# Patient Record
Sex: Female | Born: 1955
Health system: Southern US, Community
[De-identification: ages and names within clinical notes are randomized; demographics above are authoritative.]

## PROBLEM LIST (undated history)

## (undated) DIAGNOSIS — M109 Gout, unspecified: Secondary | ICD-10-CM

## (undated) DIAGNOSIS — I639 Cerebral infarction, unspecified: Secondary | ICD-10-CM

## (undated) DIAGNOSIS — M25569 Pain in unspecified knee: Secondary | ICD-10-CM

## (undated) DIAGNOSIS — F1910 Other psychoactive substance abuse, uncomplicated: Secondary | ICD-10-CM

## (undated) DIAGNOSIS — D649 Anemia, unspecified: Secondary | ICD-10-CM

## (undated) DIAGNOSIS — I10 Essential (primary) hypertension: Secondary | ICD-10-CM

## (undated) DIAGNOSIS — M1990 Unspecified osteoarthritis, unspecified site: Secondary | ICD-10-CM

## (undated) DIAGNOSIS — N289 Disorder of kidney and ureter, unspecified: Secondary | ICD-10-CM

## (undated) DIAGNOSIS — M25559 Pain in unspecified hip: Secondary | ICD-10-CM

## (undated) DIAGNOSIS — F329 Major depressive disorder, single episode, unspecified: Secondary | ICD-10-CM

## (undated) DIAGNOSIS — F32A Depression, unspecified: Secondary | ICD-10-CM

## (undated) DIAGNOSIS — R768 Other specified abnormal immunological findings in serum: Secondary | ICD-10-CM

## (undated) DIAGNOSIS — R296 Repeated falls: Secondary | ICD-10-CM

## (undated) DIAGNOSIS — F419 Anxiety disorder, unspecified: Secondary | ICD-10-CM

## (undated) DIAGNOSIS — K219 Gastro-esophageal reflux disease without esophagitis: Secondary | ICD-10-CM

## (undated) DIAGNOSIS — G8929 Other chronic pain: Secondary | ICD-10-CM

## (undated) HISTORY — PX: ABDOMINAL HYSTERECTOMY: SHX81

## (undated) HISTORY — PX: X-STOP IMPLANTATION: SHX2677

## (undated) HISTORY — PX: PARTIAL KNEE ARTHROPLASTY: SHX2174

## (undated) HISTORY — PX: TOTAL KNEE ARTHROPLASTY: SHX125

## (undated) HISTORY — PX: KNEE ARTHROSCOPY: SHX127

## (undated) HISTORY — PX: OTHER SURGICAL HISTORY: SHX169

---

## 1999-01-02 ENCOUNTER — Encounter: Payer: Self-pay | Admitting: Internal Medicine

## 1999-01-02 ENCOUNTER — Ambulatory Visit (HOSPITAL_COMMUNITY): Admission: RE | Admit: 1999-01-02 | Discharge: 1999-01-02 | Payer: Self-pay | Admitting: Internal Medicine

## 1999-01-11 ENCOUNTER — Ambulatory Visit (HOSPITAL_COMMUNITY): Admission: RE | Admit: 1999-01-11 | Discharge: 1999-01-11 | Payer: Self-pay | Admitting: *Deleted

## 1999-01-26 ENCOUNTER — Emergency Department (HOSPITAL_COMMUNITY): Admission: EM | Admit: 1999-01-26 | Discharge: 1999-01-26 | Payer: Self-pay | Admitting: Emergency Medicine

## 1999-03-25 ENCOUNTER — Emergency Department (HOSPITAL_COMMUNITY): Admission: EM | Admit: 1999-03-25 | Discharge: 1999-03-25 | Payer: Self-pay | Admitting: Emergency Medicine

## 1999-03-25 ENCOUNTER — Encounter: Payer: Self-pay | Admitting: Emergency Medicine

## 2002-01-05 ENCOUNTER — Emergency Department (HOSPITAL_COMMUNITY): Admission: EM | Admit: 2002-01-05 | Discharge: 2002-01-05 | Payer: Self-pay | Admitting: Internal Medicine

## 2002-04-04 ENCOUNTER — Emergency Department (HOSPITAL_COMMUNITY): Admission: EM | Admit: 2002-04-04 | Discharge: 2002-04-04 | Payer: Self-pay | Admitting: Emergency Medicine

## 2002-11-26 ENCOUNTER — Emergency Department (HOSPITAL_COMMUNITY): Admission: EM | Admit: 2002-11-26 | Discharge: 2002-11-26 | Payer: Self-pay | Admitting: Emergency Medicine

## 2003-02-06 ENCOUNTER — Emergency Department (HOSPITAL_COMMUNITY): Admission: EM | Admit: 2003-02-06 | Discharge: 2003-02-06 | Payer: Self-pay | Admitting: Emergency Medicine

## 2003-05-26 ENCOUNTER — Emergency Department (HOSPITAL_COMMUNITY): Admission: EM | Admit: 2003-05-26 | Discharge: 2003-05-26 | Payer: Self-pay | Admitting: *Deleted

## 2003-05-26 ENCOUNTER — Encounter: Payer: Self-pay | Admitting: *Deleted

## 2003-07-04 ENCOUNTER — Emergency Department (HOSPITAL_COMMUNITY): Admission: EM | Admit: 2003-07-04 | Discharge: 2003-07-04 | Payer: Self-pay

## 2004-08-08 ENCOUNTER — Emergency Department (HOSPITAL_COMMUNITY): Admission: EM | Admit: 2004-08-08 | Discharge: 2004-08-08 | Payer: Self-pay | Admitting: Emergency Medicine

## 2004-11-17 HISTORY — PX: OTHER SURGICAL HISTORY: SHX169

## 2004-12-25 ENCOUNTER — Emergency Department (HOSPITAL_COMMUNITY): Admission: EM | Admit: 2004-12-25 | Discharge: 2004-12-25 | Payer: Self-pay | Admitting: Emergency Medicine

## 2005-01-10 ENCOUNTER — Ambulatory Visit (HOSPITAL_COMMUNITY): Admission: RE | Admit: 2005-01-10 | Discharge: 2005-01-10 | Payer: Self-pay | Admitting: Orthopaedic Surgery

## 2005-05-07 ENCOUNTER — Ambulatory Visit (HOSPITAL_COMMUNITY): Admission: RE | Admit: 2005-05-07 | Discharge: 2005-05-07 | Payer: Self-pay | Admitting: Family Medicine

## 2005-06-04 ENCOUNTER — Ambulatory Visit (HOSPITAL_COMMUNITY): Admission: RE | Admit: 2005-06-04 | Discharge: 2005-06-04 | Payer: Self-pay | Admitting: Obstetrics and Gynecology

## 2005-06-16 ENCOUNTER — Emergency Department (HOSPITAL_COMMUNITY): Admission: EM | Admit: 2005-06-16 | Discharge: 2005-06-16 | Payer: Self-pay | Admitting: Emergency Medicine

## 2005-06-30 ENCOUNTER — Encounter: Payer: Self-pay | Admitting: Obstetrics and Gynecology

## 2005-06-30 ENCOUNTER — Inpatient Hospital Stay (HOSPITAL_COMMUNITY): Admission: RE | Admit: 2005-06-30 | Discharge: 2005-07-02 | Payer: Self-pay | Admitting: Obstetrics and Gynecology

## 2005-07-29 ENCOUNTER — Ambulatory Visit (HOSPITAL_COMMUNITY): Admission: RE | Admit: 2005-07-29 | Discharge: 2005-07-29 | Payer: Self-pay | Admitting: Orthopaedic Surgery

## 2005-07-29 ENCOUNTER — Emergency Department (HOSPITAL_COMMUNITY): Admission: EM | Admit: 2005-07-29 | Discharge: 2005-07-29 | Payer: Self-pay | Admitting: Emergency Medicine

## 2006-01-16 ENCOUNTER — Ambulatory Visit (HOSPITAL_COMMUNITY): Admission: RE | Admit: 2006-01-16 | Discharge: 2006-01-16 | Payer: Self-pay | Admitting: Orthopaedic Surgery

## 2006-04-04 ENCOUNTER — Emergency Department (HOSPITAL_COMMUNITY): Admission: EM | Admit: 2006-04-04 | Discharge: 2006-04-04 | Payer: Self-pay | Admitting: Emergency Medicine

## 2006-07-25 ENCOUNTER — Emergency Department (HOSPITAL_COMMUNITY): Admission: EM | Admit: 2006-07-25 | Discharge: 2006-07-25 | Payer: Self-pay | Admitting: Emergency Medicine

## 2006-08-04 ENCOUNTER — Ambulatory Visit (HOSPITAL_COMMUNITY): Admission: RE | Admit: 2006-08-04 | Discharge: 2006-08-04 | Payer: Self-pay | Admitting: Orthopaedic Surgery

## 2006-09-02 ENCOUNTER — Emergency Department (HOSPITAL_COMMUNITY): Admission: EM | Admit: 2006-09-02 | Discharge: 2006-09-02 | Payer: Self-pay | Admitting: Emergency Medicine

## 2006-10-07 ENCOUNTER — Emergency Department (HOSPITAL_COMMUNITY): Admission: EM | Admit: 2006-10-07 | Discharge: 2006-10-07 | Payer: Self-pay | Admitting: Emergency Medicine

## 2006-12-23 ENCOUNTER — Emergency Department (HOSPITAL_COMMUNITY): Admission: EM | Admit: 2006-12-23 | Discharge: 2006-12-23 | Payer: Self-pay | Admitting: Emergency Medicine

## 2007-02-11 ENCOUNTER — Emergency Department (HOSPITAL_COMMUNITY): Admission: EM | Admit: 2007-02-11 | Discharge: 2007-02-11 | Payer: Self-pay | Admitting: Emergency Medicine

## 2007-02-22 ENCOUNTER — Ambulatory Visit (HOSPITAL_COMMUNITY): Admission: RE | Admit: 2007-02-22 | Discharge: 2007-02-22 | Payer: Self-pay | Admitting: Orthopaedic Surgery

## 2007-03-10 ENCOUNTER — Encounter (INDEPENDENT_AMBULATORY_CARE_PROVIDER_SITE_OTHER): Payer: Self-pay | Admitting: Specialist

## 2007-03-11 ENCOUNTER — Ambulatory Visit (HOSPITAL_COMMUNITY): Admission: RE | Admit: 2007-03-11 | Discharge: 2007-03-11 | Payer: Self-pay | Admitting: Orthopaedic Surgery

## 2007-03-15 ENCOUNTER — Encounter (HOSPITAL_COMMUNITY): Admission: RE | Admit: 2007-03-15 | Discharge: 2007-04-14 | Payer: Self-pay | Admitting: Orthopaedic Surgery

## 2007-04-28 ENCOUNTER — Ambulatory Visit (HOSPITAL_COMMUNITY): Admission: RE | Admit: 2007-04-28 | Discharge: 2007-04-28 | Payer: Self-pay | Admitting: Orthopaedic Surgery

## 2007-05-08 ENCOUNTER — Emergency Department (HOSPITAL_COMMUNITY): Admission: EM | Admit: 2007-05-08 | Discharge: 2007-05-08 | Payer: Self-pay | Admitting: Emergency Medicine

## 2007-06-29 ENCOUNTER — Ambulatory Visit: Payer: Self-pay | Admitting: Family Medicine

## 2007-06-29 DIAGNOSIS — F329 Major depressive disorder, single episode, unspecified: Secondary | ICD-10-CM

## 2007-06-29 DIAGNOSIS — M129 Arthropathy, unspecified: Secondary | ICD-10-CM | POA: Insufficient documentation

## 2007-06-29 DIAGNOSIS — R5383 Other fatigue: Secondary | ICD-10-CM

## 2007-06-29 DIAGNOSIS — F32A Depression, unspecified: Secondary | ICD-10-CM | POA: Insufficient documentation

## 2007-06-29 DIAGNOSIS — F411 Generalized anxiety disorder: Secondary | ICD-10-CM | POA: Insufficient documentation

## 2007-06-29 DIAGNOSIS — I10 Essential (primary) hypertension: Secondary | ICD-10-CM | POA: Insufficient documentation

## 2007-06-29 DIAGNOSIS — K5900 Constipation, unspecified: Secondary | ICD-10-CM | POA: Insufficient documentation

## 2007-06-29 DIAGNOSIS — R5381 Other malaise: Secondary | ICD-10-CM

## 2007-06-30 ENCOUNTER — Telehealth (INDEPENDENT_AMBULATORY_CARE_PROVIDER_SITE_OTHER): Payer: Self-pay | Admitting: *Deleted

## 2007-06-30 LAB — CONVERTED CEMR LAB
ALT: 15 units/L (ref 0–35)
Albumin: 3.9 g/dL (ref 3.5–5.2)
Basophils Absolute: 0 10*3/uL (ref 0.0–0.1)
CO2: 23 meq/L (ref 19–32)
Calcium: 9.1 mg/dL (ref 8.4–10.5)
Chloride: 106 meq/L (ref 96–112)
Cholesterol: 152 mg/dL (ref 0–200)
Glucose, Bld: 98 mg/dL (ref 70–99)
HCT: 34.2 % — ABNORMAL LOW (ref 36.0–46.0)
Hemoglobin: 11 g/dL — ABNORMAL LOW (ref 12.0–15.0)
Lymphocytes Relative: 29 % (ref 12–46)
Lymphs Abs: 3.1 10*3/uL (ref 0.7–3.3)
Neutro Abs: 6.7 10*3/uL (ref 1.7–7.7)
Platelets: 255 10*3/uL (ref 150–400)
RDW: 14.5 % — ABNORMAL HIGH (ref 11.5–14.0)
Sodium: 141 meq/L (ref 135–145)
Total Protein: 7.6 g/dL (ref 6.0–8.3)
Triglycerides: 71 mg/dL (ref <150)
WBC: 10.8 10*3/uL — ABNORMAL HIGH (ref 4.0–10.5)

## 2007-07-02 ENCOUNTER — Encounter (INDEPENDENT_AMBULATORY_CARE_PROVIDER_SITE_OTHER): Payer: Self-pay | Admitting: Family Medicine

## 2007-07-07 ENCOUNTER — Telehealth (INDEPENDENT_AMBULATORY_CARE_PROVIDER_SITE_OTHER): Payer: Self-pay | Admitting: Family Medicine

## 2007-07-13 ENCOUNTER — Ambulatory Visit: Payer: Self-pay | Admitting: Family Medicine

## 2007-07-13 DIAGNOSIS — N182 Chronic kidney disease, stage 2 (mild): Secondary | ICD-10-CM | POA: Insufficient documentation

## 2007-07-13 DIAGNOSIS — F1010 Alcohol abuse, uncomplicated: Secondary | ICD-10-CM | POA: Insufficient documentation

## 2007-07-13 DIAGNOSIS — D649 Anemia, unspecified: Secondary | ICD-10-CM | POA: Insufficient documentation

## 2007-07-13 LAB — CONVERTED CEMR LAB
Cholesterol, target level: 200 mg/dL
HDL goal, serum: 40 mg/dL
LDL Goal: 160 mg/dL

## 2007-07-14 ENCOUNTER — Telehealth (INDEPENDENT_AMBULATORY_CARE_PROVIDER_SITE_OTHER): Payer: Self-pay | Admitting: *Deleted

## 2007-07-14 LAB — CONVERTED CEMR LAB
Basophils Absolute: 0 10*3/uL (ref 0.0–0.1)
Basophils Relative: 0 % (ref 0–1)
CO2: 23 meq/L (ref 19–32)
Calcium: 9.3 mg/dL (ref 8.4–10.5)
Creatinine, Ser: 1.49 mg/dL — ABNORMAL HIGH (ref 0.40–1.20)
Eosinophils Absolute: 0.2 10*3/uL (ref 0.0–0.7)
Ferritin: 180 ng/mL (ref 10–291)
Glucose, Bld: 93 mg/dL (ref 70–99)
Iron: 66 ug/dL (ref 42–145)
MCHC: 33.3 g/dL (ref 30.0–36.0)
MCV: 84.9 fL (ref 78.0–100.0)
Monocytes Relative: 7 % (ref 3–11)
Neutrophils Relative %: 60 % (ref 43–77)
Platelets: 207 10*3/uL (ref 150–400)
RBC: 3.92 M/uL (ref 3.87–5.11)
RDW: 14.5 % — ABNORMAL HIGH (ref 11.5–14.0)
Retic Count, Absolute: 62.7 (ref 19.0–186.0)
UIBC: 185 ug/dL

## 2007-07-15 ENCOUNTER — Encounter (INDEPENDENT_AMBULATORY_CARE_PROVIDER_SITE_OTHER): Payer: Self-pay | Admitting: Family Medicine

## 2007-07-24 ENCOUNTER — Emergency Department (HOSPITAL_COMMUNITY): Admission: EM | Admit: 2007-07-24 | Discharge: 2007-07-24 | Payer: Self-pay | Admitting: Emergency Medicine

## 2007-08-03 ENCOUNTER — Emergency Department (HOSPITAL_COMMUNITY): Admission: EM | Admit: 2007-08-03 | Discharge: 2007-08-03 | Payer: Self-pay | Admitting: Emergency Medicine

## 2007-08-27 ENCOUNTER — Encounter (INDEPENDENT_AMBULATORY_CARE_PROVIDER_SITE_OTHER): Payer: Self-pay | Admitting: Family Medicine

## 2007-11-06 ENCOUNTER — Emergency Department (HOSPITAL_COMMUNITY): Admission: EM | Admit: 2007-11-06 | Discharge: 2007-11-06 | Payer: Self-pay | Admitting: Emergency Medicine

## 2007-11-19 ENCOUNTER — Emergency Department (HOSPITAL_COMMUNITY): Admission: EM | Admit: 2007-11-19 | Discharge: 2007-11-19 | Payer: Self-pay | Admitting: Emergency Medicine

## 2007-12-03 ENCOUNTER — Ambulatory Visit: Payer: Self-pay | Admitting: Family Medicine

## 2007-12-03 ENCOUNTER — Telehealth (INDEPENDENT_AMBULATORY_CARE_PROVIDER_SITE_OTHER): Payer: Self-pay | Admitting: *Deleted

## 2007-12-03 DIAGNOSIS — R1310 Dysphagia, unspecified: Secondary | ICD-10-CM | POA: Insufficient documentation

## 2007-12-06 ENCOUNTER — Encounter (INDEPENDENT_AMBULATORY_CARE_PROVIDER_SITE_OTHER): Payer: Self-pay | Admitting: Family Medicine

## 2007-12-17 ENCOUNTER — Ambulatory Visit: Payer: Self-pay | Admitting: Family Medicine

## 2007-12-17 DIAGNOSIS — E669 Obesity, unspecified: Secondary | ICD-10-CM | POA: Insufficient documentation

## 2007-12-20 ENCOUNTER — Encounter (INDEPENDENT_AMBULATORY_CARE_PROVIDER_SITE_OTHER): Payer: Self-pay | Admitting: Family Medicine

## 2007-12-22 ENCOUNTER — Ambulatory Visit: Payer: Self-pay | Admitting: Gastroenterology

## 2008-02-15 ENCOUNTER — Emergency Department (HOSPITAL_COMMUNITY): Admission: EM | Admit: 2008-02-15 | Discharge: 2008-02-15 | Payer: Self-pay | Admitting: Emergency Medicine

## 2008-05-01 ENCOUNTER — Emergency Department (HOSPITAL_COMMUNITY): Admission: EM | Admit: 2008-05-01 | Discharge: 2008-05-01 | Payer: Self-pay | Admitting: Emergency Medicine

## 2008-05-01 ENCOUNTER — Encounter (INDEPENDENT_AMBULATORY_CARE_PROVIDER_SITE_OTHER): Payer: Self-pay | Admitting: Family Medicine

## 2008-06-06 ENCOUNTER — Emergency Department (HOSPITAL_COMMUNITY): Admission: EM | Admit: 2008-06-06 | Discharge: 2008-06-06 | Payer: Self-pay | Admitting: Emergency Medicine

## 2008-07-31 ENCOUNTER — Telehealth (INDEPENDENT_AMBULATORY_CARE_PROVIDER_SITE_OTHER): Payer: Self-pay | Admitting: Family Medicine

## 2008-10-16 ENCOUNTER — Ambulatory Visit: Payer: Self-pay | Admitting: Family Medicine

## 2008-10-16 LAB — CONVERTED CEMR LAB

## 2008-10-17 LAB — CONVERTED CEMR LAB
ALT: 16 units/L (ref 0–35)
AST: 15 units/L (ref 0–37)
Alkaline Phosphatase: 78 units/L (ref 39–117)
Basophils Absolute: 0.1 10*3/uL (ref 0.0–0.1)
Basophils Relative: 1 % (ref 0–1)
Creatinine, Ser: 1.67 mg/dL — ABNORMAL HIGH (ref 0.40–1.20)
Eosinophils Relative: 2 % (ref 0–5)
HCT: 37.4 % (ref 36.0–46.0)
Hemoglobin: 11.7 g/dL — ABNORMAL LOW (ref 12.0–15.0)
MCHC: 31.3 g/dL (ref 30.0–36.0)
Monocytes Absolute: 0.6 10*3/uL (ref 0.1–1.0)
RDW: 14.3 % (ref 11.5–15.5)
TSH: 2.648 microintl units/mL (ref 0.350–4.50)
Total Bilirubin: 0.6 mg/dL (ref 0.3–1.2)
Total CHOL/HDL Ratio: 2.1
VLDL: 13 mg/dL (ref 0–40)

## 2008-10-27 ENCOUNTER — Encounter (INDEPENDENT_AMBULATORY_CARE_PROVIDER_SITE_OTHER): Payer: Self-pay | Admitting: Family Medicine

## 2008-10-30 ENCOUNTER — Ambulatory Visit: Payer: Self-pay | Admitting: Family Medicine

## 2008-10-31 LAB — CONVERTED CEMR LAB
Iron: 75 ug/dL (ref 42–145)
Retic Ct Pct: 1.2 % (ref 0.4–3.1)
Saturation Ratios: 28 % (ref 20–55)
TIBC: 267 ug/dL (ref 250–470)
UIBC: 192 ug/dL

## 2008-11-02 ENCOUNTER — Encounter (INDEPENDENT_AMBULATORY_CARE_PROVIDER_SITE_OTHER): Payer: Self-pay | Admitting: Family Medicine

## 2008-11-06 ENCOUNTER — Ambulatory Visit (HOSPITAL_COMMUNITY): Admission: RE | Admit: 2008-11-06 | Discharge: 2008-11-06 | Payer: Self-pay | Admitting: Family Medicine

## 2008-11-27 ENCOUNTER — Ambulatory Visit: Payer: Self-pay | Admitting: Family Medicine

## 2008-12-07 ENCOUNTER — Telehealth (INDEPENDENT_AMBULATORY_CARE_PROVIDER_SITE_OTHER): Payer: Self-pay | Admitting: *Deleted

## 2008-12-08 ENCOUNTER — Ambulatory Visit: Payer: Self-pay | Admitting: Family Medicine

## 2008-12-08 ENCOUNTER — Ambulatory Visit (HOSPITAL_COMMUNITY): Admission: RE | Admit: 2008-12-08 | Discharge: 2008-12-08 | Payer: Self-pay | Admitting: Family Medicine

## 2009-01-15 ENCOUNTER — Encounter (INDEPENDENT_AMBULATORY_CARE_PROVIDER_SITE_OTHER): Payer: Self-pay | Admitting: Family Medicine

## 2009-05-28 ENCOUNTER — Ambulatory Visit: Payer: Self-pay | Admitting: Family Medicine

## 2009-05-28 DIAGNOSIS — M79609 Pain in unspecified limb: Secondary | ICD-10-CM

## 2009-05-29 ENCOUNTER — Ambulatory Visit (HOSPITAL_COMMUNITY): Admission: RE | Admit: 2009-05-29 | Discharge: 2009-05-29 | Payer: Self-pay | Admitting: Orthopaedic Surgery

## 2009-05-30 ENCOUNTER — Ambulatory Visit: Payer: Self-pay | Admitting: Family Medicine

## 2009-05-30 LAB — CONVERTED CEMR LAB
ALT: 21 units/L (ref 0–35)
Albumin: 3.8 g/dL (ref 3.5–5.2)
CO2: 19 meq/L (ref 19–32)
Calcium: 9.3 mg/dL (ref 8.4–10.5)
Chloride: 106 meq/L (ref 96–112)
Creatinine, Ser: 1.64 mg/dL — ABNORMAL HIGH (ref 0.40–1.20)
Eosinophils Absolute: 0.1 10*3/uL (ref 0.0–0.7)
HCT: 33 % — ABNORMAL LOW (ref 36.0–46.0)
Lymphs Abs: 2.7 10*3/uL (ref 0.7–4.0)
MCV: 86.4 fL (ref 78.0–100.0)
Monocytes Relative: 8 % (ref 3–12)
Neutrophils Relative %: 61 % (ref 43–77)
Potassium: 4 meq/L (ref 3.5–5.3)
RBC: 3.82 M/uL — ABNORMAL LOW (ref 3.87–5.11)
Total Protein: 7 g/dL (ref 6.0–8.3)
WBC: 8.8 10*3/uL (ref 4.0–10.5)

## 2009-06-12 ENCOUNTER — Ambulatory Visit: Payer: Self-pay | Admitting: Family Medicine

## 2009-06-12 DIAGNOSIS — K219 Gastro-esophageal reflux disease without esophagitis: Secondary | ICD-10-CM | POA: Insufficient documentation

## 2009-06-12 DIAGNOSIS — R079 Chest pain, unspecified: Secondary | ICD-10-CM

## 2009-06-13 ENCOUNTER — Encounter (INDEPENDENT_AMBULATORY_CARE_PROVIDER_SITE_OTHER): Payer: Self-pay | Admitting: *Deleted

## 2009-06-25 ENCOUNTER — Emergency Department (HOSPITAL_COMMUNITY): Admission: EM | Admit: 2009-06-25 | Discharge: 2009-06-25 | Payer: Self-pay | Admitting: Emergency Medicine

## 2009-09-26 ENCOUNTER — Ambulatory Visit (HOSPITAL_COMMUNITY): Admission: RE | Admit: 2009-09-26 | Discharge: 2009-09-26 | Payer: Self-pay | Admitting: Internal Medicine

## 2009-10-15 ENCOUNTER — Emergency Department (HOSPITAL_COMMUNITY): Admission: EM | Admit: 2009-10-15 | Discharge: 2009-10-15 | Payer: Self-pay | Admitting: Emergency Medicine

## 2009-10-29 ENCOUNTER — Encounter (INDEPENDENT_AMBULATORY_CARE_PROVIDER_SITE_OTHER): Payer: Self-pay | Admitting: Internal Medicine

## 2009-10-29 ENCOUNTER — Ambulatory Visit: Payer: Self-pay | Admitting: Cardiology

## 2009-10-29 ENCOUNTER — Ambulatory Visit (HOSPITAL_COMMUNITY): Admission: RE | Admit: 2009-10-29 | Discharge: 2009-10-29 | Payer: Self-pay | Admitting: Internal Medicine

## 2009-12-05 ENCOUNTER — Inpatient Hospital Stay (HOSPITAL_COMMUNITY): Admission: EM | Admit: 2009-12-05 | Discharge: 2009-12-09 | Payer: Self-pay | Admitting: Emergency Medicine

## 2010-04-09 ENCOUNTER — Emergency Department (HOSPITAL_COMMUNITY): Admission: EM | Admit: 2010-04-09 | Discharge: 2010-04-09 | Payer: Self-pay | Admitting: Emergency Medicine

## 2010-05-09 ENCOUNTER — Emergency Department (HOSPITAL_COMMUNITY): Admission: EM | Admit: 2010-05-09 | Discharge: 2010-05-09 | Payer: Self-pay | Admitting: Emergency Medicine

## 2010-05-22 ENCOUNTER — Ambulatory Visit (HOSPITAL_COMMUNITY): Admission: RE | Admit: 2010-05-22 | Discharge: 2010-05-22 | Payer: Self-pay | Admitting: Orthopaedic Surgery

## 2010-06-19 ENCOUNTER — Encounter (HOSPITAL_COMMUNITY): Admission: RE | Admit: 2010-06-19 | Discharge: 2010-07-19 | Payer: Self-pay | Admitting: Orthopaedic Surgery

## 2010-07-23 ENCOUNTER — Encounter (HOSPITAL_COMMUNITY): Admission: RE | Admit: 2010-07-23 | Discharge: 2010-08-22 | Payer: Self-pay | Admitting: Orthopaedic Surgery

## 2010-08-01 ENCOUNTER — Emergency Department (HOSPITAL_COMMUNITY): Admission: EM | Admit: 2010-08-01 | Discharge: 2010-08-01 | Payer: Self-pay | Admitting: Emergency Medicine

## 2010-12-08 ENCOUNTER — Encounter: Payer: Self-pay | Admitting: Obstetrics and Gynecology

## 2010-12-15 LAB — CONVERTED CEMR LAB: OCCULT 1: POSITIVE

## 2011-01-29 ENCOUNTER — Emergency Department (HOSPITAL_COMMUNITY): Payer: Medicaid Other

## 2011-01-29 ENCOUNTER — Emergency Department (HOSPITAL_COMMUNITY)
Admission: EM | Admit: 2011-01-29 | Discharge: 2011-01-29 | Disposition: A | Discharge status: Home / Self Care | Payer: Medicaid Other | Attending: Emergency Medicine | Admitting: Emergency Medicine

## 2011-01-29 DIAGNOSIS — M25579 Pain in unspecified ankle and joints of unspecified foot: Secondary | ICD-10-CM | POA: Insufficient documentation

## 2011-01-29 DIAGNOSIS — M722 Plantar fascial fibromatosis: Secondary | ICD-10-CM | POA: Insufficient documentation

## 2011-02-02 LAB — UIFE/LIGHT CHAINS/TP QN, 24-HR UR
Alpha 1, Urine: DETECTED — AB
Alpha 2, Urine: DETECTED — AB
Free Kappa/Lambda Ratio: 9.16 ratio — ABNORMAL HIGH (ref 0.46–4.00)
Free Lambda Excretion/Day: 60.45 mg/d
Free Lt Chn Excr Rate: 553.8 mg/d
Time: 24 hours
Total Protein, Urine: 16.9 mg/dL
Volume, Urine: 3900 mL

## 2011-02-02 LAB — BASIC METABOLIC PANEL
BUN: 19 mg/dL (ref 6–23)
BUN: 28 mg/dL — ABNORMAL HIGH (ref 6–23)
CO2: 24 mEq/L (ref 19–32)
Calcium: 8.7 mg/dL (ref 8.4–10.5)
Calcium: 8.7 mg/dL (ref 8.4–10.5)
Calcium: 8.8 mg/dL (ref 8.4–10.5)
Calcium: 9 mg/dL (ref 8.4–10.5)
Chloride: 105 mEq/L (ref 96–112)
Creatinine, Ser: 1.97 mg/dL — ABNORMAL HIGH (ref 0.4–1.2)
Creatinine, Ser: 2.03 mg/dL — ABNORMAL HIGH (ref 0.4–1.2)
Creatinine, Ser: 2.27 mg/dL — ABNORMAL HIGH (ref 0.4–1.2)
GFR calc Af Amer: 31 mL/min — ABNORMAL LOW (ref >60)
GFR calc Af Amer: 32 mL/min — ABNORMAL LOW (ref >60)
GFR calc non Af Amer: 22 mL/min — ABNORMAL LOW (ref >60)
GFR calc non Af Amer: 26 mL/min — ABNORMAL LOW (ref >60)
GFR calc non Af Amer: 29 mL/min — ABNORMAL LOW (ref >60)
Glucose, Bld: 106 mg/dL — ABNORMAL HIGH (ref 70–99)
Glucose, Bld: 111 mg/dL — ABNORMAL HIGH (ref 70–99)
Glucose, Bld: 130 mg/dL — ABNORMAL HIGH (ref 70–99)
Glucose, Bld: 96 mg/dL (ref 70–99)
Potassium: 3.4 mEq/L — ABNORMAL LOW (ref 3.5–5.1)
Potassium: 3.7 mEq/L (ref 3.5–5.1)
Sodium: 137 mEq/L (ref 135–145)
Sodium: 137 mEq/L (ref 135–145)

## 2011-02-02 LAB — URINALYSIS, ROUTINE W REFLEX MICROSCOPIC
Bilirubin Urine: NEGATIVE
Nitrite: NEGATIVE
Protein, ur: 30 mg/dL — AB
Specific Gravity, Urine: 1.005 (ref 1.005–1.030)
Urobilinogen, UA: 1 mg/dL (ref 0.0–1.0)

## 2011-02-02 LAB — LIPASE, BLOOD: Lipase: 14 U/L (ref 11–59)

## 2011-02-02 LAB — CBC
HCT: 27.3 % — ABNORMAL LOW (ref 36.0–46.0)
HCT: 27.6 % — ABNORMAL LOW (ref 36.0–46.0)
HCT: 29.6 % — ABNORMAL LOW (ref 36.0–46.0)
Hemoglobin: 9.1 g/dL — ABNORMAL LOW (ref 12.0–15.0)
Hemoglobin: 9.8 g/dL — ABNORMAL LOW (ref 12.0–15.0)
MCHC: 32.3 g/dL (ref 30.0–36.0)
MCHC: 33.2 g/dL (ref 30.0–36.0)
MCV: 86.1 fL (ref 78.0–100.0)
MCV: 86.3 fL (ref 78.0–100.0)
MCV: 86.6 fL (ref 78.0–100.0)
Platelets: 149 10*3/uL — ABNORMAL LOW (ref 150–400)
Platelets: 166 10*3/uL (ref 150–400)
Platelets: 200 10*3/uL (ref 150–400)
Platelets: 227 10*3/uL (ref 150–400)
RBC: 3.44 MIL/uL — ABNORMAL LOW (ref 3.87–5.11)
RDW: 14.3 % (ref 11.5–15.5)
RDW: 15.1 % (ref 11.5–15.5)
RDW: 15.3 % (ref 11.5–15.5)
RDW: 15.4 % (ref 11.5–15.5)
WBC: 6.7 10*3/uL (ref 4.0–10.5)
WBC: 7.5 10*3/uL (ref 4.0–10.5)
WBC: 9.1 10*3/uL (ref 4.0–10.5)

## 2011-02-02 LAB — DIFFERENTIAL
Basophils Absolute: 0 10*3/uL (ref 0.0–0.1)
Basophils Absolute: 0 10*3/uL (ref 0.0–0.1)
Basophils Absolute: 0 10*3/uL (ref 0.0–0.1)
Basophils Absolute: 0 10*3/uL (ref 0.0–0.1)
Basophils Absolute: 0 10*3/uL (ref 0.0–0.1)
Basophils Relative: 0 % (ref 0–1)
Basophils Relative: 0 % (ref 0–1)
Eosinophils Absolute: 0.1 10*3/uL (ref 0.0–0.7)
Eosinophils Absolute: 0.1 10*3/uL (ref 0.0–0.7)
Eosinophils Absolute: 0.1 10*3/uL (ref 0.0–0.7)
Eosinophils Relative: 1 % (ref 0–5)
Eosinophils Relative: 1 % (ref 0–5)
Lymphocytes Relative: 16 % (ref 12–46)
Lymphocytes Relative: 20 % (ref 12–46)
Lymphocytes Relative: 25 % (ref 12–46)
Lymphocytes Relative: 38 % (ref 12–46)
Lymphs Abs: 1.8 10*3/uL (ref 0.7–4.0)
Lymphs Abs: 1.8 10*3/uL (ref 0.7–4.0)
Lymphs Abs: 2.5 10*3/uL (ref 0.7–4.0)
Monocytes Absolute: 0.9 10*3/uL (ref 0.1–1.0)
Monocytes Relative: 8 % (ref 3–12)
Neutro Abs: 3.5 10*3/uL (ref 1.7–7.7)
Neutro Abs: 4.3 10*3/uL (ref 1.7–7.7)
Neutro Abs: 6.2 10*3/uL (ref 1.7–7.7)
Neutrophils Relative %: 59 % (ref 43–77)
Neutrophils Relative %: 69 % (ref 43–77)
Neutrophils Relative %: 75 % (ref 43–77)

## 2011-02-02 LAB — URINE MICROSCOPIC-ADD ON

## 2011-02-02 LAB — COMPREHENSIVE METABOLIC PANEL
CO2: 23 mEq/L (ref 19–32)
Calcium: 8.8 mg/dL (ref 8.4–10.5)
Chloride: 102 mEq/L (ref 96–112)
Creatinine, Ser: 2.65 mg/dL — ABNORMAL HIGH (ref 0.4–1.2)
GFR calc non Af Amer: 19 mL/min — ABNORMAL LOW (ref >60)
Glucose, Bld: 122 mg/dL — ABNORMAL HIGH (ref 70–99)
Total Bilirubin: 1 mg/dL (ref 0.3–1.2)

## 2011-02-02 LAB — COMPLEMENT, TOTAL: Compl, Total (CH50): >60 U/mL — ABNORMAL HIGH (ref 31–60)

## 2011-02-02 LAB — URINE CULTURE: Colony Count: >=100000

## 2011-02-02 LAB — FOLATE: Folate: >20 ng/mL

## 2011-02-02 LAB — VITAMIN B12: Vitamin B-12: 292 pg/mL (ref 211–911)

## 2011-02-02 LAB — FERRITIN
Ferritin: 158 ng/mL (ref 10–291)
Ferritin: 165 ng/mL (ref 10–291)

## 2011-02-02 LAB — AMMONIA: Ammonia: 24 umol/L (ref 11–35)

## 2011-04-01 NOTE — Consult Note (Signed)
[name], Nunez               ACCOUNT NO.:  [id number]   MEDICAL RECORD NO.:  [id number]          PATIENT TYPE:  AMB   LOCATION:  DAY                           FACILITY:  [facility]   PHYSICIAN:  Colleen Nunez, M.D.      DATE OF BIRTH:  22-Nov-1955   DATE OF CONSULTATION:  12/22/2007  DATE OF DISCHARGE:                                 CONSULTATION   REASON FOR CONSULTATION:  Difficulty swallowing.   PHYSICIAN REQUESTING CONSULTATION:  Weston Settle, M.D.   HISTORY OF PRESENT ILLNESS:  Colleen Nunez is a 55 year old lady who presents  for further evaluation of difficulty swallowing and GERD.  She has noted  over the last several weeks difficulty swallowing pills.  She has to  break them in half.  She has also been having quite a bit of pain behind  her breast bone.  She presented to the emergency department on January 2  with these complaints.  Cardiac enzymes x3 were negative.  D-dimer was  negative, creatinine was 1.47.  White count was 11,600, hemoglobin 11.9,  platelets 241,000.  Chest x-ray revealed cardiomegaly and mild  peribronchial thickening without focal air space disease.  She  apparently had a somewhat abnormal EKG with age-undetermined inferior  infarct.  She has an appointment which is scheduled next week at the  cardiologist.  She was placed on Aciphex 20 mg daily.  She states that  she has a lot of soreness and pain in her substernal region.  She notes  she cannot drink cold liquids because it seems to make the pain  intensify.  She denies any nausea or vomiting.  She rarely has abdominal  pain.  Occasionally, she has a short-lived pinprick-type sensation in  the left upper quadrant.  She has taken Amitiza as needed for  constipation with good results.  She has noted some dark-red blood on  the toilet tissue on occasion when she is constipated.  She never had a  colonoscopy.  Denies any melena.  She also complains of early satiety  and bloating postprandially.   CURRENT MEDICATIONS:  1. Hydrocodone/APAP 7.5/650 mg q.4 hours p.r.n.  2,  Benazepril 20 mg daily.  1. Norvasc 10 mg daily.  2. Naproxen 500 mg b.i.d. p.r.n.  3. Penicillin VK 500 mg t.i.d.  4. Hydrocodone q.12 hours p.r.n.  5. Aciphex 20 mg daily.  6. Amitiza 24 mcg daily.   ALLERGIES:  ASPIRIN.  APPARENTLY, SHE TOOK AN OVERDOSE PREVIOUSLY ON  ASPIRIN AND IS AFRAID TO TAKE IT NOW.   PAST MEDICAL HISTORY:  1. Atypical chest pain.  2. Normocytic anemia.  3. Renal insufficiency.  4. History of alcohol abuse.  5. Arthritis.  6. Degenerative joint disease.  7. Hypertension.  8. Depression.  9. Anxiety.  10.GERD.  11.History of hepatitis C based on hepatitis C antibody being positive      as best can be determined from available records.  She has never      had any kind of extensive evaluation.  12.History of previous suicidal attempts x2.  Denies any suicidal      ideations  at this time.  She has been in [place] twice.  Currently      not following up with mental health.  13.She has had a hysterectomy.  14.In 2006, she had a bilateral salpingo-oophorectomy with      appendectomy for a large right ovarian fibroma by Dr. Glo Herring.  15.She had a left knee cartilage repair in 2008.   FAMILY HISTORY:  Mother died at 45, had diabetes and was on dialysis.  Father died at age 39 with alcohol-related illness.  One sister died in  an MVA.  No family history of colorectal cancer to our knowledge.   SOCIAL HISTORY:  She is single.  She is on disability.  She never smoked  cigarettes.  She did drugs for a couple of years but quit four years  ago.  Never did IV or intranasal drug use but smoked crack.  She states  she was an alcoholic about seven years ago but only drinks socially at  this time.  According to Dr. Burnard Hawthorne note, she admits to drinking one  beer daily and two on the weekends.   REVIEW OF SYSTEMS:  See HPI for GI.  CONSTITUTIONAL:  Denies any weight  loss.   CARDIOPULMONARY:  See HPI.  Denies any shortness of breath or  cough.  GENITOURINARY:  Denies any dysuria or hematuria.   PHYSICAL EXAMINATION:  VITAL SIGNS:  Weight 213.5, height 5 foot 2,  temperature 97.8, blood pressure 104/84, pulse 80.  GENERAL:  Pleasant, obese black female in no acute distress.  SKIN:  Warm and dry, no jaundice.  HEENT:  Sclerae nonicteric.  Oropharyngeal mucosa moist and pink.  No  lesions, erythema, or exudate.  No lymphadenopathy or thyromegaly.  CHEST:  Lungs are clear to auscultation.  CARDIAC EXAM:  Reveals a regular rate and rhythm, normal S1, S2, no  murmurs,rubs or gallops.  ABDOMEN:  Positive bowel sounds.  Abdomen is very soft, obese,  nontender, no organomegaly or masses, no rebound or guarding, no  abdominal bruits or hernias.  LOWER EXTREMITIES:  No edema.   LABORATORY DATA:  As above.  In addition, looking back through EMR, she  had a hepatic profile panel done on March 09, 2007.  Her total bilirubin  was 0.5, alkaline phosphatase 74, AST 18, ALT 22, albumin 3.3.   IMPRESSION:  The patient is a 55 year old lady who presents with  dysphagia and GERD.  She is on naproxen and she has a history of alcohol  abuse.  She denies any significant alcohol consumption at this time, so  she may have erosive reflux esophagitis, esophageal stricture, cannot  exclude peptic ulcer disease due to NSAIDs.  I recommend  esophagogastroduodenoscopy to further evaluate her symptoms.  In  addition, she has never had a colonoscopy.  She also has noted on  occasion some blood on the toilet tissue with the constipation.  Bowel  movements are now more regular.  Would suggest she pursue colonoscopy at  some point in the near future after her dysphagia is under better  control.  She also has a history of being a hepatitis C carrier.  Based  on the EMR records, she apparently had a positive hepatitis C antibody  around 2006.  She denies having any type of workup.  Given her  history  of severe depression, suicidal ideations, plus ongoing alcohol use, she  is not felt to be a candidate for antiviral therapy at any rate.  She  was quite adamant today that she  did not want any of her information  provided to any family members, specifically her two sons.  She also  does not really want to pursue any type of further workup for the  hepatitis C but is interested in seeing how her liver function is.   PLAN:  1. EGD with esophageal dilation with Dr. Stann Mainland in the near future.  2. She has signed a HIPAA restriction form today.  3. Would offer a colonoscopy at some point in the near future.  At      that time, I could discuss with her any further hepatitis C workup.      I would offer at least the HCV RNA to determine whether or not she      has active viremia.   I would like to thank Dr. Jonna Munro for allowing [place] to take part in the  care of this patient.   ADDENDUM:  OPV w/ LL in 8 weeks for dysphagia & to consider TCS.      Neil Crouch, P.A.      Colleen Nunez, M.D.  Electronically Signed    LL/MEDQ  D:  12/22/2007  T:  12/23/2007  Job:  [id number]   cc:   Weston Settle, M.D.

## 2011-04-04 NOTE — H&P (Signed)
NAMELILLEY, Colleen Nunez               ACCOUNT NO.:  [id number]   MEDICAL RECORD NO.:  [id number]          PATIENT TYPE:  AMB   LOCATION:  DAY                           FACILITY:  [facility]   PHYSICIAN:  J. Sanjuana Kava, M.D. DATE OF BIRTH:  1956-10-16   DATE OF ADMISSION:  DATE OF DISCHARGE:  LH                              HISTORY & PHYSICAL   CHIEF COMPLAINT:  My knee hurts on the left.   HISTORY OF PRESENT ILLNESS:  The patient is a 55 year old female with  pain and tenderness in her left knee for several years.  It has gotten  progressively worse.  I have been following her for her left knee for  several years on and off.  She has had giving away of the knee.  She has  had injections in the past, prednisone.  The knee has progressively  gotten worse.  She was seen in the emergency room several times because  of knee pain.  She also has chronic history of low back pain.  Her knee  has progressively gotten worse on the left.  She was seen recently in  the emergency room on the 27th with increasing pain and tenderness in  her knee.  I recommended MRI of the knee and this was done on April 7,  which showed a significant joint effusion, Baker's cyst was unruptured,  advanced degenerative disease to the lateral compartment with an  extensive lateral meniscal tear.  There was mild chondromalacia, no  medial meniscal tear.  She has some tendonosis of the distal quadriceps  tendon.  The patient was informed of the findings.  She has had locking  of the knee to continue.  She thought about it for awhile and came back  to [place] on March 08, 2007 and wanted to have the surgery scheduled at this  time.  Risks and imponderables of the procedure were discussed with the  patient, details and appeared to understand the procedures outlined.   PAST HISTORY:  The patient has a history of hypertension, circulatory  problems and,in the past, she had an addiction to crack cocaine.   She is allergic to  CODEINE.   1. She is currently taking Vicodin, which she can tolerate.  2. She also takes ibuprofen 800 mg t.i.d.  3. Alprazolam 1 mg 3 times a day.  4. Metronidazole 500 mg twice a day.  5. Amitiza 1 a day.  6. Amlodipine 10 mg daily.    The patient does not smoke.  The patient uses alcoholic beverages.  She  does not list a family physician.  The patient is single and lives in  [place].   She had a C-section twice, once in 1979 and once in 1981 and  hysterectomy 2 years ago by Dr. Glo Herring.   Her mother and father are deceased.  Father died of cancer, Mother had  significant diabetes and heart disease.   VITAL SIGNS:  Normal.  GENERAL:  She is alert, cooperative.  HEENT:  Noted.  NECK:  Supple.  LUNGS:  Clear to P&A.  HEART:  Regular rate and rhythm  without murmur heard.  ABDOMEN:  Soft, nontender without masses.  EXTREMITIES:  She has pain and cyanosis in her left knee with an  effusion.  Pain and tenderness lateral joint line.  Positive McMurray  laterally.  Other extremities within normal limits.  CNS:  Intact.  SKIN:  Intact.   IMPRESSION:  1. Tear in lateral meniscus left knee.  2. History of hypertension.   Discussed with the patient planned procedure, risks and imponderables.  Appears to understand and agree with the procedure as outlined.  Labs  are pending.                                            ______________________________  J. Sanjuana Kava, M.D.     JWK/MEDQ  D:  03/10/2007  T:  03/10/2007  Job:  [id number]

## 2011-04-04 NOTE — Op Note (Signed)
NAMEJEZELL, SHA NO.:  [id number]   MEDICAL RECORD NO.:  [id number]          PATIENT TYPE:  INP   LOCATION:  [id number]                          FACILITY:  [facility]   PHYSICIAN:  Jonnie Kind, M.D. DATE OF BIRTH:  1955/12/15   DATE OF PROCEDURE:  06/30/2005  DATE OF DISCHARGE:  07/02/2005                                 OPERATIVE REPORT   PREOPERATIVE DIAGNOSIS:  Pelvic mass.   POSTOPERATIVE DIAGNOSIS:  Right ovarian fibroma with internal necrosis.   OPERATION PERFORMED:  Bilateral salpingo-oophorectomy and appendectomy.   SURGEON:  Jonnie Kind, M.D.   ASSISTANTLoreli Slot, RN   ANESTHESIA:  General.   COMPLICATIONS:  None.   ESTIMATED BLOOD LOSS:  100 mL.   FINDINGS:  Large right ovarian fibroma growing from the distal portion of  the otherwise normal ovary.  Large appendix greater than 10 gm in length  with erythema and some small amount of periappendiceal adhesions.   SPECIMENS TO LAB:  Ovary and tubes bilaterally, appendix and cicatrix.   DESCRIPTION OF PROCEDURE:  The patient was taken to the operating room,  prepped and draped for lower abdominal surgery.  The previous midline  vertical incision  was excised with some of the fibrotic retraction removed.  Specimen was approximately 8 cm wide at maximum diameter and reached  from  the panniculus crease to the umbilicus.  The fascia was entered in the  midline and the peritoneal cavity identified and suctioned any of the  peritoneal fluid which was held for possible cytology.  The pelvic was  inspected and the right ovary was found to have a huge fibroma attached to  it.  It had benign smooth surfaces. Omentum was palpated and normal. Upper  abdomen was inspected and was visually normal.  Bowel was packed away and  attention directed to the right tube and ovary.  We were careful not to  disrupt the large tumor and proceeded to identify the remnants of the round  ligament on the right, doubly  clamped, cut and suture ligated it, then  entered the retroperitoneum sufficiently to isolate the infundibulopelvic  ligament and ensure that the ureter was well out of the surgical field.  The  infundibulopelvic ligament was then isolated, clamped, cut and suture  ligated. Remnants of peritoneal tissues were excised and specimen sent for  frozen section. Subsequently, this returned benign showing suspected fibroma  with central necrosis.  The opposite tube and ovary were normal in  appearance, attached to the left side wall and were taken out in similar  fashion and sent to the lab.  This was as a routine specimen.  The pelvis  was inspected and found hemostatic.  Laparotomy equipment was left in place.  Laparotomy tapes removed and upper abdomen and bowel inspected.  No evidence  of trauma or abnormalities identified other than that the appendix was  unusually long with erythematous surfaces.  It was greater than 10 cm in  length, large diameter, so we decided to be prudent and remove the appendix.  The appendiceal mesentery was interrupted in three segments, clamped,  cut  and suture ligated.  The appendiceal stump was cross-clamped with two Kelly  clamps, transected, specimen passed off and stump ligated with 0 chromic.  The small tiny stump was then imbricated with a suture of 2-0 chromic.  The  patient had the pelvis irrigated some more in this area.  No Bovie cautery  was used on the appendiceal stump. We then proceeded to inspect again for  hemostasis, confirmed none, closed the anterior peritoneum with 2-0 chromic,  closed the fascia with 0 Vicryl.  Closed the subcutaneous tissues with 2-0  plain and staple closure used to close the skin.  The patient tolerated the  procedure well, went to recovery room in good condition.      Jonnie Kind, M.D.  Electronically Signed     JVF/MEDQ  D:  07/12/2005  T:  07/14/2005  Job:  [id number]

## 2011-04-04 NOTE — Op Note (Signed)
[name], Colleen Nunez               ACCOUNT NO.:  [id number]   MEDICAL RECORD NO.:  [id number]          PATIENT TYPE:  AMB   LOCATION:  DAY                           FACILITY:  [facility]   PHYSICIAN:  J. Sanjuana Kava, M.D. DATE OF BIRTH:  1956-01-17   DATE OF PROCEDURE:  03/11/2007  DATE OF DISCHARGE:                               OPERATIVE REPORT   PREOPERATIVE DIAGNOSIS:  Tear left knee lateral meniscus.   POSTOPERATIVE DIAGNOSIS:  Tear left knee lateral meniscus plus  degenerative joint disease.   PROCEDURE:  1. Operative arthroscopy of the left knee.  2. Partial lateral meniscectomy.   ANESTHESIA:  General.   TOURNIQUET TIME:  19 minutes.   DRAINS:  None.   SURGEON:  J. Sanjuana Kava, M.D.   INDICATIONS:  The patient is a 55 year old female with pain and  tenderness in the left knee with locking.  She had a constant MRI  showing a care in the lateral meniscus.  She is not improved with  conservative treatment; and surgery has been recommended.  The risk and  imponderables of the procedure were discussed preoperatively.  The  patient appeared to understand and agree to the procedure as outlined.   DESCRIPTION OF PROCEDURE:  The patient was seen in the holding area in  the left knee was identified as the correct surgical site.  She placed a  mark on the left knee, and I placed them are from the left knee.  She  was brought the operating room and given general anesthesia well supine.  Tourniquet and leg holder were placed and deflated left upper thigh.  She was prepped and draped in the usual manner.  We had a time out to  him identifying [name] as the patient, and the left knee is the  correct surgical site.  The leg was elevated in rip circumferentially  with Esmarch bandage and tunic and inflated to 300 mmHg.  The Esmarch  bandage removed.  Inflow cannula inserted medially, lactated Ringers  instilled into the knee by an infusion pump.  Arthroscope inserted  laterally.   The knee was systematically examined.   Findings:  Suprapatellar pouch showed mild synovitis.  Medially the  joint looked good with great to changes.  There was no care of the  medial meniscus.  The anterior cruciate was intact; laterally, however,  she had significant grade 3 to grade 4 degenerative changes of the  articular surfaces with the eburnation a bone.  There was a still a care  of the posterior horn in the lateral meniscus.  There were small little  particles of the lateral meniscus floating around laterally.  No other  loose bodies.   The patient had a meniscal punch, meniscal shaver with good smooth  contours developed.  He was then systematically re-examined and no other  pathology found.  The wounds were reapproximated using 3-0 nylon in an  interrupted vertical mattress manner.  Marcaine 0.25% was instilled in  each portal.  Tourniquet deflated after 19 minutes.  A sterile dressing  applied, bulky dressing applied, and knee immobilizer applied.  The  patient given a prescription for Vicodin ES for pain.  I will see her in  the office in approximately 10 days to two weeks.  If she has any  difficulty she is to contact me through the office or the hospital  beeper system.           ______________________________  Lenna Sciara. Sanjuana Kava, M.D.     JWK/MEDQ  D:  03/11/2007  T:  03/11/2007  Job:  [id number]

## 2011-04-04 NOTE — Discharge Summary (Signed)
NAMEMARLISSA, [name] NO.:  [id number]   MEDICAL RECORD NO.:  [id number]          PATIENT TYPE:  INP   LOCATION:  [id number]                          FACILITY:  [facility]   PHYSICIAN:  Jonnie Kind, M.D. DATE OF BIRTH:  [birth year]   DATE OF ADMISSION:  06/30/2005  DATE OF DISCHARGE:  08/16/2006LH                                 DISCHARGE SUMMARY   ADMITTING DIAGNOSES:  1.  Right ovarian mass.  2.  Hepatitis C carrier.   DISCHARGE DIAGNOSES:  1.  Right ovarian fibroma with central necrosis.  2.  Periappendiceal adhesions.  3.  Hepatitis C carrier.  4.  Mild chronic renal failure.   PROCEDURE:  Bilateral salpingo-oophorectomy with frozen section,  appendectomy and excision of old abdominal wall scar.  Pershing Cox.   DISCHARGE MEDICATIONS:  1.  Tylox one q.4 h. p.r.n. pain dispense 15.  2.  Levaquin 500 mg p.o. daily x5 days.   HOSPITAL SUMMARY:  This 54 year old postmenopausal female status post  hysterectomy was admitted for laparotomy due to an 8 cm right adnexal mass  which is tender and symptomatic.  CT of the abdomen showed no evidence of  ascites, abnormalities of liver function, and CA 125 was within normal  limits.  Medical history positive for hypertension and a history of cocaine  use in the past.  She was also recently discovered to have hepatitis C.   HOSPITAL COURSE:  The patient was admitted with BUN 25 and creatinine 1.7,  hepatitis B surface antigen negative, hepatitis C antibody positive, HIV was  negative.  Patient underwent laparotomy with removal of adnexal masses  described in the admitting and the operative note.  The specimen was  determined to be benign.  Postoperatively the patient had an uneventful recovery with hemoglobin,  blood type A positive, no transfusions required, postop hemoglobin 9.3,  hematocrit 27.6 compared to 10.6 and 32 preop.  She remained stable for  discharge on postop day #2 for routine postop instructions and  follow up 1  week our office.      Jonnie Kind, M.D.  Electronically Signed     JVF/MEDQ  D:  07/12/2005  T:  07/12/2005  Job:  [id number]   cc:   [place]  [address]  [place]   [address]  Fax: [phone]

## 2011-04-04 NOTE — Op Note (Signed)
NAMERASHAWN, Colleen Nunez NO.:  [id number]   MEDICAL RECORD NO.:  [id number]          PATIENT TYPE:  INP   LOCATION:  [id number]                          FACILITY:  [facility]   PHYSICIAN:  Jonnie Kind, M.D. DATE OF BIRTH:  Jun 19, 1956   DATE OF PROCEDURE:  06/30/2005  DATE OF DISCHARGE:  07/02/2005                                 OPERATIVE REPORT   PREOPERATIVE DIAGNOSIS:  1.  Right adnexal mass, ovarian origin, normal CA125.  2.  Hepatitis C carrier.   POSTOPERATIVE DIAGNOSIS:  Right ovarian fibroma, benign.   PROCEDURE:  Bilateral salpingo-oophorectomy.   SURGEON:  Dr. Glo Herring.   ASSISTANT:  None.   ANESTHESIA:  General.   COMPLICATIONS:  None.   FINDINGS:  Large pedunculated fibroma   Dictated ended at this point.      Jonnie Kind, M.D.  Electronically Signed     JVF/MEDQ  D:  07/16/2005  T:  07/16/2005  Job:  [id number]

## 2011-04-04 NOTE — H&P (Signed)
NAME:  Colleen Nunez, Colleen Nunez NO.:  [id number]   MEDICAL RECORD NO.:  [id number]          PATIENT TYPE:  AMB   LOCATION:  DAY                           FACILITY:  [facility]   PHYSICIAN:  Jonnie Kind, M.D. DATE OF BIRTH:  08/03/1956   DATE OF ADMISSION:  DATE OF DISCHARGE:  LH                                HISTORY & PHYSICAL   ADMISSION DIAGNOSES:  1.  Right adnexal mass, ovarian origin, normal CA125.  2.  Hepatitis C carrier.   HISTORY OF PRESENT ILLNESS:  This 55 year old premenopausal female status  post hysterectomy is admitted at this time for laparotomy for removal of an  8 cm right adnexal mass which is tender and uncomfortable to patient.  She  has had a CT scan of the abdomen, which shows no evidence of ascites,  adenopathy or liver abnormalities.  CA125 is within normal limits at 11.4.  She has a history of cocaine use in the past, and this is felt to be the  etiology of her discovered hepatitis C.  She was referred to our office  courtesy of [facility].   PAST MEDICAL HISTORY:  Positive for hypertension.   PHYSICAL EXAMINATION:  GENERAL:  A moderately obese African-American female.  VITAL SIGNS:  Weight 194, blood pressure 128/76.  HEENT:  Pupils equal, round, and reactive to light, extraocular movements  intact.  NECK:  Supple.  CHEST:  Clear to auscultation.  ABDOMEN:  Midline lower abdominal scar from prior hypertension and C-  section.  PELVIC:  Cuff is smooth.  Class I Pap smear at the health department.  Uterus absent.  EXTREMITIES:  Grossly normal.   Recent laboratory work includes a BUN of 25, creatinine 1.7, SGOT 16,  normal, SGPT 13, normal, albumin 4.3.  Hepatitis B surface antigen negative.  Hepatitis C antibody positive.   PLAN:  Exploratory laparotomy and removal of adnexal mass, suspected benign  ovarian lesion, symptomatic.       JVF/MEDQ  D:  06/27/2005  T:  06/27/2005  Job:  [id number]   cc:   [facility] Department

## 2011-08-06 LAB — BASIC METABOLIC PANEL
Chloride: 102
GFR calc non Af Amer: 37 — ABNORMAL LOW
Potassium: 4.1
Sodium: 138

## 2011-08-06 LAB — DIFFERENTIAL
Eosinophils Relative: 1
Lymphocytes Relative: 31
Lymphs Abs: 3.6
Monocytes Absolute: 0.6
Monocytes Relative: 5

## 2011-08-06 LAB — POCT CARDIAC MARKERS
CKMB, poc: 1.6
CKMB, poc: 2.8
Myoglobin, poc: 102
Myoglobin, poc: 97.2
Operator id: 264761
Troponin i, poc: <0.05
Troponin i, poc: <0.05

## 2011-08-06 LAB — D-DIMER, QUANTITATIVE: D-Dimer, Quant: 0.22

## 2011-08-06 LAB — CBC
HCT: 36.7
Hemoglobin: 11.9 — ABNORMAL LOW
MCV: 87.6
RBC: 4.19
WBC: 11.6 — ABNORMAL HIGH

## 2011-08-06 LAB — B-NATRIURETIC PEPTIDE (CONVERTED LAB): Pro B Natriuretic peptide (BNP): <30

## 2012-03-24 ENCOUNTER — Emergency Department (HOSPITAL_COMMUNITY)
Admission: EM | Admit: 2012-03-24 | Discharge: 2012-03-24 | Disposition: A | Discharge status: Home / Self Care | Payer: Medicaid Other | Attending: Emergency Medicine | Admitting: Emergency Medicine

## 2012-03-24 ENCOUNTER — Encounter (HOSPITAL_COMMUNITY): Payer: Self-pay | Admitting: *Deleted

## 2012-03-24 DIAGNOSIS — R112 Nausea with vomiting, unspecified: Secondary | ICD-10-CM | POA: Insufficient documentation

## 2012-03-24 DIAGNOSIS — I10 Essential (primary) hypertension: Secondary | ICD-10-CM | POA: Insufficient documentation

## 2012-03-24 DIAGNOSIS — N390 Urinary tract infection, site not specified: Secondary | ICD-10-CM | POA: Insufficient documentation

## 2012-03-24 HISTORY — DX: Essential (primary) hypertension: I10

## 2012-03-24 HISTORY — DX: Unspecified osteoarthritis, unspecified site: M19.90

## 2012-03-24 LAB — CARBOXYHEMOGLOBIN
O2 Saturation: 42.9 %
Total oxygen content: 6.7 mL/dL — ABNORMAL LOW (ref 15.0–23.0)

## 2012-03-24 LAB — URINALYSIS, MICROSCOPIC ONLY
Ketones, ur: NEGATIVE mg/dL
Nitrite: NEGATIVE
Specific Gravity, Urine: 1.025 (ref 1.005–1.030)
pH: 6 (ref 5.0–8.0)

## 2012-03-24 LAB — POCT I-STAT, CHEM 8
BUN: 25 mg/dL — ABNORMAL HIGH (ref 6–23)
Calcium, Ion: 1.26 mmol/L (ref 1.12–1.32)
Chloride: 107 mEq/L (ref 96–112)
Glucose, Bld: 111 mg/dL — ABNORMAL HIGH (ref 70–99)
HCT: 36 % (ref 36.0–46.0)

## 2012-03-24 MED ORDER — ONDANSETRON 8 MG PO TBDP
8.0000 mg | ORAL_TABLET | Freq: Once | ORAL | Status: AC
  Administered 2012-03-24: 8 mg via ORAL
  Filled 2012-03-24: qty 1

## 2012-03-24 MED ORDER — CIPROFLOXACIN HCL 250 MG PO TABS
500.0000 mg | ORAL_TABLET | Freq: Once | ORAL | Status: AC
  Administered 2012-03-24: 500 mg via ORAL
  Filled 2012-03-24: qty 2

## 2012-03-24 MED ORDER — ONDANSETRON 4 MG PO TBDP: 4.0000 mg | ORAL_TABLET | Freq: Three times a day (TID) | ORAL | Status: AC | PRN

## 2012-03-24 MED ORDER — CIPROFLOXACIN HCL 500 MG PO TABS: 500.0000 mg | ORAL_TABLET | Freq: Two times a day (BID) | ORAL | Status: AC

## 2012-03-24 NOTE — ED Provider Notes (Signed)
History     CSN: [id number]  Arrival date & time 03/24/12  0023   First MD Initiated Contact with Patient 03/24/12 0100      Chief Complaint  Patient presents with  . Nausea    (Consider location/radiation/quality/duration/timing/severity/associated sxs/prior treatment) HPI Comments: 56 year old female with a history of hypertension and arthritis and occasional constipation. She presents with a complaint of nausea which has been ongoing for the last several days. She states that she uses a kerosene heater in her home to keep it in the wintertime and when it is cool outside. She notes that it has had a problem with the sustaining a flame.  When this happens instead of turning off the fuel, she lays in bed and tries to stay warm. She denies any other sources of heat for her house. She states that her nausea has been persistent, waxes and wanes in intensity and is not associated with dysuria, diarrhea, chest pain, shortness of breath, cough. She does have a mild headache that comes and goes. She does not live with anybody else.  The history is provided by the patient.    Past Medical History  Diagnosis Date  . Hypertension   . Arthritis     Past Surgical History  Procedure Date  . Knee arthroscopy     No family history on file.  History  Substance Use Topics  . Smoking status: Never Smoker   . Smokeless tobacco: Not on file  . Alcohol Use: Yes    OB History    Grav Para Term Preterm Abortions TAB SAB Ect Mult Living                  Review of Systems  All other systems reviewed and are negative.    Allergies  Aspirin  Home Medications   Current Outpatient Rx  Name Route Sig Dispense Refill  . HYDROCODONE-ACETAMINOPHEN 10-500 MG PO TABS Oral Take 1 tablet by mouth every 6 (six) hours as needed.    Marland Kitchen TRAMADOL HCL 50 MG PO TABS Oral Take 50 mg by mouth every 6 (six) hours as needed.    Marland Kitchen CIPROFLOXACIN HCL 500 MG PO TABS Oral Take 1 tablet (500 mg total) by mouth 2  (two) times daily. 14 tablet 0  . ONDANSETRON 4 MG PO TBDP Oral Take 1 tablet (4 mg total) by mouth every 8 (eight) hours as needed for nausea. 10 tablet 0    BP 134/76  Pulse 82  Temp(Src) 99 F (37.2 C) (Oral)  Resp 16  Ht 5\' 2"  (1.575 m)  Wt 188 lb (85.276 kg)  BMI 34.39 kg/m2  SpO2 100%  Physical Exam  Nursing note and vitals reviewed. Constitutional: She appears well-developed and well-nourished. No distress.  HENT:  Head: Normocephalic and atraumatic.  Mouth/Throat: Oropharynx is clear and moist. No oropharyngeal exudate.       Mucous membranes are moist  Eyes: Conjunctivae and EOM are normal. Pupils are equal, round, and reactive to light. Right eye exhibits no discharge. Left eye exhibits no discharge. No scleral icterus.  Neck: Normal range of motion. Neck supple. No JVD present. No thyromegaly present.  Cardiovascular: Normal rate, regular rhythm, normal heart sounds and intact distal pulses.  Exam reveals no gallop and no friction rub.   No murmur heard. Pulmonary/Chest: Effort normal and breath sounds normal. No respiratory distress. She has no wheezes. She has no rales.  Abdominal: Soft. Bowel sounds are normal. She exhibits no distension and no mass. There  is no tenderness.  Musculoskeletal: Normal range of motion. She exhibits no edema and no tenderness.  Lymphadenopathy:    She has no cervical adenopathy.  Neurological: She is alert. Coordination normal.  Skin: Skin is warm and dry. No rash noted. No erythema.  Psychiatric: She has a normal mood and affect. Her behavior is normal.    ED Course  Procedures (including critical care time)  Labs Reviewed  CARBOXYHEMOGLOBIN - Abnormal; Notable for the following:    Total hemoglobin 11.4 (*)    Total oxygen content 6.7 (*)    All other components within normal limits  URINALYSIS, WITH MICROSCOPIC - Abnormal; Notable for the following:    Protein, ur TRACE (*)    Leukocytes, UA MODERATE (*)    Bacteria, UA FEW  (*)    Squamous Epithelial / LPF FEW (*)    All other components within normal limits  POCT I-STAT, CHEM 8 - Abnormal; Notable for the following:    BUN 25 (*)    Creatinine, Ser 1.80 (*)    Glucose, Bld 111 (*)    All other components within normal limits  URINE CULTURE   No results found.   1. UTI (lower urinary tract infection)   2. Nausea and vomiting       MDM  The patient has no focal neurologic deficits including cranial nerves III through XII, sensation or motor of the lower extremities. She has no edema, normal heart sounds, normal lung sounds, soft abdomen and moist mucous membranes. Her conjunctiva appear clear, she does not appear to be anemic and has normal vital signs. Will evaluate for carboxyhemoglobin level, urinalysis, i-STAT chemistry and dissolvable Zofran.   UTI present on labs, CO normal, VS normal, Cipro given, cx ordered, pt informed of results.  Discharge Prescriptions include:  Ciprofloxacin zofran     Johnna Acosta, MD 03/24/12 [phone]

## 2012-03-24 NOTE — Discharge Instructions (Signed)
Please take ciprofloxacin twice a day for 7 days - return to the hospital for severe or worsening vomiting, diarrhea, fever or inability to tolerate the medications - see your doctor in 48 hours for a recheck.  zofran for nausea.    Your tests show that you have a urinary infection.

## 2012-03-24 NOTE — ED Notes (Signed)
Pt reports her stove "went out" 3 days ago and ever since she inhaled the "fumes" she has been sick on her stomach

## 2012-03-24 NOTE — ED Notes (Signed)
Pt reports being weak and "not feeling well" for several days.  Reports that 3 days ago, she had an oil stove go out and since that time has been smelling fumes but has been too tired to open the windows for fresh air.  No distress noted. O2 saturation 100% on room air.

## 2012-03-25 LAB — URINE CULTURE: Colony Count: >=100000

## 2012-11-24 ENCOUNTER — Emergency Department (HOSPITAL_COMMUNITY)
Admission: EM | Admit: 2012-11-24 | Discharge: 2012-11-24 | Disposition: A | Discharge status: Home / Self Care | Payer: Medicaid Other | Attending: Emergency Medicine | Admitting: Emergency Medicine

## 2012-11-24 ENCOUNTER — Emergency Department (HOSPITAL_COMMUNITY): Payer: Medicaid Other

## 2012-11-24 ENCOUNTER — Encounter (HOSPITAL_COMMUNITY): Payer: Self-pay | Admitting: *Deleted

## 2012-11-24 DIAGNOSIS — Z8739 Personal history of other diseases of the musculoskeletal system and connective tissue: Secondary | ICD-10-CM | POA: Insufficient documentation

## 2012-11-24 DIAGNOSIS — E060 Acute thyroiditis: Secondary | ICD-10-CM | POA: Insufficient documentation

## 2012-11-24 DIAGNOSIS — Z9889 Other specified postprocedural states: Secondary | ICD-10-CM | POA: Insufficient documentation

## 2012-11-24 DIAGNOSIS — I10 Essential (primary) hypertension: Secondary | ICD-10-CM | POA: Insufficient documentation

## 2012-11-24 MED ORDER — TRAMADOL HCL 50 MG PO TABS
100.0000 mg | ORAL_TABLET | Freq: Once | ORAL | Status: AC
  Administered 2012-11-24: 100 mg via ORAL
  Filled 2012-11-24: qty 2

## 2012-11-24 MED ORDER — ACETAMINOPHEN 500 MG PO TABS
1000.0000 mg | ORAL_TABLET | Freq: Once | ORAL | Status: AC
  Administered 2012-11-24: 1000 mg via ORAL
  Filled 2012-11-24: qty 2

## 2012-11-24 MED ORDER — TRAMADOL HCL 50 MG PO TABS: 100.0000 mg | ORAL_TABLET | Freq: Four times a day (QID) | ORAL | Status: DC | PRN

## 2012-11-24 NOTE — ED Provider Notes (Signed)
History   Scribed for Janice Norrie, MD, the patient was seen in room [facility]12/[facility]12 . This chart was scribed by Denice Bors.   CSN: [id number]  Arrival date & time 11/24/12  1516   First MD Initiated Contact with Patient 11/24/12 1536      Chief Complaint  Patient presents with  . Extremity Weakness    (Consider location/radiation/quality/duration/timing/severity/associated sxs/prior treatment) HPI Colleen Nunez is a 57 y.o. female who presents to the Emergency Department complaining of resolved itching of the palm and dorsum  Last week started itching left hand posterior aspect  Denies rash  Left posterior hand was swollen and this lasted 2 days and benadryl helped it go away Fever but didn't check it  Sore throat makes it difficult to swallow  Didn't take anything today  amlodipine bp meds  Postnasal drip  Minimal coughing  Denies SOB denies  Drinks 1 beer a day  Friends have been sick with ? illnesses  Patient reports a week ago she started having itching and redness of palm of her left hand and swelling of the dorsum of her hand which she used ice packs and Benadryl and it went away in about 2 days. She does report she started having sore throat about the same time. She has undocumented fever. She states she's able to drink fluids but is having difficulty eating food. She also complains of postnasal drip and a mild cough. She denies shortness of breath. She states she feels weak and has loss of appetite. She states she's continuing to take Benadryl but it's making her very sleepy. Friends have been sick with ? illnesses   PCP Dr Legrand Rams  Past Medical History  Diagnosis Date  . Hypertension   . Arthritis     Past Surgical History  Procedure Date  . Knee arthroscopy   . Abdominal hysterectomy   . Back surgery     History reviewed. No pertinent family history.  History  Substance Use Topics  . Smoking status: Never Smoker   . Smokeless tobacco: Not on file  .  Alcohol Use: Yes 1 beer a day.  Lives at home Lives alone On disability for mental problems.   OB History    Grav Para Term Preterm Abortions TAB SAB Ect Mult Living                  Review of Systems  Allergies  Aspirin  Home Medications    Patient's Medications  Previous Medications   AMLODIPINE (NORVASC) 5 MG TABLET    Take 5 mg by mouth daily.   HYDROCODONE-ACETAMINOPHEN (NORCO) 7.5-325 MG PER TABLET    Take 1 tablet by mouth every 4 (four) hours as needed. *TO BE CRUSHED* For pain     BP 135/82  Pulse 92  Temp 98.6 F (37 C) (Oral)  Resp 20  Ht 5\' 2"  (1.575 m)  Wt 190 lb (86.183 kg)  BMI 34.75 kg/m2  SpO2 100%  Vital signs normal    Physical Exam  Nursing note and vitals reviewed. Constitutional: She is oriented to person, place, and time. She appears well-developed and well-nourished.  HENT:  Head: Normocephalic and atraumatic.  Right Ear: External ear normal.  Nose: Nose normal.  Mouth/Throat: Oropharynx is clear and moist. No oropharyngeal exudate.       Minimal redness of the posterior pharynx, no swelling   Eyes: Conjunctivae normal are normal. Pupils are equal, round, and reactive to light.  Neck: Normal range of motion.  Neck supple. No thyromegaly present.       Tender over her thyroid which reproduces her c/o sore throat and tender there also when she swallows   Cardiovascular: Normal rate and normal heart sounds.   Pulmonary/Chest: Effort normal and breath sounds normal. No respiratory distress. She has no wheezes. She has no rales.  Abdominal: Soft. She exhibits no distension.  Musculoskeletal: Normal range of motion. She exhibits no edema and no tenderness.       No redness, no rash, no swelling to the left hand Bilaterally enlarged MCP's, and mild ulnar deviation c/w hx of arthritis  Lymphadenopathy:    She has no cervical adenopathy.  Neurological: She is alert and oriented to person, place, and time.  Skin: Skin is warm and dry.    Psychiatric: She has a normal mood and affect.    ED Course  Procedures (including critical care time)   Medications  traMADol (ULTRAM) tablet 100 mg (100 mg Oral Given 11/24/12 1630)  acetaminophen (TYLENOL) tablet 1,000 mg (1000 mg Oral Given 11/24/12 1629)   5:15  PM Pt reports feeling better at this time. States her spitting up plegm is better   Labs TSH and T4 pending  US Soft Tissue Head/neck  11/24/2012  *RADIOLOGY REPORT*  Clinical Data: Thyroid pain.  Thyroid tenderness.  THYROID ULTRASOUND  Technique: Ultrasound examination of the thyroid gland and adjacent soft tissues was performed.  Comparison:  None.  Findings:  Right thyroid lobe:  36 mm x 16 mm x 22 mm. Left thyroid lobe:  34 mm x 16 mm x 14 mm. Isthmus:  5 mm.  Focal nodules:  Single hypoechoic nodule is present in the interpolar upper polar left thyroid lobe measuring 4 mm.  Lymphadenopathy:  Multiple small cervical lymph nodes are present in the neck, with the largest node measuring 16 mm x 4 mm x 9 mm. These nodes have preserved fatty hilum.  Thyroid vascularity appears within normal limits.  IMPRESSION: 1.  Prominent small lymph nodes in the neck are probably reactive. These maintain a fatty hilum. 2.  Thyroid gland is within normal limits aside from a tiny 4 mm hypoechoic nodule in the left lobe.   Original Report Authenticated By: Dereck Ligas, M.D.      1. Acute thyroiditis    New Prescriptions   TRAMADOL (ULTRAM) 50 MG TABLET    Take 2 tablets (100 mg total) by mouth every 6 (six) hours as needed for pain.   Plan discharge  Rolland Porter, MD, FACEP    MDM    I personally performed the services described in this documentation, which was scribed in my presence. The recorded information has been reviewed and considered.  Rolland Porter, MD, Abram Sander    Janice Norrie, MD 11/24/12 [phone]

## 2012-11-24 NOTE — ED Notes (Signed)
MD at bedside. 

## 2012-11-24 NOTE — ED Notes (Signed)
Sore throat, feels weak, Lt arm weak.  Fever.  No nvd.Green sputum.

## 2012-11-24 NOTE — ED Notes (Signed)
Pt's primary RN in room with pt.

## 2012-11-25 LAB — T4, FREE: Free T4: 1.32 ng/dL (ref 0.80–1.80)

## 2012-11-25 LAB — TSH: TSH: 3.167 u[IU]/mL (ref 0.350–4.500)

## 2013-04-28 ENCOUNTER — Other Ambulatory Visit (HOSPITAL_COMMUNITY): Payer: Self-pay | Admitting: Orthopaedic Surgery

## 2013-04-28 DIAGNOSIS — M1990 Unspecified osteoarthritis, unspecified site: Secondary | ICD-10-CM

## 2013-05-04 ENCOUNTER — Ambulatory Visit (HOSPITAL_COMMUNITY): Payer: Medicaid Other

## 2013-05-11 ENCOUNTER — Ambulatory Visit (HOSPITAL_COMMUNITY)
Admission: RE | Admit: 2013-05-11 | Discharge: 2013-05-11 | Disposition: A | Discharge status: Home / Self Care | Payer: Medicaid Other | Source: Ambulatory Visit | Attending: Orthopaedic Surgery | Admitting: Orthopaedic Surgery

## 2013-05-11 DIAGNOSIS — M25569 Pain in unspecified knee: Secondary | ICD-10-CM | POA: Insufficient documentation

## 2013-05-11 DIAGNOSIS — M1990 Unspecified osteoarthritis, unspecified site: Secondary | ICD-10-CM

## 2013-05-11 DIAGNOSIS — M1710 Unilateral primary osteoarthritis, unspecified knee: Secondary | ICD-10-CM | POA: Insufficient documentation

## 2013-05-17 ENCOUNTER — Other Ambulatory Visit: Payer: Self-pay | Admitting: Radiology

## 2013-06-03 ENCOUNTER — Emergency Department (HOSPITAL_COMMUNITY)
Admission: EM | Admit: 2013-06-03 | Discharge: 2013-06-03 | Disposition: A | Discharge status: Home / Self Care | Payer: Medicaid Other | Attending: Emergency Medicine | Admitting: Emergency Medicine

## 2013-06-03 ENCOUNTER — Emergency Department (HOSPITAL_COMMUNITY): Payer: Medicaid Other

## 2013-06-03 ENCOUNTER — Encounter (HOSPITAL_COMMUNITY): Payer: Self-pay | Admitting: *Deleted

## 2013-06-03 DIAGNOSIS — R51 Headache: Secondary | ICD-10-CM | POA: Insufficient documentation

## 2013-06-03 DIAGNOSIS — I10 Essential (primary) hypertension: Secondary | ICD-10-CM | POA: Insufficient documentation

## 2013-06-03 DIAGNOSIS — M549 Dorsalgia, unspecified: Secondary | ICD-10-CM | POA: Insufficient documentation

## 2013-06-03 DIAGNOSIS — M542 Cervicalgia: Secondary | ICD-10-CM | POA: Insufficient documentation

## 2013-06-03 DIAGNOSIS — M1710 Unilateral primary osteoarthritis, unspecified knee: Secondary | ICD-10-CM | POA: Insufficient documentation

## 2013-06-03 DIAGNOSIS — M4802 Spinal stenosis, cervical region: Secondary | ICD-10-CM | POA: Insufficient documentation

## 2013-06-03 DIAGNOSIS — M129 Arthropathy, unspecified: Secondary | ICD-10-CM | POA: Insufficient documentation

## 2013-06-03 DIAGNOSIS — R209 Unspecified disturbances of skin sensation: Secondary | ICD-10-CM | POA: Insufficient documentation

## 2013-06-03 MED ORDER — DIAZEPAM 5 MG PO TABS
5.0000 mg | ORAL_TABLET | Freq: Once | ORAL | Status: AC
  Administered 2013-06-03: 5 mg via ORAL
  Filled 2013-06-03: qty 1

## 2013-06-03 MED ORDER — METHOCARBAMOL 500 MG PO TABS: 500.0000 mg | ORAL_TABLET | Freq: Three times a day (TID) | ORAL | Status: DC

## 2013-06-03 MED ORDER — HYDROCODONE-ACETAMINOPHEN 7.5-325 MG PO TABS: 1.0000 | ORAL_TABLET | ORAL | Status: DC | PRN

## 2013-06-03 MED ORDER — HYDROCODONE-ACETAMINOPHEN 5-325 MG PO TABS
2.0000 | ORAL_TABLET | Freq: Once | ORAL | Status: AC
  Administered 2013-06-03: 2 via ORAL
  Filled 2013-06-03: qty 2

## 2013-06-03 MED ORDER — METHOCARBAMOL 500 MG PO TABS
1000.0000 mg | ORAL_TABLET | Freq: Once | ORAL | Status: AC
  Administered 2013-06-03: 1000 mg via ORAL
  Filled 2013-06-03: qty 2

## 2013-06-03 MED ORDER — DEXAMETHASONE SODIUM PHOSPHATE 4 MG/ML IJ SOLN
8.0000 mg | Freq: Once | INTRAMUSCULAR | Status: AC
  Administered 2013-06-03: 8 mg via INTRAMUSCULAR
  Filled 2013-06-03: qty 2

## 2013-06-03 MED ORDER — ONDANSETRON HCL 4 MG PO TABS
4.0000 mg | ORAL_TABLET | Freq: Once | ORAL | Status: AC
  Administered 2013-06-03: 4 mg via ORAL
  Filled 2013-06-03: qty 1

## 2013-06-03 NOTE — ED Notes (Signed)
Pt spoke to her family on the phone. Eating a meal.

## 2013-06-03 NOTE — ED Notes (Signed)
Pt asking to eat. Waiting for MRI Report.

## 2013-06-03 NOTE — ED Provider Notes (Signed)
History    CSN: [id number] Arrival date & time 06/03/13  1032  First MD Initiated Contact with Patient 06/03/13 1040     Chief Complaint  Patient presents with  . Headache   (Consider location/radiation/quality/duration/timing/severity/associated sxs/prior Treatment) Patient is a 57 y.o. female presenting with headaches. The history is provided by the patient.  Headache Pain location:  Occipital Quality:  Sharp Radiates to:  R neck and R shoulder Severity at highest:  10/10 Onset quality:  Gradual Duration:  4 days Timing:  Intermittent Progression:  Worsening Chronicity:  Recurrent Similar to prior headaches: yes   Context comment:  Neck pain and posterior headache pain. Worsened by:  Neck movement Ineffective treatments:  NSAIDs Associated symptoms: back pain, neck pain and tingling   Associated symptoms: no abdominal pain, no cough, no dizziness, no near-syncope, no photophobia and no seizures    Past Medical History  Diagnosis Date  . Hypertension   . Arthritis    Past Surgical History  Procedure Laterality Date  . Knee arthroscopy    . Abdominal hysterectomy     History reviewed. No pertinent family history. History  Substance Use Topics  . Smoking status: Never Smoker   . Smokeless tobacco: Not on file  . Alcohol Use: Yes   OB History   Grav Para Term Preterm Abortions TAB SAB Ect Mult Living                 Review of Systems  Constitutional: Negative for activity change.       All ROS Neg except as noted in HPI  HENT: Positive for neck pain. Negative for nosebleeds.   Eyes: Negative for photophobia and discharge.  Respiratory: Negative for cough, shortness of breath and wheezing.   Cardiovascular: Negative for chest pain, palpitations and near-syncope.  Gastrointestinal: Negative for abdominal pain and blood in stool.  Genitourinary: Negative for dysuria, frequency and hematuria.  Musculoskeletal: Positive for back pain. Negative for arthralgias.   Skin: Negative.   Neurological: Positive for headaches. Negative for dizziness, seizures and speech difficulty.  Psychiatric/Behavioral: Negative for hallucinations and confusion.    Allergies  Aspirin  Home Medications   Current Outpatient Rx  Name  Route  Sig  Dispense  Refill  . amLODipine (NORVASC) 5 MG tablet   Oral   Take 5 mg by mouth daily.         Marland Kitchen HYDROcodone-acetaminophen (NORCO) 7.5-325 MG per tablet   Oral   Take 1-2 tablets by mouth every 4 (four) hours as needed for pain. *TO BE CRUSHED* For pain         . ibuprofen (ADVIL,MOTRIN) 200 MG tablet   Oral   Take 600 mg by mouth every 6 (six) hours as needed for pain.          BP 124/76  Pulse 78  Temp(Src) 98.1 F (36.7 C)  Resp 14  Ht 5\' 2"  (1.575 m)  Wt 182 lb (82.555 kg)  BMI 33.28 kg/m2  SpO2 100% Physical Exam  Nursing note and vitals reviewed. Constitutional: She is oriented to person, place, and time. She appears well-developed and well-nourished.  Non-toxic appearance.  HENT:  Head: Normocephalic.  Right Ear: Tympanic membrane and external ear normal.  Left Ear: Tympanic membrane and external ear normal.  Eyes: EOM and lids are normal. Pupils are equal, round, and reactive to light.  Neck: Normal range of motion. Neck supple. Carotid bruit is not present.  Cardiovascular: Normal rate, regular rhythm, normal heart sounds,  intact distal pulses and normal pulses.   Pulmonary/Chest: Breath sounds normal. No respiratory distress.  Abdominal: Soft. Bowel sounds are normal. There is no tenderness. There is no guarding.  Musculoskeletal: Normal range of motion.  There is pain to palpation and attempted range of motion of the right paraspinal area of the cervical region. There is no palpable step off. There is pain to the upper trapezius, right greater than left. Mild to mod atrophy of the right and left thenar emence   Lymphadenopathy:       Head (right side): No submandibular adenopathy present.        Head (left side): No submandibular adenopathy present.    She has no cervical adenopathy.  Neurological: She is alert and oriented to person, place, and time. She has normal strength. No cranial nerve deficit or sensory deficit.  Grip is weaker on the right than on the left. No sensory deficits appreciated.  Skin: Skin is warm and dry.  Psychiatric: She has a normal mood and affect. Her speech is normal.    ED Course  Procedures (including critical care time) Labs Reviewed - No data to display Ct Cervical Spine Wo Contrast  06/03/2013   *RADIOLOGY REPORT*  Clinical Data: Headache with right neck and shoulder pain  CT CERVICAL SPINE WITHOUT CONTRAST  Technique:  Multidetector CT imaging of the cervical spine was performed. Multiplanar CT image reconstructions were also generated.  Comparison: None.  Findings: Imaging was obtained from the skull base through the T1 vertebral body.  No evidence for cervical spine fracture.  There is diffuse loss of disc height with prominent anterior and posterior spurring.  Posterior spurring/mineralization of the posterior longitudinal ligament narrows the AP diameter of the canal to 5 mm in some locations.  Facets are well-aligned bilaterally.  There is no prevertebral soft tissue swelling.  Straightening of the normal cervical lordosis is evident.  IMPRESSION: No acute bony findings.  Severe central canal stenosis secondary to posterior osteophytes and ossification of the posterior longitudinal ligament.  MRI of the cervical spine would be helpful to assess anatomy of the spinal cord.   Original Report Authenticated By: Misty Stanley, M.D.   No diagnosis found.  MDM  **I have reviewed nursing notes, vital signs, and all appropriate lab and imaging results for this patient.* Patient presents to the emergency department with occipital area headaches, and getting a" catch in the right side of the neck going into the right shoulder. The patient states that she  has tried Aleve, rest, and heat without any relief. On interview and examination the patient states that she cannot pick up a full glass of water with the right hand and she has increasing headache and neck pain.  A CT scan of the cervical spine reveals straightening of the normal cervical lordosis. There is posterior spurring and diffuse loss of disc height being present. There is a narrow area of the cervical spinal canal that measures 5 mm in locations. Do to severe central canal stenosis it was thought that an MRI would be needed. Case was reviewed by Dr. Lacinda Axon, and MRI has been ordered.  MRI reveals severe spinal stenosis with cord compression at C3-4 through C5-6. Pt seen with me by Dr Eulis Foster. Pt placed in a collar, and call placed to Neurosurgery. Case Discussed with Dr Luiz Ochoa (neurosurgery). He will see pt in the office on Monday 7/21 at 11am. Finding and appointment given to pt. Rx for robaxin and norco also given to the patient.  Lenox Ahr, PA-C 06/03/13 [phone]

## 2013-06-03 NOTE — ED Notes (Signed)
Alert, talking, Pain post neck since Monday, No injury.  Has developed post headache also.  No nausea. Seen by Dr Luna Glasgow this am for injection in lt knee. Plans knee surgery next month.

## 2013-06-03 NOTE — ED Notes (Signed)
C/O headache in occipital area after "getting catch in the right side of my neck".  Takes Alleve which has helped enough to let her sleep, but pain returns upon waking.  Cervical support while lying also helps pain.

## 2013-06-06 ENCOUNTER — Other Ambulatory Visit: Payer: Self-pay | Admitting: Neurosurgery

## 2013-06-08 MED ORDER — CHLORHEXIDINE GLUCONATE 4 % EX LIQD: 60.0000 mL | Freq: Once | CUTANEOUS | Status: DC

## 2013-06-08 MED ORDER — CEFAZOLIN SODIUM-DEXTROSE 2-3 GM-% IV SOLR
2.0000 g | INTRAVENOUS | Status: AC
  Administered 2013-06-09: 1 g via INTRAVENOUS
  Administered 2013-06-09: 2 g via INTRAVENOUS
  Filled 2013-06-08: qty 50

## 2013-06-08 NOTE — Progress Notes (Addendum)
I have made several unsuccessful attempts to reach pt. A message was left on voicemail during an earlier attempt which included a direct call back number to contact me for pre-op instructions. Unable to leave pre-op instructions now because phone is now disconnected # [phone]. In addition, a call was placed to [phone] (a [facility] where I spoke to Mr. Allie Dimmer and he stated that pt is not a tenant at shelter. Dr. Saintclair Halsted was made aware of the failed attempts to contact pt.

## 2013-06-09 ENCOUNTER — Inpatient Hospital Stay (HOSPITAL_COMMUNITY)
Admission: RE | Admit: 2013-06-09 | Discharge: 2013-06-14 | DRG: 473 | Disposition: A | Discharge status: Home / Self Care | Payer: Medicaid Other | Source: Ambulatory Visit | Attending: Neurosurgery | Admitting: Neurosurgery

## 2013-06-09 ENCOUNTER — Ambulatory Visit (HOSPITAL_COMMUNITY): Payer: Medicaid Other | Admitting: Anesthesiology

## 2013-06-09 ENCOUNTER — Encounter (HOSPITAL_COMMUNITY): Payer: Self-pay | Admitting: Anesthesiology

## 2013-06-09 ENCOUNTER — Encounter (HOSPITAL_COMMUNITY)
Admission: RE | Disposition: A | Discharge status: Home / Self Care | Payer: Self-pay | Source: Ambulatory Visit | Attending: Neurosurgery

## 2013-06-09 ENCOUNTER — Ambulatory Visit (HOSPITAL_COMMUNITY): Payer: Medicaid Other

## 2013-06-09 DIAGNOSIS — I129 Hypertensive chronic kidney disease with stage 1 through stage 4 chronic kidney disease, or unspecified chronic kidney disease: Secondary | ICD-10-CM | POA: Diagnosis present

## 2013-06-09 DIAGNOSIS — B1920 Unspecified viral hepatitis C without hepatic coma: Secondary | ICD-10-CM | POA: Diagnosis present

## 2013-06-09 DIAGNOSIS — Z886 Allergy status to analgesic agent status: Secondary | ICD-10-CM

## 2013-06-09 DIAGNOSIS — A5900 Urogenital trichomoniasis, unspecified: Secondary | ICD-10-CM | POA: Diagnosis present

## 2013-06-09 DIAGNOSIS — M4712 Other spondylosis with myelopathy, cervical region: Principal | ICD-10-CM | POA: Diagnosis present

## 2013-06-09 DIAGNOSIS — N189 Chronic kidney disease, unspecified: Secondary | ICD-10-CM | POA: Diagnosis present

## 2013-06-09 DIAGNOSIS — R339 Retention of urine, unspecified: Secondary | ICD-10-CM | POA: Diagnosis not present

## 2013-06-09 DIAGNOSIS — Z79899 Other long term (current) drug therapy: Secondary | ICD-10-CM

## 2013-06-09 HISTORY — PX: ANTERIOR CERVICAL CORPECTOMY: SHX1159

## 2013-06-09 LAB — BASIC METABOLIC PANEL
GFR calc Af Amer: 40 mL/min — ABNORMAL LOW (ref >90)
GFR calc non Af Amer: 34 mL/min — ABNORMAL LOW (ref >90)
Potassium: 3.8 mEq/L (ref 3.5–5.1)
Sodium: 141 mEq/L (ref 135–145)

## 2013-06-09 LAB — CBC WITH DIFFERENTIAL/PLATELET
Basophils Absolute: 0.1 10*3/uL (ref 0.0–0.1)
Basophils Relative: 1 % (ref 0–1)
Eosinophils Absolute: 0.8 10*3/uL — ABNORMAL HIGH (ref 0.0–0.7)
Eosinophils Relative: 8 % — ABNORMAL HIGH (ref 0–5)
MCH: 28.7 pg (ref 26.0–34.0)
MCHC: 33.9 g/dL (ref 30.0–36.0)
MCV: 84.7 fL (ref 78.0–100.0)
Neutrophils Relative %: 48 % (ref 43–77)
Platelets: 208 10*3/uL (ref 150–400)
RDW: 14 % (ref 11.5–15.5)

## 2013-06-09 LAB — PROTIME-INR: Prothrombin Time: 12.9 seconds (ref 11.6–15.2)

## 2013-06-09 LAB — SURGICAL PCR SCREEN
MRSA, PCR: NEGATIVE
Staphylococcus aureus: NEGATIVE

## 2013-06-09 SURGERY — ANTERIOR CERVICAL CORPECTOMY
Anesthesia: General | Site: Neck | Wound class: Clean

## 2013-06-09 MED ORDER — MENTHOL 3 MG MT LOZG
1.0000 | LOZENGE | OROMUCOSAL | Status: DC | PRN
  Filled 2013-06-09: qty 9

## 2013-06-09 MED ORDER — THROMBIN 20000 UNITS EX SOLR
CUTANEOUS | Status: DC | PRN
  Administered 2013-06-09: 16:00:00 via TOPICAL

## 2013-06-09 MED ORDER — ONDANSETRON HCL 4 MG/2ML IJ SOLN
INTRAMUSCULAR | Status: DC | PRN
  Administered 2013-06-09: 4 mg via INTRAVENOUS

## 2013-06-09 MED ORDER — DOCUSATE SODIUM 100 MG PO CAPS
100.0000 mg | ORAL_CAPSULE | Freq: Two times a day (BID) | ORAL | Status: DC
  Administered 2013-06-09 – 2013-06-13 (×8): 100 mg via ORAL
  Filled 2013-06-09 (×10): qty 1

## 2013-06-09 MED ORDER — HYDROCODONE-ACETAMINOPHEN 5-325 MG PO TABS: 1.0000 | ORAL_TABLET | ORAL | Status: DC | PRN

## 2013-06-09 MED ORDER — SODIUM CHLORIDE 0.9 % IJ SOLN
3.0000 mL | Freq: Two times a day (BID) | INTRAMUSCULAR | Status: DC
  Administered 2013-06-10 – 2013-06-13 (×6): 3 mL via INTRAVENOUS

## 2013-06-09 MED ORDER — SODIUM CHLORIDE 0.9 % IR SOLN
Status: DC | PRN
  Administered 2013-06-09: 15:00:00

## 2013-06-09 MED ORDER — HYDROMORPHONE HCL PF 1 MG/ML IJ SOLN: 0.2500 mg | INTRAMUSCULAR | Status: DC | PRN

## 2013-06-09 MED ORDER — PROPOFOL 10 MG/ML IV BOLUS
INTRAVENOUS | Status: DC | PRN
  Administered 2013-06-09: 20 mg via INTRAVENOUS
  Administered 2013-06-09: 150 mg via INTRAVENOUS

## 2013-06-09 MED ORDER — ACETAMINOPHEN 325 MG PO TABS: 650.0000 mg | ORAL_TABLET | ORAL | Status: DC | PRN

## 2013-06-09 MED ORDER — PROMETHAZINE HCL 25 MG/ML IJ SOLN: 6.2500 mg | INTRAMUSCULAR | Status: DC | PRN

## 2013-06-09 MED ORDER — LACTATED RINGERS IV SOLN
INTRAVENOUS | Status: DC
  Administered 2013-06-09: 1000 mL via INTRAVENOUS

## 2013-06-09 MED ORDER — PHENYLEPHRINE HCL 10 MG/ML IJ SOLN
INTRAMUSCULAR | Status: DC | PRN
  Administered 2013-06-09: 40 ug via INTRAVENOUS
  Administered 2013-06-09: 80 ug via INTRAVENOUS

## 2013-06-09 MED ORDER — SODIUM CHLORIDE 0.9 % IV SOLN
INTRAVENOUS | Status: AC
  Filled 2013-06-09: qty 500

## 2013-06-09 MED ORDER — PROMETHAZINE HCL 25 MG/ML IJ SOLN
12.5000 mg | INTRAMUSCULAR | Status: DC | PRN
  Filled 2013-06-09: qty 1

## 2013-06-09 MED ORDER — METHOCARBAMOL 500 MG PO TABS
500.0000 mg | ORAL_TABLET | Freq: Four times a day (QID) | ORAL | Status: DC | PRN
  Filled 2013-06-09: qty 1

## 2013-06-09 MED ORDER — SODIUM CHLORIDE 0.9 % IV SOLN: 250.0000 mL | INTRAVENOUS | Status: DC

## 2013-06-09 MED ORDER — OXYCODONE HCL 5 MG PO TABS: 5.0000 mg | ORAL_TABLET | Freq: Once | ORAL | Status: DC | PRN

## 2013-06-09 MED ORDER — DEXAMETHASONE SODIUM PHOSPHATE 4 MG/ML IJ SOLN
INTRAMUSCULAR | Status: DC | PRN
  Administered 2013-06-09: 10 mg via INTRAVENOUS

## 2013-06-09 MED ORDER — CYCLOBENZAPRINE HCL 10 MG PO TABS
10.0000 mg | ORAL_TABLET | Freq: Three times a day (TID) | ORAL | Status: DC | PRN
  Administered 2013-06-12: 10 mg via ORAL
  Filled 2013-06-09 (×2): qty 1

## 2013-06-09 MED ORDER — MUPIROCIN 2 % EX OINT
TOPICAL_OINTMENT | CUTANEOUS | Status: AC
  Filled 2013-06-09: qty 22

## 2013-06-09 MED ORDER — ARTIFICIAL TEARS OP OINT
TOPICAL_OINTMENT | OPHTHALMIC | Status: DC | PRN
  Administered 2013-06-09: 1 via OPHTHALMIC

## 2013-06-09 MED ORDER — PHENOL 1.4 % MT LIQD: 1.0000 | OROMUCOSAL | Status: DC | PRN

## 2013-06-09 MED ORDER — ROCURONIUM BROMIDE 100 MG/10ML IV SOLN
INTRAVENOUS | Status: DC | PRN
  Administered 2013-06-09 (×2): 10 mg via INTRAVENOUS
  Administered 2013-06-09: 50 mg via INTRAVENOUS

## 2013-06-09 MED ORDER — KETOROLAC TROMETHAMINE 30 MG/ML IJ SOLN
INTRAMUSCULAR | Status: AC
  Administered 2013-06-09: 15 mg via INTRAVENOUS
  Filled 2013-06-09: qty 1

## 2013-06-09 MED ORDER — KETOROLAC TROMETHAMINE 30 MG/ML IJ SOLN
15.0000 mg | Freq: Four times a day (QID) | INTRAMUSCULAR | Status: AC
  Administered 2013-06-10 (×3): 15 mg via INTRAVENOUS
  Filled 2013-06-09 (×5): qty 1

## 2013-06-09 MED ORDER — HYDROMORPHONE HCL PF 1 MG/ML IJ SOLN
INTRAMUSCULAR | Status: AC
  Administered 2013-06-09: 0.5 mg via INTRAVENOUS
  Filled 2013-06-09: qty 1

## 2013-06-09 MED ORDER — METHOCARBAMOL 100 MG/ML IJ SOLN
500.0000 mg | Freq: Four times a day (QID) | INTRAVENOUS | Status: DC | PRN
  Filled 2013-06-09: qty 5

## 2013-06-09 MED ORDER — ACETAMINOPHEN 650 MG RE SUPP: 650.0000 mg | RECTAL | Status: DC | PRN

## 2013-06-09 MED ORDER — LACTATED RINGERS IV SOLN
INTRAVENOUS | Status: DC
  Administered 2013-06-09 (×4): via INTRAVENOUS

## 2013-06-09 MED ORDER — NEOSTIGMINE METHYLSULFATE 1 MG/ML IJ SOLN
INTRAMUSCULAR | Status: DC | PRN
  Administered 2013-06-09: 5 mg via INTRAVENOUS

## 2013-06-09 MED ORDER — LIDOCAINE-EPINEPHRINE 1 %-1:100000 IJ SOLN
INTRAMUSCULAR | Status: DC | PRN
  Administered 2013-06-09: 20 mL

## 2013-06-09 MED ORDER — CEFAZOLIN SODIUM 1-5 GM-% IV SOLN
1.0000 g | Freq: Three times a day (TID) | INTRAVENOUS | Status: AC
  Administered 2013-06-09 – 2013-06-10 (×2): 1 g via INTRAVENOUS
  Filled 2013-06-09 (×2): qty 50

## 2013-06-09 MED ORDER — THROMBIN 20000 UNITS EX SOLR
CUTANEOUS | Status: DC | PRN
  Administered 2013-06-09: 15:00:00 via TOPICAL

## 2013-06-09 MED ORDER — CEFAZOLIN SODIUM 1-5 GM-% IV SOLN
INTRAVENOUS | Status: AC
  Filled 2013-06-09: qty 50

## 2013-06-09 MED ORDER — ONDANSETRON HCL 4 MG/2ML IJ SOLN: 4.0000 mg | INTRAMUSCULAR | Status: DC | PRN

## 2013-06-09 MED ORDER — GLYCOPYRROLATE 0.2 MG/ML IJ SOLN
INTRAMUSCULAR | Status: DC | PRN
  Administered 2013-06-09: .8 mg via INTRAVENOUS

## 2013-06-09 MED ORDER — OXYCODONE HCL 5 MG/5ML PO SOLN: 5.0000 mg | Freq: Once | ORAL | Status: DC | PRN

## 2013-06-09 MED ORDER — LIDOCAINE HCL (CARDIAC) 20 MG/ML IV SOLN
INTRAVENOUS | Status: DC | PRN
  Administered 2013-06-09: 50 mg via INTRAVENOUS

## 2013-06-09 MED ORDER — BISACODYL 10 MG RE SUPP
10.0000 mg | Freq: Every day | RECTAL | Status: DC | PRN
  Administered 2013-06-11: 10 mg via RECTAL
  Filled 2013-06-09: qty 1

## 2013-06-09 MED ORDER — BACITRACIN 50000 UNITS IM SOLR
INTRAMUSCULAR | Status: AC
  Filled 2013-06-09: qty 1

## 2013-06-09 MED ORDER — PHENYLEPHRINE HCL 10 MG/ML IJ SOLN
10.0000 mg | INTRAVENOUS | Status: DC | PRN
  Administered 2013-06-09: 20 ug/min via INTRAVENOUS

## 2013-06-09 MED ORDER — OXYCODONE-ACETAMINOPHEN 5-325 MG PO TABS
1.0000 | ORAL_TABLET | ORAL | Status: DC | PRN
  Administered 2013-06-09 – 2013-06-11 (×4): 2 via ORAL
  Administered 2013-06-12: 1 via ORAL
  Filled 2013-06-09 (×4): qty 2
  Filled 2013-06-09: qty 1
  Filled 2013-06-09: qty 2

## 2013-06-09 MED ORDER — MAGNESIUM HYDROXIDE 400 MG/5ML PO SUSP
30.0000 mL | Freq: Every day | ORAL | Status: DC | PRN
  Administered 2013-06-12: 30 mL via ORAL
  Filled 2013-06-09: qty 30

## 2013-06-09 MED ORDER — PROMETHAZINE HCL 25 MG PO TABS: 12.5000 mg | ORAL_TABLET | ORAL | Status: DC | PRN

## 2013-06-09 MED ORDER — MIDAZOLAM HCL 5 MG/5ML IJ SOLN
INTRAMUSCULAR | Status: DC | PRN
  Administered 2013-06-09: 1 mg via INTRAVENOUS
  Administered 2013-06-09: 2 mg via INTRAVENOUS

## 2013-06-09 MED ORDER — 0.9 % SODIUM CHLORIDE (POUR BTL) OPTIME
TOPICAL | Status: DC | PRN
  Administered 2013-06-09: 1000 mL

## 2013-06-09 MED ORDER — ALBUMIN HUMAN 5 % IV SOLN
INTRAVENOUS | Status: DC | PRN
  Administered 2013-06-09: 18:00:00 via INTRAVENOUS

## 2013-06-09 MED ORDER — MORPHINE SULFATE 2 MG/ML IJ SOLN: 1.0000 mg | INTRAMUSCULAR | Status: DC | PRN

## 2013-06-09 MED ORDER — AMLODIPINE BESYLATE 5 MG PO TABS
5.0000 mg | ORAL_TABLET | Freq: Every day | ORAL | Status: DC
  Administered 2013-06-11 – 2013-06-14 (×4): 5 mg via ORAL
  Filled 2013-06-09 (×6): qty 1

## 2013-06-09 MED ORDER — FENTANYL CITRATE 0.05 MG/ML IJ SOLN
INTRAMUSCULAR | Status: DC | PRN
  Administered 2013-06-09: 150 ug via INTRAVENOUS
  Administered 2013-06-09 (×4): 50 ug via INTRAVENOUS
  Administered 2013-06-09: 100 ug via INTRAVENOUS
  Administered 2013-06-09: 50 ug via INTRAVENOUS

## 2013-06-09 MED ORDER — SODIUM CHLORIDE 0.9 % IJ SOLN
3.0000 mL | INTRAMUSCULAR | Status: DC | PRN
  Administered 2013-06-11: 3 mL via INTRAVENOUS

## 2013-06-09 SURGICAL SUPPLY — 60 items
APL SKNCLS STERI-STRIP NONHPOA (GAUZE/BANDAGES/DRESSINGS) ×1
BAG DECANTER FOR FLEXI CONT (MISCELLANEOUS) ×2 IMPLANT
BANDAGE GAUZE ELAST BULKY 4 IN (GAUZE/BANDAGES/DRESSINGS) ×4 IMPLANT
BENZOIN TINCTURE PRP APPL 2/3 (GAUZE/BANDAGES/DRESSINGS) ×2 IMPLANT
BIT DRILL 14MM (INSTRUMENTS) ×1 IMPLANT
BUR MATCHSTICK NEURO 3.0 LAGG (BURR) ×2 IMPLANT
CAGE CORPECTOMY 34MM (Cage) ×2 IMPLANT
CANISTER SUCTION 2500CC (MISCELLANEOUS) ×2 IMPLANT
CLOTH BEACON ORANGE TIMEOUT ST (SAFETY) ×2 IMPLANT
CONT SPEC 4OZ CLIKSEAL STRL BL (MISCELLANEOUS) ×2 IMPLANT
DRAPE LAPAROTOMY 100X72 PEDS (DRAPES) ×2 IMPLANT
DRAPE MICROSCOPE LEICA (MISCELLANEOUS) ×2 IMPLANT
DRAPE POUCH INSTRU U-SHP 10X18 (DRAPES) ×2 IMPLANT
DRESSING TELFA 8X3 (GAUZE/BANDAGES/DRESSINGS) ×2 IMPLANT
DRILL 14MM (INSTRUMENTS) ×2
DURAPREP 6ML APPLICATOR 50/CS (WOUND CARE) ×2 IMPLANT
DURASEAL APPLICATOR TIP (TIP) ×2 IMPLANT
DURASEAL SPINE SEALANT 3ML (MISCELLANEOUS) ×2 IMPLANT
ELECT REM PT RETURN 9FT ADLT (ELECTROSURGICAL) ×2
ELECTRODE REM PT RTRN 9FT ADLT (ELECTROSURGICAL) ×1 IMPLANT
GAUZE SPONGE 4X4 16PLY XRAY LF (GAUZE/BANDAGES/DRESSINGS) IMPLANT
GLOVE BIO SURGEON STRL SZ8.5 (GLOVE) ×2 IMPLANT
GLOVE BIOGEL PI IND STRL 7.0 (GLOVE) ×2 IMPLANT
GLOVE BIOGEL PI INDICATOR 7.0 (GLOVE) ×2
GLOVE ECLIPSE 7.5 STRL STRAW (GLOVE) ×2 IMPLANT
GLOVE EXAM NITRILE LRG STRL (GLOVE) IMPLANT
GLOVE EXAM NITRILE MD LF STRL (GLOVE) IMPLANT
GLOVE EXAM NITRILE XL STR (GLOVE) IMPLANT
GLOVE EXAM NITRILE XS STR PU (GLOVE) IMPLANT
GLOVE SS BIOGEL STRL SZ 8 (GLOVE) ×1 IMPLANT
GLOVE SUPERSENSE BIOGEL SZ 8 (GLOVE) ×1
GLOVE SURG SS PI 7.0 STRL IVOR (GLOVE) ×4 IMPLANT
GOWN BRE IMP SLV AUR LG STRL (GOWN DISPOSABLE) ×2 IMPLANT
GOWN BRE IMP SLV AUR XL STRL (GOWN DISPOSABLE) ×4 IMPLANT
GOWN STRL REIN 2XL LVL4 (GOWN DISPOSABLE) IMPLANT
HEAD HALTER (SOFTGOODS) ×2 IMPLANT
KIT BASIN OR (CUSTOM PROCEDURE TRAY) ×2 IMPLANT
KIT ROOM TURNOVER OR (KITS) ×2 IMPLANT
NEEDLE HYPO 22GX1.5 SAFETY (NEEDLE) IMPLANT
NEEDLE HYPO 25X1 1.5 SAFETY (NEEDLE) ×2 IMPLANT
NEEDLE SPNL 20GX3.5 QUINCKE YW (NEEDLE) ×4 IMPLANT
NS IRRIG 1000ML POUR BTL (IV SOLUTION) ×2 IMPLANT
PACK LAMINECTOMY NEURO (CUSTOM PROCEDURE TRAY) ×2 IMPLANT
PAD ARMBOARD 7.5X6 YLW CONV (MISCELLANEOUS) ×6 IMPLANT
PATTIES SURGICAL .5 X.5 (GAUZE/BANDAGES/DRESSINGS) ×4 IMPLANT
PATTIES SURGICAL .75X.75 (GAUZE/BANDAGES/DRESSINGS) ×4 IMPLANT
PIN DISTRACTION 14MM (PIN) ×4 IMPLANT
PLATE 60MM (Plate) ×2 IMPLANT
PUTTY BONE 2.5CC ×2 IMPLANT
RUBBERBAND STERILE (MISCELLANEOUS) ×4 IMPLANT
SCREW 14MM (Screw) ×12 IMPLANT
SPONGE GAUZE 4X4 12PLY (GAUZE/BANDAGES/DRESSINGS) ×2 IMPLANT
SPONGE INTESTINAL PEANUT (DISPOSABLE) ×2 IMPLANT
STRIP CLOSURE SKIN 1/2X4 (GAUZE/BANDAGES/DRESSINGS) ×2 IMPLANT
SUT VIC AB 3-0 SH 8-18 (SUTURE) ×4 IMPLANT
SYR 20ML ECCENTRIC (SYRINGE) ×2 IMPLANT
TOWEL OR 17X24 6PK STRL BLUE (TOWEL DISPOSABLE) ×2 IMPLANT
TOWEL OR 17X26 10 PK STRL BLUE (TOWEL DISPOSABLE) ×2 IMPLANT
TRAP SPECIMEN MUCOUS 40CC (MISCELLANEOUS) ×2 IMPLANT
WATER STERILE IRR 1000ML POUR (IV SOLUTION) ×2 IMPLANT

## 2013-06-09 NOTE — Anesthesia Preprocedure Evaluation (Addendum)
Anesthesia Evaluation  Patient identified by MRN, date of birth, ID band Patient awake    Reviewed: Allergy & Precautions, H&P , NPO status , Patient's Chart, lab work & pertinent test results  History of Anesthesia Complications Negative for: history of anesthetic complications  Airway Mallampati: II TM Distance: >3 FB Neck ROM: Full    Dental  (+) Missing, Poor Dentition and Dental Advisory Given   Pulmonary neg pulmonary ROS,  breath sounds clear to auscultation        Cardiovascular hypertension, Pt. on medications Rhythm:Regular Rate:Normal  Echo 10/29/09 Study Conclusions   - Left ventricle: The cavity size was normal. Wall thickness was    increased in a pattern of mild LVH. Systolic function was normal.    The estimated ejection fraction was in the range of 60% to 65%.    Wall motion was normal; there were no regional wall motion    abnormalities.  - Aortic valve: Mildly calcified annulus. Trileaflet; normal    thickness leaflets.  - Pericardium, extracardiac: A trivial pericardial effusion was    identified.  Transthoracic echocardiography   Neuro/Psych PSYCHIATRIC DISORDERS Anxiety Depression  Neuromuscular disease (occipital headache with sev cervical stenosis. LUE>RUE pain and  weakness.)    GI/Hepatic GERD-  ,(+)     substance abuse (h/o of ETOH abuse and smoking crack cocaine)  alcohol use, Hepatitis -, CHistory of hepatitis C based on hepatitis C antibody being positive as best can be determined from available records.  She has never had any kind of extensive evaluation.  H/o dysphagia    Endo/Other  Morbid obesity  Renal/GU Renal InsufficiencyRenal disease (Cr 1.80)  negative genitourinary   Musculoskeletal  (+) Arthritis -, Osteoarthritis,    Abdominal (+) + obese,   Peds  Hematology  (+) Blood dyscrasia, anemia ,   Anesthesia Other Findings   Reproductive/Obstetrics negative OB ROS                       Anesthesia Physical Anesthesia Plan  ASA: III  Anesthesia Plan: General   Post-op Pain Management:    Induction: Intravenous  Airway Management Planned: Oral ETT  Additional Equipment:   Intra-op Plan:   Post-operative Plan: Extubation in OR  Informed Consent: I have reviewed the patients History and Physical, chart, labs and discussed the procedure including the risks, benefits and alternatives for the proposed anesthesia with the patient or authorized representative who has indicated his/her understanding and acceptance.   Dental advisory given  Plan Discussed with: CRNA and Surgeon  Anesthesia Plan Comments:         Anesthesia Quick Evaluation

## 2013-06-09 NOTE — ED Provider Notes (Signed)
Medical screening examination/treatment/procedure(s) were performed by non-physician practitioner and as supervising physician I was immediately available for consultation/collaboration.  Richarda Blade, MD 06/09/13 [phone]

## 2013-06-09 NOTE — Interval H&P Note (Signed)
History and Physical Interval Note:  06/09/2013 1:25 PM  Colleen Nunez  has presented today for surgery, with the diagnosis of Cervical stenosis, Cervical spondylosis  The various methods of treatment have been discussed with the patient and family. After consideration of risks, benefits and other options for treatment, the patient has consented to  Procedure(s) with comments: C4 C5 Corpectomy with C6-7 Anterior cervical fusion/Peek cage/Trestle plate (N/A) - C4 C5 Corpectomy with C6-7 Anterior cervical fusion/Peek cage/Trestle plate as a surgical intervention .  The patient's history has been reviewed, patient examined, no change in status, stable for surgery.  I have reviewed the patient's chart and labs.  Questions were answered to the patient's satisfaction.     [name]

## 2013-06-09 NOTE — Progress Notes (Signed)
Her left kidney is bigger then right....no kidney function problem

## 2013-06-09 NOTE — Anesthesia Procedure Notes (Signed)
Procedure Name: Intubation Date/Time: 06/09/2013 2:26 PM Performed by: Luane School A Pre-anesthesia Checklist: Patient identified, Emergency Drugs available, Suction available and Patient being monitored Patient Re-evaluated:Patient Re-evaluated prior to inductionOxygen Delivery Method: Circle system utilized Preoxygenation: Pre-oxygenation with 100% oxygen Intubation Type: IV induction Ventilation: Mask ventilation without difficulty Grade View: Grade I Tube type: Oral Tube size: 7.0 mm Number of attempts: 1 Airway Equipment and Method: Video-laryngoscopy and Rigid stylet Placement Confirmation: ETT inserted through vocal cords under direct vision,  positive ETCO2 and breath sounds checked- equal and bilateral Secured at: 22 cm Tube secured with: Tape Dental Injury: Teeth and Oropharynx as per pre-operative assessment  Comments: Surgeon request glidescope intubation r/t sev cervical stenosis

## 2013-06-09 NOTE — H&P (Signed)
See H& P.

## 2013-06-09 NOTE — Anesthesia Postprocedure Evaluation (Signed)
  Anesthesia Post-op Note  Patient: Colleen Nunez  Procedure(s) Performed: Procedure(s) with comments: C4 C5 Corpectomy with C6-7 Anterior cervical fusion/Peek cage/Trestle plate (N/A) - Cervical Four, Cervical Five Corpectomy and Cervical six-seven Anterior cervical fusion/Peek cage Three-Five /Trestle Plating Cervical Three to Cervical Seven  Patient Location: PACU  Anesthesia Type:General  Level of Consciousness: awake, alert , oriented and patient cooperative  Airway and Oxygen Therapy: Patient Spontanous Breathing and Patient connected to nasal cannula oxygen  Post-op Pain: mild  Post-op Assessment: Post-op Vital signs reviewed, Patient's Cardiovascular Status Stable, Respiratory Function Stable, Patent Airway, No signs of Nausea or vomiting and Pain level controlled  Post-op Vital Signs: Reviewed and stable  Complications: No apparent anesthesia complications

## 2013-06-09 NOTE — Op Note (Signed)
06/09/2013  7:08 PM  PATIENT:  Colleen Nunez  57 y.o. female  PRE-OPERATIVE DIAGNOSIS:  Cervical stenosis, Cervical spondylosis, OPLL , myelopathy  POST-OPERATIVE DIAGNOSIS:  Cervical stenosis, Cervical spondylosis,  OPLL , myelopathy   PROCEDURE:  Procedure(s): C4 and  C5 Corpectomy and  C6-7 Anterior cervical discectomy and  Fusion C3-4, 4-5, 5-6, 6-7,  /Peek cage C3-4, 4-5, 5-6  (C3-6) , structural allograft C6-7 , ttrestle plate C3-7 ,  microdisection with microscope  SURGEON:  Surgeon(s): Otilio Connors, MD Ophelia Charter, MD-assist    ANESTHESIA:   general  EBL:     BLOOD ADMINISTERED:none  DRAINS: none   SPECIMEN:  No Specimen  DICTATION: Patient to-year-old woman with 2 weeks of neck pain worse over this. Time to return term CT and MRI was done showing severe spinal changes of cervical spinal stenosis cord compression cord change sent off his shoes no radicular symptoms but to the examiner she's very myelopathic and this again cord change at multiple cervical spine due to her stenosis and the OPLL and spinal changes after much discussion the patient is decided to proceed with surgical intervention starting cervical corpectomy C4 on in the see 37 and lateral fusion C3-7 with potentially more surgery in the future depending on how she heals and how she does.  Patient brought in from general anesthesia induced patient was placed in 10 pounds halter traction prepped draped sterile fashion segments inject with 20 symptoms of locking with epinephrine incision was then made obliquely with left-sided neck the anterior to the sternocleidomastoid muscle incision taken down to the platysma hemostasis obtained with Bovie cauterization the platysma was incised and blunt dissection taken anterior cervical spine. Needles were placed into interspace x-rays attention needle was at the 4556 spaces these disc spaces were incised tractors must which removed and partial discectomy done as the  needle was removed lungs: Muscles reflected laterally from C3-7 bilaterally cementing retractors were placed we can see the C3-6 levels. We also incised the disc at C3-4 and started discectomy there were placed traction pins in C3 and C6 placed a distractor and distracted the interspace is microscope was brought in for microdissection. Corpectomy was then done in C4 and 5 removing the vertebral bodies with Leksell rongeurs Kerrison punches high-speed drills of bone was saved for use in for later in the case packing of the cage appeared hemostasis obtained with Gelfoam. A dissected down towards the dura and decompressing the central canal and a very calcified ligament the ligament dura was seen to be test together and for most of the dissection the thin layer subarachnoid the left over the spinal cord most of the dura and being removed we did a good decompression from the bottom of C3 the top of C6 decompression of foramen at each of the 3 disc spaces [id number]. It hemostasis with Gelfoam thrombin measured height of the a vertebral defect in the packed in peek cage with the autograft bone we placed tissue glue over the arachnoid and dural surface and placed the peek cage packed with autograft bone into the defect tapped into position. Next the decompression was recovered in for microdissection technique to decompress the spinal cord. Peek spacers in good position the distraction was removed and the pin was removed from C3. We then moved her retractor transferred into the 67 level removing osteophytes incise the disc space placed a distraction pin at C7 and distracted from 6-7. We then continued the discectomy with a curettes and and 1  to her Kerrison punches decompressing the central canal spinal cord and bilateral nerve roots. We measured height a displaced to be 7 mm structural allograft was tapped in place 67 level. The distraction and distraction pins removed hemostasis obtained with Gelfoam thrombin the peek spacer  and patellar bone was in good firm position all these levels and a Tressel anterior cervical plate was placed over the intervertebral spine. 2 screws placed the C3-2 screws in C6 and 2 screws in the C7 these were final tightened lateral x-rays obtained showing good position plate-screw bone plug and the peek spacer. We. About solution we had very good hemostasis and then the platysma was closed through Vicryl interrupted sutures subcutaneous incision closed the same skin closed benzoin Steri-Strips dressing was placed patient placed in a hard cervical collar woken from anesthesia and transferred recovery   PLAN OF CARE: Admit to inpatient   PATIENT DISPOSITION:  PACU - hemodynamically stable.

## 2013-06-09 NOTE — Transfer of Care (Signed)
Immediate Anesthesia Transfer of Care Note  Patient: Colleen Nunez  Procedure(s) Performed: Procedure(s) with comments: C4 C5 Corpectomy with C6-7 Anterior cervical fusion/Peek cage/Trestle plate (N/A) - Cervical Four, Cervical Five Corpectomy and Cervical six-seven Anterior cervical fusion/Peek cage Three-Five /Trestle Plating Cervical Three to Cervical Seven  Patient Location: PACU  Anesthesia Type:General  Level of Consciousness: awake, alert , oriented and patient cooperative  Airway & Oxygen Therapy: Patient Spontanous Breathing and Patient connected to nasal cannula oxygen  Post-op Assessment: Report given to PACU RN, Post -op Vital signs reviewed and stable and Patient moving all extremities X 4  Post vital signs: Reviewed and stable  Complications: No apparent anesthesia complications

## 2013-06-09 NOTE — Preoperative (Signed)
Beta Blockers   Reason not to administer Beta Blockers:Not Applicable 

## 2013-06-10 MED FILL — Mupirocin Oint 2%: CUTANEOUS | Qty: 22 | Status: AC

## 2013-06-10 NOTE — Clinical Social Work Note (Signed)
Clinical Social Worker received referral for possible ST-SNF placement.  Per RN patient is up in the chair and seems to be back to her baseline.  PT/OT consults pending for potential home health needs.  CSW signing off - please re consult if social work needs arise.  Barbette Or, [place]

## 2013-06-10 NOTE — Progress Notes (Signed)
Subjective: Patient reports trouble urinating, not OOB yet  Objective: Vital signs in last 24 hours: Temp:  [97 F (36.1 C)-98.5 F (36.9 C)] 98.5 F (36.9 C) (07/25 0700) Pulse Rate:  [47-81] 50 (07/25 0600) Resp:  [9-19] 19 (07/25 0600) BP: (94-134)/(61-94) 127/81 mmHg (07/25 0600) SpO2:  [96 %-100 %] 100 % (07/25 0600) Weight:  [79.578 kg (175 lb 7 oz)] 79.578 kg (175 lb 7 oz) (07/24 1133)  Intake/Output from previous day: 07/24 0701 - 07/25 0700 In: 3945 [P.O.:320; I.V.:3275; IV Piggyback:350] Out: 1400 [Urine:1050; Blood:350] Intake/Output this shift:   Voice ok, swallowing well ,  Moving all 4 well , myelopathy stable Wound:c/d/i  Lab Results:  Recent Labs  06/09/13 1204  WBC 9.2  HGB 10.9*  HCT 32.2*  PLT 208   BMET  Recent Labs  06/09/13 1204  NA 141  K 3.8  CL 106  CO2 26  GLUCOSE 101*  BUN 26*  CREATININE 1.62*  CALCIUM 9.5    Studies/Results: Dg Chest 2 View  06/09/2013   *RADIOLOGY REPORT*  Clinical Data: Preoperative chest radiograph.  Hypertension.  CHEST - 2 VIEW  Comparison: 09/26/2009  Findings: The cardiac silhouette is normal in size and configuration. The mediastinum is normal in contour caliber.  The lungs are clear.  No pleural effusion or pneumothorax.  The bony thorax is intact.  IMPRESSION: No active disease of the chest.   Original Report Authenticated By: Lajean Manes, M.D.   Dg Cervical Spine 2-3 Views  06/09/2013   *RADIOLOGY REPORT*  Clinical Data: Post C6 - C7 ACDF and C4 - C5 corpectomy  CERVICAL SPINE - 2-3 VIEW  Comparison: Cervical spine MRI - 06/03/2013  Findings:  Three spot lateral intraoperative radiographic images of the cervical spine are provided for review.  Image labeled #1 demonstrates surgical marking instrument tips overlying the soft tissues anterior to the C4 - C5 and C5 - C6 intervertebral disc spaces.  An endotracheal tube overlies the tracheal air column with tip excluded from view.  Enteric tube tip is also  excluded from view.  The two subsequent radiographs demonstrate the sequela of C3 - C7 ACDF with corpal screws at C3, C6 and C7.  Linear surgical support apparatus is seen overlying the prevertebral soft tissues.  No definite radiopaque foreign body.  IMPRESSION: Post C3 - C7 ACDF with corpal screws seen at C3, C6 and C7.   Original Report Authenticated By: Jake Seats, MD    Assessment/Plan: Increase activity  - transfer to floor later today   LOS: 1 day     Jarold Macomber R, MD 06/10/2013, 8:32 AM

## 2013-06-10 NOTE — Progress Notes (Signed)
Physical Therapy Evaluation Patient Details Name: Colleen Nunez MRN: [id number] DOB: 11-01-1956 Today's Date: 06/10/2013 Time: [id number] PT Time Calculation (min): 25 min  PT Assessment / Plan / Recommendation History of Present Illness  Pt admit with C4-5 corpectomy and C6-7 ACF.    Clinical Impression  Pt admitted with cervical surgery. Pt currently with functional limitations due to the deficits listed below (see PT Problem List).   Should progress well and will need only HHPT f/u and 4 wheeled RW with seat.  Son will assist pt at d/c.  Pt will benefit from skilled PT to increase their independence and safety with mobility to allow discharge to the venue listed below.     PT Assessment  Patient needs continued PT services    Follow Up Recommendations  Home health PT;Supervision/Assistance - 24 hour                Equipment Recommendations  Other (comment) (4 wheeled RW with seat)         Frequency Min 5X/week    Precautions / Restrictions Precautions Precautions: Fall;Cervical Required Braces or Orthoses: Cervical Brace Cervical Brace: Hard collar;At all times Restrictions Weight Bearing Restrictions: No   Pertinent Vitals/Pain VSS, No pain      Mobility  Bed Mobility Bed Mobility: Not assessed Transfers Transfers: Sit to Stand;Stand to Sit Sit to Stand: 4: Min assist;With upper extremity assist;From chair/3-in-1;With armrests Stand to Sit: 4: Min assist;With upper extremity assist;To chair/3-in-1;With armrests Details for Transfer Assistance: cues for hand placement. Ambulation/Gait Ambulation/Gait Assistance: 4: Min assist Ambulation Distance (Feet): 150 Feet Assistive device: Rolling walker Ambulation/Gait Assistance Details: Pt ambulated with RW with occasional cues to stay close to RW and for technique with RW.  Also needed cues for upright posture.  Gait Pattern: Step-through pattern;Trunk flexed Gait velocity: decreased Stairs: No Wheelchair  Mobility Wheelchair Mobility: No         PT Diagnosis: Generalized weakness  PT Problem List: Decreased activity tolerance;Decreased balance;Decreased mobility;Decreased knowledge of use of DME;Decreased safety awareness;Decreased knowledge of precautions PT Treatment Interventions: DME instruction;Gait training;Functional mobility training;Stair training;Therapeutic activities;Therapeutic exercise;Balance training;Patient/family education     PT Goals(Current goals can be found in the care plan section) Acute Rehab PT Goals Patient Stated Goal: to go home PT Goal Formulation: With patient Time For Goal Achievement: 06/17/13 Potential to Achieve Goals: Good  Visit Information  Last PT Received On: 06/10/13 Assistance Needed: +1 History of Present Illness: Pt admit with C4-5 corpectomy and C6-7 ACF.         Prior [place] expects to be discharged to:: Private residence Living Arrangements: Children Available Help at Discharge: Family;Available 24 hours/day Type of Home: House Home Access: Stairs to enter [employer] of Steps: 3 Entrance Stairs-Rails: Right Home Layout: One level Home Equipment: Bedside commode;Cane - single point Prior Function Level of Independence: Independent with assistive device(s) Communication Communication: No difficulties    Cognition  Cognition Arousal/Alertness: Awake/alert Behavior During Therapy: WFL for tasks assessed/performed Overall Cognitive Status: Within Functional Limits for tasks assessed    Extremity/Trunk Assessment Upper Extremity Assessment Upper Extremity Assessment: Defer to OT evaluation Lower Extremity Assessment Lower Extremity Assessment: Generalized weakness Cervical / Trunk Assessment Cervical / Trunk Assessment: Kyphotic   Balance Balance Balance Assessed: Yes Static Standing Balance Static Standing - Balance Support: Left upper extremity supported;During functional  activity Static Standing - Level of Assistance: 4: Min assist Static Standing - Comment/# of Minutes: Pt static stance with RW with left  hand onRw and reached back to flush toilet with slight LOB needing steadying assist.   End of Session PT - End of Session Equipment Utilized During Treatment: Gait belt Activity Tolerance: Patient tolerated treatment well Patient left: in chair;with call bell/phone within reach Nurse Communication: Mobility status       INGOLD,Margarett Viti 06/10/2013, 4:10 PM  [facility] Acute Rehabilitation [phone] (pager)

## 2013-06-10 NOTE — Progress Notes (Signed)
UR completed 

## 2013-06-11 NOTE — Evaluation (Signed)
Occupational Therapy Evaluation Patient Details Name: Colleen Nunez MRN: [id number] DOB: 06-10-1956 Today's Date: 06/11/2013 Time: [id number] OT Time Calculation (min): 19 min  OT Assessment / Plan / Recommendation History of present illness Pt admit with C4-5 corpectomy and C6-7 ACDF.     Clinical Impression   Pt admitted with above.  Pt will benefit from continued acute OT services to address below problem list in prep for return home with family.    OT Assessment  Patient needs continued OT Services    Follow Up Recommendations  Home health OT;Supervision/Assistance - 24 hour    Barriers to Discharge      Equipment Recommendations  Tub/shower bench    Recommendations for Other Services    Frequency  Min 2X/week    Precautions / Restrictions Precautions Precautions: Fall;Cervical Required Braces or Orthoses: Cervical Brace Cervical Brace: Hard collar;At all times Restrictions Weight Bearing Restrictions: No   Pertinent Vitals/Pain See vitals    ADL  Eating/Feeding: Performed;Modified independent Where Assessed - Eating/Feeding: Chair Grooming: Performed;Wash/dry hands;Supervision/safety Where Assessed - Grooming: Unsupported standing Upper Body Bathing: Simulated;Set up Where Assessed - Upper Body Bathing: Unsupported sitting Lower Body Bathing: Simulated;Minimal assistance Where Assessed - Lower Body Bathing: Unsupported sit to stand Upper Body Dressing: Performed;Minimal assistance Where Assessed - Upper Body Dressing: Unsupported sitting Lower Body Dressing: Performed;Moderate assistance Where Assessed - Lower Body Dressing: Unsupported sit to stand Toilet Transfer: Performed;Min guard Toilet Transfer Method:  (ambulating) Science writer: Comfort height toilet Toileting - Clothing Manipulation and Hygiene: Independent;Min guard Where Assessed - Toileting Clothing Manipulation and Hygiene: Sit to stand from 3-in-1 or toilet Equipment Used: Gait  belt;Rolling walker Transfers/Ambulation Related to ADLs: min guard with RW ADL Comments: Incr time due to pain    OT Diagnosis: Generalized weakness;Acute pain  OT Problem List: Decreased strength;Decreased activity tolerance;Decreased knowledge of use of DME or AE;Decreased knowledge of precautions;Pain OT Treatment Interventions: Self-care/ADL training;DME and/or AE instruction;Therapeutic activities;Patient/family education   OT Goals(Current goals can be found in the care plan section) Acute Rehab OT Goals Patient Stated Goal: to go home OT Goal Formulation: With patient Time For Goal Achievement: 06/18/13 Potential to Achieve Goals: Good  Visit Information  Last OT Received On: 06/11/13 Assistance Needed: +1 History of Present Illness: Pt admit with C4-5 corpectomy and C6-7 ACF.         Prior [place] expects to be discharged to:: Private residence Living Arrangements: Children Available Help at Discharge: Family;Available 24 hours/day Type of Home: House Home Access: Stairs to enter [employer] of Steps: 3 Entrance Stairs-Rails: Right Home Layout: One level Home Equipment: Bedside commode;Cane - single point Prior Function Level of Independence: Independent with assistive device(s) Communication Communication: No difficulties Dominant Hand: Right         Vision/Perception     Cognition  Cognition Arousal/Alertness: Awake/alert Behavior During Therapy: WFL for tasks assessed/performed Overall Cognitive Status: Within Functional Limits for tasks assessed    Extremity/Trunk Assessment Upper Extremity Assessment Upper Extremity Assessment: Generalized weakness;RUE deficits/detail;LUE deficits/detail RUE Deficits / Details: H/o arthritis LUE Deficits / Details: H/o arthritis     Mobility Bed Mobility Bed Mobility: Sit to Sidelying Left Sit to Sidelying Left: 4: Min assist Details for Bed Mobility Assistance:  Assist to adhere to log roll technique and to avoid twisting or straining neck. Transfers Transfers: Sit to Stand;Stand to Sit Sit to Stand: 4: Min guard;From toilet;From chair/3-in-1;With upper extremity assist;With armrests Stand to Sit: 4: Min  guard;To toilet;To bed;With upper extremity assist Details for Transfer Assistance: VCs for safe hand placement     Exercise     Balance     End of Session OT - End of Session Equipment Utilized During Treatment: Cervical collar;Rolling walker Activity Tolerance: Patient tolerated treatment well Patient left: in bed;with call bell/phone within reach;with bed alarm set Nurse Communication: Mobility status  GO   06/11/2013 Darrol Jump OTR/L Pager [phone] Office [phone]   Darrol Jump 06/11/2013, 4:01 PM

## 2013-06-11 NOTE — Progress Notes (Signed)
Patient ID: Colleen Nunez, female   DOB: 03-15-1956, 57 y.o.   MRN: [id number] BP 132/84  Pulse 68  Temp(Src) 98.3 F (36.8 C) (Oral)  Resp 20  Ht 5\' 2"  (1.575 m)  Wt 86.5 kg (190 lb 11.2 oz)  BMI 34.87 kg/m2  SpO2 99% Alert and oriented x 4 Speech is clear, voice is mildly hoarse Moving upper extremities well, ambulating with the walker Wound is clean, dry, and without signs of infection Working with PT, improved from yesterday

## 2013-06-11 NOTE — Progress Notes (Signed)
Physical Therapy Treatment Patient Details Name: Colleen Nunez MRN: [id number] DOB: [birth year] Today's Date: 06/11/2013 Time: [id number] PT Time Calculation (min): 18 min  PT Assessment / Plan / Recommendation  History of Present Illness Pt admit with C4-5 corpectomy and C6-7 ACF.        PT Comments   Pt pleasant & willing to participate in PT session.  Moves fairly well but cont to recommend HHPT, 4 wheeled RW, & Supervision/assistance at home at d/c.      Follow Up Recommendations  Home health PT;Supervision/Assistance - 24 hour     Does the patient have the potential to tolerate intense rehabilitation     Barriers to Discharge        Equipment Recommendations  Other (comment) (4 wheeled RW)    Recommendations for Other Services    Frequency Min 5X/week   Progress towards PT Goals Progress towards PT goals: Progressing toward goals  Plan Current plan remains appropriate    Precautions / Restrictions Precautions Precautions: Fall;Cervical Required Braces or Orthoses: Cervical Brace Cervical Brace: Hard collar;At all times Restrictions Weight Bearing Restrictions: No   Pertinent Vitals/Pain Denies pain in neck or shoulders but c/o pain in Lt knee.      Mobility  Bed Mobility Bed Mobility: Not assessed Transfers Transfers: Sit to Stand;Stand to Sit Sit to Stand: 4: Min guard;With upper extremity assist;With armrests;From chair/3-in-1 Stand to Sit: 4: Min guard;With upper extremity assist;With armrests;To chair/3-in-1 Details for Transfer Assistance: Cues for safest hand placement Ambulation/Gait Ambulation/Gait Assistance: 4: Min guard Ambulation Distance (Feet): 200 Feet Assistive device: Rolling walker Ambulation/Gait Assistance Details: Cues to relax shoulders & to stay closer to RW for support.  Pt tends to stay close to wall & objects on Lt side, requiring cues to give herself more space between herself/RW & objects on Lt.   Gait Pattern: Step-through  pattern;Decreased stride length Gait velocity: decreased General Gait Details: Pt reports Lt knee is "bad" & has given her problems for years.   Stairs: No Wheelchair Mobility Wheelchair Mobility: No      PT Goals (current goals can now be found in the care plan section) Acute Rehab PT Goals PT Goal Formulation: With patient Time For Goal Achievement: 06/17/13 Potential to Achieve Goals: Good  Visit Information  Last PT Received On: 06/11/13 Assistance Needed: +1 History of Present Illness: Pt admit with C4-5 corpectomy and C6-7 ACF.      Subjective Data      Cognition  Cognition Arousal/Alertness: Awake/alert Behavior During Therapy: WFL for tasks assessed/performed Overall Cognitive Status: Within Functional Limits for tasks assessed    Balance     End of Session PT - End of Session Equipment Utilized During Treatment: Gait belt;Cervical collar Activity Tolerance: Patient tolerated treatment well Patient left: in chair;with call bell/phone within reach Nurse Communication: Mobility status   GP     Sena Hitch 06/11/2013, 1:42 PM   Sarajane Marek, PTA [phone] 06/11/2013

## 2013-06-12 NOTE — Progress Notes (Signed)
Physical Therapy Treatment Patient Details Name: Colleen Nunez MRN: [id number] DOB: 28-Jun-1956 Today's Date: 06/12/2013 Time: [id number] PT Time Calculation (min): 23 min  PT Assessment / Plan / Recommendation  History of Present Illness Pt admit with C4-5 corpectomy and C6-7 ACF.        PT Comments   Pt moves fairly well.  Performed stairs this session but will need to practice again before d/cing home.    Follow Up Recommendations  Home health PT;Supervision/Assistance - 24 hour     Does the patient have the potential to tolerate intense rehabilitation     Barriers to Discharge        Equipment Recommendations  Other (comment) (4 wheeled RW)    Recommendations for Other Services    Frequency Min 5X/week   Progress towards PT Goals Progress towards PT goals: Progressing toward goals  Plan Current plan remains appropriate    Precautions / Restrictions Precautions Precautions: Fall;Cervical Required Braces or Orthoses: Cervical Brace Cervical Brace: Hard collar;At all times Restrictions Weight Bearing Restrictions: No   Pertinent Vitals/Pain Denies pain in neck/shoulders.  Reports pain in Lt knee with ambulation & across lower abdomen.      Mobility  Bed Mobility Bed Mobility: Supine to Sit;Sitting - Scoot to Edge of Bed Supine to Sit: HOB elevated;With rails;5: Supervision Sitting - Scoot to Edge of Bed: 6: Modified independent (Device/Increase time) Details for Bed Mobility Assistance: Pt sitting with LE's out of bed & resting shoulders/trunk on bed with HOB elevated.   Transfers Transfers: Sit to Stand;Stand to Sit Sit to Stand: 5: Supervision;With upper extremity assist;From bed;From chair/3-in-1;With armrests Stand to Sit: 5: Supervision;With upper extremity assist;With armrests;To chair/3-in-1 Details for Transfer Assistance: cues to reinforce safest hand placement Ambulation/Gait Ambulation/Gait Assistance: 4: Min guard Ambulation Distance (Feet): 120  Feet Assistive device: Rolling walker Ambulation/Gait Assistance Details: Slow but steady gait.  Cues to stay closer to RW & to relax UE's.   Gait Pattern: Step-through pattern;Decreased stride length (decreased floor clearance) Gait velocity: decreased General Gait Details: Pt reports Lt knee is "bad" & has given her problems for years.   Stairs: Yes Stairs Assistance: 4: Min assist Stairs Assistance Details (indicate cue type and reason): (A) for balance & to ascend steps.  Cues for technique Stair Management Technique: One rail Right;Step to pattern;Sideways Number of Stairs: 2 Wheelchair Mobility Wheelchair Mobility: No      PT Goals (current goals can now be found in the care plan section) Acute Rehab PT Goals Patient Stated Goal: to go home PT Goal Formulation: With patient Time For Goal Achievement: 06/17/13 Potential to Achieve Goals: Good  Visit Information  Last PT Received On: 06/12/13 Assistance Needed: +1 History of Present Illness: Pt admit with C4-5 corpectomy and C6-7 ACF.      Subjective Data  Patient Stated Goal: to go home   Cognition  Cognition Arousal/Alertness: Awake/alert Behavior During Therapy: WFL for tasks assessed/performed Overall Cognitive Status: Within Functional Limits for tasks assessed    Balance  Balance Balance Assessed: No  End of Session PT - End of Session Equipment Utilized During Treatment: Gait belt;Cervical collar Activity Tolerance: Patient tolerated treatment well Patient left: in chair;with call bell/phone within reach;with chair alarm set Nurse Communication: Mobility status   GP     Sena Hitch 06/12/2013, 8:30 AM  Sarajane Marek, PTA [phone] 06/12/2013

## 2013-06-12 NOTE — Progress Notes (Signed)
Pt complaining of "spasms" in her back, states she takes a muscle relax at home when that happens,  Medicated with Flexeril, and repositioned in bed.  [place]

## 2013-06-12 NOTE — Progress Notes (Signed)
Pt states she has lower abdominal pain, when she has to urinate,  Denies burning, no frequencies just pressure in 'bladder".

## 2013-06-12 NOTE — Progress Notes (Signed)
Subjective: Patient reports Vital signs are stable. Patient complains of lower abdominal pain and cramping. Motor function is intact.  Objective: Vital signs in last 24 hours: Temp:  [98.2 F (36.8 C)-99.9 F (37.7 C)] 98.4 F (36.9 C) (07/27 1000) Pulse Rate:  [62-92] 76 (07/27 1000) Resp:  [18-20] 18 (07/27 1000) BP: (122-146)/(65-79) 132/77 mmHg (07/27 1000) SpO2:  [98 %-100 %] 98 % (07/27 1000)  Intake/Output from previous day: 07/26 0701 - 07/27 0700 In: 360 [P.O.:360] Out: 400 [Urine:400] Intake/Output this shift:    Incision is clean and dry. Motor function is intact in upper and lower extremities. Positive bowel sounds and abdomen.  Lab Results: No results found for this basename: WBC, HGB, HCT, PLT,  in the last 72 hours BMET No results found for this basename: NA, K, CL, CO2, GLUCOSE, BUN, CREATININE, CALCIUM,  in the last 72 hours  Studies/Results: No results found.  Assessment/Plan: Laxatives for lower abdominal discomfort.  LOS: 3 days  Encourage mobilization   Colleen Nunez 06/12/2013, 12:46 PM

## 2013-06-12 NOTE — Progress Notes (Signed)
Occupational Therapy Treatment Patient Details Name: Colleen Nunez MRN: [id number] DOB: Apr 30, 1956 Today's Date: 06/12/2013 Time: [id number] OT Time Calculation (min): 11 min  OT Assessment / Plan / Recommendation  History of present illness Pt admit with C4-5 corpectomy and C6-7 ACF.         OT comments  Pt is progressing with functional mobility.  Pt very fatigued after performing toileting tasks and unable to participate fully in AE education. Will return to progress AE education next session and to practice tub transfer.  Follow Up Recommendations  Home health OT;Supervision/Assistance - 24 hour    Barriers to Discharge       Equipment Recommendations  Tub/shower bench    Recommendations for Other Services    Frequency Min 2X/week   Progress towards OT Goals Progress towards OT goals: Progressing toward goals  Plan Discharge plan remains appropriate    Precautions / Restrictions Precautions Precautions: Fall;Cervical Required Braces or Orthoses: Cervical Brace Cervical Brace: Hard collar;At all times   Pertinent Vitals/Pain See vitals    ADL  Grooming: Performed;Wash/dry hands;Supervision/safety Where Assessed - Grooming: Unsupported standing Toilet Transfer: Chartered loss adjuster Method: Sit to Loss adjuster, chartered: Comfort height toilet Toileting - Clothing Manipulation and Hygiene: Performed;Min guard Where Assessed - Best boy and Hygiene: Standing Equipment Used: Rolling walker;Gait belt Transfers/Ambulation Related to ADLs: close supervision with RW  ADL Comments: Pt tends to lean very far forward during toileting hygiene, requiring min guard for safety.  Attempted AE education but pt very fatigued and tired and falling asleep.     OT Diagnosis:    OT Problem List:   OT Treatment Interventions:     OT Goals(current goals can now be found in the care plan section) Acute Rehab OT Goals Patient  Stated Goal: to go home OT Goal Formulation: With patient Time For Goal Achievement: 06/18/13 Potential to Achieve Goals: Good ADL Goals Pt Will Perform Lower Body Bathing: with supervision;sit to/from stand Pt Will Perform Lower Body Dressing: with supervision;with adaptive equipment;sit to/from stand Pt Will Transfer to Toilet: with supervision;ambulating;regular height toilet;bedside commode Pt Will Perform Toileting - Clothing Manipulation and hygiene: with supervision;sit to/from stand Pt Will Perform Tub/Shower Transfer: Tub transfer;with min guard assist;tub bench;rolling walker;ambulating  Visit Information  Last OT Received On: 06/12/13 Assistance Needed: +1 History of Present Illness: Pt admit with C4-5 corpectomy and C6-7 ACF.      Subjective Data      Prior Functioning       Cognition  Cognition Arousal/Alertness: Awake/alert Behavior During Therapy: WFL for tasks assessed/performed Overall Cognitive Status: Within Functional Limits for tasks assessed    Mobility  Bed Mobility Bed Mobility: Not assessed Transfers Transfers: Sit to Stand;Stand to Sit Sit to Stand: 5: Supervision;From chair/3-in-1;With upper extremity assist;With armrests Stand to Sit: 5: Supervision;To chair/3-in-1;With armrests;To toilet;With upper extremity assist Details for Transfer Assistance: cues to reinforce safest hand placement    Exercises      Balance     End of Session OT - End of Session Equipment Utilized During Treatment: Cervical collar;Rolling walker Activity Tolerance: Patient limited by fatigue;Patient limited by lethargy Patient left: in chair;with call bell/phone within reach Nurse Communication: Mobility status  GO    06/12/2013 Darrol Jump OTR/L Pager [phone] Office [phone]  Darrol Jump 06/12/2013, 1:02 PM

## 2013-06-12 NOTE — Progress Notes (Signed)
Patients reports abdominal cramping, also some back discomfort, no BM since surgery.  Given stool softeners and will give MOM.  [place]

## 2013-06-13 ENCOUNTER — Encounter (HOSPITAL_COMMUNITY): Payer: Self-pay | Admitting: Neurosurgery

## 2013-06-13 LAB — TYPE AND SCREEN
Antibody Screen: NEGATIVE
Unit division: 0

## 2013-06-13 LAB — URINALYSIS, ROUTINE W REFLEX MICROSCOPIC
Nitrite: NEGATIVE
Specific Gravity, Urine: 1.008 (ref 1.005–1.030)
Urobilinogen, UA: 0.2 mg/dL (ref 0.0–1.0)
pH: 6 (ref 5.0–8.0)

## 2013-06-13 LAB — URINE MICROSCOPIC-ADD ON

## 2013-06-13 MED ORDER — METRONIDAZOLE 500 MG PO TABS
2000.0000 mg | ORAL_TABLET | Freq: Once | ORAL | Status: AC
  Administered 2013-06-13: 2000 mg via ORAL
  Filled 2013-06-13: qty 4

## 2013-06-13 MED ORDER — TAMSULOSIN HCL 0.4 MG PO CAPS
0.4000 mg | ORAL_CAPSULE | Freq: Every day | ORAL | Status: DC
  Administered 2013-06-13 – 2013-06-14 (×2): 0.4 mg via ORAL
  Filled 2013-06-13 (×2): qty 1

## 2013-06-13 MED ORDER — BETHANECHOL CHLORIDE 25 MG PO TABS
25.0000 mg | ORAL_TABLET | Freq: Three times a day (TID) | ORAL | Status: DC
  Administered 2013-06-13 – 2013-06-14 (×2): 25 mg via ORAL
  Filled 2013-06-13 (×4): qty 1

## 2013-06-13 MED ORDER — TRIMETHOPRIM 100 MG PO TABS: 100.0000 mg | ORAL_TABLET | Freq: Every day | ORAL | Status: DC

## 2013-06-13 NOTE — Progress Notes (Signed)
Doing well. C/o appropriate incisional soreness. No Nausea /vomiting Amb/ Still trouble voiding  Temp:  [98 F (36.7 C)-98.6 F (37 C)] 98 F (36.7 C) (07/28 0602) Pulse Rate:  [71-93] 93 (07/28 0602) Resp:  [16-18] 16 (07/28 0602) BP: (121-155)/(65-93) 125/81 mmHg (07/28 0602) SpO2:  [98 %-100 %] 99 % (07/28 0602) Good strength and sensation Incision CDI  Plan: Increase activity  - I and O cath prn  - may need to place foley, start flomax Consider urology consult

## 2013-06-13 NOTE — Progress Notes (Signed)
PT Cancellation Note  Patient Details Name: YVES STEGEMOLLER MRN: [id number] DOB: May 22, 1956   Cancelled Treatment:    Reason Eval/Treat Not Completed: Fatigue/lethargy limiting ability to participate; pt sleeping on arrival. Stated she was too drowsy to attempt stair training and that she's not going home today due to bladder problems. She asked to defer repeat stair training until 06/14/13   Grayson White 06/13/2013, 3:14 PM Pager [phone]

## 2013-06-13 NOTE — Progress Notes (Signed)
Occupational Therapy Treatment Patient Details Name: Colleen Nunez MRN: [id number] DOB: Feb 27, 1956 Today's Date: 06/13/2013 Time: [id number] OT Time Calculation (min): 27 min  OT Assessment / Plan / Recommendation  History of present illness Pt admit with C4-5 corpectomy and C6-7 ACF.     Clinical Impression    OT comments  Pt limited at this time due to lower abdomen pain and discomfort. Pt unable to void bladder. Ot to continue to follow and next session focus fine motor exercise/ tub transfer. Pt will have (A) of son at d/c home.  Follow Up Recommendations  Home health OT;Supervision/Assistance - 24 hour    Barriers to Discharge       Equipment Recommendations  Tub/shower bench    Recommendations for Other Services    Frequency Min 2X/week   Progress towards OT Goals Progress towards OT goals: Progressing toward goals  Plan Discharge plan remains appropriate    Precautions / Restrictions Precautions Precautions: Fall;Cervical Required Braces or Orthoses: Cervical Brace Cervical Brace: Hard collar;At all times   Pertinent Vitals/Pain Discomfort lower abdomen    ADL  Eating/Feeding: Minimal assistance Where Assessed - Eating/Feeding: Chair ((A) opening containers) Grooming: Teeth care;Set up Where Assessed - Grooming: Unsupported standing Lower Body Dressing: Supervision/safety Where Assessed - Lower Body Dressing: Unsupported sit to stand (able to cross bil LE) Toilet Transfer: Supervision/safety Toilet Transfer Method: Sit to Loss adjuster, chartered: Raised toilet seat with arms (or 3-in-1 over toilet) Toileting - Clothing Manipulation and Hygiene: Supervision/safety Where Assessed - Toileting Clothing Manipulation and Hygiene: Sit to stand from 3-in-1 or toilet Equipment Used: Rolling walker;Gait belt;Other (comment) (ccollar) Transfers/Ambulation Related to ADLs: pt ambulating with RW with supervision and decr gait velocity ADL Comments: pt supine on  arrival c/o Lower abdomen pain. Pt reports inability to void bladder. Pt attempting toilet trasnfer to void and unsuccessful. MD Consuello Masse arriving to room and plans to start new medication for bladder. Rn Evea and tech Lattie Haw made aware of pts discomfort and need for a bladder scan. Pt completed grooming at sink level. pt noted to have find motor deficits with tearing and opening containers. Pt required assist with biotone and cereal container. Pt able to cross bil LE and touch toes. Pt with no need for AE at this time for LB dressing. pt has (A) of son upon d/c home. Pt eating in chair at end of session . Rn informed    OT Diagnosis:    OT Problem List:   OT Treatment Interventions:     OT Goals(current goals can now be found in the care plan section) Acute Rehab OT Goals Patient Stated Goal: to go home OT Goal Formulation: With patient Time For Goal Achievement: 06/18/13 Potential to Achieve Goals: Good ADL Goals Pt Will Perform Lower Body Bathing: with supervision;sit to/from stand Pt Will Perform Lower Body Dressing: with supervision;with adaptive equipment;sit to/from stand Pt Will Transfer to Toilet: with supervision;ambulating;regular height toilet;bedside commode Pt Will Perform Toileting - Clothing Manipulation and hygiene: with supervision;sit to/from stand Pt Will Perform Tub/Shower Transfer: Tub transfer;with min guard assist;tub bench;rolling walker;ambulating  Visit Information  Last OT Received On: 06/13/13 Assistance Needed: +1 History of Present Illness: Pt admit with C4-5 corpectomy and C6-7 ACF.      Subjective Data      Prior Functioning       Cognition  Cognition Arousal/Alertness: Awake/alert Behavior During Therapy: WFL for tasks assessed/performed Overall Cognitive Status: Within Functional Limits for tasks assessed    Mobility  Bed  Mobility Bed Mobility: Supine to Sit;Sitting - Scoot to Edge of Bed Supine to Sit: HOB flat;5: Supervision (mod v/c for sequence  due to surgery) Sitting - Scoot to Edge of Bed: 7: Independent Details for Bed Mobility Assistance: Pt with hob flat and simulated home environment. pt attempting initially to long sit. pt educated to avoid this sequence due to cervical precautions. pt educated on rolling to right side and pushign up on rt Ue. Pt with good return demo Transfers Sit to Stand: 5: Supervision;With upper extremity assist;From bed Stand to Sit: 5: Supervision;With upper extremity assist;To chair/3-in-1 Details for Transfer Assistance: cues for hand placement and safety    Exercises      Balance     End of Session OT - End of Session Equipment Utilized During Treatment: Cervical collar;Rolling walker Activity Tolerance: Patient limited by fatigue;Patient limited by lethargy Patient left: in chair;with call bell/phone within reach Nurse Communication: Mobility status  GO     Sharol Harness [facility] 06/13/2013, 9:29 AM Pager: [phone]

## 2013-06-13 NOTE — Progress Notes (Signed)
Pt still having lower abdominal pain. RN assisted pt to the bathroom because pt reported feeling like she had to urinate. Pt tried to urinate but was unable. RN bladder scanned pt and scan showed >999 cc of urine retained in bladder. An in-and-out cath was done on pt and 1700 cc of urine was drained out of bladder.  Pt's abdominal pain relieved. RN will continue to monitor and watch for void.

## 2013-06-13 NOTE — Progress Notes (Signed)
Foley cathater inserted without difficulties via sterile technique with immediate return of pale yellow urine.  [place]

## 2013-06-13 NOTE — Consult Note (Signed)
H&P Consult: Urinary retention Requested by: Dr. Luiz Ochoa  History of Present Illness:  This patient underwent C4-5 corpectomy and C6-7 ACF, postop day 4. She was not voiding postop and noted abdominal pressure. She was in and out cath last night for 1700 cc. She was in and out cath this morning for 1100 cc per her nurse. Her nurse reports to me she noted no prolapse or bulging mass per vagina when performing the in and out cath. The patient has not voided today. She was just up to the bathroom and tried to void but could not. Patient has required physical therapy for postop ambulation and is not yet ambulating at her baseline.  Prior to her C-spine surgery she said she struggled with constipation. She said for the most part she voided with a good stream but occasionally had a weak stream and had to drink extra fluids to generate a better flow. She denied urinary frequency or urgency. She denied daytime incontinence but on occasion had incontinence at night without awareness. She denied any dysuria or gross hematuria. She denied any symptoms of pelvic prolapse. Patient has had a hysterectomy in the past and 2 normal spontaneous vaginal deliveries.  Patient has chronic renal insufficiency but her creatinine is at baseline 1.6. Patient underwent renal ultrasound January 2011 for chronic renal insufficiency when her creatinine was around 2.  UA showed few bacteria and trichomonas.  Past Medical History  Diagnosis Date  . Hypertension   . Arthritis   . Hepatitis     hep c   Past Surgical History  Procedure Laterality Date  . Knee arthroscopy    . Abdominal hysterectomy    . Cartilage removal      left knee  . Anterior cervical corpectomy N/A 06/09/2013    Procedure: C4 C5 Corpectomy with C6-7 Anterior cervical fusion/Peek cage/Trestle plate;  Surgeon: Otilio Connors, MD;  Location: [facility];  Service: Neurosurgery;  Laterality: N/A;  Cervical Four, Cervical Five Corpectomy and Cervical  six-seven Anterior cervical fusion/Peek cage Three-Five /Trestle Plating Cervical Three to Cervical Seven    Home Medications:  Prescriptions prior to admission  Medication Sig Dispense Refill  . amLODipine (NORVASC) 5 MG tablet Take 5 mg by mouth daily.      Marland Kitchen HYDROcodone-acetaminophen (NORCO) 7.5-325 MG per tablet Take 1-2 tablets by mouth every 4 (four) hours as needed for pain. *TO BE CRUSHED* For pain      . HYDROcodone-acetaminophen (NORCO) 7.5-325 MG per tablet Take 1 tablet by mouth every 4 (four) hours as needed for pain.  20 tablet  0  . HYDROcodone-acetaminophen (NORCO) 7.5-325 MG per tablet Take 1 tablet by mouth every 4 (four) hours as needed for pain.  20 tablet  0  . ibuprofen (ADVIL,MOTRIN) 200 MG tablet Take 600 mg by mouth every 6 (six) hours as needed for pain.      . methocarbamol (ROBAXIN) 500 MG tablet Take 1 tablet (500 mg total) by mouth 3 (three) times daily.  21 tablet  0   Allergies:  Allergies  Allergen Reactions  . Aspirin     REACTION: Hx of overdose on this years ago - too scared to take. Tried to committ suicide.    History reviewed. No pertinent family history. Social History:  reports that she has never smoked. She does not have any smokeless tobacco history on file. She reports that she drinks about 1.8 ounces of alcohol per week. She reports that she does not use illicit drugs.  ROS: A complete review of systems was performed.  All systems are negative except for pertinent findings as noted. @ROS @   Physical Exam:  Vital signs in last 24 hours: Temp:  [98 F (36.7 C)-98.5 F (36.9 C)] 98.4 F (36.9 C) (07/28 1351) Pulse Rate:  [81-94] 87 (07/28 1351) Resp:  [16-20] 20 (07/28 1351) BP: (108-153)/(70-81) 108/70 mmHg (07/28 1351) SpO2:  [98 %-100 %] 99 % (07/28 1351) General:  Alert and oriented, No acute distress HEENT: Normocephalic, atraumatic Neck: No JVD or lymphadenopathy Cardiovascular: Regular rate and rhythm Lungs: Regular rate and  effort Abdomen: Soft, nontender, nondistended, no abdominal masses Back: No CVA tenderness Extremities: No edema Neurologic: Grossly intact, C-spine collar     Laboratory Data:  Results for orders placed during the hospital encounter of 06/09/13 (from the past 24 hour(s))  URINALYSIS, ROUTINE W REFLEX MICROSCOPIC     Status: Abnormal   Collection Time    06/13/13 10:23 AM      Result Value Range   Color, Urine YELLOW  YELLOW   APPearance CLEAR  CLEAR   Specific Gravity, Urine 1.008  1.005 - 1.030   pH 6.0  5.0 - 8.0   Glucose, UA NEGATIVE  NEGATIVE mg/dL   Hgb urine dipstick NEGATIVE  NEGATIVE   Bilirubin Urine NEGATIVE  NEGATIVE   Ketones, ur NEGATIVE  NEGATIVE mg/dL   Protein, ur NEGATIVE  NEGATIVE mg/dL   Urobilinogen, UA 0.2  0.0 - 1.0 mg/dL   Nitrite NEGATIVE  NEGATIVE   Leukocytes, UA SMALL (*) NEGATIVE  URINE MICROSCOPIC-ADD ON     Status: None   Collection Time    06/13/13 10:23 AM      Result Value Range   Squamous Epithelial / LPF RARE  RARE   WBC, UA 7-10  <3 WBC/hpf   Urine-Other TRICHOMONAS PRESENT     Recent Results (from the past 240 hour(s))  SURGICAL PCR SCREEN     Status: None   Collection Time    06/09/13 12:44 PM      Result Value Range Status   MRSA, PCR NEGATIVE  NEGATIVE Final   Staphylococcus aureus NEGATIVE  NEGATIVE Final   Comment:            The Xpert SA Assay (FDA     approved for NASAL specimens     in patients over 32 years of age),     is one component of     a comprehensive surveillance     program.  Test performance has     been validated by [employer] for patients greater     than or equal to 47 year old.     It is not intended     to diagnose infection nor to     guide or monitor treatment.   Creatinine:  Recent Labs  06/09/13 1204  CREATININE 1.62*    Assessment/plan: 1- urinary retention - discussed with patient nature risk and benefits of CIC or indwelling Foley. She elects to proceed with Foley catheter and I  will see her back in the office for exam, cystoscopy, Foley removal/void trial in the next 2 weeks. A Foley catheter will allow her bladder to rest and she should be able to avoid a baseline once her ambulation improves and she recovers further from her surgery. I will add bethanechol to the tamsulosin. Foley was just placed by nurse had already drained 800 cc of clear urine.  2 - Trichomonas -  Flagyl 2 g x1 3- chronic renal insufficiency - creatinine a baseline, good urine output  I gave the patient my card and contact information for followup and placed this in epic.  Fredricka Bonine 06/13/2013, 6:26 PM

## 2013-06-14 ENCOUNTER — Other Ambulatory Visit (HOSPITAL_COMMUNITY): Payer: Self-pay

## 2013-06-14 MED ORDER — BETHANECHOL CHLORIDE 25 MG PO TABS: 25.0000 mg | ORAL_TABLET | Freq: Three times a day (TID) | ORAL | Status: DC

## 2013-06-14 MED ORDER — OXYCODONE-ACETAMINOPHEN 5-325 MG PO TABS: 1.0000 | ORAL_TABLET | ORAL | Status: DC | PRN

## 2013-06-14 MED ORDER — CYCLOBENZAPRINE HCL 10 MG PO TABS: 10.0000 mg | ORAL_TABLET | Freq: Three times a day (TID) | ORAL | Status: DC | PRN

## 2013-06-14 MED ORDER — TAMSULOSIN HCL 0.4 MG PO CAPS: 0.4000 mg | ORAL_CAPSULE | Freq: Every day | ORAL | Status: DC

## 2013-06-14 NOTE — Care Management Note (Signed)
    Page 1 of 1   06/14/2013     3:52:40 PM   CARE MANAGEMENT NOTE 06/14/2013  Patient:  Colleen, Nunez   Account Number:  [id number]  Date Initiated:  06/13/2013  Documentation initiated by:  Olga Coaster  Subjective/Objective Assessment:   ADMITTED FOR SURGERY - C4 and  C5 Corpectomy and  C6-7 Anterior cervical discectomy and  Fusion C3-4, 4-5, 5-6, 6-7,     Action/Plan:   LIVES WITH FAMILY MEMBERS; CM FOLLOWING FOR DCP;   Anticipated DC Date:  06/17/2013   Anticipated DC Plan:  [place]  CM consult      Choice offered to / List presented to:             Status of service:  Completed, signed off Medicare Important Message given?   (If response is "NO", the following Medicare IM given date fields will be blank) Date Medicare IM given:   Date Additional Medicare IM given:    Discharge Disposition:  HOME/SELF CARE  Per UR Regulation:  Reviewed for med. necessity/level of care/duration of stay  If discussed at [place] of Stay Meetings, dates discussed:    Comments:  06/14/13 [place], MSN, CM-  Met with patient to discuss discharge needs.  Pt is unable to recieve home health services due to her insurance, but is able to participate in outpatient PT/OT.  Spoke with PT, who states that outpatient would be appropriate.  Pt states that she will be able to get transportation to appointments.  CM spoke with Leafy Ro at [facility] and recieved an appointment for 06/21/13 at 1345.  Pt aware and information was entered into AVS.  Wheeled walker was ordered and [facility] DME was notified that patient will be discharging home today.   06/13/2013- B CHANDLER RN,BSN,MHA

## 2013-06-14 NOTE — Progress Notes (Signed)
Physical Therapy Treatment Patient Details Name: Colleen Nunez MRN: [id number] DOB: [birth year] Today's Date: 06/14/2013 Time: [id number] PT Time Calculation (min): 29 min  PT Assessment / Plan / Recommendation  History of Present Illness Pt admit with C4-5 corpectomy and C6-7 ACF.     PT Comments   Plan is for d/c home today. Reviewed safety with RW and need for supervision with ambulation. Patient able to practice stairs again and reports her son will be there to help her. Spoke with case management about d/c plans who reports her insurance will not provide HHPT. Patient is agreeable to trying OPPT which I think would be beneficial to work on stability and strengthening of lower extremities for improved safety with gait. Patient believes she can get a ride on [employer] transportation.   Follow Up Recommendations  Outpatient PT;Supervision/Assistance - 24 hour     Does the patient have the potential to tolerate intense rehabilitation     Barriers to Discharge        Equipment Recommendations  Rolling walker with 5" wheels    Recommendations for Other Services    Frequency Min 5X/week   Progress towards PT Goals Progress towards PT goals: Progressing toward goals  Plan Discharge plan needs to be updated    Precautions / Restrictions Precautions Precautions: Fall;Cervical Precaution Comments: left knee instability Required Braces or Orthoses: Cervical Brace Cervical Brace: Hard collar;At all times   Pertinent Vitals/Pain Denies pain    Mobility  Bed Mobility Bed Mobility: Not assessed Supine to Sit: 6: Modified independent (Device/Increase time);HOB flat Sitting - Scoot to Edge of Bed: 6: Modified independent (Device/Increase time) Transfers Transfers: Sit to Stand;Stand to Sit Sit to Stand: 5: Supervision Stand to Sit: 5: Supervision Details for Transfer Assistance: has to get her weight over her feet with heavy bend at waist before she can stand up, verbal reminders for safe  hand placement Ambulation/Gait Ambulation/Gait Assistance: 5: Supervision Assistive device: Rolling walker Ambulation/Gait Assistance Details: slow steady gait with significant recurvatum on the left in addition to genu valgus, utilizes hyperextension at her knee to stabilize during stance phase, able to stabilize better using RW, needs verbal cues for safety especially when backing up to surfaces  Gait Pattern: Decreased hip/knee flexion - left;Left genu recurvatum;Trunk flexed Gait velocity: decreased Stairs Assistance: 5: Supervision Stairs Assistance Details (indicate cue type and reason): pthas to lock out her left knee and flex significantly at the waist while she pulls up with her arms on the rail to ascend the step, does this without LOB however needs gaurding for safety and stability Stair Management Technique: One rail Right;Step to pattern;Sideways Number of Stairs: 2 Wheelchair Mobility Wheelchair Mobility: No      PT Goals (current goals can now be found in the care plan section) Acute Rehab PT Goals Patient Stated Goal: to go home  Visit Information  Last PT Received On: 06/14/13 Assistance Needed: +1 History of Present Illness: Pt admit with C4-5 corpectomy and C6-7 ACF.      Subjective Data  Patient Stated Goal: to go home   Cognition  Cognition Arousal/Alertness: Awake/alert Behavior During Therapy: WFL for tasks assessed/performed Overall Cognitive Status: Within Functional Limits for tasks assessed    Balance     End of Session PT - End of Session Equipment Utilized During Treatment: Gait belt;Cervical collar Activity Tolerance: Patient tolerated treatment well Patient left: in chair;with call bell/phone within reach Nurse Communication: Mobility status (spoke with case manager about d/c plans and RW)  GP     [place] 06/14/2013, 1:33 PM

## 2013-06-14 NOTE — Discharge Summary (Signed)
Physician Discharge Summary  Patient ID: Colleen Nunez MRN: [id number] DOB/AGE: 1956-07-04 57 y.o.  Admit date: 06/09/2013 Discharge date: 06/14/2013  Admission Diagnoses:Cervical stenosis, Cervical spondylosis, OPLL , myelopathy   Discharge Diagnoses: Cervical stenosis, Cervical spondylosis, OPLL , myelopathy ,  Urinary retention  Active Problems:   * No active hospital problems. *   Discharged Condition: good  Hospital Course: pt admitted on day of surgery  - underwent procedure below  - pt did well  - up ambulating and using all 4 extremities at least as well as pre-op if not sl improved  - pt with urinary retention  - will go home with foley  -  And F/U with urology  Consults: urology -Fredricka Bonine, MD  Treatments: surgery: PROCEDURE: Procedure(s):  C4 and C5 Corpectomy and C6-7 Anterior cervical discectomy and Fusion C3-4, 4-5, 5-6, 6-7, /Peek cage C3-4, 4-5, 5-6 (C3-6) , structural allograft C6-7 , ttrestle plate C3-7 , microdisection with microscope   Discharge Exam: Blood pressure 129/72, pulse 90, temperature 98.8 F (37.1 C), temperature source Oral, resp. rate 16, height 5\' 2"  (1.575 m), weight 86.5 kg (190 lb 11.2 oz), SpO2 100.00%. Wound:c/d/i  Disposition: home     Medication List    STOP taking these medications       ibuprofen 200 MG tablet  Commonly known as:  ADVIL,MOTRIN      TAKE these medications       amLODipine 5 MG tablet  Commonly known as:  NORVASC  Take 5 mg by mouth daily.     bethanechol 25 MG tablet  Commonly known as:  URECHOLINE  Take 1 tablet (25 mg total) by mouth 3 (three) times daily.     cyclobenzaprine 10 MG tablet  Commonly known as:  FLEXERIL  Take 1 tablet (10 mg total) by mouth 3 (three) times daily as needed for muscle spasms.     HYDROcodone-acetaminophen 7.5-325 MG per tablet  Commonly known as:  NORCO  Take 1-2 tablets by mouth every 4 (four) hours as needed for pain. *TO BE CRUSHED* For pain     HYDROcodone-acetaminophen 7.5-325 MG per tablet  Commonly known as:  NORCO  Take 1 tablet by mouth every 4 (four) hours as needed for pain.     HYDROcodone-acetaminophen 7.5-325 MG per tablet  Commonly known as:  NORCO  Take 1 tablet by mouth every 4 (four) hours as needed for pain.     methocarbamol 500 MG tablet  Commonly known as:  ROBAXIN  Take 1 tablet (500 mg total) by mouth 3 (three) times daily.     oxyCODONE-acetaminophen 5-325 MG per tablet  Commonly known as:  PERCOCET/ROXICET  Take 1-2 tablets by mouth every 4 (four) hours as needed.     tamsulosin 0.4 MG Caps  Commonly known as:  FLOMAX  Take 1 capsule (0.4 mg total) by mouth daily.           Follow-up Information   Follow up with [name], MD In 2 weeks.   Contact information:   [address] [place] [address] [phone]       Signed: Otilio Connors, MD 06/14/2013, 9:08 AM

## 2013-06-14 NOTE — Progress Notes (Signed)
Occupational Therapy Treatment Patient Details Name: Colleen Nunez MRN: [id number] DOB: November 20, 1955 Today's Date: 06/14/2013 Time: [id number] OT Time Calculation (min): 27 min  OT Assessment / Plan / Recommendation  History of present illness Pt admit with C4-5 corpectomy and C6-7 ACF.     Clinical Impression Pt is s/p ACDF C4-7 surgery resulting in the deficits listed below (see OT problem list). Patient will benefit from skilled OT to increase their independence and safety with mobility (while adhering to their precautions) to allow discharge [facility]. (pt is appropriate for outpatient however uncertain if a ride is available and if can afford co pay)   OT comments    Follow Up Recommendations  Home health OT;Supervision/Assistance - 24 hour (needs (A) with foley total)    Barriers to Discharge       Equipment Recommendations  None recommended by OT    Recommendations for Other Services    Frequency Min 2X/week   Progress towards OT Goals Progress towards OT goals: Progressing toward goals  Plan Discharge plan remains appropriate    Precautions / Restrictions Precautions Precautions: Fall;Cervical Required Braces or Orthoses: Cervical Brace Cervical Brace: Hard collar;At all times   Pertinent Vitals/Pain No pain reported    ADL  Upper Body Dressing: Minimal assistance;Supervision/safety Where Assessed - Upper Body Dressing: Unsupported sitting Lower Body Dressing: Supervision/safety Where Assessed - Lower Body Dressing: Unsupported sit to stand Toilet Transfer: Supervision/safety Toilet Transfer Method: Sit to stand Toilet Transfer Equipment: Raised toilet seat with arms (or 3-in-1 over toilet) Equipment Used: Rolling walker ADL Comments: Pt supine on arrival and aware of pending d/c today. Pt completed bed mobility to prepare dressing. pt don underwear and pants with foley. Pt educated on empty foley and dress around foley. Pt educated on change of pads in aspen and care  of aspen. Pt 's RN made aware that additional pads need to be ordered from ortho tech prior to d/c. pt required extended time but able to self dress. Pt will need (A) of son  at home to empty foley. Pt does not have fine motor to push foley clamp. Pt provided urinal to empty foley. Pt provided home exercise program x4 exercises. The handout is progressive which will allow patient to continue therapy at home if  exercises become easier. Pt with all items placed in personal gym bag. Pt with no further questions or concerns at this time.    OT Diagnosis:    OT Problem List:   OT Treatment Interventions:     OT Goals(current goals can now be found in the care plan section) Acute Rehab OT Goals Patient Stated Goal: to go home OT Goal Formulation: With patient Time For Goal Achievement: 06/18/13 Potential to Achieve Goals: Good ADL Goals Pt Will Perform Lower Body Bathing: with supervision;sit to/from stand Pt Will Perform Lower Body Dressing: with supervision;with adaptive equipment;sit to/from stand Pt Will Transfer to Toilet: with supervision;ambulating;regular height toilet;bedside commode Pt Will Perform Toileting - Clothing Manipulation and hygiene: with supervision;sit to/from stand Pt Will Perform Tub/Shower Transfer: Tub transfer;with min guard assist;tub bench;rolling walker;ambulating  Visit Information  Last OT Received On: 06/14/13 Assistance Needed: +1 History of Present Illness: Pt admit with C4-5 corpectomy and C6-7 ACF.      Subjective Data      Prior Functioning       Cognition  Cognition Arousal/Alertness: Awake/alert Behavior During Therapy: WFL for tasks assessed/performed Overall Cognitive Status: Within Functional Limits for tasks assessed    Mobility  Bed Mobility  Supine to Sit: 6: Modified independent (Device/Increase time);HOB flat Sitting - Scoot to Edge of Bed: 6: Modified independent (Device/Increase time) Transfers Sit to Stand: 6: Modified independent  (Device/Increase time);With upper extremity assist;From bed Stand to Sit: 6: Modified independent (Device/Increase time);With upper extremity assist;To chair/3-in-1    Exercises      Balance     End of Session OT - End of Session Activity Tolerance: Patient tolerated treatment well Patient left: in chair;with call bell/phone within reach Nurse Communication: Mobility status;Precautions  GO     Veneda Melter 06/14/2013, 10:52 AM Pager: [phone]

## 2013-06-14 NOTE — Progress Notes (Signed)
Reviewed care of Foley cathater with patient, emptying bag good cleansing of peri area,  Keeping bag below bladder level, pt stated she understood, instructions, given discharge instructions and Rx, and reviewed need to call urologist to set up appointment.    [place]

## 2013-06-16 ENCOUNTER — Other Ambulatory Visit (HOSPITAL_COMMUNITY): Payer: Self-pay

## 2013-06-21 ENCOUNTER — Inpatient Hospital Stay (HOSPITAL_COMMUNITY): Admit: 2013-06-21 | Payer: Medicaid Other | Admitting: Physical Therapy

## 2013-06-21 ENCOUNTER — Encounter (HOSPITAL_COMMUNITY): Admission: RE | Payer: Self-pay | Source: Ambulatory Visit

## 2013-06-21 ENCOUNTER — Ambulatory Visit (HOSPITAL_COMMUNITY): Admission: RE | Admit: 2013-06-21 | Payer: Medicaid Other | Source: Ambulatory Visit | Admitting: Orthopaedic Surgery

## 2013-06-21 SURGERY — ARTHROPLASTY, KNEE, TOTAL
Anesthesia: Choice | Laterality: Left

## 2013-08-03 ENCOUNTER — Encounter (INDEPENDENT_AMBULATORY_CARE_PROVIDER_SITE_OTHER): Payer: Medicaid Other | Admitting: *Deleted

## 2013-08-03 DIAGNOSIS — M7989 Other specified soft tissue disorders: Secondary | ICD-10-CM

## 2013-08-03 DIAGNOSIS — M79609 Pain in unspecified limb: Secondary | ICD-10-CM

## 2013-08-07 ENCOUNTER — Encounter (HOSPITAL_COMMUNITY): Payer: Self-pay

## 2013-08-07 ENCOUNTER — Emergency Department (HOSPITAL_COMMUNITY): Payer: Medicaid Other

## 2013-08-07 ENCOUNTER — Inpatient Hospital Stay (HOSPITAL_COMMUNITY)
Admission: EM | Admit: 2013-08-07 | Discharge: 2013-08-22 | DRG: 029 | Disposition: A | Discharge status: Rehabilitation Facility | Payer: Medicaid Other | Attending: Internal Medicine | Admitting: Internal Medicine

## 2013-08-07 DIAGNOSIS — F411 Generalized anxiety disorder: Secondary | ICD-10-CM | POA: Diagnosis present

## 2013-08-07 DIAGNOSIS — F3289 Other specified depressive episodes: Secondary | ICD-10-CM | POA: Diagnosis present

## 2013-08-07 DIAGNOSIS — K5900 Constipation, unspecified: Secondary | ICD-10-CM | POA: Diagnosis present

## 2013-08-07 DIAGNOSIS — R079 Chest pain, unspecified: Secondary | ICD-10-CM

## 2013-08-07 DIAGNOSIS — K625 Hemorrhage of anus and rectum: Secondary | ICD-10-CM | POA: Diagnosis not present

## 2013-08-07 DIAGNOSIS — M79609 Pain in unspecified limb: Secondary | ICD-10-CM

## 2013-08-07 DIAGNOSIS — Z79899 Other long term (current) drug therapy: Secondary | ICD-10-CM

## 2013-08-07 DIAGNOSIS — D649 Anemia, unspecified: Secondary | ICD-10-CM | POA: Diagnosis present

## 2013-08-07 DIAGNOSIS — M109 Gout, unspecified: Secondary | ICD-10-CM | POA: Diagnosis present

## 2013-08-07 DIAGNOSIS — R1310 Dysphagia, unspecified: Secondary | ICD-10-CM

## 2013-08-07 DIAGNOSIS — R29898 Other symptoms and signs involving the musculoskeletal system: Secondary | ICD-10-CM | POA: Diagnosis present

## 2013-08-07 DIAGNOSIS — N269 Renal sclerosis, unspecified: Secondary | ICD-10-CM | POA: Diagnosis present

## 2013-08-07 DIAGNOSIS — N390 Urinary tract infection, site not specified: Secondary | ICD-10-CM

## 2013-08-07 DIAGNOSIS — I129 Hypertensive chronic kidney disease with stage 1 through stage 4 chronic kidney disease, or unspecified chronic kidney disease: Secondary | ICD-10-CM | POA: Diagnosis present

## 2013-08-07 DIAGNOSIS — N182 Chronic kidney disease, stage 2 (mild): Secondary | ICD-10-CM

## 2013-08-07 DIAGNOSIS — R112 Nausea with vomiting, unspecified: Secondary | ICD-10-CM

## 2013-08-07 DIAGNOSIS — G988 Other disorders of nervous system: Principal | ICD-10-CM | POA: Diagnosis present

## 2013-08-07 DIAGNOSIS — N184 Chronic kidney disease, stage 4 (severe): Secondary | ICD-10-CM

## 2013-08-07 DIAGNOSIS — E871 Hypo-osmolality and hyponatremia: Secondary | ICD-10-CM | POA: Diagnosis present

## 2013-08-07 DIAGNOSIS — E876 Hypokalemia: Secondary | ICD-10-CM | POA: Diagnosis present

## 2013-08-07 DIAGNOSIS — G959 Disease of spinal cord, unspecified: Secondary | ICD-10-CM

## 2013-08-07 DIAGNOSIS — B961 Klebsiella pneumoniae [K. pneumoniae] as the cause of diseases classified elsewhere: Secondary | ICD-10-CM | POA: Diagnosis present

## 2013-08-07 DIAGNOSIS — K222 Esophageal obstruction: Secondary | ICD-10-CM | POA: Diagnosis present

## 2013-08-07 DIAGNOSIS — F329 Major depressive disorder, single episode, unspecified: Secondary | ICD-10-CM | POA: Diagnosis present

## 2013-08-07 DIAGNOSIS — K219 Gastro-esophageal reflux disease without esophagitis: Secondary | ICD-10-CM | POA: Diagnosis present

## 2013-08-07 DIAGNOSIS — B1920 Unspecified viral hepatitis C without hepatic coma: Secondary | ICD-10-CM | POA: Diagnosis present

## 2013-08-07 DIAGNOSIS — Y831 Surgical operation with implant of artificial internal device as the cause of abnormal reaction of the patient, or of later complication, without mention of misadventure at the time of the procedure: Secondary | ICD-10-CM | POA: Diagnosis present

## 2013-08-07 DIAGNOSIS — N12 Tubulo-interstitial nephritis, not specified as acute or chronic: Secondary | ICD-10-CM | POA: Diagnosis present

## 2013-08-07 DIAGNOSIS — K047 Periapical abscess without sinus: Secondary | ICD-10-CM | POA: Diagnosis not present

## 2013-08-07 DIAGNOSIS — N179 Acute kidney failure, unspecified: Secondary | ICD-10-CM | POA: Diagnosis not present

## 2013-08-07 HISTORY — DX: Depression, unspecified: F32.A

## 2013-08-07 HISTORY — DX: Anxiety disorder, unspecified: F41.9

## 2013-08-07 HISTORY — DX: Major depressive disorder, single episode, unspecified: F32.9

## 2013-08-07 HISTORY — DX: Disorder of kidney and ureter, unspecified: N28.9

## 2013-08-07 HISTORY — DX: Gastro-esophageal reflux disease without esophagitis: K21.9

## 2013-08-07 HISTORY — DX: Other psychoactive substance abuse, uncomplicated: F19.10

## 2013-08-07 HISTORY — DX: Other specified abnormal immunological findings in serum: R76.8

## 2013-08-07 HISTORY — DX: Gout, unspecified: M10.9

## 2013-08-07 HISTORY — DX: Anemia, unspecified: D64.9

## 2013-08-07 LAB — CBC WITH DIFFERENTIAL/PLATELET
Basophils Absolute: 0 10*3/uL (ref 0.0–0.1)
Basophils Relative: 0 % (ref 0–1)
Hemoglobin: 8.9 g/dL — ABNORMAL LOW (ref 12.0–15.0)
Lymphocytes Relative: 25 % (ref 12–46)
MCHC: 32.2 g/dL (ref 30.0–36.0)
Monocytes Relative: 10 % (ref 3–12)
Neutro Abs: 7.2 10*3/uL (ref 1.7–7.7)
Neutrophils Relative %: 65 % (ref 43–77)
WBC: 11.1 10*3/uL — ABNORMAL HIGH (ref 4.0–10.5)

## 2013-08-07 LAB — COMPREHENSIVE METABOLIC PANEL
AST: 25 U/L (ref 0–37)
Albumin: 2.8 g/dL — ABNORMAL LOW (ref 3.5–5.2)
Alkaline Phosphatase: 51 U/L (ref 39–117)
BUN: 20 mg/dL (ref 6–23)
Chloride: 93 mEq/L — ABNORMAL LOW (ref 96–112)
Potassium: 3.9 mEq/L (ref 3.5–5.1)
Total Bilirubin: 0.6 mg/dL (ref 0.3–1.2)

## 2013-08-07 LAB — URINALYSIS, ROUTINE W REFLEX MICROSCOPIC
Bilirubin Urine: NEGATIVE
Glucose, UA: NEGATIVE mg/dL
Ketones, ur: NEGATIVE mg/dL
Protein, ur: 30 mg/dL — AB

## 2013-08-07 LAB — URINE MICROSCOPIC-ADD ON

## 2013-08-07 MED ORDER — ACETAMINOPHEN 650 MG RE SUPP: 650.0000 mg | Freq: Four times a day (QID) | RECTAL | Status: DC | PRN

## 2013-08-07 MED ORDER — INFLUENZA VAC SPLIT QUAD 0.5 ML IM SUSP
0.5000 mL | INTRAMUSCULAR | Status: AC
  Filled 2013-08-07: qty 0.5

## 2013-08-07 MED ORDER — TAMSULOSIN HCL 0.4 MG PO CAPS
0.4000 mg | ORAL_CAPSULE | Freq: Every day | ORAL | Status: DC
  Administered 2013-08-08 – 2013-08-22 (×14): 0.4 mg via ORAL
  Filled 2013-08-07 (×15): qty 1

## 2013-08-07 MED ORDER — SODIUM CHLORIDE 0.9 % IV BOLUS (SEPSIS)
1000.0000 mL | Freq: Once | INTRAVENOUS | Status: AC
  Administered 2013-08-07: 1000 mL via INTRAVENOUS

## 2013-08-07 MED ORDER — ONDANSETRON HCL 4 MG/2ML IJ SOLN
4.0000 mg | Freq: Once | INTRAMUSCULAR | Status: AC
  Administered 2013-08-07: 4 mg via INTRAVENOUS
  Filled 2013-08-07: qty 2

## 2013-08-07 MED ORDER — BETHANECHOL CHLORIDE 25 MG PO TABS
25.0000 mg | ORAL_TABLET | Freq: Three times a day (TID) | ORAL | Status: DC
  Administered 2013-08-08 – 2013-08-22 (×37): 25 mg via ORAL
  Filled 2013-08-07 (×54): qty 1

## 2013-08-07 MED ORDER — AMLODIPINE BESYLATE 10 MG PO TABS
10.0000 mg | ORAL_TABLET | Freq: Every day | ORAL | Status: DC
  Administered 2013-08-08 – 2013-08-12 (×5): 10 mg via ORAL
  Filled 2013-08-07: qty 2
  Filled 2013-08-07: qty 1
  Filled 2013-08-07 (×4): qty 2

## 2013-08-07 MED ORDER — DEXTROSE 5 % IV SOLN
1.0000 g | Freq: Once | INTRAVENOUS | Status: AC
  Administered 2013-08-07: 1 g via INTRAVENOUS
  Filled 2013-08-07: qty 10

## 2013-08-07 MED ORDER — SODIUM CHLORIDE 0.9 % IV SOLN
INTRAVENOUS | Status: DC
  Administered 2013-08-07 – 2013-08-13 (×8): via INTRAVENOUS
  Administered 2013-08-13: 1000 mL via INTRAVENOUS
  Administered 2013-08-14 – 2013-08-18 (×6): via INTRAVENOUS
  Administered 2013-08-19: 75 mL/h via INTRAVENOUS
  Administered 2013-08-20: 09:00:00 via INTRAVENOUS

## 2013-08-07 MED ORDER — ACETAMINOPHEN 325 MG PO TABS
650.0000 mg | ORAL_TABLET | Freq: Four times a day (QID) | ORAL | Status: DC | PRN
  Administered 2013-08-07 – 2013-08-11 (×3): 650 mg via ORAL
  Filled 2013-08-07 (×3): qty 2

## 2013-08-07 MED ORDER — ONDANSETRON HCL 4 MG PO TABS: 4.0000 mg | ORAL_TABLET | Freq: Four times a day (QID) | ORAL | Status: DC | PRN

## 2013-08-07 MED ORDER — OXYCODONE-ACETAMINOPHEN 5-325 MG PO TABS
1.0000 | ORAL_TABLET | ORAL | Status: DC | PRN
  Administered 2013-08-09 – 2013-08-19 (×4): 1 via ORAL
  Filled 2013-08-07 (×5): qty 1

## 2013-08-07 MED ORDER — ONDANSETRON HCL 4 MG/2ML IJ SOLN
4.0000 mg | Freq: Four times a day (QID) | INTRAMUSCULAR | Status: DC | PRN
  Administered 2013-08-14 – 2013-08-20 (×2): 4 mg via INTRAVENOUS
  Filled 2013-08-07 (×2): qty 2

## 2013-08-07 MED ORDER — DEXTROSE 5 % IV SOLN
1.0000 g | INTRAVENOUS | Status: DC
  Administered 2013-08-08 – 2013-08-16 (×9): 1 g via INTRAVENOUS
  Filled 2013-08-07 (×10): qty 10

## 2013-08-07 NOTE — H&P (Signed)
History and Physical  Colleen Nunez [id number] DOB: 01/09/1956 DOA: 08/07/2013  Referring physician: Dr. Roderic Palau in ED PCP: Rosita Fire, MD   Chief Complaint: Nausea  HPI:  57 year old woman presented emergency department with several complaints: Ongoing emesis for 2 months, generalized lower extremity weakness. Initial evaluation suggested UTI the patient was referred for further evaluation.  History obtained from patient, vague historian making data gathering difficult. Patient status post significant cervical surgery with discectomy and fusion at multiple levels 05/2013. Procedure performed for cervical stenosis, spondylosis, myelopathy and urinary retention. Since that time she has had generalized weakness especially lower extremity weakness. She was just seen by her neurosurgeon within the last week. Fluid collection over her anterior neck noted. Told by her surgeon that this may need to be drained in one month. Plan was to see her in one month.  The patient reports that she has had daily vomiting ever since surgery now for 2 months. She denies solid food intake and cannot tolerate liquids. Again she reports this is daily for 2 months now. She complains of lower extremity weakness. She has been using a walker. She lives alone and has been managing although it has been quite difficult. When asked several times about the chronicity of her weakness and symptoms her history remains very vague but she reports that this has been present for 2 months now without any new focal neurologic/muscular deficit or weakness.  She reports some fever and chills at home perhaps 42 weeks.  In the emergency department temperature 100. Heart rate 110-120s. Vitals otherwise stable. Laboratory studies notable for mild hyponatremia 131 with low chloride suggesting dehydration. CBC mild leukocytosis otherwise stable. Urinalysis consistent with UTI. Chest x-ray negative for acute disease. Soft tissue fullness over  the next seen.  Review of Systems:  Positive for chills and subjective fever, chronic leg weakness, chronic weakness, chest pain, SOB, dysuria, bleeding, abdominal pain  Past Medical History  Diagnosis Date  . Hypertension   . Arthritis   . Hepatitis     hep c    Past Surgical History  Procedure Laterality Date  . Knee arthroscopy    . Abdominal hysterectomy    . Cartilage removal      left knee  . Anterior cervical corpectomy N/A 06/09/2013    Procedure: C4 C5 Corpectomy with C6-7 Anterior cervical fusion/Peek cage/Trestle plate;  Surgeon: Otilio Connors, MD;  Location: [facility];  Service: Neurosurgery;  Laterality: N/A;  Cervical Four, Cervical Five Corpectomy and Cervical six-seven Anterior cervical fusion/Peek cage Three-Five /Trestle Plating Cervical Three to Cervical Seven  . X-stop implantation      Social History:  reports that she has never smoked. She does not have any smokeless tobacco history on file. She reports that she drinks about 1.8 ounces of alcohol per week. She reports that she does not use illicit drugs.  Allergies  Allergen Reactions  . Aspirin     REACTION: Hx of overdose on this years ago - too scared to take. Tried to committ suicide.    Family History  Problem Relation Age of Onset  . Diabetes Sister      Prior to Admission medications   Medication Sig Start Date End Date Taking? Authorizing Provider  amLODipine (NORVASC) 10 MG tablet Take 10 mg by mouth daily.   Yes Historical Provider, MD  bethanechol (URECHOLINE) 25 MG tablet Take 1 tablet (25 mg total) by mouth 3 (three) times daily. 06/14/13  Yes Otilio Connors, MD  oxyCODONE-acetaminophen (  PERCOCET/ROXICET) 5-325 MG per tablet Take 1 tablet by mouth every 4 (four) hours as needed for pain.   Yes Historical Provider, MD  tamsulosin (FLOMAX) 0.4 MG CAPS Take 1 capsule (0.4 mg total) by mouth daily. 06/14/13  Yes Otilio Connors, MD   Physical Exam: Filed Vitals:   08/07/13 1257 08/07/13  1611  BP: 154/92 152/96  Pulse: 122 111  Temp: 99.6 F (37.6 C) 100 F (37.8 C)  TempSrc: Oral Oral  Resp: 18 20  SpO2: 98% 98%   General: Examined in the emergency department. Appears calm and comfortable Eyes: PERRL, normal lids, irises  ENT: grossly normal hearing, lips & tongue Neck: no LAD, there is soft tissue swelling overlying the well-healed cervical incision anteriorly. Very soft, nontender, mobile, no warmth. Cardiovascular: RRR, no m/r/g. No LE edema. Respiratory: CTA bilaterally, no w/r/r. Normal respiratory effort. Abdomen: soft, ntnd Skin: no rash or induration seen on limited exam Musculoskeletal: Strength 4/5 all extremities, symmetric. Some changes bilateral hands seen consistent with history of cervical myelopathy. Psychiatric: grossly normal mood and affect, speech fluent and appropriate Neurologic: As above  Wt Readings from Last 3 Encounters:  06/10/13 86.5 kg (190 lb 11.2 oz)  06/10/13 86.5 kg (190 lb 11.2 oz)  06/03/13 82.555 kg (182 lb)    Labs on Admission:  Basic Metabolic Panel:  Recent Labs Lab 08/07/13 1402  NA 131*  K 3.9  CL 93*  CO2 27  GLUCOSE 127*  BUN 20  CREATININE 1.95*  CALCIUM 9.8    Liver Function Tests:  Recent Labs Lab 08/07/13 1402  AST 25  ALT 8  ALKPHOS 51  BILITOT 0.6  PROT 8.4*  ALBUMIN 2.8*   CBC:  Recent Labs Lab 08/07/13 1402  WBC 11.1*  NEUTROABS 7.2  HGB 8.9*  HCT 27.6*  MCV 81.9  PLT 336    Radiological Exams on Admission: Dg Chest Port 1 View  08/07/2013   CLINICAL DATA:  Nausea  EXAM: PORTABLE CHEST - 1 VIEW  COMPARISON:  June 09, 2013  FINDINGS: There is soft tissue fullness in the paratracheal regions bilaterally which was not present on prior study. Elsewhere, lungs are clear. Heart size and pulmonary vascularity are normal. There is no adenopathy beyond potential adenopathy in the peritracheal regions. No pneumothorax. There is arthropathy in both shoulders. There is postoperative change  in the cervical spine inferiorly.  IMPRESSION: Soft tissue fullness in the paratracheal regions bilaterally. Etiology for this soft tissue fullness is uncertain. Given this finding, upright PA and lateral chest radiographs advised to further evaluate. If soft tissue opacity persists in these regions of the following upright PA and lateral chest imaging, contrast enhanced chest CT would be advised to further assess.  Elsewhere lungs appear clear.   Electronically Signed   By: Lowella Grip   On: 08/07/2013 14:39    Principal Problem:   UTI (lower urinary tract infection) Active Problems:   ANEMIA, NORMOCYTIC   Nausea & vomiting   Chronic kidney disease, stage IV (severe)   Cervical myelopathy   Assessment/Plan 1. UTI: Empiric antibiotics. Followup culture. 2. Soft tissue fullness of the neck: Suspect seroma based on location, nontender, no warmth. No signs or symptoms to suggest infection or complicating feature. Followup with neur, suspect dehydration osurgery as an outpatient. 3. Chronic nausea, possible vomiting: Long-standing per patient. Antiemetics. Doubt acute issue. 4. Chronic upper and lower extremity weakness secondary to cervical myelopathy, chronic/long-standing, present since surgery 05/2013 5. Chronic kidney disease stage IV: Appears  stable. 6. Hyponatremia 7. Chronic normocytic anemia: Appears stable.  Code Status: full code  DVT prophylaxis: SCDs Family Communication: none present Disposition Plan/Anticipated LOS: admit to Dr. Josephine Cables service. 2 days.  Time spent: 55 minutes  Murray Hodgkins, MD  [facility] Pager [phone] 08/07/2013, 5:07 PM

## 2013-08-07 NOTE — ED Provider Notes (Signed)
CSN: [id number]     Arrival date & time 08/07/13  1251 History   This chart was scribed for Maudry Diego, MD, by Neta Ehlers, ED Scribe. This patient was seen in room APA18/APA18 and the patient's care was started at 1:52 PM. First MD Initiated Contact with Patient 08/07/13 1346     Chief Complaint  Patient presents with  . Leg Pain  . Nausea    Patient is a 57 y.o. female presenting with vomiting. The history is provided by the patient. No language interpreter was used.  Emesis Severity:  Moderate Duration:  2 weeks Timing:  Intermittent Quality:  Undigested food Able to tolerate:  Liquids Progression:  Unchanged Chronicity:  New Relieved by:  Nothing Worsened by:  Nothing tried Ineffective treatments:  None tried Associated symptoms: no abdominal pain, no arthralgias, no diarrhea and no headaches   Risk factors: no alcohol use    HPI Comments: Colleen Nunez is a 58 y.o. female, with a h/o Hepatitis C and HTN, who presents to the Emergency Department complaining of two weeks of intermittent episodes of emesis along with associated nausea. She states that she is unable to tolerate any food or medication. The pt had surgery to correct spinal stenosis two weeks ago, and she has experienced pain to her feet and ankles bilaterally since the surgery; she reports the pain to her feet inhibit ambulation. She had a post-op check-up four days ago.   Past Medical History  Diagnosis Date  . Hypertension   . Arthritis   . Hepatitis     hep c   Past Surgical History  Procedure Laterality Date  . Knee arthroscopy    . Abdominal hysterectomy    . Cartilage removal      left knee  . Anterior cervical corpectomy N/A 06/09/2013    Procedure: C4 C5 Corpectomy with C6-7 Anterior cervical fusion/Peek cage/Trestle plate;  Surgeon: Otilio Connors, MD;  Location: [facility];  Service: Neurosurgery;  Laterality: N/A;  Cervical Four, Cervical Five Corpectomy and Cervical six-seven Anterior  cervical fusion/Peek cage Three-Five /Trestle Plating Cervical Three to Cervical Seven  . X-stop implantation     No family history on file. History  Substance Use Topics  . Smoking status: Never Smoker   . Smokeless tobacco: Not on file  . Alcohol Use: 1.8 oz/week    3 Cans of beer per week     Comment: weekends   No OB history provided.  Review of Systems  Constitutional: Negative for appetite change and fatigue.  HENT: Negative for congestion, sinus pressure and ear discharge.   Eyes: Negative for discharge.  Respiratory: Negative for cough.   Cardiovascular: Negative for chest pain.  Gastrointestinal: Positive for vomiting. Negative for abdominal pain and diarrhea.  Genitourinary: Negative for frequency and hematuria.  Musculoskeletal: Negative for back pain and arthralgias.  Skin: Negative for rash.  Neurological: Negative for seizures and headaches.  Psychiatric/Behavioral: Negative for hallucinations.    Allergies  Aspirin  Home Medications   Current Outpatient Rx  Name  Route  Sig  Dispense  Refill  . amLODipine (NORVASC) 10 MG tablet   Oral   Take 10 mg by mouth daily.         . bethanechol (URECHOLINE) 25 MG tablet   Oral   Take 1 tablet (25 mg total) by mouth 3 (three) times daily.   90 tablet   1   . oxyCODONE-acetaminophen (PERCOCET/ROXICET) 5-325 MG per tablet   Oral  Take 1 tablet by mouth every 4 (four) hours as needed for pain.         . tamsulosin (FLOMAX) 0.4 MG CAPS   Oral   Take 1 capsule (0.4 mg total) by mouth daily.   15 capsule   0    Triage Vitals: BP 154/92  Pulse 122  Temp(Src) 99.6 F (37.6 C) (Oral)  Resp 18  SpO2 98%  Physical Exam  Constitutional: She is oriented to person, place, and time. She appears well-developed.  HENT:  Head: Normocephalic.  Dry Mucus Membranes.   Eyes: Conjunctivae and EOM are normal. No scleral icterus.  Neck: Neck supple. No thyromegaly present.  Growth anterior lateral neck which is 3  cm in diameter.   Cardiovascular: Normal rate and regular rhythm.  Exam reveals no gallop and no friction rub.   No murmur heard. Pulmonary/Chest: No stridor. She has no wheezes. She has no rales. She exhibits no tenderness.  Abdominal: She exhibits no distension. There is no tenderness. There is no rebound.  Musculoskeletal: Normal range of motion. She exhibits no edema.  Bottom of feet very dry, scaly, and tender.   Lymphadenopathy:    She has no cervical adenopathy.  Neurological: She is oriented to person, place, and time. Coordination normal.  Skin: No rash noted. No erythema.  Psychiatric: She has a normal mood and affect. Her behavior is normal.    ED Course  Procedures (including critical care time)  DIAGNOSTIC STUDIES: Oxygen Saturation is 98% on room air, normal by my interpretation.    COORDINATION OF CARE:  1:57 PM- Discussed treatment plan with patient, and the patient agreed to the plan.   Labs Review Labs Reviewed  CBC WITH DIFFERENTIAL - Abnormal; Notable for the following:    WBC 11.1 (*)    RBC 3.37 (*)    Hemoglobin 8.9 (*)    HCT 27.6 (*)    Monocytes Absolute 1.1 (*)    All other components within normal limits  COMPREHENSIVE METABOLIC PANEL - Abnormal; Notable for the following:    Sodium 131 (*)    Chloride 93 (*)    Glucose, Bld 127 (*)    Creatinine, Ser 1.95 (*)    Total Protein 8.4 (*)    Albumin 2.8 (*)    GFR calc non Af Amer 27 (*)    GFR calc Af Amer 32 (*)    All other components within normal limits  URINALYSIS, ROUTINE W REFLEX MICROSCOPIC - Abnormal; Notable for the following:    APPearance CLOUDY (*)    Hgb urine dipstick SMALL (*)    Protein, ur 30 (*)    Urobilinogen, UA 4.0 (*)    Leukocytes, UA LARGE (*)    All other components within normal limits  URINE MICROSCOPIC-ADD ON - Abnormal; Notable for the following:    Squamous Epithelial / LPF FEW (*)    Bacteria, UA MANY (*)    All other components within normal limits  URINE  CULTURE   Imaging Review Dg Chest Port 1 View  08/07/2013   CLINICAL DATA:  Nausea  EXAM: PORTABLE CHEST - 1 VIEW  COMPARISON:  June 09, 2013  FINDINGS: There is soft tissue fullness in the paratracheal regions bilaterally which was not present on prior study. Elsewhere, lungs are clear. Heart size and pulmonary vascularity are normal. There is no adenopathy beyond potential adenopathy in the peritracheal regions. No pneumothorax. There is arthropathy in both shoulders. There is postoperative change in the cervical spine  inferiorly.  IMPRESSION: Soft tissue fullness in the paratracheal regions bilaterally. Etiology for this soft tissue fullness is uncertain. Given this finding, upright PA and lateral chest radiographs advised to further evaluate. If soft tissue opacity persists in these regions of the following upright PA and lateral chest imaging, contrast enhanced chest CT would be advised to further assess.  Elsewhere lungs appear clear.   Electronically Signed   By: Lowella Grip   On: 08/07/2013 14:39    MDM  No diagnosis found.   The chart was scribed for me under my direct supervision.  I personally performed the history, physical, and medical decision making and all procedures in the evaluation of this patient.Maudry Diego, MD 08/07/13 [phone]

## 2013-08-07 NOTE — ED Notes (Signed)
No emesis noted at this time. Pt states feels better at this time.

## 2013-08-07 NOTE — ED Notes (Signed)
EMS reports pt had surgery for spinal stenosis 2 months ago.  Pt has swelling to throat.  C/O nausea x 1 week. Pt says is spitting up and vomiting.  Saw pcp this week for bilateral feet and ankle pain.  Pt denies any injury, says has had pain since having the surgery.

## 2013-08-08 ENCOUNTER — Inpatient Hospital Stay (HOSPITAL_COMMUNITY): Payer: Medicaid Other

## 2013-08-08 LAB — CBC
Hemoglobin: 8.4 g/dL — ABNORMAL LOW (ref 12.0–15.0)
MCH: 26.2 pg (ref 26.0–34.0)
MCHC: 31.7 g/dL (ref 30.0–36.0)
Platelets: 300 10*3/uL (ref 150–400)
RDW: 13.8 % (ref 11.5–15.5)
WBC: 10.1 10*3/uL (ref 4.0–10.5)

## 2013-08-08 LAB — BASIC METABOLIC PANEL
BUN: 21 mg/dL (ref 6–23)
Calcium: 9.8 mg/dL (ref 8.4–10.5)
Creatinine, Ser: 2.22 mg/dL — ABNORMAL HIGH (ref 0.50–1.10)
GFR calc Af Amer: 27 mL/min — ABNORMAL LOW (ref >90)
GFR calc non Af Amer: 23 mL/min — ABNORMAL LOW (ref >90)
Potassium: 4.1 mEq/L (ref 3.5–5.1)

## 2013-08-08 LAB — URIC ACID: Uric Acid, Serum: 8.4 mg/dL — ABNORMAL HIGH (ref 2.4–7.0)

## 2013-08-08 NOTE — Progress Notes (Signed)
43 - Dr. Legrand Rams addressed xray of pt's right foot/ankle and ordering uric acid levels.  Order not entered.  RN called Dr. Legrand Rams and asked him about this.  Dr. Legrand Rams gave order for patient to have uric acid levels checked and xray of patient's right foot/ankle.  Orders followed.

## 2013-08-08 NOTE — Progress Notes (Signed)
UR Chart Review Completed  

## 2013-08-08 NOTE — Progress Notes (Signed)
Xray came to pick up patient for xray of right foot/ankle.  Patient reported that her left foot/ankle was also hurting.  Dr. Willey Blade called and notified.  Gave order for patient to have xray of left foot/ankle.  Orders followed.

## 2013-08-08 NOTE — Care Management Note (Signed)
    Page 1 of 2   08/12/2013     11:25:49 AM   CARE MANAGEMENT NOTE 08/12/2013  Patient:  Colleen Nunez, Colleen Nunez   Account Number:  [id number]  Date Initiated:  08/08/2013  Documentation initiated by:  Claretha Cooper  Subjective/Objective Assessment:   CM spoke to pt with son at bedside. Son is living with pt at this time. Pt wishes to go home with home health. Son is encouraging pt to consider short term rehab. CSW spoke with pt who confirmed her wishes to return to her home when [facility]     Action/Plan:   Anticipated DC Date:  08/12/2013   Anticipated DC Plan:  [place]  In-house referral  Clinical Social Worker      DC Planning Services  CM consult      [facility] Choice  HOME HEALTH   Choice offered to / List presented to:  C-1 Patient        [place] arranged  HH-1 RN  [place].   Status of service:  Completed, signed off Medicare Important Message given?   (If response is "NO", the following Medicare IM given date fields will be blank) Date Medicare IM given:   Date Additional Medicare IM given:    Discharge Disposition:  ACUTE TO ACUTE TRANS  Per UR Regulation:    If discussed at Long Length of Stay Meetings, dates discussed:    Comments:  08/12/13 Claretha Cooper RN BSN CM  08/08/13 Kaid Seeberger Dellia Nims RN BSN CM

## 2013-08-08 NOTE — Evaluation (Signed)
Physical Therapy Evaluation Patient Details Name: Colleen Nunez MRN: [id number] DOB: 1956/04/02 Today's Date: 08/08/2013 Time: [id number] PT Time Calculation (min): 71 min  PT Assessment / Plan / Recommendation History of Present Illness   Pt is admitted with UTE.  She underwent anterior cervical surgery July 2014 for spinal stenosis/myelopathy and now c.o nausea/womiting, LE weakness and severe pain in both ankles/feet which impede her ability to ambulate.  She lives with her son who is available most of the time.  She has been using a cane of walker at home for gait.  She states that she is on disability  Clinical Impression   Pt was seen for evaluation.  She was alert and cooperative.  She did have generalized LE weakness, left weaker than right.  Although she c/o severe foot and ankle pain, there was no visible edema/erythema/warmth.  She has full active joint ROM.  She has what appears to be end stage DJD in the left knee with fairly severe genu valgus.  She was able to ambulate 30' with a walker, gait extremely slow and labored.  If pain can be controlled in her ankles/feet she should be able to improve her gait proficiency and manage at home.  If not, she would be appropriate for SNF.    PT Assessment  Patient needs continued PT services    Follow Up Recommendations  Home health PT;SNF (would be appropriate for either venue)    Does the patient have the potential to tolerate intense rehabilitation    no  Barriers to Discharge        Equipment Recommendations  None recommended by PT    Recommendations for Other Services     Frequency Min 3X/week    Precautions / Restrictions Precautions Precautions: Fall Restrictions Weight Bearing Restrictions: No   Pertinent Vitals/Pain       Mobility  Bed Mobility Bed Mobility: Supine to Sit Supine to Sit: 5: Supervision;HOB elevated Details for Bed Mobility Assistance: transfer slow and labored Transfers Transfers: Sit to  Stand;Stand to Sit Sit to Stand: 6: Modified independent (Device/Increase time);With upper extremity assist Stand to Sit: 6: Modified independent (Device/Increase time);With upper extremity assist Ambulation/Gait Ambulation/Gait Assistance: 5: Supervision Ambulation Distance (Feet): 30 Feet Assistive device: Rolling walker Ambulation/Gait Assistance Details: pt instructed in correct gait pattern to off load weight on LLE, also correct position of walker Gait Pattern: Antalgic Gait velocity: very slow and labored General Gait Details: antalgia LLE Stairs: No    Exercises     PT Diagnosis: Difficulty walking;Acute pain;Generalized weakness  PT Problem List: Decreased strength;Decreased activity tolerance;Decreased mobility;Pain PT Treatment Interventions: Gait training;Stair training;Therapeutic exercise     PT Goals(Current goals can be found in the care plan section) Acute Rehab PT Goals Patient Stated Goal: none stated PT Goal Formulation: With patient Time For Goal Achievement: 08/22/13 Potential to Achieve Goals: Good  Visit Information  Last PT Received On: 08/08/13       Prior [place] expects to be discharged to:: Private residence Living Arrangements: Children Available Help at Discharge: Family;Available 24 hours/day Type of Home: House Home Access: Stairs to enter [employer] of Steps: 3 Entrance Stairs-Rails: Right Home Layout: One level Home Equipment: Walker - 2 wheels;Cane - single point;Bedside commode Prior Function Level of Independence: Independent with assistive device(s) Communication Communication: No difficulties    Cognition  Cognition Arousal/Alertness: Awake/alert Behavior During Therapy: WFL for tasks assessed/performed Overall Cognitive Status: Within Functional Limits for tasks assessed  Extremity/Trunk Assessment Lower Extremity Assessment Lower Extremity Assessment: Generalized weakness  (LLE slightly weaker than RLE) Cervical / Trunk Assessment Cervical / Trunk Assessment: Normal   Balance Balance Balance Assessed:  (WNL by functional observation)  End of Session PT - End of Session Equipment Utilized During Treatment: Gait belt Activity Tolerance: Patient limited by fatigue Patient left: in chair;with call bell/phone within reach Nurse Communication: Mobility status  GP     Sable Feil 08/08/2013, 10:52 AM

## 2013-08-08 NOTE — Progress Notes (Signed)
Subjective: Patient was admitted yesterday due to UTI. She is complaining of rt ankle and rt foot pain. Claims she couldn't walk due to the pain.   Objective: Vital signs in last 24 hours: Temp:  [97.4 F (36.3 C)-101 F (38.3 C)] 98.5 F (36.9 C) (09/22 0300) Pulse Rate:  [92-125] 92 (09/22 0300) Resp:  [18-20] 20 (09/22 0300) BP: (112-154)/(75-96) 112/85 mmHg (09/22 0300) SpO2:  [98 %-100 %] 100 % (09/22 0300) Weight change:  Last BM Date:  (unknown, pt states 2 weeks ago)  Intake/Output from previous day: 09/21 0701 - 09/22 0700 In: 240 [P.O.:240] Out: 330 [Urine:330]  PHYSICAL EXAM General appearance: alert and no distress Resp: clear to auscultation bilaterally Cardio: S1, S2 normal GI: soft, non-tender; bowel sounds normal; no masses,  no organomegaly Extremities: tendeness of the rt ankle joint and rt foot  Lab Results:    @labtest @ [facility] No results found for this basename: PHART, PCO2, PO2ART, TCO2, HCO3,  in the last 72 hours CULTURES No results found for this or any previous visit (from the past 240 hour(s)). Studies/Results: Dg Chest Port 1 View  08/07/2013   CLINICAL DATA:  Nausea  EXAM: PORTABLE CHEST - 1 VIEW  COMPARISON:  June 09, 2013  FINDINGS: There is soft tissue fullness in the paratracheal regions bilaterally which was not present on prior study. Elsewhere, lungs are clear. Heart size and pulmonary vascularity are normal. There is no adenopathy beyond potential adenopathy in the peritracheal regions. No pneumothorax. There is arthropathy in both shoulders. There is postoperative change in the cervical spine inferiorly.  IMPRESSION: Soft tissue fullness in the paratracheal regions bilaterally. Etiology for this soft tissue fullness is uncertain. Given this finding, upright PA and lateral chest radiographs advised to further evaluate. If soft tissue opacity persists in these regions of the following upright PA and lateral chest imaging, contrast enhanced chest  CT would be advised to further assess.  Elsewhere lungs appear clear.   Electronically Signed   By: Lowella Grip   On: 08/07/2013 14:39    Medications: I have reviewed the patient's current medications.  Assesment: Principal Problem:   UTI (lower urinary tract infection) Active Problems:   ANEMIA, NORMOCYTIC   Nausea & vomiting   Chronic kidney disease, stage IV (severe)   Cervical myelopathy    Plan: Medications reviewed Will continue IV fluid and current Iv antibiotics pending sensitivity result Will monitor CBC/BMP Will do x-ray of the ankle joint and uric acid level    LOS: 1 day   Colleen Nunez 08/08/2013, 7:49 AM

## 2013-08-08 NOTE — Progress Notes (Signed)
INITIAL NUTRITION ASSESSMENT  DOCUMENTATION CODES Per approved criteria  -Obesity Unspecified   INTERVENTION:  Ensure Complete po BID, each supplement provides 350 kcal and 13 grams of protein.  Add MVI daily  Follow for diet advancement and nutrition care  NUTRITION DIAGNOSIS: Inadequate oral intake related to ongoing emesis as evidenced by pt diet hx.   Goal: Pt to meet >/= 90% of their estimated nutrition needs   Monitor:  Po intake, labs and wt trends  Reason for Assessment: Malnutrition Screen Score = 3  57 y.o. female  Admitting Dx: UTI (lower urinary tract infection)  ASSESSMENT: Pt has hx of cervical stenosis s/p cervical surgery. C/o emesis since procedure in July. Right foot pain today (elevated uric acid level). Stage IV kidney dz and UTI. Follows Regular diet at home. Pt reports unable to tolerate solid foods prior to admission however her wt is stable. Weight change may be masked by edema?    Patient Active Problem List   Diagnosis Date Noted  . UTI (lower urinary tract infection) 08/07/2013  . Nausea & vomiting 08/07/2013  . Chronic kidney disease, stage IV (severe) 08/07/2013  . Cervical myelopathy 08/07/2013  . GERD 06/12/2009  . CHEST PAIN 06/12/2009  . LEG PAIN, BILATERAL 05/28/2009  . OBESITY 12/17/2007  . DYSPHAGIA UNSPECIFIED 12/03/2007  . ANEMIA, NORMOCYTIC 07/13/2007  . ALCOHOL ABUSE 07/13/2007  . RENAL DISEASE, CHRONIC, STAGE II 07/13/2007  . ANXIETY 06/29/2007  . DEPRESSION 06/29/2007  . HYPERTENSION 06/29/2007  . CONSTIPATION 06/29/2007  . ARTHRITIS 06/29/2007  . MALAISE AND FATIGUE 06/29/2007    Nutrition Focused Physical Exam:  Subcutaneous Fat:  Orbital Region: well nourished Upper Arm Region: well nourished Thoracic and Lumbar Region: well nourished  Muscle:  Temple Region: mild  malnutrition Clavicle Bone Region: mild  Clavicle and Acromion Bone Region: well nourished Scapular Bone Region: not assessed Dorsal Hand: mild  wasting Patellar Region: edema per pt  Anterior Thigh Region: well nourished Posterior Calf Region: well nourished  Edema: edema per pt very painful to touch  Height: Ht Readings from Last 1 Encounters:  08/07/13 5\' 2"  (1.575 m)    Weight: Wt Readings from Last 1 Encounters:  06/10/13 190 lb 11.2 oz (86.5 kg)    Ideal Body Weight: 110# (50 kg)  % Ideal Body Weight: 174%  Wt Readings from Last 10 Encounters:  06/10/13 190 lb 11.2 oz (86.5 kg)  06/10/13 190 lb 11.2 oz (86.5 kg)  06/03/13 182 lb (82.555 kg)  11/24/12 190 lb (86.183 kg)  03/24/12 188 lb (85.276 kg)  06/12/09 199 lb (90.266 kg)  05/28/09 198 lb (89.812 kg)  11/27/08 202 lb (91.627 kg)  10/30/08 202 lb (91.627 kg)  10/16/08 205 lb (92.987 kg)    Usual Body Weight: 190-200#   % Usual Body Weight: 100%  BMI:  There is no weight on file to calculate BMI.  Estimated Nutritional Needs: Kcal: 1400-1600  Protein: 75-85 gr Fluid: >2000 ml/day  Skin: intact   Diet Order: Full Liquid po 75%  EDUCATION NEEDS: -No education needs identified at this time   Intake/Output Summary (Last 24 hours) at 08/08/13 1115 Last data filed at 08/08/13 0800  Gross per 24 hour  Intake    480 ml  Output    330 ml  Net    150 ml    Last BM:   Labs:   Recent Labs Lab 08/07/13 1402 08/08/13 0507  NA 131* 136  K 3.9 4.1  CL 93* 98  CO2  27 27  BUN 20 21  CREATININE 1.95* 2.22*  CALCIUM 9.8 9.8  GLUCOSE 127* 104*    CBG (last 3)  No results found for this basename: GLUCAP,  in the last 72 hours  Scheduled Meds: . amLODipine  10 mg Oral Daily  . bethanechol  25 mg Oral TID  . cefTRIAXone (ROCEPHIN)  IV  1 g Intravenous Q24H  . influenza vac split quadrivalent PF  0.5 mL Intramuscular Tomorrow-1000  . tamsulosin  0.4 mg Oral Daily    Continuous Infusions: . sodium chloride 75 mL/hr at 08/07/13 1843    Past Medical History  Diagnosis Date  . Hypertension   . Arthritis   . Hepatitis     hep c     Past Surgical History  Procedure Laterality Date  . Knee arthroscopy    . Abdominal hysterectomy    . Cartilage removal      left knee  . Anterior cervical corpectomy N/A 06/09/2013    Procedure: C4 C5 Corpectomy with C6-7 Anterior cervical fusion/Peek cage/Trestle plate;  Surgeon: Otilio Connors, MD;  Location: [place] [facility];  Service: Neurosurgery;  Laterality: N/A;  Cervical Four, Cervical Five Corpectomy and Cervical six-seven Anterior cervical fusion/Peek cage Three-Five /Trestle Plating Cervical Three to Cervical Seven  . X-stop implantation      Colman Cater MS,RD,LDN,CSG Office: [phone] Pager: [phone]

## 2013-08-08 NOTE — Clinical Social Work Note (Signed)
CSW met with pt as CM reports son was requesting call to discuss SNF. Pt alert and oriented. She does not want CSW to call her son as she plans to return home. She states that he is aware she does not want rehab. CSW asked that pt please notify him that she did not want CSW to call him. Son is also not listed on chart as contact. CSW will sign off but can be reconsulted if needed.  Colleen Nunez, [place]

## 2013-08-09 LAB — URINE CULTURE

## 2013-08-09 MED ORDER — COLCHICINE 0.6 MG PO TABS
1.2000 mg | ORAL_TABLET | Freq: Once | ORAL | Status: AC
  Administered 2013-08-09: 1.2 mg via ORAL
  Filled 2013-08-09: qty 2

## 2013-08-09 MED ORDER — COLCHICINE 0.6 MG PO TABS
0.6000 mg | ORAL_TABLET | Freq: Two times a day (BID) | ORAL | Status: DC
  Administered 2013-08-09 – 2013-08-15 (×11): 0.6 mg via ORAL
  Filled 2013-08-09 (×15): qty 1

## 2013-08-09 NOTE — Progress Notes (Signed)
Subjective: Patient continue to complain of abdominal pain and rt foot pain. Her uric acid level is elevated.  Objective: Vital signs in last 24 hours: Temp:  [98.9 F (37.2 C)-99.5 F (37.5 C)] 98.9 F (37.2 C) (09/23 0525) Pulse Rate:  [91-114] 91 (09/23 0525) Resp:  [19-20] 19 (09/23 0525) BP: (111-161)/(68-79) 111/68 mmHg (09/23 0525) SpO2:  [97 %-100 %] 99 % (09/23 0525) Weight change:  Last BM Date:  (unknown, is passing gas)  Intake/Output from previous day: 09/22 0701 - 09/23 0700 In: 480 [P.O.:480] Out: 1225 [Urine:1225]  PHYSICAL EXAM General appearance: alert and no distress Resp: clear to auscultation bilaterally Cardio: S1, S2 normal GI: soft, non-tender; bowel sounds normal; no masses,  no organomegaly Extremities: tendeness of the rt ankle joint and rt foot  Lab Results:    @labtest @ [facility] No results found for this basename: PHART, PCO2, PO2ART, TCO2, HCO3,  in the last 72 hours CULTURES Recent Results (from the past 240 hour(s))  URINE CULTURE     Status: None   Collection Time    08/07/13  2:10 PM      Result Value Range Status   Specimen Description URINE, CATHETERIZED   Final   Special Requests NONE   Final   Culture  Setup Time     Final   Value: 08/08/2013 00:11     Performed at [place]     Final   Value: >=100,000 COLONIES/ML     Performed at [facility]   Culture     Final   Value: [place]     Performed at [facility]   Report Status PENDING   Incomplete   Studies/Results: Dg Ankle Complete Left  08/08/2013   CLINICAL DATA:  Left ankle pain.  EXAM: LEFT ANKLE COMPLETE - 3+ VIEW  COMPARISON:  Plain films left ankle 08/08/2004.  FINDINGS: No acute bony or joint abnormality is identified. As on the prior study, there is degenerative change most notable about the talonavicular joint. Calcaneal spur is identified.  IMPRESSION: No acute finding.  Talonavicular degenerative change.   Calcaneal spur.   Electronically Signed   By: Inge Rise M.D.   On: 08/08/2013 21:48   Dg Ankle Complete Right  08/08/2013   CLINICAL DATA:  Right ankle and foot pain and swelling.  EXAM: RIGHT ANKLE - COMPLETE 3+ VIEW  COMPARISON:  None.  FINDINGS: No acute bony or joint abnormality is identified. There is degenerative change of the midfoot, worst at the talonavicular joint. Small linear calcification in the Achilles tendon is compatible chronic tendinosis. Tiny well corticated bony fragment off the lateral malleolus is compatible with old trauma.  IMPRESSION: No acute finding.  Degenerative change most notable at the talonavicular joint.  Findings compatible with chronic/remote Achilles tendinosis.   Electronically Signed   By: Inge Rise M.D.   On: 08/08/2013 21:27   Dg Chest Port 1 View  08/07/2013   CLINICAL DATA:  Nausea  EXAM: PORTABLE CHEST - 1 VIEW  COMPARISON:  June 09, 2013  FINDINGS: There is soft tissue fullness in the paratracheal regions bilaterally which was not present on prior study. Elsewhere, lungs are clear. Heart size and pulmonary vascularity are normal. There is no adenopathy beyond potential adenopathy in the peritracheal regions. No pneumothorax. There is arthropathy in both shoulders. There is postoperative change in the cervical spine inferiorly.  IMPRESSION: Soft tissue fullness in the paratracheal regions bilaterally. Etiology for this soft  tissue fullness is uncertain. Given this finding, upright PA and lateral chest radiographs advised to further evaluate. If soft tissue opacity persists in these regions of the following upright PA and lateral chest imaging, contrast enhanced chest CT would be advised to further assess.  Elsewhere lungs appear clear.   Electronically Signed   By: Lowella Grip   On: 08/07/2013 14:39   Dg Foot Complete Left  08/08/2013   CLINICAL DATA:  Left foot and ankle pain.  EXAM: LEFT FOOT - COMPLETE 3+ VIEW  COMPARISON:  Plain films left  foot 01/29/2011.  FINDINGS: No acute bony or joint abnormality is identified. Degenerative change is seen about the foot, worse at the talonavicular and 1st MTP joints. Calcaneal spur is noted.  IMPRESSION: No acute finding.  Degenerative disease most notable at the talonavicular and 1st MTP joints.   Electronically Signed   By: Inge Rise M.D.   On: 08/08/2013 21:47   Dg Foot Complete Right  08/08/2013   CLINICAL DATA:  Right foot pain and swelling.  EXAM: RIGHT FOOT COMPLETE - 3+ VIEW  COMPARISON:  None.  FINDINGS: No acute bony or joint abnormality is identified. Degenerative change about the talonavicular and tibiotalar joints identified. 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 Soft tissues are unremarkable.  IMPRESSION: No acute finding.  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   Electronically Signed   By: Inge Rise M.D.   On: 08/08/2013 21:45    Medications: I have reviewed the patient's current medications.  Assesment: Principal Problem:   UTI (lower urinary tract infection) Active Problems:   ANEMIA, NORMOCYTIC   Nausea & vomiting   Chronic kidney disease, stage IV (severe)   Cervical myelopathy Gout  Plan: Medications reviewed Will continue IV fluid and current Iv antibiotics pending sensitivity result Will monitor CBC/BMP Will start colchicine 1.2 mg loading dose and then .6 mg po BID   LOS: 2 days   Wateen Varon 08/09/2013, 7:55 AM

## 2013-08-09 NOTE — Progress Notes (Signed)
PT Cancellation Note  Patient Details Name: Colleen Nunez MRN: [id number] DOB: [birth year]   Cancelled Treatment:    Reason Eval/Treat Not Completed: Medical issues which prohibited therapy;Fatigue/lethargy limiting ability to participate Pt declines PT due to generalized malaise and a fever.  Will try to work with her in the AM.  Demetrios Isaacs L 08/09/2013, 3:37 PM

## 2013-08-10 NOTE — Progress Notes (Signed)
Subjective: Patient continued to have episode of fever and abdominal pain. She is growing Klebsiella in her urine. Objective: Vital signs in last 24 hours: Temp:  [97.9 F (36.6 C)-101.5 F (38.6 C)] 99.6 F (37.6 C) (09/24 0512) Pulse Rate:  [98-106] 98 (09/24 0512) Resp:  [20] 20 (09/24 0512) BP: (121-139)/(75-85) 121/82 mmHg (09/24 0512) SpO2:  [100 %] 100 % (09/24 0512) Weight change:  Last BM Date:  (unknown)  Intake/Output from previous day: 09/23 0701 - 09/24 0700 In: 720 [P.O.:720] Out: 600 [Urine:600]  PHYSICAL EXAM General appearance: alert and no distress Resp: clear to auscultation bilaterally Cardio: S1, S2 normal GI: soft, non-tender; bowel sounds normal; no masses,  no organomegaly Extremities: tendeness of the rt ankle joint and rt foot  Lab Results:    @labtest @ [facility] No results found for this basename: PHART, PCO2, PO2ART, TCO2, HCO3,  in the last 72 hours CULTURES Recent Results (from the past 240 hour(s))  URINE CULTURE     Status: None   Collection Time    08/07/13  2:10 PM      Result Value Range Status   Specimen Description URINE, CATHETERIZED   Final   Special Requests NONE   Final   Culture  Setup Time     Final   Value: 08/08/2013 00:11     Performed at [place]     Final   Value: >=100,000 COLONIES/ML     Performed at [facility]   Culture     Final   Value: KLEBSIELLA PNEUMONIAE     Performed at [facility]   Report Status 08/09/2013 FINAL   Final   Organism ID, Bacteria KLEBSIELLA PNEUMONIAE   Final   Studies/Results: Dg Ankle Complete Left  08/08/2013   CLINICAL DATA:  Left ankle pain.  EXAM: LEFT ANKLE COMPLETE - 3+ VIEW  COMPARISON:  Plain films left ankle 08/08/2004.  FINDINGS: No acute bony or joint abnormality is identified. As on the prior study, there is degenerative change most notable about the talonavicular joint. Calcaneal spur is identified.  IMPRESSION: No acute finding.   Talonavicular degenerative change.  Calcaneal spur.   Electronically Signed   By: Inge Rise M.D.   On: 08/08/2013 21:48   Dg Ankle Complete Right  08/08/2013   CLINICAL DATA:  Right ankle and foot pain and swelling.  EXAM: RIGHT ANKLE - COMPLETE 3+ VIEW  COMPARISON:  None.  FINDINGS: No acute bony or joint abnormality is identified. There is degenerative change of the midfoot, worst at the talonavicular joint. Small linear calcification in the Achilles tendon is compatible chronic tendinosis. Tiny well corticated bony fragment off the lateral malleolus is compatible with old trauma.  IMPRESSION: No acute finding.  Degenerative change most notable at the talonavicular joint.  Findings compatible with chronic/remote Achilles tendinosis.   Electronically Signed   By: Inge Rise M.D.   On: 08/08/2013 21:27   Dg Foot Complete Left  08/08/2013   CLINICAL DATA:  Left foot and ankle pain.  EXAM: LEFT FOOT - COMPLETE 3+ VIEW  COMPARISON:  Plain films left foot 01/29/2011.  FINDINGS: No acute bony or joint abnormality is identified. Degenerative change is seen about the foot, worse at the talonavicular and 1st MTP joints. Calcaneal spur is noted.  IMPRESSION: No acute finding.  Degenerative disease most notable at the talonavicular and 1st MTP joints.   Electronically Signed   By: Inge Rise M.D.   On: 08/08/2013  21:47   Dg Foot Complete Right  08/08/2013   CLINICAL DATA:  Right foot pain and swelling.  EXAM: RIGHT FOOT COMPLETE - 3+ VIEW  COMPARISON:  None.  FINDINGS: No acute bony or joint abnormality is identified. Degenerative change about the talonavicular and tibiotalar joints identified. 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 Soft tissues are unremarkable.  IMPRESSION: No acute finding.  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   Electronically Signed   By: Inge Rise M.D.   On: 08/08/2013 21:45    Medications: I have reviewed the patient's current  medications.  Assesment: Principal Problem:   UTI (lower urinary tract infection) Active Problems:   ANEMIA, NORMOCYTIC   Nausea & vomiting   Chronic kidney disease, stage IV (severe)   Cervical myelopathy Gout  Plan: Medications reviewed Will continue IV fluid and current Iv antibiotics  Will monitor CBC/BMP Will start colchicine 1.2 mg loading dose and then .6 mg po BID   LOS: 3 days   Laurice Iglesia 08/10/2013, 8:15 AM

## 2013-08-10 NOTE — Progress Notes (Signed)
Physical Therapy Treatment Patient Details Name: Colleen Nunez MRN: [id number] DOB: [birth year] Today's Date: 08/10/2013 Time: [id number] PT Time Calculation (min): 37 min  PT Assessment / Plan / Recommendation  History of Present Illness     PT Comments   Foot and ankle pain is resolved.  Pt has full active ROM in joints with no pain in feet with weight bearing.  Her primary mobility hindrance now is her left knee which appears to have end stage DJD.  It is mildly edemetous and is painful with walking.  This has been a chronic problem for her.  Gait is still very labored and very slow.  I think it would be appropriate for her to have a w/c at home to minimize the stress of gait on this knee.  She would have to have one ordered.  I do think that she will be able to manage at home with HHPT rather than go to SNF as she is close to her functional baseline.  Follow Up Recommendations  Home health PT     Does the patient have the potential to tolerate intense rehabilitation     Barriers to Discharge        Equipment Recommendations  Wheelchair (measurements PT)    Recommendations for Other Services    Frequency     Progress towards PT Goals Progress towards PT goals: Goals downgraded-see care plan  Plan Discharge plan needs to be updated    Precautions / Restrictions     Pertinent Vitals/Pain     Mobility  Bed Mobility Bed Mobility: Not assessed Transfers Sit to Stand: 6: Modified independent (Device/Increase time);From chair/3-in-1;With upper extremity assist Stand to Sit: To chair/3-in-1;With upper extremity assist Ambulation/Gait Ambulation/Gait Assistance: 6: Modified independent (Device/Increase time) Ambulation Distance (Feet): 30 Feet Assistive device: Rolling walker Gait Pattern: Antalgic General Gait Details: pt now with significant pain in left knee with weight bearing.  Her feet/ankles are back to normal by her report.  Her knee appears to be at end stage DJD with  audible bone crepittus with flexion. Stairs: No Wheelchair Mobility Wheelchair Mobility: No    Exercises General Exercises - Lower Extremity Ankle Circles/Pumps: AROM;Both;10 reps;Supine Quad Sets: AROM;Both;10 reps;Supine Long Arc Quad: AROM;Both;10 reps;Supine   PT Diagnosis:    PT Problem List:   PT Treatment Interventions:     PT Goals (current goals can now be found in the care plan section)    Visit Information  Last PT Received On: 08/10/13    Subjective Data      Cognition  Cognition Arousal/Alertness: Awake/alert Behavior During Therapy: [facility] for tasks assessed/performed Overall Cognitive Status: Within Functional Limits for tasks assessed    Balance  Balance Balance Assessed:  (WNL with walker)  End of Session PT - End of Session Equipment Utilized During Treatment: Gait belt Activity Tolerance: Patient limited by fatigue Patient left: in chair;with call bell/phone within reach   GP     Demetrios Isaacs L 08/10/2013, 1:41 PM

## 2013-08-11 ENCOUNTER — Encounter (HOSPITAL_COMMUNITY): Payer: Self-pay | Admitting: Gastroenterology

## 2013-08-11 ENCOUNTER — Inpatient Hospital Stay (HOSPITAL_COMMUNITY): Payer: Medicaid Other

## 2013-08-11 DIAGNOSIS — R109 Unspecified abdominal pain: Secondary | ICD-10-CM

## 2013-08-11 DIAGNOSIS — R1310 Dysphagia, unspecified: Secondary | ICD-10-CM

## 2013-08-11 DIAGNOSIS — D649 Anemia, unspecified: Secondary | ICD-10-CM

## 2013-08-11 LAB — BASIC METABOLIC PANEL
CO2: 24 mEq/L (ref 19–32)
Chloride: 99 mEq/L (ref 96–112)
Creatinine, Ser: 1.21 mg/dL — ABNORMAL HIGH (ref 0.50–1.10)
GFR calc Af Amer: 56 mL/min — ABNORMAL LOW (ref >90)
Glucose, Bld: 174 mg/dL — ABNORMAL HIGH (ref 70–99)
Sodium: 134 mEq/L — ABNORMAL LOW (ref 135–145)

## 2013-08-11 LAB — CBC
HCT: 20.9 % — ABNORMAL LOW (ref 36.0–46.0)
Hemoglobin: 6.8 g/dL — CL (ref 12.0–15.0)
MCH: 26.5 pg (ref 26.0–34.0)
MCV: 81.3 fL (ref 78.0–100.0)
Platelets: 268 10*3/uL (ref 150–400)
RBC: 2.57 MIL/uL — ABNORMAL LOW (ref 3.87–5.11)
WBC: 9.4 10*3/uL (ref 4.0–10.5)

## 2013-08-11 LAB — IRON AND TIBC: UIBC: 148 ug/dL (ref 125–400)

## 2013-08-11 LAB — FERRITIN: Ferritin: 204 ng/mL (ref 10–291)

## 2013-08-11 LAB — RETICULOCYTES
RBC.: 2.58 MIL/uL — ABNORMAL LOW (ref 3.87–5.11)
Retic Count, Absolute: 28.4 10*3/uL (ref 19.0–186.0)
Retic Ct Pct: 1.1 % (ref 0.4–3.1)

## 2013-08-11 MED ORDER — PANTOPRAZOLE SODIUM 40 MG PO TBEC
40.0000 mg | DELAYED_RELEASE_TABLET | Freq: Two times a day (BID) | ORAL | Status: DC
  Administered 2013-08-12 – 2013-08-22 (×20): 40 mg via ORAL
  Filled 2013-08-11 (×19): qty 1

## 2013-08-11 MED ORDER — ONDANSETRON HCL 4 MG PO TABS
4.0000 mg | ORAL_TABLET | Freq: Three times a day (TID) | ORAL | Status: DC
  Administered 2013-08-12 – 2013-08-22 (×30): 4 mg via ORAL
  Filled 2013-08-11 (×44): qty 1

## 2013-08-11 MED ORDER — IOHEXOL 300 MG/ML  SOLN
50.0000 mL | Freq: Once | INTRAMUSCULAR | Status: AC | PRN
  Administered 2013-08-11: 50 mL via ORAL

## 2013-08-11 MED ORDER — PANTOPRAZOLE SODIUM 40 MG PO TBEC
40.0000 mg | DELAYED_RELEASE_TABLET | Freq: Every day | ORAL | Status: DC
  Administered 2013-08-11: 40 mg via ORAL
  Filled 2013-08-11: qty 1

## 2013-08-11 NOTE — Progress Notes (Signed)
Spoke to lab. Hemoccult not processed b/c card was expired. Will have to be recollected.

## 2013-08-11 NOTE — Consult Note (Signed)
Referring Provider: Rosita Fire, MD Primary Care Physician:  Rosita Fire, MD Primary Gastroenterologist:  Barney Drain, MD  Reason for Consultation:  Abdominal pain, anemia  HPI: Colleen Nunez is a 58 y.o. female admitted with UTI and right ankle/foot pain. Also with complaints of ongoing N/V for several months, severe constipation. She underwent cervical disc surgery in 05/2013. Seen in f/u by her neurosurgeon last week and noted to have fluid collection over anterior neck. Patient states that they are considering draining this in the near future. Patient reports daily vomiting since her surgery. Unspecified amount of weight loss. C/o lower extremity weakness since surgery. Reports no bowel movement since her surgery up until yesterday which would be for about 2 months. She complains of refractory heartburn within the past few weeks. Really no dysphagia. Denies black stool. States she started having some bowel movements yesterday and had multiple episodes of bright red blood mixed in her stool and on the tissue. Complaint of rectal pain. Denies dysuria. No prior EGD or colonoscopy. History positive HCV antibody with no prior workup (previously declined by patient when I saw her several years ago).  We were consulted regarding drop in H/H and persistent lower abdominal pain in setting of current UTI. In 05/2013 her Hgb was 10.9. In 03/2012, her Hgb was 12.2. On admission her Hgb was 8.9 and down to 6.8 today. MCV normal. Anemia profile pending. She is scheduled to get 2 units of blood today.  Temp of 102 this morning. Currently on Rocephin. Also cultures and started for acute gout.  Solid patient back in February 2009 for GERD and difficulty swallowing, anemia. We have scheduled her for colonoscopy and upper endoscopy but for whatever reason this was never gone and she did not followup.  Aleve on a regular basis for chronic knee pain.   Prior to Admission medications   Medication Sig Start Date  End Date Taking? Authorizing Provider  amLODipine (NORVASC) 10 MG tablet Take 10 mg by mouth daily.   Yes Historical Provider, MD  bethanechol (URECHOLINE) 25 MG tablet Take 1 tablet (25 mg total) by mouth 3 (three) times daily. 06/14/13  Yes Otilio Connors, MD  oxyCODONE-acetaminophen (PERCOCET/ROXICET) 5-325 MG per tablet Take 1 tablet by mouth every 4 (four) hours as needed for pain.   Yes Historical Provider, MD  tamsulosin (FLOMAX) 0.4 MG CAPS Take 1 capsule (0.4 mg total) by mouth daily. 06/14/13  Yes Otilio Connors, MD    Current Facility-Administered Medications  Medication Dose Route Frequency Provider Last Rate Last Dose  . 0.9 %  sodium chloride infusion   Intravenous Continuous Samuella Cota, MD 75 mL/hr at 08/10/13 [phone]    . acetaminophen (TYLENOL) tablet 650 mg  650 mg Oral Q6H PRN Samuella Cota, MD   650 mg at 08/11/13 0418   Or  . acetaminophen (TYLENOL) suppository 650 mg  650 mg Rectal Q6H PRN Samuella Cota, MD      . amLODipine (NORVASC) tablet 10 mg  10 mg Oral Daily Samuella Cota, MD   10 mg at 08/10/13 1042  . bethanechol (URECHOLINE) tablet 25 mg  25 mg Oral TID Samuella Cota, MD   25 mg at 08/10/13 2306  . cefTRIAXone (ROCEPHIN) 1 g in dextrose 5 % 50 mL IVPB  1 g Intravenous Q24H Samuella Cota, MD   1 g at 08/10/13 1711  . colchicine tablet 0.6 mg  0.6 mg Oral BID Rosita Fire, MD   0.6 mg  at 08/10/13 2306  . ondansetron (ZOFRAN) tablet 4 mg  4 mg Oral Q6H PRN Samuella Cota, MD       Or  . ondansetron [facility]) injection 4 mg  4 mg Intravenous Q6H PRN Samuella Cota, MD      . oxyCODONE-acetaminophen (PERCOCET/ROXICET) 5-325 MG per tablet 1 tablet  1 tablet Oral Q4H PRN Samuella Cota, MD   1 tablet at 08/09/13 0304  . tamsulosin (FLOMAX) capsule 0.4 mg  0.4 mg Oral Daily Samuella Cota, MD   0.4 mg at 08/10/13 1043    Allergies as of 08/07/2013 - Review Complete 08/07/2013  Allergen Reaction Noted  . Aspirin  12/03/2007    Past  Medical History  Diagnosis Date  . Hypertension   . Arthritis   . Hepatitis C antibody test positive        . Renal insufficiency   . Anxiety   . Depression     h/o suicide attempts in the past  . GERD (gastroesophageal reflux disease)   . Polysubstance abuse     h/o  . Anemia   . Gout     Past Surgical History  Procedure Laterality Date  . Knee arthroscopy    . Abdominal hysterectomy    . Cartilage removal      left knee  . Anterior cervical corpectomy N/A 06/09/2013    Procedure: C4 C5 Corpectomy with C6-7 Anterior cervical fusion/Peek cage/Trestle plate;  Surgeon: Otilio Connors, MD;  Location: [facility];  Service: Neurosurgery;  Laterality: N/A;  Cervical Four, Cervical Five Corpectomy and Cervical six-seven Anterior cervical fusion/Peek cage Three-Five /Trestle Plating Cervical Three to Cervical Seven  . X-stop implantation    . Bilateral soo and appendectomy  2006    Family History  Problem Relation Age of Onset  . Diabetes Sister   . Diabetes Mother     deceased age 33  . Kidney failure Mother   . Alcohol abuse Father   . Colon cancer Neg Hx     History   Social History  . Marital Status: Single    Spouse Name: N/A    Number of Children: 2  . Years of Education: N/A   Occupational History  . Not on file.   Social History Main Topics  . Smoking status: Never Smoker   . Smokeless tobacco: Not on file  . Alcohol Use: 1.8 oz/week    3 Cans of beer per week     Comment: Denies alcohol use since around March of 2014  . Drug Use: No     Comment: h/o crack cocaine in past. Clean since 2006  . Sexual Activity: Yes    Birth Control/ Protection: Surgical   Other Topics Concern  . Not on file   Social History Narrative  . No narrative on file     ROS:  General: see hpi Eyes: Negative for vision changes.  ENT: Negative for hoarseness, difficulty swallowing , nasal congestion. CV: Negative for chest pain, angina, palpitations, dyspnea on exertion,  peripheral edema.  Respiratory: Negative for dyspnea at rest, dyspnea on exertion, cough, sputum, wheezing.  GI: See history of present illness. GU:  Negative for dysuria, hematuria, urinary incontinence, urinary frequency, nocturnal urination.  MS: foot pain, no low back pain.  Derm: Negative for rash or itching.  Neuro: Negative for weakness, abnormal sensation, seizure, frequent headaches, memory loss, confusion.  Psych: Negative for anxiety, depression, suicidal ideation, hallucinations.  Endo: unspecified amount of weight loss.  Heme: Negative for bruising or bleeding. Allergy: Negative for rash or hives.       Physical Examination: Vital signs in last 24 hours: Temp:  [98.5 F (36.9 C)-102 F (38.9 C)] 98.5 F (36.9 C) (09/25 0630) Pulse Rate:  [84-122] 84 (09/25 0845) Resp:  [20] 20 (09/25 0404) BP: (113-119)/(65-80) 118/80 mmHg (09/25 0845) SpO2:  [93 %-100 %] 100 % (09/25 0845) Last BM Date: 08/10/13  General: Well-nourished, well-developed in no acute distress.  Head: Normocephalic, atraumatic.   Eyes: Conjunctiva pink, no icterus. Mouth: Oropharyngeal mucosa moist and pink , no lesions erythema or exudate. Neck: Supple without thyromegaly,  lymphadenopathy. Soft tissue mass anteriorly, golf ball sized. Not warm to touch.  Lungs: Clear to auscultation bilaterally.  Heart: Regular rate and rhythm, no murmurs rubs or gallops.  Abdomen: Bowel sounds are normal,moderate lower abd tenderness, nondistended, no hepatosplenomegaly or masses, no abdominal bruits or    hernia , no rebound or guarding.   Rectal: no external lesion. Nontender. No masses or stool. Brown secretions sent to lab for hemoccult. Extremities: No lower extremity edema, clubbing, deformity.  Neuro: Alert and oriented x 4 , grossly normal neurologically.  Skin: Warm and dry, no rash or jaundice.   Psych: Alert and cooperative, normal mood and affect.        Intake/Output from previous day: 09/24 0701 -  09/25 0700 In: 440 [P.O.:440] Out: 150 [Urine:150] Intake/Output this shift:    Lab Results: CBC  Recent Labs  08/11/13 0522  WBC 9.4  HGB 6.8*  HCT 20.9*  MCV 81.3  PLT 268   BMET  Recent Labs  08/11/13 0522  NA 134*  K 3.3*  CL 99  CO2 24  GLUCOSE 174*  BUN 6  CREATININE 1.21*  CALCIUM 8.8   Lab Results  Component Value Date   ALT 8 08/07/2013   AST 25 08/07/2013   ALKPHOS 51 08/07/2013   BILITOT 0.6 08/07/2013     PT/INR Lab Results  Component Value Date   INR 0.99 06/09/2013       Imaging Studies: Dg Ankle Complete Left  2013-08-12   CLINICAL DATA:  Left ankle pain.  EXAM: LEFT ANKLE COMPLETE - 3+ VIEW  COMPARISON:  Plain films left ankle 12-Aug-2004.  FINDINGS: No acute bony or joint abnormality is identified. As on the prior study, there is degenerative change most notable about the talonavicular joint. Calcaneal spur is identified.  IMPRESSION: No acute finding.  Talonavicular degenerative change.  Calcaneal spur.   Electronically Signed   By: Inge Rise M.D.   On: 08/12/13 21:48   Dg Ankle Complete Right  Aug 12, 2013   CLINICAL DATA:  Right ankle and foot pain and swelling.  EXAM: RIGHT ANKLE - COMPLETE 3+ VIEW  COMPARISON:  None.  FINDINGS: No acute bony or joint abnormality is identified. There is degenerative change of the midfoot, worst at the talonavicular joint. Small linear calcification in the Achilles tendon is compatible chronic tendinosis. Tiny well corticated bony fragment off the lateral malleolus is compatible with old trauma.  IMPRESSION: No acute finding.  Degenerative change most notable at the talonavicular joint.  Findings compatible with chronic/remote Achilles tendinosis.   Electronically Signed   By: Inge Rise M.D.   On: 08-12-2013 21:27   Dg Chest Port 1 View  08/07/2013   CLINICAL DATA:  Nausea  EXAM: PORTABLE CHEST - 1 VIEW  COMPARISON:  June 09, 2013  FINDINGS: There is soft tissue fullness in the paratracheal regions  bilaterally which was not present on prior study. Elsewhere, lungs are clear. Heart size and pulmonary vascularity are normal. There is no adenopathy beyond potential adenopathy in the peritracheal regions. No pneumothorax. There is arthropathy in both shoulders. There is postoperative change in the cervical spine inferiorly.  IMPRESSION: Soft tissue fullness in the paratracheal regions bilaterally. Etiology for this soft tissue fullness is uncertain. Given this finding, upright PA and lateral chest radiographs advised to further evaluate. If soft tissue opacity persists in these regions of the following upright PA and lateral chest imaging, contrast enhanced chest CT would be advised to further assess.  Elsewhere lungs appear clear.   Electronically Signed   By: Lowella Grip   On: 08/07/2013 14:39   Dg Foot Complete Left  08/08/2013   CLINICAL DATA:  Left foot and ankle pain.  EXAM: LEFT FOOT - COMPLETE 3+ VIEW  COMPARISON:  Plain films left foot 01/29/2011.  FINDINGS: No acute bony or joint abnormality is identified. Degenerative change is seen about the foot, worse at the talonavicular and 1st MTP joints. Calcaneal spur is noted.  IMPRESSION: No acute finding.  Degenerative disease most notable at the talonavicular and 1st MTP joints.   Electronically Signed   By: Inge Rise M.D.   On: 08/08/2013 21:47   Dg Foot Complete Right  08/08/2013   CLINICAL DATA:  Right foot pain and swelling.  EXAM: RIGHT FOOT COMPLETE - 3+ VIEW  COMPARISON:  None.  FINDINGS: No acute bony or joint abnormality is identified. Degenerative change about the talonavicular and tibiotalar joints identified. 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 Soft tissues are unremarkable.  IMPRESSION: No acute finding.  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   Electronically Signed   By: Inge Rise M.D.   On: 08/08/2013 21:45  [4 week]   Impression: 57 year old lady with past medical history significant for cervical  disc surgery towards the end of July 2014 who presents with complaints of frequent nausea vomiting, constipation/obstipation, lower extremity weakness since that time. Difficult historian. Reports no bowel movement in 2 months until yesterday. Reports multiple episodes of small volume bright red blood per rectum associated with hard stool since admission no documentation in EPIC. Complains of lower abdominal pain in the setting of UTI (Klebsiella pneumoniae). Febrile this morning with temp of 102. Soft tissue swelling over the anterior neck region postoperatively.  Acute on chronic anemia with significant drop since admission. Daily NSAID use. Normocytic anemia. Would be concerned about upper GI etiology. Bright red blood per rectum per patient's account seems to be small volume and may be incidental finding in the setting of chronic constipation and had nothing to do with her significant drop in H&H.  HCV Ab+.   Plan: 1. Transfuse as needed. 2. Add PPI. 3. Consider upper endoscopy initially with a colonoscopy at a later time. Timing of both will need to be determined by Dr. Oneida Alar in the setting of active febrile illness (question related to UTI or other source). Patient may need to have imaging of the abdomen prior to further workup. Blood cultures are pending. 4. HCV RNA. Patient wants to know if active Hep C.   LOS: 4 days   Neil Crouch  08/11/2013, 9:44 AM  Addendum: discussed with Dr. Oneida Alar. Plan for oral contrast only CT A/P today for abdominal pain/obstipation/anemia/fever.

## 2013-08-11 NOTE — Progress Notes (Signed)
Subjective: Patient is complaining of lower abdominal pain. Her H/H dropped. Objective: Vital signs in last 24 hours: Temp:  [98.5 F (36.9 C)-102 F (38.9 C)] 98.5 F (36.9 C) (09/25 0630) Pulse Rate:  [86-122] 119 (09/25 0404) Resp:  [20] 20 (09/25 0404) BP: (113-119)/(65-80) 119/78 mmHg (09/25 0404) SpO2:  [93 %-98 %] 98 % (09/25 0404) Weight change:  Last BM Date: 08/10/13  Intake/Output from previous day: 09/24 0701 - 09/25 0700 In: 440 [P.O.:440] Out: 150 [Urine:150]  PHYSICAL EXAM General appearance: alert and no distress Resp: clear to auscultation bilaterally Cardio: S1, S2 normal GI: soft, non-tender; bowel sounds normal; no masses,  no organomegaly Extremities: tendeness of the rt ankle joint and rt foot  Lab Results:    @labtest @ [facility] No results found for this basename: PHART, PCO2, PO2ART, TCO2, HCO3,  in the last 72 hours CULTURES Recent Results (from the past 240 hour(s))  URINE CULTURE     Status: None   Collection Time    08/07/13  2:10 PM      Result Value Range Status   Specimen Description URINE, CATHETERIZED   Final   Special Requests NONE   Final   Culture  Setup Time     Final   Value: 08/08/2013 00:11     Performed at [facility]     Final   Value: >=100,000 COLONIES/ML     Performed at [facility]   Culture     Final   Value: KLEBSIELLA PNEUMONIAE     Performed at [facility]   Report Status 08/09/2013 FINAL   Final   Organism ID, Bacteria KLEBSIELLA PNEUMONIAE   Final  CULTURE, BLOOD (ROUTINE X 2)     Status: None   Collection Time    08/09/13  3:37 PM      Result Value Range Status   Specimen Description BLOOD RIGHT HAND   Final   Special Requests     Final   Value: BOTTLES DRAWN AEROBIC AND ANAEROBIC AEB=15CC ANA=12CC   Culture NO GROWTH 1 DAY   Final   Report Status PENDING   Incomplete  CULTURE, BLOOD (ROUTINE X 2)     Status: None   Collection Time    08/09/13  3:40 PM      Result  Value Range Status   Specimen Description BLOOD LEFT ANTECUBITAL   Final   Special Requests     Final   Value: BOTTLES DRAWN AEROBIC AND ANAEROBIC AEB=12CC ANA=8CC   Culture NO GROWTH 1 DAY   Final   Report Status PENDING   Incomplete   Studies/Results: No results found.  Medications: I have reviewed the patient's current medications.  Assesment: Principal Problem:   UTI (lower urinary tract infection) Active Problems:   ANEMIA, NORMOCYTIC   Nausea & vomiting   Chronic kidney disease, stage IV (severe)   Cervical myelopathy Gout Anaemia  Plan: Medications reviewed Will continue IV fluid and current Iv antibiotics  Type and cross match and transfuse 2 units PRBC GI consult  LOS: 4 days   Swayze Pries 08/11/2013, 8:00 AM

## 2013-08-11 NOTE — Consult Note (Signed)
REVIEWED. Pt has difficulty swallowing solid foods due to a feeling like her throat is closing. Pt has fluid collection in left anterior neck. PT NEEDS CT OF NECK AND/OR MBS.

## 2013-08-11 NOTE — Progress Notes (Signed)
Physical Therapy Treatment Patient Details Name: Colleen Nunez MRN: [id number] DOB: 13-Oct-1956 Today's Date: 08/11/2013 Time: [id number] PT Time Calculation (min): 27 min  PT Assessment / Plan / Recommendation  History of Present Illness     PT Comments   Pt tolerated bed mobility, bed exercises and transfer training with min assistance required and cueing for hand placement.  Pt refused gait training this session due to expecting blood later today and wished to return to bed following transfer to toilet.  Pt educated on benefits of sitting up to reduce risk of bed sores and pneumonia.  Pt left in bed with call bell within reach.  Pt reported knee felt better following bed exercises, no reports of pain or discomfort through session.    Follow Up Recommendations        Does the patient have the potential to tolerate intense rehabilitation     Barriers to Discharge        Equipment Recommendations       Recommendations for Other Services    Frequency     Progress towards PT Goals    Plan      Precautions / Restrictions Precautions Precautions: Fall Restrictions Weight Bearing Restrictions: No    Mobility  Bed Mobility Bed Mobility: Rolling Left;Right Sidelying to Sit Rolling Left: 5: Supervision (cueing for handplacement) Right Sidelying to Sit: 5: Supervision (cueng for handplacement to assist) Transfers Transfers: Sit to Stand;Stand to Sit;Stand Pivot Transfers Sit to Stand: 6: Modified independent (Device/Increase time);With upper extremity assist;From bed;From toilet Stand to Sit: 6: Modified independent (Device/Increase time);4: Min assist;With upper extremity assist;To toilet;To bed    Exercises General Exercises - Lower Extremity Ankle Circles/Pumps: AROM;10 reps;Supine Quad Sets: AROM;Both;Supine Short Arc Quad: AROM;Both;10 reps;Supine Heel Slides: Both;AAROM;10 reps   PT Diagnosis:    PT Problem List:   PT Treatment Interventions:     PT Goals (current  goals can now be found in the care plan section)    Visit Information  Last PT Received On: 08/11/13    Subjective Data   Pt reported pain free today.  Pt expecting blood to be taken later today and wishes to stay in bed.     Cognition  Cognition Arousal/Alertness: Awake/alert Behavior During Therapy: WFL for tasks assessed/performed Overall Cognitive Status: Within Functional Limits for tasks assessed    Balance     End of Session PT - End of Session Equipment Utilized During Treatment: Gait belt Activity Tolerance: Patient tolerated treatment well;Patient limited by fatigue Patient left: in bed;with call bell/phone within reach Nurse Communication: Mobility status   GP     Aldona Lento 08/11/2013, 9:08 AM

## 2013-08-11 NOTE — Progress Notes (Addendum)
REVIEWED. Pt has difficulty swallowing solid foods due to a feeling like her throat is closing. Pt has fluid collection in left anterior neck. PT NEEDS CT OF NECK AND/OR MBS.

## 2013-08-11 NOTE — Plan of Care (Signed)
Problem: Acute Rehab PT Goals(only PT should resolve) Goal: Pt Will Ambulate Outcome: Not Progressing Pt refused gait training this session due to Lt knee, did transfer bed <> toilet

## 2013-08-11 NOTE — Progress Notes (Signed)
Pt IV infiltrated while she was receiving blood. Will try to get another IV. WIll continue to monitor.

## 2013-08-11 NOTE — Progress Notes (Signed)
Radiology called with results of CT scan of the neck. Results as follows:   "Very large prevertebral but trans-spatial fluid collection  encompassing 8.6 x 7.3 x 11.6 cm. This tracks to the skin surface at  the level of the left thyroid and is most compatible with a  postoperative CSF leak.  Associated regional mass effect, including severe effacement of the  hypopharynx and upper esophagus."  Dr. Oneida Alar was called as she was the provider who ordered the CT scan. She told RN to call Dr. Legrand Rams with results as that is her PCP. Dr. Legrand Rams is not on call this evening and Dr. Cindie Laroche was on call for Dr. Legrand Rams. Results were called to Dr. Cindie Laroche and he ordered a CBC and BMET for the morning, and asked nursing to relay information to Dr. Legrand Rams in the morning. Will make sure to report information off to night nurse so that the day nurse can be sure to let Dr. Legrand Rams know about the results in the morning. Will continue to monitor closely.

## 2013-08-12 ENCOUNTER — Encounter (HOSPITAL_COMMUNITY): Payer: Self-pay | Admitting: Certified Registered"

## 2013-08-12 ENCOUNTER — Inpatient Hospital Stay (HOSPITAL_COMMUNITY): Payer: Medicaid Other | Admitting: Certified Registered"

## 2013-08-12 ENCOUNTER — Encounter (HOSPITAL_COMMUNITY)
Admission: EM | Disposition: A | Discharge status: Rehabilitation Facility | Payer: Self-pay | Source: Home / Self Care | Attending: Internal Medicine

## 2013-08-12 HISTORY — PX: ANTERIOR CERVICAL DECOMP/DISCECTOMY FUSION: SHX1161

## 2013-08-12 LAB — CBC WITH DIFFERENTIAL/PLATELET
Basophils Relative: 0 % (ref 0–1)
Eosinophils Absolute: 0.3 10*3/uL (ref 0.0–0.7)
Eosinophils Relative: 4 % (ref 0–5)
HCT: 28.4 % — ABNORMAL LOW (ref 36.0–46.0)
Hemoglobin: 9.5 g/dL — ABNORMAL LOW (ref 12.0–15.0)
Lymphs Abs: 2.4 10*3/uL (ref 0.7–4.0)
MCH: 26.7 pg (ref 26.0–34.0)
MCV: 79.8 fL (ref 78.0–100.0)
Monocytes Absolute: 0.6 10*3/uL (ref 0.1–1.0)
Monocytes Relative: 10 % (ref 3–12)
RBC: 3.56 MIL/uL — ABNORMAL LOW (ref 3.87–5.11)

## 2013-08-12 LAB — BASIC METABOLIC PANEL
BUN: 4 mg/dL — ABNORMAL LOW (ref 6–23)
Calcium: 8.9 mg/dL (ref 8.4–10.5)
GFR calc Af Amer: 66 mL/min — ABNORMAL LOW (ref >90)
GFR calc non Af Amer: 57 mL/min — ABNORMAL LOW (ref >90)
Glucose, Bld: 97 mg/dL (ref 70–99)
Sodium: 137 mEq/L (ref 135–145)

## 2013-08-12 SURGERY — ANTERIOR CERVICAL DECOMPRESSION/DISCECTOMY FUSION 1 LEVEL
Anesthesia: General | Site: Neck | Wound class: Clean

## 2013-08-12 MED ORDER — THROMBIN 20000 UNITS EX KIT
PACK | CUTANEOUS | Status: DC | PRN
  Administered 2013-08-12: via TOPICAL

## 2013-08-12 MED ORDER — CEFAZOLIN SODIUM-DEXTROSE 2-3 GM-% IV SOLR
INTRAVENOUS | Status: AC
  Filled 2013-08-12: qty 50

## 2013-08-12 MED ORDER — PROPOFOL 10 MG/ML IV BOLUS
INTRAVENOUS | Status: DC | PRN
  Administered 2013-08-12: 50 mg via INTRAVENOUS
  Administered 2013-08-12: 150 mg via INTRAVENOUS

## 2013-08-12 MED ORDER — LIDOCAINE HCL (CARDIAC) 20 MG/ML IV SOLN
INTRAVENOUS | Status: DC | PRN
  Administered 2013-08-12: 100 mg via INTRAVENOUS

## 2013-08-12 MED ORDER — THROMBIN 20000 UNITS EX SOLR
CUTANEOUS | Status: AC
  Filled 2013-08-12: qty 20000

## 2013-08-12 MED ORDER — DEXAMETHASONE SODIUM PHOSPHATE 4 MG/ML IJ SOLN
INTRAMUSCULAR | Status: DC | PRN
  Administered 2013-08-12: 4 mg via INTRAVENOUS

## 2013-08-12 MED ORDER — ONDANSETRON HCL 4 MG/2ML IJ SOLN
INTRAMUSCULAR | Status: DC | PRN
  Administered 2013-08-12: 4 mg via INTRAVENOUS

## 2013-08-12 MED ORDER — MIDAZOLAM HCL 5 MG/5ML IJ SOLN
INTRAMUSCULAR | Status: DC | PRN
  Administered 2013-08-12: 2 mg via INTRAVENOUS

## 2013-08-12 MED ORDER — SUFENTANIL CITRATE 50 MCG/ML IV SOLN
INTRAVENOUS | Status: DC | PRN
  Administered 2013-08-12: 10 ug via INTRAVENOUS

## 2013-08-12 MED ORDER — LACTATED RINGERS IV SOLN
INTRAVENOUS | Status: DC | PRN
  Administered 2013-08-12: 23:00:00 via INTRAVENOUS

## 2013-08-12 MED ORDER — GLYCOPYRROLATE 0.2 MG/ML IJ SOLN
INTRAMUSCULAR | Status: DC | PRN
  Administered 2013-08-12: 0.2 mg via INTRAVENOUS

## 2013-08-12 MED ORDER — SUCCINYLCHOLINE CHLORIDE 20 MG/ML IJ SOLN
INTRAMUSCULAR | Status: DC | PRN
  Administered 2013-08-12: 120 mg via INTRAVENOUS

## 2013-08-12 MED ORDER — POTASSIUM CHLORIDE CRYS ER 20 MEQ PO TBCR
40.0000 meq | EXTENDED_RELEASE_TABLET | Freq: Two times a day (BID) | ORAL | Status: AC
  Administered 2013-08-12: 40 meq via ORAL
  Filled 2013-08-12: qty 2

## 2013-08-12 MED ORDER — 0.9 % SODIUM CHLORIDE (POUR BTL) OPTIME
TOPICAL | Status: DC | PRN
  Administered 2013-08-12: 1000 mL

## 2013-08-12 MED ORDER — CEFAZOLIN SODIUM-DEXTROSE 2-3 GM-% IV SOLR
INTRAVENOUS | Status: DC | PRN
  Administered 2013-08-12: 2 g via INTRAVENOUS

## 2013-08-12 SURGICAL SUPPLY — 46 items
APL SKNCLS STERI-STRIP NONHPOA (GAUZE/BANDAGES/DRESSINGS) ×1
BANDAGE GAUZE ELAST BULKY 4 IN (GAUZE/BANDAGES/DRESSINGS) ×4 IMPLANT
BENZOIN TINCTURE PRP APPL 2/3 (GAUZE/BANDAGES/DRESSINGS) ×2 IMPLANT
BRUSH SCRUB EZ PLAIN DRY (MISCELLANEOUS) ×2 IMPLANT
CANISTER SUCTION 2500CC (MISCELLANEOUS) ×2 IMPLANT
CLOTH BEACON ORANGE TIMEOUT ST (SAFETY) ×2 IMPLANT
CLSR STERI-STRIP ANTIMIC 1/2X4 (GAUZE/BANDAGES/DRESSINGS) ×2 IMPLANT
CONT SPEC 4OZ CLIKSEAL STRL BL (MISCELLANEOUS) ×2 IMPLANT
CORDS BIPOLAR (ELECTRODE) ×2 IMPLANT
COVER MAYO STAND STRL (DRAPES) ×2 IMPLANT
DRAIN BAG CSF ACCUDRAIN (MISCELLANEOUS) ×2 IMPLANT
DRAIN SUBARACHNOID (WOUND CARE) ×2 IMPLANT
DRAPE INCISE IOBAN 66X45 STRL (DRAPES) IMPLANT
DRAPE MICROSCOPE LEICA (MISCELLANEOUS) ×2 IMPLANT
DRAPE ORTHO SPLIT 77X108 STRL (DRAPES)
DRAPE PED LAPAROTOMY (DRAPES) IMPLANT
DRAPE POUCH INSTRU U-SHP 10X18 (DRAPES) ×2 IMPLANT
DRAPE PROXIMA HALF (DRAPES) IMPLANT
DRAPE SURG ORHT 6 SPLT 77X108 (DRAPES) IMPLANT
ELECT CAUTERY BLADE 6.4 (BLADE) ×2 IMPLANT
ELECT REM PT RETURN 9FT ADLT (ELECTROSURGICAL) ×2
ELECTRODE REM PT RTRN 9FT ADLT (ELECTROSURGICAL) ×1 IMPLANT
GAUZE SPONGE 4X4 16PLY XRAY LF (GAUZE/BANDAGES/DRESSINGS) IMPLANT
GLOVE BIO SURGEON STRL SZ 6.5 (GLOVE) ×2 IMPLANT
GLOVE BIO SURGEON STRL SZ7 (GLOVE) ×2 IMPLANT
GLOVE BIOGEL M 8.0 STRL (GLOVE) ×2 IMPLANT
GOWN STRL NON-REIN LRG LVL3 (GOWN DISPOSABLE) ×2 IMPLANT
HEAD HALTER (SOFTGOODS) IMPLANT
KIT BASIN OR (CUSTOM PROCEDURE TRAY) ×2 IMPLANT
KIT ROOM TURNOVER OR (KITS) ×2 IMPLANT
NEEDLE HYPO 18GX1.5 BLUNT FILL (NEEDLE) IMPLANT
NEEDLE SPNL 22GX3.5 QUINCKE BK (NEEDLE) ×2 IMPLANT
NS IRRIG 1000ML POUR BTL (IV SOLUTION) ×2 IMPLANT
PACK LAMINECTOMY NEURO (CUSTOM PROCEDURE TRAY) ×2 IMPLANT
PAD ARMBOARD 7.5X6 YLW CONV (MISCELLANEOUS) ×4 IMPLANT
RUBBERBAND STERILE (MISCELLANEOUS) ×2 IMPLANT
SPONGE GAUZE 4X4 12PLY (GAUZE/BANDAGES/DRESSINGS) ×2 IMPLANT
SPONGE INTESTINAL PEANUT (DISPOSABLE) ×2 IMPLANT
SPONGE SURGIFOAM ABS GEL 100 (HEMOSTASIS) ×2 IMPLANT
SPONGE SURGIFOAM ABS GEL SZ50 (HEMOSTASIS) IMPLANT
STRIP CLOSURE SKIN 1/2X4 (GAUZE/BANDAGES/DRESSINGS) ×2 IMPLANT
SUT VIC AB 3-0 SH 8-18 (SUTURE) ×4 IMPLANT
SYR 20ML ECCENTRIC (SYRINGE) ×2 IMPLANT
TOWEL OR 17X24 6PK STRL BLUE (TOWEL DISPOSABLE) ×2 IMPLANT
TOWEL OR 17X26 10 PK STRL BLUE (TOWEL DISPOSABLE) ×2 IMPLANT
WATER STERILE IRR 1000ML POUR (IV SOLUTION) ×2 IMPLANT

## 2013-08-12 NOTE — Progress Notes (Signed)
Subjective: Patient is complaining of nausea and vomiting. Continue to have lower abdominal pain. She was transfused 2 units of PRBC. She had Ct of the abdomen and the neck. Ct scan of the neck showed prevertebral fluid collection which is due to csf leak following her surgery. This well known to her neurosurgeon and she appointment with him to fix the leak and aspirate the fluid. Dr. Cindie Laroche who was on call, called the neurosurgeon and talked to who who said patient doesn't need to be transferred and he will follow her after she is discharged from here. Objective: Vital signs in last 24 hours: Temp:  [97.8 F (36.6 C)-98.7 F (37.1 C)] 98.7 F (37.1 C) (09/26 0454) Pulse Rate:  [84-102] 86 (09/26 0454) Resp:  [18] 18 (09/26 0454) BP: (118-154)/(80-97) 154/87 mmHg (09/26 0454) SpO2:  [97 %-100 %] 100 % (09/26 0454) Weight change:  Last BM Date: 08/11/13  Intake/Output from previous day: 09/25 0701 - 09/26 0700 In: 1757.5 [P.O.:720; Blood:837.5; IV Piggyback:200] Out: 750 [Urine:750]  PHYSICAL EXAM General appearance: alert and no distress Resp: clear to auscultation bilaterally Cardio: S1, S2 normal GI: soft, non-tender; bowel sounds normal; no masses,  no organomegaly Extremities: tendeness of the rt ankle joint and rt foot  Lab Results:    @labtest @ [facility] No results found for this basename: PHART, PCO2, PO2ART, TCO2, HCO3,  in the last 72 hours CULTURES Recent Results (from the past 240 hour(s))  URINE CULTURE     Status: None   Collection Time    08/07/13  2:10 PM      Result Value Range Status   Specimen Description URINE, CATHETERIZED   Final   Special Requests NONE   Final   Culture  Setup Time     Final   Value: 08/08/2013 00:11     Performed at [facility]     Final   Value: >=100,000 COLONIES/ML     Performed at [facility]   Culture     Final   Value: KLEBSIELLA PNEUMONIAE     Performed at [facility]   Report  Status 08/09/2013 FINAL   Final   Organism ID, Bacteria KLEBSIELLA PNEUMONIAE   Final  CULTURE, BLOOD (ROUTINE X 2)     Status: None   Collection Time    08/09/13  3:37 PM      Result Value Range Status   Specimen Description BLOOD RIGHT HAND   Final   Special Requests     Final   Value: BOTTLES DRAWN AEROBIC AND ANAEROBIC AEB=15CC ANA=12CC   Culture NO GROWTH 3 DAYS   Final   Report Status PENDING   Incomplete  CULTURE, BLOOD (ROUTINE X 2)     Status: None   Collection Time    08/09/13  3:40 PM      Result Value Range Status   Specimen Description BLOOD LEFT ANTECUBITAL   Final   Special Requests     Final   Value: BOTTLES DRAWN AEROBIC AND ANAEROBIC AEB=12CC ANA=8CC   Culture NO GROWTH 3 DAYS   Final   Report Status PENDING   Incomplete   Studies/Results: Ct Abdomen Pelvis Wo Contrast  08/11/2013   CLINICAL DATA:  Abdomen pain. Unexplained anemia. Constipation  EXAM: CT ABDOMEN AND PELVIS WITHOUT CONTRAST  TECHNIQUE: Multidetector CT imaging of the abdomen and pelvis was performed following the standard protocol without intravenous contrast. Oral contrast is administered.  COMPARISON:  June 04, 2005  FINDINGS:  There is focal fatty infiltration or liver near the falciform ligament. The liver is otherwise normal. The spleen, pancreas, gallbladder, adrenal glands are normal. The left kidney is atrophic. There is mild right perinephric stranding. There is no right hydronephrosis or right renal stones. Small right parapelvic renal cyst is unchanged. The aorta is normal. There is no abdominal lymphadenopathy. There is no small bowel obstruction or diverticulitis. There is mild thickening of of colonic wall in the transverse colon probably due to under distension. The appendix is not seen but no inflammation is noted around the cecum.  The bladder is partial decompressed with mild diffuse bladder wall thickening nonspecific, probably due to under distension. Pelvic phleboliths are identified. The  lung bases are clear. Degenerative joint changes of the spine are identified.  IMPRESSION: Mild right perinephric stranding without evidence of obstruction or renal stone. This is nonspecific but can be seen in pyelonephritis. Clinical correlation is recommended. No acute abnormalities identified in the pelvis.   Electronically Signed   By: Abelardo Diesel   On: 08/11/2013 13:00   Ct Soft Tissue Neck Wo Contrast  08/11/2013   CLINICAL DATA:  57 year old female with difficulty swallowing. Cervical spine surgery in July with subsequent swelling. Fluid collection.  EXAM: CT NECK WITHOUT CONTRAST  TECHNIQUE: Multidetector CT imaging of the neck was performed following the standard protocol without intravenous contrast.  COMPARISON:  Cervical spine MRI and CT 06/03/2013.  FINDINGS: Sequelae of cervical spine surgery from the C3 level to C7, including C4 and C5 corpectomies. Anterior cervical spine hardware present from the C3 to the C7 levels. Insert cervicothoracic Bilateral posterior element alignment is within normal limits. Insert skullbase  Very large trans spatial and mildly lobulated fluid collection tracks from the prevertebral space anteriorly and abuts the skin surface at the level of the left thyroid. This encompasses 8.6 x 7.3 x 11.6 cm (AP by transverse by CC). The fluid is simple by densitometry (7 Hounsfield units). There is associated mass effect on neck structures, including lateral displacement of the left carotid space, complete effacement of the cervical and upper thoracic esophagus as well as anterior displacement of the hypopharynx and airway.  No surrounding inflammatory stranding, including no inflammation in the superior mediastinum.  Negative visualized non contrast brain parenchyma, nasopharynx, superior parapharyngeal spaces, sublingual space, submandibular glands and parotid glands. Chronic left orbital floor fracture re- identified.  IMPRESSION: Very large prevertebral but trans-spatial  fluid collection encompassing 8.6 x 7.3 x 11.6 cm. This tracks to the skin surface at the level of the left thyroid and is most compatible with a postoperative CSF leak.  Associated regional mass effect, including severe effacement of the hypopharynx and upper esophagus.  These results will be called to the ordering clinician or representative by the Radiologist Assistant, and communication documented in the PACS Dashboard.   Electronically Signed   By: Lars Pinks M.D.   On: 08/11/2013 17:14    Medications: I have reviewed the patient's current medications.  Assesment: Principal Problem:   UTI (lower urinary tract infection) Active Problems:   ANEMIA, NORMOCYTIC   Nausea & vomiting   Chronic kidney disease, stage IV (severe)   Cervical myelopathy Gout Anaemia  Plan: Medications reviewed Will continue IV fluid and current Iv antibiotics  GI consult appreciated. Cbc/BMP in am.  LOS: 5 days   Manning Luna 08/12/2013, 8:10 AM

## 2013-08-12 NOTE — Progress Notes (Signed)
Patient seen and examined, chart reviewed, it seems her main issue is progressively worsening dysphagia-resulting in nausea and vomiting. At this time, her CSF leak from recent c-spine surgery-seems to have expanded, and has caused pressure effect on her esophagua/hypopharynx-thereby causing dysphagia. Spoke with Dr Joya Salm, Neurosurgeon on call, who asked patient be kept NPO, and he will evaluate the patient shortly. Have updated RN and patient. Continue with current orders for now.

## 2013-08-12 NOTE — Consult Note (Signed)
  Patient who underwent corpectomy by dr Luiz Ochoa back in July 2014. Since then she developed nausea and some vomiting. Also she noticed increase of the size of her neck with some difficulties to swallow. She was seen by dr Luiz Ochoa in he office lastweek and she was told that she might reqiuere drainage and a lumbar drain. She was transferred from [facility] werre she has been for the past 4 days. Clinically, awake, stable. Neck shows an anterior scar with a large fluid collection with displacement of the trachea. i did review the ct neck which shows a large fluid collection. Plan is to go ahead with a lumbar drain and evacuate the cervical fluid collection. She is aware that after surgery she will be in the icu for at least 4 to 5 days. She is aware of risks and benefits.

## 2013-08-12 NOTE — Progress Notes (Signed)
NUTRITION FOLLOW UP  Intervention:   1. If expected pt will be NPO for a prolonged time post op, would benefit from early initiation of enteral nutrition.  2. Pt is at increased risk for refeeding syndrome given prolonged poor intake, may benefit from monitoring Mag, Phos, and Potassium once able to start nutrition (oral or enteral).  3. Recommend obtaining daily weights 4. RD will continue to follow   Nutrition Dx:   Inadequate oral intake related to ongoing emesis as evidenced by pt diet hx.   Goal:   Pt to meet >/= 90% of their estimated nutrition needs  Monitor:   Diet advance vs enteral nutrition, weight trends, labs   Assessment:   Patient who underwent corpectomy in 7/14, has had increased swelling in neck and decreased ability to swallow. CT showed large fluid collection, with plans to drain. Per notes review, pt has not been able to tolerate any PO intake, and expected that with current fluid it would not be possible to pass a NG tube at this time.  Pt reports that she has had poor intake since her surgery in 05/2013.   Once fluid is drained, if pt is able to take oral nutrition, will likely benefit from oral nutrition supplements to support adequate intake. If unable to tolerate oral intake, would benefit from early initiation of enteral nutrition given prolonged poor intake.   Height: Ht Readings from Last 1 Encounters:  08/07/13 5\' 2"  (1.575 m)    Weight Status:   Wt Readings from Last 1 Encounters:  08/07/13 190 lb 11.2 oz (86.5 kg)  no new weight since admission   Re-estimated needs:  Kcal: 1400-1600  Protein: 75-85 gr  Fluid: >2000 ml/day  Skin: intact   Diet Order: NPO   Intake/Output Summary (Last 24 hours) at 08/12/13 1527 Last data filed at 08/12/13 1359  Gross per 24 hour  Intake  752.5 ml  Output    925 ml  Net -172.5 ml    Last BM: 9/25   Labs:   Recent Labs Lab 08/08/13 0507 08/11/13 0522 08/12/13 0439  NA 136 134* 137  K 4.1 3.3*  3.2*  CL 98 99 103  CO2 27 24 24   BUN 21 6 4*  CREATININE 2.22* 1.21* 1.06  CALCIUM 9.8 8.8 8.9  GLUCOSE 104* 174* 97    CBG (last 3)  No results found for this basename: GLUCAP,  in the last 72 hours  Scheduled Meds: . amLODipine  10 mg Oral Daily  . bethanechol  25 mg Oral TID  . cefTRIAXone (ROCEPHIN)  IV  1 g Intravenous Q24H  . colchicine  0.6 mg Oral BID  . ondansetron  4 mg Oral TID AC  . pantoprazole  40 mg Oral BID AC  . potassium chloride  40 mEq Oral BID  . tamsulosin  0.4 mg Oral Daily    Continuous Infusions: . sodium chloride 75 mL/hr at 08/12/13 1048    Pricilla Holm RD, LDN Pager [phone] After Hours pager [phone]

## 2013-08-12 NOTE — Progress Notes (Signed)
Physical Therapy Treatment Patient Details Name: Colleen Nunez MRN: [id number] DOB: 11-23-1955 Today's Date: 08/12/2013 Time: [id number] PT Time Calculation (min): 32 min  PT Assessment / Plan / Recommendation  History of Present Illness     PT Comments   Pt completed whole session with supervision with min cueing for safety with hand and foot placement with sit to stand and stand to sits.  Pt completed bed exercises for LE strengthening without difficulty.  Pt ambulated 10 feet with RW and supervision, slow and labored with no LOB episodes noted.  Pt left in chair with call bell within reach.    Follow Up Recommendations        Does the patient have the potential to tolerate intense rehabilitation     Barriers to Discharge        Equipment Recommendations       Recommendations for Other Services    Frequency     Progress towards PT Goals Progress towards PT goals: Progressing toward goals  Plan      Precautions / Restrictions Precautions Precautions: Fall Restrictions Weight Bearing Restrictions: No    Mobility  Bed Mobility Supine to Sit: 5: Supervision Transfers Transfers: Sit to Stand;Stand to Sit Sit to Stand: 5: Supervision;With upper extremity assist Stand to Sit: 5: Supervision;With upper extremity assist Ambulation/Gait Ambulation/Gait Assistance: 5: Supervision Ambulation Distance (Feet): 10 Feet Assistive device: Rolling walker Ambulation/Gait Assistance Details: pt instructed in correct gait pattern to off load weight on LLE, also correct position of walker Gait Pattern: Antalgic;Trunk flexed;Decreased stance time - left;Decreased step length - right Gait velocity: very slow and labored Stairs: No Wheelchair Mobility Wheelchair Mobility: No    Exercises General Exercises - Lower Extremity Ankle Circles/Pumps: AROM;10 reps;Supine Quad Sets: AROM;Both;Supine Long Arc Quad: AROM;Both;10 reps;Seated Heel Slides: Both;AAROM;10 reps   PT Diagnosis:     PT Problem List:   PT Treatment Interventions:     PT Goals (current goals can now be found in the care plan section)    Visit Information  Last PT Received On: 08/12/13    Subjective Data   Ptstated she has been completeing the bed exercises all night long and has been sitting up on EOB to eat breakfast.  Reported pain scale 3/10 Lt knee today.     Cognition  Cognition Arousal/Alertness: Awake/alert Behavior During Therapy: WFL for tasks assessed/performed Overall Cognitive Status: Within Functional Limits for tasks assessed    Balance     End of Session PT - End of Session Equipment Utilized During Treatment: Gait belt Activity Tolerance: Patient tolerated treatment well;Patient limited by fatigue Patient left: in chair;with call bell/phone within reach Nurse Communication: Mobility status   GP     Colleen Nunez 08/12/2013, 9:58 AM

## 2013-08-12 NOTE — Anesthesia Preprocedure Evaluation (Signed)
Anesthesia Evaluation  Patient identified by MRN, date of birth, ID band Patient awake    Reviewed: Allergy & Precautions, H&P , NPO status , Patient's Chart, lab work & pertinent test results  Airway Mallampati: II TM Distance: >3 FB Neck ROM: Full    Dental  (+) Teeth Intact and Dental Advisory Given   Pulmonary  breath sounds clear to auscultation        Cardiovascular hypertension, Pt. on medications Rhythm:Regular Rate:Normal     Neuro/Psych    GI/Hepatic   Endo/Other    Renal/GU      Musculoskeletal   Abdominal   Peds  Hematology   Anesthesia Other Findings   Reproductive/Obstetrics                           Anesthesia Physical Anesthesia Plan  ASA: II and emergent  Anesthesia Plan:    Post-op Pain Management:    Induction: Intravenous  Airway Management Planned: Oral ETT  Additional Equipment:   Intra-op Plan:   Post-operative Plan: Extubation in OR  Informed Consent: I have reviewed the patients History and Physical, chart, labs and discussed the procedure including the risks, benefits and alternatives for the proposed anesthesia with the patient or authorized representative who has indicated his/her understanding and acceptance.   Dental advisory given  Plan Discussed with: CRNA, Anesthesiologist and Surgeon  Anesthesia Plan Comments:         Anesthesia Quick Evaluation

## 2013-08-12 NOTE — Progress Notes (Signed)
Patient transferred to [facility] 5 west bed 21,report called and given to Virgilio Frees RN.Vital signs stable,no c/o pain or discomfort.Transported via carelink to awaiting floor.Family aware of transfer.

## 2013-08-12 NOTE — Progress Notes (Signed)
Subjective:  Patient denies BM yesterday. Still "spitting up" when she tries to eat. Could not get applesauce with crush pills down this morning. No abdominal pain. Afebrile last 24 hours.   Objective: Vital signs in last 24 hours: Temp:  [97.8 F (36.6 C)-98.7 F (37.1 C)] 98.7 F (37.1 C) (09/26 0454) Pulse Rate:  [84-102] 86 (09/26 0454) Resp:  [18] 18 (09/26 0454) BP: (118-154)/(80-97) 154/87 mmHg (09/26 0454) SpO2:  [97 %-100 %] 100 % (09/26 0454) Last BM Date: 08/11/13 General:   Alert,  Well-developed, well-nourished, pleasant and cooperative in NAD Head:  Normocephalic and atraumatic. Eyes:  Sclera clear, no icterus.   Abdomen:  Soft, nontender and nondistended.     Extremities:  Without clubbing, deformity or edema. Neurologic:  Alert and  oriented x4;  grossly normal neurologically. Skin:  Intact without significant lesions or rashes. Psych:  Alert and cooperative. Normal mood and affect.  Intake/Output from previous day: 09/25 0701 - 09/26 0700 In: 1757.5 [P.O.:720; Blood:837.5; IV Piggyback:200] Out: 750 [Urine:750] Intake/Output this shift:    Lab Results: CBC  Recent Labs  08/11/13 0522 08/12/13 0439  WBC 9.4 6.6  HGB 6.8* 9.5*  HCT 20.9* 28.4*  MCV 81.3 79.8  PLT 268 281   BMET  Recent Labs  08/11/13 0522 08/12/13 0439  NA 134* 137  K 3.3* 3.2*  CL 99 103  CO2 24 24  GLUCOSE 174* 97  BUN 6 4*  CREATININE 1.21* 1.06  CALCIUM 8.8 8.9   Lab Results  Component Value Date   ALT 8 08/07/2013   AST 25 08/07/2013   ALKPHOS 51 08/07/2013   BILITOT 0.6 08/07/2013  Albumin 2.8.  Lab Results  Component Value Date   IRON <10* 08/11/2013   TIBC Not calculated due to Iron <10. 08/11/2013   FERRITIN 204 08/11/2013   Lab Results  Component Value Date   [id number] 08/11/2013   Lab Results  Component Value Date   FOLATE 4.2 08/11/2013     Imaging Studies: Ct Abdomen Pelvis Wo Contrast  08/11/2013   CLINICAL DATA:  Abdomen pain. Unexplained  anemia. Constipation  EXAM: CT ABDOMEN AND PELVIS WITHOUT CONTRAST  TECHNIQUE: Multidetector CT imaging of the abdomen and pelvis was performed following the standard protocol without intravenous contrast. Oral contrast is administered.  COMPARISON:  June 04, 2005  FINDINGS: There is focal fatty infiltration or liver near the falciform ligament. The liver is otherwise normal. The spleen, pancreas, gallbladder, adrenal glands are normal. The left kidney is atrophic. There is mild right perinephric stranding. There is no right hydronephrosis or right renal stones. Small right parapelvic renal cyst is unchanged. The aorta is normal. There is no abdominal lymphadenopathy. There is no small bowel obstruction or diverticulitis. There is mild thickening of of colonic wall in the transverse colon probably due to under distension. The appendix is not seen but no inflammation is noted around the cecum.  The bladder is partial decompressed with mild diffuse bladder wall thickening nonspecific, probably due to under distension. Pelvic phleboliths are identified. The lung bases are clear. Degenerative joint changes of the spine are identified.  IMPRESSION: Mild right perinephric stranding without evidence of obstruction or renal stone. This is nonspecific but can be seen in pyelonephritis. Clinical correlation is recommended. No acute abnormalities identified in the pelvis.   Electronically Signed   By: Abelardo Diesel   On: 08/11/2013 13:00     Ct Soft Tissue Neck Wo Contrast  08/11/2013   CLINICAL DATA:  57 year old female with difficulty swallowing. Cervical spine surgery in July with subsequent swelling. Fluid collection.  EXAM: CT NECK WITHOUT CONTRAST  TECHNIQUE: Multidetector CT imaging of the neck was performed following the standard protocol without intravenous contrast.  COMPARISON:  Cervical spine MRI and CT 06/03/2013.  FINDINGS: Sequelae of cervical spine surgery from the C3 level to C7, including C4 and C5  corpectomies. Anterior cervical spine hardware present from the C3 to the C7 levels. Insert cervicothoracic Bilateral posterior element alignment is within normal limits. Insert skullbase  Very large trans spatial and mildly lobulated fluid collection tracks from the prevertebral space anteriorly and abuts the skin surface at the level of the left thyroid. This encompasses 8.6 x 7.3 x 11.6 cm (AP by transverse by CC). The fluid is simple by densitometry (7 Hounsfield units). There is associated mass effect on neck structures, including lateral displacement of the left carotid space, complete effacement of the cervical and upper thoracic esophagus as well as anterior displacement of the hypopharynx and airway.  No surrounding inflammatory stranding, including no inflammation in the superior mediastinum.  Negative visualized non contrast brain parenchyma, nasopharynx, superior parapharyngeal spaces, sublingual space, submandibular glands and parotid glands. Chronic left orbital floor fracture re- identified.  IMPRESSION: Very large prevertebral but trans-spatial fluid collection encompassing 8.6 x 7.3 x 11.6 cm. This tracks to the skin surface at the level of the left thyroid and is most compatible with a postoperative CSF leak.  Associated regional mass effect, including severe effacement of the hypopharynx and upper esophagus.  These results will be called to the ordering clinician or representative by the Radiologist Assistant, and communication documented in the PACS Dashboard.   Electronically Signed   By: Lars Pinks M.D.   On: 08/11/2013 17:14   Assessment: 57 y/o lady with h/o cervical disc surgery in late July 2014 with complaints of frequent vomiting, lower extremity weakness. Significant drop in H/H since admission in the setting of malnutrition related to difficulties swallowing. Some brbpr with passage of hard stool this admission. No melena. CT neck last night showed large fluid collection causing  regional mass effect including hypopharynx and upper esophagus, likely due to postoperative CSF leak. This easily explains her dysphagia, "vomiting" which at this time is her major issue.  Anemia likely multifactorial. Iron is <10. In part due to malnutrition with inability to swallowing much in the last two months. She will need colonoscopy and upper endoscopy at a later date. H/H up post-transfusion (3 units). No BM in over 24 hours. I don't suspect significant GI bleeding at this time.  CT A/P ?right pyelonephritis. On Rocephin for Klebsiella Pneumoniae.   HCV Ab+: HCV RNA pending.  Plan: 1. No immediate GI intervention needed but she will need colonoscopy and possible EGD in near future. 2. Monitor H/H. 3. F/U pending HCV RNA. 4. Management of postoperative CSF leak per attending. Likely will need transfer for management as patient is unable to eat/swallow appropriately.  5. Dr. Laural Golden providing coverage for [facility] today.  Discussed with Dr. Legrand Rams. Awaiting neurosurgeon input.     LOS: 5 days   Neil Crouch  08/12/2013, 7:59 AM  Discussed with Dr. Laural Golden. He suggests temporary NGT placement by radiology to provide means of giving medications and nutrition. Management of fluid collection is primary concern at this time. I reviewed CT with radiologist, Dr. Misty Stanley. Esophagus is tightly compressed from origin to distal esophagus. I did not think NGT placement is possible.  Patient has been transferred  to [facility]

## 2013-08-13 LAB — MRSA PCR SCREENING: MRSA by PCR: NEGATIVE

## 2013-08-13 LAB — BASIC METABOLIC PANEL
CO2: 24 mEq/L (ref 19–32)
Calcium: 8.6 mg/dL (ref 8.4–10.5)
Chloride: 100 mEq/L (ref 96–112)
GFR calc non Af Amer: 61 mL/min — ABNORMAL LOW (ref >90)
Sodium: 136 mEq/L (ref 135–145)

## 2013-08-13 LAB — CBC
MCH: 26.9 pg (ref 26.0–34.0)
MCHC: 34 g/dL (ref 30.0–36.0)
Platelets: 329 10*3/uL (ref 150–400)
RBC: 4.16 MIL/uL (ref 3.87–5.11)

## 2013-08-13 MED ORDER — ONDANSETRON HCL 4 MG/2ML IJ SOLN
4.0000 mg | Freq: Once | INTRAMUSCULAR | Status: AC | PRN
  Administered 2013-08-13: 4 mg via INTRAVENOUS

## 2013-08-13 MED ORDER — ONDANSETRON HCL 4 MG/2ML IJ SOLN
INTRAMUSCULAR | Status: AC
  Filled 2013-08-13: qty 2

## 2013-08-13 MED ORDER — HYDROMORPHONE HCL PF 1 MG/ML IJ SOLN
INTRAMUSCULAR | Status: AC
  Filled 2013-08-13: qty 1

## 2013-08-13 MED ORDER — OXYCODONE HCL 5 MG/5ML PO SOLN: 5.0000 mg | Freq: Once | ORAL | Status: DC | PRN

## 2013-08-13 MED ORDER — OXYCODONE HCL 5 MG PO TABS
5.0000 mg | ORAL_TABLET | Freq: Once | ORAL | Status: DC | PRN
Start: 2013-08-13 — End: 2013-08-13

## 2013-08-13 MED ORDER — HYDROMORPHONE HCL PF 1 MG/ML IJ SOLN
0.2500 mg | INTRAMUSCULAR | Status: DC | PRN
  Administered 2013-08-13 (×4): 0.5 mg via INTRAVENOUS

## 2013-08-13 NOTE — Progress Notes (Signed)
Patient ID: Colleen Nunez, female   DOB: 05-Sep-1956, 57 y.o.   MRN: [id number] Patient hah evacuation of the csf collection in the cervical area f/u with a lumbar drain for csf drainage.

## 2013-08-13 NOTE — Progress Notes (Signed)
PT Cancellation/Discharge Note  Patient Details Name: Colleen Nunez MRN: [id number] DOB: 06/06/1956   Cancelled Treatment:    Reason Eval/Treat Not Completed: Patient at procedure or test/unavailable;Medical issues which prohibited therapy.  Patient to OR last pm for exploration of neck.  Will discontinue PT at this time. **MD:  Please reorder PT when appropriate for patient post-op.  Thank you!   Despina Pole 08/13/2013, 7:21 AM Carita Pian. Sanjuana Kava, [place] Pager [phone]

## 2013-08-13 NOTE — Progress Notes (Signed)
Patient ID: Colleen Nunez, female   DOB: July 03, 1956, 57 y.o.   MRN: [id number] Neck wound flat. Lumbar catheter revised and its working well. Can be with hob up to45 -90 degrees

## 2013-08-13 NOTE — Anesthesia Postprocedure Evaluation (Signed)
  Anesthesia Post-op Note  Patient: Colleen Nunez  Procedure(s) Performed: Procedure(s): Repair of Anterior  Cervical CSF LEAK, lumbar drain placement. (N/A)  Patient Location: PACU  Anesthesia Type:General  Level of Consciousness: awake, alert  and oriented  Airway and Oxygen Therapy: Patient Spontanous Breathing and Patient connected to nasal cannula oxygen  Post-op Pain: mild  Post-op Assessment: Post-op Vital signs reviewed  Post-op Vital Signs: Reviewed  Complications: No apparent anesthesia complications

## 2013-08-13 NOTE — Progress Notes (Signed)
[facility]                                                                                Patient Demographics  Colleen Nunez, is a 57 y.o. female, DOB - [birth year], [id number]  Admit date - 08/07/2013   Admitting Physician Rosita Fire, MD  Outpatient Primary MD for the patient is [facility], [place]  LOS - 6   Chief Complaint  Patient presents with  . Leg Pain  . Nausea        Assessment & Plan    1. Severe dysphagia due to large CSF leak and paraspinal collection causing is a fascial compression from a recent C-spine surgery done a few weeks ago - she status post CSF collection drainage by neurosurgery on 08/12/2013, her left-sided neck swelling is much improved, patient is feeling much better, she is able to swallow her saliva without much difficulty and able to maintain her airway. Discussed with neurosurgery cleared for diet, only sitting up in the bed for now as she has a drain in her C-spine.    2. Recent diagnosis of pyelonephritis. Continue Rocephin for now complete total 10 days.     3. History of hypertension. Blood pressure is on the softer side. Gentle hydration hold Norvasc.     4. History of gout stable on colchicine.      Code Status: Full  Family Communication: None present  Disposition Plan: To be decided   Procedures C-spine CSF collection drained by neurosurgery on 08/12/2013   Consults  neurosurgery   DVT Prophylaxis   SCDs    Lab Results  Component Value Date   PLT 329 08/13/2013    Medications  Scheduled Meds: . amLODipine  10 mg Oral Daily  . bethanechol  25 mg Oral TID  . cefTRIAXone (ROCEPHIN)  IV  1 g Intravenous Q24H  . colchicine  0.6 mg Oral BID  . HYDROmorphone      . HYDROmorphone      . ondansetron      . ondansetron  4 mg Oral TID AC  . pantoprazole  40 mg Oral BID AC  . potassium chloride  40 mEq Oral BID  . tamsulosin  0.4 mg Oral Daily   Continuous Infusions: . sodium chloride 1,000 mL  (08/13/13 0212)   PRN Meds:.acetaminophen, acetaminophen, ondansetron (ZOFRAN) IV, ondansetron, oxyCODONE-acetaminophen  Antibiotics    Anti-infectives   Start     Dose/Rate Route Frequency Ordered Stop   08/08/13 1600  cefTRIAXone (ROCEPHIN) 1 g in dextrose 5 % 50 mL IVPB     1 g 100 mL/hr over 30 Minutes Intravenous Every 24 hours 08/07/13 1829     08/07/13 1630  cefTRIAXone (ROCEPHIN) 1 g in dextrose 5 % 50 mL IVPB     1 g Intravenous  Once 08/07/13 1616 08/07/13 1712       Time Spent in minutes   35   Yashua Bracco K M.D on 08/13/2013 at 9:23 AM  Between 7am to 7pm - Pager - [phone]  After 7pm go to [email or web address] - password [email or web address]  And look for the night coverage person covering for me after hours  [facility] Office  [phone]  Subjective:   Orson Aloe today has, No headache, No chest pain, No abdominal pain - No Nausea, No new weakness tingling or numbness, No Cough - SOB. Feels much better.  Objective:   Filed Vitals:   08/13/13 0600 08/13/13 0700 08/13/13 0800 08/13/13 0900  BP: 115/78 129/109 97/67 127/101  Pulse: 65 88 73 97  Temp:   96 F (35.6 C)   TempSrc:   Axillary   Resp: 9 13 11 12   Height:      Weight:      SpO2: 96% 99% 99% 100%    Wt Readings from Last 3 Encounters:  08/13/13 74.8 kg (164 lb 14.5 oz)  08/13/13 74.8 kg (164 lb 14.5 oz)  06/10/13 86.5 kg (190 lb 11.2 oz)     Intake/Output Summary (Last 24 hours) at 08/13/13 0923 Last data filed at 08/13/13 0900  Gross per 24 hour  Intake   1300 ml  Output    530 ml  Net    770 ml    Exam Awake Alert, Oriented X 3, No new F.N deficits, Normal affect [place].AT,PERRAL Supple Neck,No JVD, No cervical lymphadenopathy appriciated. Left neck postsurgical scar and drain stable Symmetrical Chest wall movement, Good air movement bilaterally, CTAB RRR,No Gallops,Rubs or new Murmurs, No Parasternal Heave +ve B.Sounds, Abd Soft, Non tender, No organomegaly appriciated, No  rebound - guarding or rigidity. No Cyanosis, Clubbing or edema, No new Rash or bruise      Data Review   Micro Results Recent Results (from the past 240 hour(s))  URINE CULTURE     Status: None   Collection Time    08/07/13  2:10 PM      Result Value Range Status   Specimen Description URINE, CATHETERIZED   Final   Special Requests NONE   Final   Culture  Setup Time     Final   Value: 08/08/2013 00:11     Performed at [facility]     Final   Value: >=100,000 COLONIES/ML     Performed at [facility]   Culture     Final   Value: KLEBSIELLA PNEUMONIAE     Performed at [facility]   Report Status 08/09/2013 FINAL   Final   Organism ID, Bacteria KLEBSIELLA PNEUMONIAE   Final  CULTURE, BLOOD (ROUTINE X 2)     Status: None   Collection Time    08/09/13  3:37 PM      Result Value Range Status   Specimen Description BLOOD RIGHT HAND   Final   Special Requests     Final   Value: BOTTLES DRAWN AEROBIC AND ANAEROBIC AEB=15CC ANA=12CC   Culture NO GROWTH 3 DAYS   Final   Report Status PENDING   Incomplete  CULTURE, BLOOD (ROUTINE X 2)     Status: None   Collection Time    08/09/13  3:40 PM      Result Value Range Status   Specimen Description BLOOD LEFT ANTECUBITAL   Final   Special Requests     Final   Value: BOTTLES DRAWN AEROBIC AND ANAEROBIC AEB=12CC ANA=8CC   Culture NO GROWTH 3 DAYS   Final   Report Status PENDING   Incomplete  MRSA PCR SCREENING     Status: None   Collection Time    08/13/13  2:05 AM      Result Value Range Status   MRSA by PCR NEGATIVE  NEGATIVE Final   Comment:  The GeneXpert MRSA Assay (FDA     approved for NASAL specimens     only), is one component of a     comprehensive MRSA colonization     surveillance program. It is not     intended to diagnose MRSA     infection nor to guide or     monitor treatment for     MRSA infections.    Radiology Reports Ct Abdomen Pelvis Wo  Contrast  08/11/2013   CLINICAL DATA:  Abdomen pain. Unexplained anemia. Constipation  EXAM: CT ABDOMEN AND PELVIS WITHOUT CONTRAST  TECHNIQUE: Multidetector CT imaging of the abdomen and pelvis was performed following the standard protocol without intravenous contrast. Oral contrast is administered.  COMPARISON:  June 04, 2005  FINDINGS: There is focal fatty infiltration or liver near the falciform ligament. The liver is otherwise normal. The spleen, pancreas, gallbladder, adrenal glands are normal. The left kidney is atrophic. There is mild right perinephric stranding. There is no right hydronephrosis or right renal stones. Small right parapelvic renal cyst is unchanged. The aorta is normal. There is no abdominal lymphadenopathy. There is no small bowel obstruction or diverticulitis. There is mild thickening of of colonic wall in the transverse colon probably due to under distension. The appendix is not seen but no inflammation is noted around the cecum.  The bladder is partial decompressed with mild diffuse bladder wall thickening nonspecific, probably due to under distension. Pelvic phleboliths are identified. The lung bases are clear. Degenerative joint changes of the spine are identified.  IMPRESSION: Mild right perinephric stranding without evidence of obstruction or renal stone. This is nonspecific but can be seen in pyelonephritis. Clinical correlation is recommended. No acute abnormalities identified in the pelvis.   Electronically Signed   By: Abelardo Diesel   On: 08/11/2013 13:00      Ct Soft Tissue Neck Wo Contrast  08/11/2013   CLINICAL DATA:  57 year old female with difficulty swallowing. Cervical spine surgery in July with subsequent swelling. Fluid collection.  EXAM: CT NECK WITHOUT CONTRAST  TECHNIQUE: Multidetector CT imaging of the neck was performed following the standard protocol without intravenous contrast.  COMPARISON:  Cervical spine MRI and CT 06/03/2013.  FINDINGS: Sequelae of  cervical spine surgery from the C3 level to C7, including C4 and C5 corpectomies. Anterior cervical spine hardware present from the C3 to the C7 levels. Insert cervicothoracic Bilateral posterior element alignment is within normal limits. Insert skullbase  Very large trans spatial and mildly lobulated fluid collection tracks from the prevertebral space anteriorly and abuts the skin surface at the level of the left thyroid. This encompasses 8.6 x 7.3 x 11.6 cm (AP by transverse by CC). The fluid is simple by densitometry (7 Hounsfield units). There is associated mass effect on neck structures, including lateral displacement of the left carotid space, complete effacement of the cervical and upper thoracic esophagus as well as anterior displacement of the hypopharynx and airway.  No surrounding inflammatory stranding, including no inflammation in the superior mediastinum.  Negative visualized non contrast brain parenchyma, nasopharynx, superior parapharyngeal spaces, sublingual space, submandibular glands and parotid glands. Chronic left orbital floor fracture re- identified.  IMPRESSION: Very large prevertebral but trans-spatial fluid collection encompassing 8.6 x 7.3 x 11.6 cm. This tracks to the skin surface at the level of the left thyroid and is most compatible with a postoperative CSF leak.  Associated regional mass effect, including severe effacement of the hypopharynx and upper esophagus.  These results will be called  to the ordering clinician or representative by the Radiologist Assistant, and communication documented in the PACS Dashboard.   Electronically Signed   By: Lars Pinks M.D.   On: 08/11/2013 17:14   Dg Chest Port 1 View  08/07/2013   CLINICAL DATA:  Nausea  EXAM: PORTABLE CHEST - 1 VIEW  COMPARISON:  June 09, 2013  FINDINGS: There is soft tissue fullness in the paratracheal regions bilaterally which was not present on prior study. Elsewhere, lungs are clear. Heart size and pulmonary vascularity  are normal. There is no adenopathy beyond potential adenopathy in the peritracheal regions. No pneumothorax. There is arthropathy in both shoulders. There is postoperative change in the cervical spine inferiorly.  IMPRESSION: Soft tissue fullness in the paratracheal regions bilaterally. Etiology for this soft tissue fullness is uncertain. Given this finding, upright PA and lateral chest radiographs advised to further evaluate. If soft tissue opacity persists in these regions of the following upright PA and lateral chest imaging, contrast enhanced chest CT would be advised to further assess.  Elsewhere lungs appear clear.   Electronically Signed   By: Lowella Grip   On: 08/07/2013 14:39       CBC  Recent Labs Lab 08/07/13 1402 08/08/13 0507 08/11/13 0522 08/12/13 0439 08/13/13 0410  WBC 11.1* 10.1 9.4 6.6 6.1  HGB 8.9* 8.4* 6.8* 9.5* 11.2*  HCT 27.6* 26.5* 20.9* 28.4* 32.9*  PLT 336 300 268 281 329  MCV 81.9 82.6 81.3 79.8 79.1  MCH 26.4 26.2 26.5 26.7 26.9  MCHC 32.2 31.7 32.5 33.5 34.0  RDW 13.7 13.8 13.7 14.4 14.7  LYMPHSABS 2.7  --   --  2.4  --   MONOABS 1.1*  --   --  0.6  --   EOSABS 0.0  --   --  0.3  --   BASOSABS 0.0  --   --  0.0  --     Chemistries   Recent Labs Lab 08/07/13 1402 08/08/13 0507 08/11/13 0522 08/12/13 0439 08/13/13 0410  NA 131* 136 134* 137 136  K 3.9 4.1 3.3* 3.2* 3.8  CL 93* 98 99 103 100  CO2 27 27 24 24 24   GLUCOSE 127* 104* 174* 97 136*  BUN 20 21 6  4* 4*  CREATININE 1.95* 2.22* 1.21* 1.06 1.01  CALCIUM 9.8 9.8 8.8 8.9 8.6  AST 25  --   --   --   --   ALT 8  --   --   --   --   ALKPHOS 51  --   --   --   --   BILITOT 0.6  --   --   --   --    ------------------------------------------------------------------------------------------------------------------ estimated creatinine clearance is 58.2 ml/min (by C-G formula based on Cr of  1.01). ------------------------------------------------------------------------------------------------------------------ No results found for this basename: HGBA1C,  in the last 72 hours ------------------------------------------------------------------------------------------------------------------ No results found for this basename: CHOL, HDL, LDLCALC, TRIG, CHOLHDL, LDLDIRECT,  in the last 72 hours ------------------------------------------------------------------------------------------------------------------ No results found for this basename: TSH, T4TOTAL, FREET3, T3FREE, THYROIDAB,  in the last 72 hours ------------------------------------------------------------------------------------------------------------------  Recent Labs  08/11/13 0817  VITAMINB12 823  FOLATE 4.2  FERRITIN 204  TIBC Not calculated due to Iron <10.  IRON <10*  RETICCTPCT 1.1    Coagulation profile No results found for this basename: INR, PROTIME,  in the last 168 hours  No results found for this basename: DDIMER,  in the last 72 hours  Cardiac Enzymes No results found  for this basename: CK, CKMB, TROPONINI, MYOGLOBIN,  in the last 168 hours ------------------------------------------------------------------------------------------------------------------ No components found with this basename: POCBNP,

## 2013-08-13 NOTE — Transfer of Care (Signed)
Immediate Anesthesia Transfer of Care Note  Patient: Colleen Nunez  Procedure(s) Performed: Procedure(s): Repair of Anterior  Cervical CSF LEAK, lumbar drain placement. (N/A)  Patient Location: PACU  Anesthesia Type:General  Level of Consciousness: alert , oriented and patient cooperative  Airway & Oxygen Therapy: Patient Spontanous Breathing and Patient connected to nasal cannula oxygen  Post-op Assessment: Report given to PACU RN, Post -op Vital signs reviewed and stable and Patient moving all extremities X 4  Post vital signs: Reviewed and stable  Complications: No apparent anesthesia complications

## 2013-08-13 NOTE — OR Nursing (Signed)
Dr. Joya Salm completed the exploration of the neck to drain CSF at 2350.  Started lumbar drain placement at 0000.

## 2013-08-13 NOTE — Evaluation (Signed)
Clinical/Bedside Swallow Evaluation Patient Details  Name: Colleen Nunez MRN: [id number] Date of Birth: 1956-01-20  Today's Date: 08/13/2013 Time: [id number] SLP Time Calculation (min): 20 min  Past Medical History:  Past Medical History  Diagnosis Date  . Hypertension   . Arthritis   . Hepatitis C antibody test positive        . Renal insufficiency   . Anxiety   . Depression     h/o suicide attempts in the past  . GERD (gastroesophageal reflux disease)   . Polysubstance abuse     h/o  . Anemia   . Gout    Past Surgical History:  Past Surgical History  Procedure Laterality Date  . Knee arthroscopy    . Abdominal hysterectomy    . Cartilage removal      left knee  . Anterior cervical corpectomy N/A 06/09/2013    Procedure: C4 C5 Corpectomy with C6-7 Anterior cervical fusion/Peek cage/Trestle plate;  Surgeon: Colleen Connors, MD;  Location: [facility] [facility];  Service: Neurosurgery;  Laterality: N/A;  Cervical Four, Cervical Five Corpectomy and Cervical six-seven Anterior cervical fusion/Peek cage Three-Five /Trestle Plating Cervical Three to Cervical Seven  . X-stop implantation    . Bilateral soo and appendectomy  20046   HPI:  57 year old female who underwent corpectomy by dr Colleen Nunez back in July 2014. Since then she developed nausea and some vomiting. Also she noticed increase of the size of her neck with some difficulties to swallow. She was seen by dr Colleen Nunez in the office lastweek and she was told that she might require drainage. She was transferred from [facility] where  Neck showed an anterior scar with a large fluid collection with displacement of the trachea. Repeat CT neck showed a large fluid collection with associated regional mass effect, including severe effacement of the hypopharynx and upper esophagus. S/p lumbar drain and evacuate the cervical fluid collection 9/27 am.   Assessment / Plan / Recommendation Clinical Impression  Swallow evaluation complete.  Patient presents with improved function overall based on report of severe dysphagia including gagging, coughing, and globus with all consistencies prior to drainage of CSF leak. Patient now with what appears to be a functional oropharyngeal swallow without overt evidence of aspiration. Mild odynophagia noted likely due to recent procedure. Education complete with patient regarding general safe swallowing strategies and aspiration precautions. Suspect that when ok with MD, patient will be able to advance solids slowly without difficulty. SLP will f/u at bedside to monitor.     Aspiration Risk  Mild    Diet Recommendation Dysphagia 3 (Mechanical Soft);Thin liquid (advance per MD discretion)   Liquid Administration via: Cup;Straw Medication Administration: Whole meds with liquid (one at a time) Supervision: Patient able to self feed;Intermittent supervision to cue for compensatory strategies Compensations: Slow rate;Small sips/bites Postural Changes and/or Swallow Maneuvers: Seated upright 90 degrees    Other  Recommendations Oral Care Recommendations: Oral care BID   Follow Up Recommendations  None    Frequency and Duration min 2x/week  1 week   Pertinent Vitals/Pain none    SLP Swallow Goals Patient will utilize recommended strategies during swallow to increase swallowing safety with: Independent assistance Swallow Study Goal #2 - Progress: Other (comment) (new goal)   Swallow Study    General HPI: 57 year old female who underwent corpectomy by dr Colleen Nunez back in July 2014. Since then she developed nausea and some vomiting. Also she noticed increase of the size of her neck  with some difficulties to swallow. She was seen by dr Colleen Nunez in the office lastweek and she was told that she might require drainage. She was transferred from [facility] where  Neck showed an anterior scar with a large fluid collection with displacement of the trachea. Repeat CT neck showed a large fluid  collection with associated regional mass effect, including severe effacement of the hypopharynx and upper esophagus. S/p lumbar drain and evacuate the cervical fluid collection 9/27 am. Type of Study: Bedside swallow evaluation Previous Swallow Assessment: none Diet Prior to this Study: Thin liquids (clear liquids) Temperature Spikes Noted: No Respiratory Status: Room air History of Recent Intubation: No Behavior/Cognition: Alert;Cooperative;Pleasant mood Oral Cavity - Dentition: Adequate natural dentition Self-Feeding Abilities: Able to feed self Patient Positioning: Upright in bed Baseline Vocal Quality: Clear Volitional Cough: Strong Volitional Swallow: Able to elicit    Oral/Motor/Sensory Function Overall Oral Motor/Sensory Function: Appears within functional limits for tasks assessed   Ice Chips Ice chips: Not tested   Thin Liquid Thin Liquid: Within functional limits (except c/o mild pain with swallow) Presentation: Cup;Self Fed;Straw    Nectar Thick Nectar Thick Liquid: Not tested   Honey Thick Honey Thick Liquid: Not tested   Puree Puree: Within functional limits (except c/o mild pain with swallow) Presentation: Self Fed;Spoon   Solid   GO   Colleen Glodowski MA, CCC-SLP [phone]  Solid: Not tested (given current restrictions)       Colleen Nunez Colleen Nunez 08/13/2013,12:27 PM

## 2013-08-14 ENCOUNTER — Inpatient Hospital Stay (HOSPITAL_COMMUNITY): Payer: Medicaid Other

## 2013-08-14 LAB — CBC
HCT: 31.9 % — ABNORMAL LOW (ref 36.0–46.0)
MCHC: 34.2 g/dL (ref 30.0–36.0)
Platelets: 318 10*3/uL (ref 150–400)
RBC: 4.06 MIL/uL (ref 3.87–5.11)
RDW: 14.4 % (ref 11.5–15.5)
WBC: 12.5 10*3/uL — ABNORMAL HIGH (ref 4.0–10.5)

## 2013-08-14 LAB — BASIC METABOLIC PANEL
BUN: 7 mg/dL (ref 6–23)
Chloride: 104 mEq/L (ref 96–112)
GFR calc Af Amer: 78 mL/min — ABNORMAL LOW (ref >90)
Potassium: 3.5 mEq/L (ref 3.5–5.1)
Sodium: 137 mEq/L (ref 135–145)

## 2013-08-14 MED ORDER — MORPHINE SULFATE 2 MG/ML IJ SOLN
2.0000 mg | INTRAMUSCULAR | Status: DC | PRN
  Administered 2013-08-14 – 2013-08-18 (×3): 2 mg via INTRAVENOUS
  Filled 2013-08-14 (×3): qty 1

## 2013-08-14 MED ORDER — BISACODYL 5 MG PO TBEC
10.0000 mg | DELAYED_RELEASE_TABLET | Freq: Every day | ORAL | Status: DC
  Administered 2013-08-14 – 2013-08-22 (×2): 10 mg via ORAL
  Filled 2013-08-14 (×9): qty 2

## 2013-08-14 MED ORDER — MORPHINE SULFATE 2 MG/ML IJ SOLN
INTRAMUSCULAR | Status: AC
  Filled 2013-08-14: qty 1

## 2013-08-14 MED ORDER — POLYETHYLENE GLYCOL 3350 17 G PO PACK
17.0000 g | PACK | Freq: Every day | ORAL | Status: DC
  Administered 2013-08-14 – 2013-08-22 (×2): 17 g via ORAL
  Filled 2013-08-14 (×9): qty 1

## 2013-08-14 NOTE — Progress Notes (Signed)
[facility]                                                                                Patient Demographics  Colleen Nunez, is a 57 y.o. female, DOB - [birth year], [id number]  Admit date - 08/07/2013   Admitting Physician Rosita Fire, MD  Outpatient Primary MD for the patient is FANTA,TESFAYE, MD  LOS - 7   Chief Complaint  Patient presents with  . Leg Pain  . Nausea      Brief Summary   57 year old African American female who initially was admitted to [facility] for dysphasia, patient recently had a C-spine surgery prior to this admission, at any pain she was seen by GI and underwent weight stress for her dysphagia finally a CT scan of the Neck revealed a large CSF leak from her recent C-spine surgery compressing her esophagus causing her dysphagia. She was then transferred to [facility] on day 5 of her hospital stay, she was seen by neurosurgery and underwent CSF leak decompression on 08/12/2013 by Dr. Benetta Spar.   I took over the patient's care on 08/13/2013 On day 6 of hospital stay.     Assessment & Plan    1. Severe dysphagia due to large CSF leak and paraspinal collection causing is a fascial compression from a recent C-spine surgery done a few weeks ago - she status post CSF collection drainage by neurosurgery on 08/12/2013, her left-sided neck swelling is much improved, patient is feeling much better, she is able to swallow her saliva without much difficulty and able to maintain her airway. Discussed with neurosurgery cleared for diet, only sitting up in the bed for now as she has a drain in her spine.     2. Recent diagnosis of pyelonephritis. Continue Rocephin for now complete total 10 days.      3. History of hypertension. Blood pressure is on the softer side. Gentle hydration hold Norvasc.     4. History of gout stable on colchicine.      5.Mild leukocytosis postop on 08/14/2013. Has mild cough, could be secondary to  atelectasis versus pneumonia. Will add incentive spirometer, obtain chest x-ray, monitor clinically. Currently afebrile. Appears nontoxic.      Code Status: Full  Family Communication: None present  Disposition Plan: To be decided   Procedures C-spine CSF collection drained by neurosurgery on 08/12/2013 (Dr Joya Salm)   Consults  neurosurgery   DVT Prophylaxis   SCDs    Lab Results  Component Value Date   PLT 318 08/14/2013    Medications  Scheduled Meds: . bethanechol  25 mg Oral TID  . cefTRIAXone (ROCEPHIN)  IV  1 g Intravenous Q24H  . colchicine  0.6 mg Oral BID  . ondansetron  4 mg Oral TID AC  . pantoprazole  40 mg Oral BID AC  . tamsulosin  0.4 mg Oral Daily   Continuous Infusions: . sodium chloride 75 mL/hr at 08/14/13 0600   PRN Meds:.acetaminophen, acetaminophen, ondansetron (ZOFRAN) IV, ondansetron, oxyCODONE-acetaminophen  Antibiotics    Anti-infectives   Start     Dose/Rate Route Frequency Ordered Stop   08/08/13 1600  cefTRIAXone (ROCEPHIN) 1 g in dextrose  5 % 50 mL IVPB     1 g 100 mL/hr over 30 Minutes Intravenous Every 24 hours 08/07/13 1829     08/07/13 1630  cefTRIAXone (ROCEPHIN) 1 g in dextrose 5 % 50 mL IVPB     1 g Intravenous  Once 08/07/13 1616 08/07/13 1712       Time Spent in minutes   35   [name] K M.D on 08/14/2013 at 7:25 AM  Between 7am to 7pm - Pager - [phone]  After 7pm go to [email or web address] - password [email or web address]  And look for the night coverage person covering for me after hours  [facility] Office  [phone]    Subjective:   Colleen Nunez today has, No headache, No chest pain, No abdominal pain - No Nausea, No new weakness tingling or numbness, No Cough - SOB. Feels much better.  Objective:   Filed Vitals:   08/14/13 0300 08/14/13 0400 08/14/13 0500 08/14/13 0600  BP: 122/68 125/98 128/70 131/78  Pulse: 48 59 54 55  Temp: 97.3 F (36.3 C)     TempSrc: Oral     Resp: 12 15 13 15    Height:      Weight:      SpO2: 100% 100% 100% 100%    Wt Readings from Last 3 Encounters:  08/13/13 74.8 kg (164 lb 14.5 oz)  08/13/13 74.8 kg (164 lb 14.5 oz)  06/10/13 86.5 kg (190 lb 11.2 oz)     Intake/Output Summary (Last 24 hours) at 08/14/13 0725 Last data filed at 08/14/13 0600  Gross per 24 hour  Intake   1825 ml  Output   2923 ml  Net  -1098 ml    Exam Awake Alert, Oriented X 3, No new F.N deficits, Normal affect [place].AT,PERRAL Supple Neck,No JVD, No cervical lymphadenopathy appriciated. Left neck postsurgical scar and drain stable Symmetrical Chest wall movement, Good air movement bilaterally, CTAB RRR,No Gallops,Rubs or new Murmurs, No Parasternal Heave +ve B.Sounds, Abd Soft, Non tender, No organomegaly appriciated, No rebound - guarding or rigidity. No Cyanosis, Clubbing or edema, No new Rash or bruise      Data Review   Micro Results Recent Results (from the past 240 hour(s))  URINE CULTURE     Status: None   Collection Time    08/07/13  2:10 PM      Result Value Range Status   Specimen Description URINE, CATHETERIZED   Final   Special Requests NONE   Final   Culture  Setup Time     Final   Value: 08/08/2013 00:11     Performed at [place]     Final   Value: >=100,000 COLONIES/ML     Performed at [facility]   Culture     Final   Value: KLEBSIELLA PNEUMONIAE     Performed at [facility]   Report Status 08/09/2013 FINAL   Final   Organism ID, Bacteria KLEBSIELLA PNEUMONIAE   Final  CULTURE, BLOOD (ROUTINE X 2)     Status: None   Collection Time    08/09/13  3:37 PM      Result Value Range Status   Specimen Description BLOOD RIGHT HAND   Final   Special Requests     Final   Value: BOTTLES DRAWN AEROBIC AND ANAEROBIC AEB=15CC ANA=12CC   Culture NO GROWTH 4 DAYS   Final   Report Status PENDING   Incomplete  CULTURE, BLOOD (ROUTINE X 2)  Status: None   Collection Time    08/09/13  3:40 PM       Result Value Range Status   Specimen Description BLOOD LEFT ANTECUBITAL   Final   Special Requests     Final   Value: BOTTLES DRAWN AEROBIC AND ANAEROBIC AEB=12CC ANA=8CC   Culture NO GROWTH 4 DAYS   Final   Report Status PENDING   Incomplete  MRSA PCR SCREENING     Status: None   Collection Time    08/13/13  2:05 AM      Result Value Range Status   MRSA by PCR NEGATIVE  NEGATIVE Final   Comment:            The GeneXpert MRSA Assay (FDA     approved for NASAL specimens     only), is one component of a     comprehensive MRSA colonization     surveillance program. It is not     intended to diagnose MRSA     infection nor to guide or     monitor treatment for     MRSA infections.    Radiology Reports Ct Abdomen Pelvis Wo Contrast  08/11/2013   CLINICAL DATA:  Abdomen pain. Unexplained anemia. Constipation  EXAM: CT ABDOMEN AND PELVIS WITHOUT CONTRAST  TECHNIQUE: Multidetector CT imaging of the abdomen and pelvis was performed following the standard protocol without intravenous contrast. Oral contrast is administered.  COMPARISON:  June 04, 2005  FINDINGS: There is focal fatty infiltration or liver near the falciform ligament. The liver is otherwise normal. The spleen, pancreas, gallbladder, adrenal glands are normal. The left kidney is atrophic. There is mild right perinephric stranding. There is no right hydronephrosis or right renal stones. Small right parapelvic renal cyst is unchanged. The aorta is normal. There is no abdominal lymphadenopathy. There is no small bowel obstruction or diverticulitis. There is mild thickening of of colonic wall in the transverse colon probably due to under distension. The appendix is not seen but no inflammation is noted around the cecum.  The bladder is partial decompressed with mild diffuse bladder wall thickening nonspecific, probably due to under distension. Pelvic phleboliths are identified. The lung bases are clear. Degenerative joint changes of the  spine are identified.  IMPRESSION: Mild right perinephric stranding without evidence of obstruction or renal stone. This is nonspecific but can be seen in pyelonephritis. Clinical correlation is recommended. No acute abnormalities identified in the pelvis.   Electronically Signed   By: Abelardo Diesel   On: 08/11/2013 13:00      Ct Soft Tissue Neck Wo Contrast  08/11/2013   CLINICAL DATA:  57 year old female with difficulty swallowing. Cervical spine surgery in July with subsequent swelling. Fluid collection.  EXAM: CT NECK WITHOUT CONTRAST  TECHNIQUE: Multidetector CT imaging of the neck was performed following the standard protocol without intravenous contrast.  COMPARISON:  Cervical spine MRI and CT 06/03/2013.  FINDINGS: Sequelae of cervical spine surgery from the C3 level to C7, including C4 and C5 corpectomies. Anterior cervical spine hardware present from the C3 to the C7 levels. Insert cervicothoracic Bilateral posterior element alignment is within normal limits. Insert skullbase  Very large trans spatial and mildly lobulated fluid collection tracks from the prevertebral space anteriorly and abuts the skin surface at the level of the left thyroid. This encompasses 8.6 x 7.3 x 11.6 cm (AP by transverse by CC). The fluid is simple by densitometry (7 Hounsfield units). There is associated mass effect on neck structures,  including lateral displacement of the left carotid space, complete effacement of the cervical and upper thoracic esophagus as well as anterior displacement of the hypopharynx and airway.  No surrounding inflammatory stranding, including no inflammation in the superior mediastinum.  Negative visualized non contrast brain parenchyma, nasopharynx, superior parapharyngeal spaces, sublingual space, submandibular glands and parotid glands. Chronic left orbital floor fracture re- identified.  IMPRESSION: Very large prevertebral but trans-spatial fluid collection encompassing 8.6 x 7.3 x 11.6 cm.  This tracks to the skin surface at the level of the left thyroid and is most compatible with a postoperative CSF leak.  Associated regional mass effect, including severe effacement of the hypopharynx and upper esophagus.  These results will be called to the ordering clinician or representative by the Radiologist Assistant, and communication documented in the PACS Dashboard.   Electronically Signed   By: Lars Pinks M.D.   On: 08/11/2013 17:14   Dg Chest Port 1 View  08/07/2013   CLINICAL DATA:  Nausea  EXAM: PORTABLE CHEST - 1 VIEW  COMPARISON:  June 09, 2013  FINDINGS: There is soft tissue fullness in the paratracheal regions bilaterally which was not present on prior study. Elsewhere, lungs are clear. Heart size and pulmonary vascularity are normal. There is no adenopathy beyond potential adenopathy in the peritracheal regions. No pneumothorax. There is arthropathy in both shoulders. There is postoperative change in the cervical spine inferiorly.  IMPRESSION: Soft tissue fullness in the paratracheal regions bilaterally. Etiology for this soft tissue fullness is uncertain. Given this finding, upright PA and lateral chest radiographs advised to further evaluate. If soft tissue opacity persists in these regions of the following upright PA and lateral chest imaging, contrast enhanced chest CT would be advised to further assess.  Elsewhere lungs appear clear.   Electronically Signed   By: Lowella Grip   On: 08/07/2013 14:39       CBC  Recent Labs Lab 08/07/13 1402 08/08/13 0507 08/11/13 0522 08/12/13 0439 08/13/13 0410 08/14/13 0550  WBC 11.1* 10.1 9.4 6.6 6.1 12.5*  HGB 8.9* 8.4* 6.8* 9.5* 11.2* 10.9*  HCT 27.6* 26.5* 20.9* 28.4* 32.9* 31.9*  PLT 336 300 268 281 329 318  MCV 81.9 82.6 81.3 79.8 79.1 78.6  MCH 26.4 26.2 26.5 26.7 26.9 26.8  MCHC 32.2 31.7 32.5 33.5 34.0 34.2  RDW 13.7 13.8 13.7 14.4 14.7 14.4  LYMPHSABS 2.7  --   --  2.4  --   --   MONOABS 1.1*  --   --  0.6  --   --    EOSABS 0.0  --   --  0.3  --   --   BASOSABS 0.0  --   --  0.0  --   --     Chemistries   Recent Labs Lab 08/07/13 1402 08/08/13 0507 08/11/13 0522 08/12/13 0439 08/13/13 0410 08/14/13 0550  NA 131* 136 134* 137 136 137  K 3.9 4.1 3.3* 3.2* 3.8 3.5  CL 93* 98 99 103 100 104  CO2 27 27 24 24 24 22   GLUCOSE 127* 104* 174* 97 136* 132*  BUN 20 21 6  4* 4* 7  CREATININE 1.95* 2.22* 1.21* 1.06 1.01 0.93  CALCIUM 9.8 9.8 8.8 8.9 8.6 8.1*  AST 25  --   --   --   --   --   ALT 8  --   --   --   --   --   ALKPHOS 51  --   --   --   --   --  BILITOT 0.6  --   --   --   --   --    ------------------------------------------------------------------------------------------------------------------ estimated creatinine clearance is 63.2 ml/min (by C-G formula based on Cr of 0.93). ------------------------------------------------------------------------------------------------------------------ No results found for this basename: HGBA1C,  in the last 72 hours ------------------------------------------------------------------------------------------------------------------ No results found for this basename: CHOL, HDL, LDLCALC, TRIG, CHOLHDL, LDLDIRECT,  in the last 72 hours ------------------------------------------------------------------------------------------------------------------ No results found for this basename: TSH, T4TOTAL, FREET3, T3FREE, THYROIDAB,  in the last 72 hours ------------------------------------------------------------------------------------------------------------------  Recent Labs  08/11/13 0817  VITAMINB12 823  FOLATE 4.2  FERRITIN 204  TIBC Not calculated due to Iron <10.  IRON <10*  RETICCTPCT 1.1    Coagulation profile No results found for this basename: INR, PROTIME,  in the last 168 hours  No results found for this basename: DDIMER,  in the last 72 hours  Cardiac Enzymes No results found for this basename: CK, CKMB, TROPONINI, MYOGLOBIN,  in  the last 168 hours ------------------------------------------------------------------------------------------------------------------ No components found with this basename: POCBNP,

## 2013-08-14 NOTE — Op Note (Signed)
NAMEBERNITA, COOLIDGE NO.:  [id number]  MEDICAL RECORD NO.:  [id number]  LOCATION:  3M08C                        FACILITY:  [facility]  PHYSICIAN:  Leeroy Cha, M.D.   DATE OF BIRTH:  [birth year]  DATE OF PROCEDURE:  08/12/2013 DATE OF DISCHARGE:                              OPERATIVE REPORT   PREOPERATIVE DIAGNOSIS:  CSF leak with dysphagia status post corpectomy and fusion of the cervical spine in July 2014.  POSTOPERATIVE DIAGNOSIS:  CSF leak with dysphagia status post corpectomy and fusion of the cervical spine in July 2014.  PROCEDURE: 1. Evacuation of the large collection of fluid in the cervical area. 2. Insertion of lumbar drain for spinal fluid drainage.  SURGEON:  Leeroy Cha, M.D.  CLINICAL HISTORY:  The patient was admitted through [facility], after she was admitted there because of urinary infection.  The patient was transferred today complaining of swelling of the neck, difficulty swallowing, headache, nausea, and vomiting.  Physical examination shows a large collection of fluid in the left side of the neck with displacement of the trachea.  The CT scan of the neck went along with this finding.  Because of the finding, we decided to drain the fluid in the neck first and insert a catheter in the lumbar area for drainage of CSF with the hope that will allow her to heal the leak in the cervical spine.  DESCRIPTION OF PROCEDURE:  The patient was taken to the OR, and after intubation, the left side of the neck was cleaned with DuraPrep and drapes were applied.  A small incision about an inch was made following the previous one, and immediately spinous fluid with high pressure was drained.  After we did complete the drainage, we investigated the area of the plate which was solid.  There was no obvious CSF coming through. Valsalva maneuver was negative.  Because of that, we cleaned the area. The are was irrigated and closed with Vicryl and  Steri-Strips.  From then on, we went ahead with the second procedure which involved the seating the patient while she was asleep with a little bit of flexion of the lumbar area.  Then, the lumbar area was cleaned with DuraPrep. Drapes were applied.  Then, using the lumbar catheter needle, we introduced around the L3-4, L4-5.  It was difficult to set the needle, because of the amount of calcification that she has.  Nevertheless, at the end, we had a good, clean CSF coming through.  A catheter was inserted into the arachnoid space. The catheter was hooked to the back for collection.  Drapes were applied to secure the catheter in place.  From then on, the patient once she left to the recovery room, she is going to go to the intensive care unit for the next few days.          ______________________________ Leeroy Cha, M.D.     EB/MEDQ  D:  08/13/2013  T:  08/14/2013  Job:  [id number]

## 2013-08-14 NOTE — Plan of Care (Signed)
Problem: Consults Goal: Diagnosis - Spinal Surgery Lumbar drain from corpectomy

## 2013-08-14 NOTE — Progress Notes (Signed)
Patient ID: Colleen Nunez, female   DOB: Apr 03, 1956, 57 y.o.   MRN: [id number] Doing well. Catheter draining well. No headache. Cervical wound flat

## 2013-08-15 LAB — TYPE AND SCREEN
ABO/RH(D): A POS
Unit division: 0
Unit division: 0
Unit division: 0

## 2013-08-15 LAB — BASIC METABOLIC PANEL
BUN: 8 mg/dL (ref 6–23)
CO2: 22 mEq/L (ref 19–32)
Calcium: 8.5 mg/dL (ref 8.4–10.5)
Chloride: 99 mEq/L (ref 96–112)
Creatinine, Ser: 1.11 mg/dL — ABNORMAL HIGH (ref 0.50–1.10)
Glucose, Bld: 83 mg/dL (ref 70–99)
Potassium: 3.1 mEq/L — ABNORMAL LOW (ref 3.5–5.1)

## 2013-08-15 LAB — CULTURE, BLOOD (ROUTINE X 2): Culture: NO GROWTH

## 2013-08-15 LAB — CBC
HCT: 31.8 % — ABNORMAL LOW (ref 36.0–46.0)
Hemoglobin: 11.3 g/dL — ABNORMAL LOW (ref 12.0–15.0)
MCH: 27.7 pg (ref 26.0–34.0)
Platelets: 332 10*3/uL (ref 150–400)
RBC: 4.08 MIL/uL (ref 3.87–5.11)
WBC: 11.5 10*3/uL — ABNORMAL HIGH (ref 4.0–10.5)

## 2013-08-15 MED ORDER — ENSURE COMPLETE PO LIQD
237.0000 mL | Freq: Two times a day (BID) | ORAL | Status: DC
  Administered 2013-08-15 – 2013-08-22 (×9): 237 mL via ORAL

## 2013-08-15 MED ORDER — AMLODIPINE BESYLATE 10 MG PO TABS
10.0000 mg | ORAL_TABLET | Freq: Every day | ORAL | Status: DC
  Administered 2013-08-17 – 2013-08-22 (×7): 10 mg via ORAL
  Filled 2013-08-15 (×8): qty 1

## 2013-08-15 NOTE — Progress Notes (Signed)
[facility] Progress Note [place] TEAM 1 - Stepdown/ICU TEAM   Colleen Nunez [id number] DOB: 1956/05/04 DOA: 08/07/2013 PCP: Rosita Fire, MD  Admit HPI / Brief Narrative: 57 year old female who initially was admitted to [facility] for dysphasia.  Patient recently had a C-spine surgery prior to this admission.  At [facility] she was seen by GI and a CT scan of the neck revealed a large CSF leak from her recent C-spine surgery compressing her esophagus causing her dysphagia. She was then transferred to [facility] on day 5 of her hospital stay.  She was seen by Neurosurgery and underwent CSF leak decompression on 08/12/2013 by Dr. Benetta Spar.  Assessment/Plan:  Severe dysphagia due to large CSF leak and paraspinal collection  Slowly advancing diet - tolerating mostly full liquids for now - cleared for D3/thin per SLP  Large CSF leak and paraspinal collection As per Neurosurgery   KLEBSIELLA PNEUMONIAE pyelonephritis Proven to be sensitive to rocephin - cont abx tx for planned 10 day course  Hypertension BP increasing - adjust med tx and follow   Gout Well compensated on colchicine  Code Status: FULL Family Communication: no family present at time of exam Disposition Plan: neuro ICU per NS   Consultants: Neurosurgery  Procedures: C-spine CSF collection drained by neurosurgery on 08/12/2013 (Dr Joya Salm)  Antibiotics: Rocephin   DVT prophylaxis: SCDs  HPI/Subjective: Pt is resting comfortably.  Tolerating full liquids thus far.  Denies n/c, abdom pain, sob, or cp.  Objective: Blood pressure 139/90, pulse 80, temperature 97.7 F (36.5 C), temperature source Oral, resp. rate 13, height 5\' 2"  (1.575 m), weight 74.8 kg (164 lb 14.5 oz), SpO2 100.00%.  Intake/Output Summary (Last 24 hours) at 08/15/13 1412 Last data filed at 08/15/13 1200  Gross per 24 hour  Intake   1700 ml  Output   1753 ml  Net    -53 ml   Exam: General: No acute  respiratory distress Lungs: Clear to auscultation bilaterally without wheezes or crackles Cardiovascular: Regular rate and rhythm without murmur gallop or rub normal S1 and S2 Abdomen: Nontender, nondistended, soft, bowel sounds positive, no rebound, no ascites, no appreciable mass Extremities: No significant cyanosis, clubbing, or edema bilateral lower extremities  Data Reviewed: Basic Metabolic Panel:  Recent Labs Lab 08/11/13 0522 08/12/13 0439 08/13/13 0410 08/14/13 0550 08/15/13 0355  NA 134* 137 136 137 137  K 3.3* 3.2* 3.8 3.5 3.1*  CL 99 103 100 104 99  CO2 24 24 24 22 22   GLUCOSE 174* 97 136* 132* 83  BUN 6 4* 4* 7 8  CREATININE 1.21* 1.06 1.01 0.93 1.11*  CALCIUM 8.8 8.9 8.6 8.1* 8.5   Liver Function Tests: No results found for this basename: AST, ALT, ALKPHOS, BILITOT, PROT, ALBUMIN,  in the last 168 hours  CBC:  Recent Labs Lab 08/11/13 0522 08/12/13 0439 08/13/13 0410 08/14/13 0550 08/15/13 0355  WBC 9.4 6.6 6.1 12.5* 11.5*  NEUTROABS  --  3.3  --   --   --   HGB 6.8* 9.5* 11.2* 10.9* 11.3*  HCT 20.9* 28.4* 32.9* 31.9* 31.8*  MCV 81.3 79.8 79.1 78.6 77.9*  PLT 268 281 329 318 332    Recent Results (from the past 240 hour(s))  URINE CULTURE     Status: None   Collection Time    08/07/13  2:10 PM      Result Value Range Status   Specimen Description URINE, CATHETERIZED   Final   Special Requests  NONE   Final   Culture  Setup Time     Final   Value: 08/08/2013 00:11     Performed at [place]     Final   Value: >=100,000 COLONIES/ML     Performed at [facility]   Culture     Final   Value: KLEBSIELLA PNEUMONIAE     Performed at [facility]   Report Status 08/09/2013 FINAL   Final   Organism ID, Bacteria KLEBSIELLA PNEUMONIAE   Final  CULTURE, BLOOD (ROUTINE X 2)     Status: None   Collection Time    08/09/13  3:37 PM      Result Value Range Status   Specimen Description BLOOD RIGHT HAND   Final    Special Requests     Final   Value: BOTTLES DRAWN AEROBIC AND ANAEROBIC AEB=15CC ANA=12CC   Culture NO GROWTH 6 DAYS   Final   Report Status 08/15/2013 FINAL   Final  CULTURE, BLOOD (ROUTINE X 2)     Status: None   Collection Time    08/09/13  3:40 PM      Result Value Range Status   Specimen Description BLOOD LEFT ANTECUBITAL   Final   Special Requests     Final   Value: BOTTLES DRAWN AEROBIC AND ANAEROBIC AEB=12CC ANA=8CC   Culture NO GROWTH 6 DAYS   Final   Report Status 08/15/2013 FINAL   Final  MRSA PCR SCREENING     Status: None   Collection Time    08/13/13  2:05 AM      Result Value Range Status   MRSA by PCR NEGATIVE  NEGATIVE Final   Comment:            The GeneXpert MRSA Assay (FDA     approved for NASAL specimens     only), is one component of a     comprehensive MRSA colonization     surveillance program. It is not     intended to diagnose MRSA     infection nor to guide or     monitor treatment for     MRSA infections.     Studies:  Recent x-ray studies have been reviewed in detail by the Attending Physician  Scheduled Meds:  Scheduled Meds: . bethanechol  25 mg Oral TID  . bisacodyl  10 mg Oral Daily  . cefTRIAXone (ROCEPHIN)  IV  1 g Intravenous Q24H  . colchicine  0.6 mg Oral BID  . feeding supplement  237 mL Oral BID BM  . ondansetron  4 mg Oral TID AC  . pantoprazole  40 mg Oral BID AC  . polyethylene glycol  17 g Oral Daily  . tamsulosin  0.4 mg Oral Daily    Time spent on care of this patient: 35 mins   [place]  [phone] Pager - Text Page per Shea Evans as per below:  On-Call/Text Page:      [email or web address]      password [email or web address]  If 7PM-7AM, please contact night-coverage [email or web address] Password [email or web address] 08/15/2013, 2:12 PM   LOS: 8 days

## 2013-08-15 NOTE — Progress Notes (Addendum)
NUTRITION FOLLOW UP  Intervention:    1. Ensure Complete po BID, each supplement provides 350 kcal and 13 grams of protein.  Nutrition Dx:   Inadequate oral intake related to poor appetite as evidenced by Meal Completion: <50%   Goal:   Pt to meet >/= 90% of their estimated nutrition needs; not met.   Monitor:   PO intake, supplement acceptance, weight trends, labs   Assessment:   Patient who underwent corpectomy in 7/14, has had increased swelling in neck and decreased ability to swallow. Pt with large CSF leak which was drained/decompressed with drain in place 9/28.  Pt did not eat Breakfast this am, states that she did not feel good. Pt asked to have ensure.   Height: Ht Readings from Last 1 Encounters:  08/13/13 5\' 2"  (1.575 m)    Weight Status:   Wt Readings from Last 1 Encounters:  08/13/13 164 lb 14.5 oz (74.8 kg)  Admission weight 190 lb Question accuracy of current weight  Usual weight approx 190 lb 11/2012  Re-estimated needs:  Kcal: 1400-1600  Protein: 75-85 gr  Fluid: >2000 ml/day  Skin: intact   Diet Order: Cardiac Meal Completion: <50%   Intake/Output Summary (Last 24 hours) at 08/15/13 1253 Last data filed at 08/15/13 1200  Gross per 24 hour  Intake   1850 ml  Output   2041 ml  Net   -191 ml    Last BM: 9/28   Labs:   Recent Labs Lab 08/13/13 0410 08/14/13 0550 08/15/13 0355  NA 136 137 137  K 3.8 3.5 3.1*  CL 100 104 99  CO2 24 22 22   BUN 4* 7 8  CREATININE 1.01 0.93 1.11*  CALCIUM 8.6 8.1* 8.5  GLUCOSE 136* 132* 83    CBG (last 3)  No results found for this basename: GLUCAP,  in the last 72 hours  Scheduled Meds: . bethanechol  25 mg Oral TID  . bisacodyl  10 mg Oral Daily  . cefTRIAXone (ROCEPHIN)  IV  1 g Intravenous Q24H  . colchicine  0.6 mg Oral BID  . ondansetron  4 mg Oral TID AC  . pantoprazole  40 mg Oral BID AC  . polyethylene glycol  17 g Oral Daily  . tamsulosin  0.4 mg Oral Daily    Continuous  Infusions: . sodium chloride 75 mL/hr at 08/15/13 [place], [place], [place] Pager [phone] After Hours Pager

## 2013-08-15 NOTE — Progress Notes (Signed)
Patient ID: Colleen Nunez, female   DOB: [birth year], 57 y.o.   MRN: [id number] Lumbar cath working well. Cervical wouind flat. Dr Luiz Ochoa to take over in am.

## 2013-08-15 NOTE — Progress Notes (Signed)
Speech Language Pathology Dysphagia Treatment Patient Details Name: Colleen Nunez MRN: [id number] DOB: 06/30/1956 Today's Date: 08/15/2013 Time: [id number] SLP Time Calculation (min): 19 min  Assessment / Plan / Recommendation Clinical Impression  Pt continues to demonstrate adequate tolerance of thin liquids with no signs of aspiration or discomfort with swallowing.  Consume soft and regular solids without difficulty. PO intake has been poor per RN, though not due to swallowing or odynophagia, more due to pt not wanting to have bowel movements.  Pt does have mild dysphonia, improved with cues to increased breath support and volume. Recommend pt upgrade to a regular diet with thin liquids. SLP will f/u x1 for tolerance. MD wrote for SLP to write diet, will do so.     Diet Recommendation  Initiate / Change Diet: Regular;Thin liquid    SLP Plan Continue with current plan of care   Pertinent Vitals/Pain NA   Swallowing Goals  SLP Swallowing Goals Patient will consume recommended diet without observed clinical signs of aspiration with: Independent assistance Swallow Study Goal #1 - Progress: Progressing toward goal Patient will utilize recommended strategies during swallow to increase swallowing safety with: Independent assistance Swallow Study Goal #2 - Progress: Progressing toward goal  General Temperature Spikes Noted: No Respiratory Status: Room air Behavior/Cognition: Alert;Cooperative;Pleasant mood Oral Cavity - Dentition: Adequate natural dentition Patient Positioning: Upright in bed  Oral Cavity - Oral Hygiene Does patient have any of the following "at risk" factors?: None of the above Brush patient's teeth BID with toothbrush (using toothpaste with fluoride): Yes   Dysphagia Treatment Treatment focused on: Skilled observation of diet tolerance;Upgraded PO texture trials;Patient/family/caregiver education Treatment Methods/Modalities: Skilled observation Patient observed  directly with PO's: Yes Type of PO's observed: Regular;Thin liquids Feeding: Able to feed self Liquids provided via: Straw;Cup Type of cueing: Verbal Amount of cueing: Minimal   GO    Herbie [place], MA CCC-SLP [phone]  Lynann Beaver 08/15/2013, 8:54 AM

## 2013-08-16 ENCOUNTER — Encounter (HOSPITAL_COMMUNITY): Payer: Self-pay | Admitting: Neurosurgery

## 2013-08-16 DIAGNOSIS — N179 Acute kidney failure, unspecified: Secondary | ICD-10-CM | POA: Diagnosis not present

## 2013-08-16 DIAGNOSIS — E876 Hypokalemia: Secondary | ICD-10-CM | POA: Diagnosis present

## 2013-08-16 LAB — CBC
HCT: 33.3 % — ABNORMAL LOW (ref 36.0–46.0)
Hemoglobin: 11.4 g/dL — ABNORMAL LOW (ref 12.0–15.0)
MCH: 27 pg (ref 26.0–34.0)
MCHC: 34.2 g/dL (ref 30.0–36.0)
RDW: 14.6 % (ref 11.5–15.5)

## 2013-08-16 LAB — BASIC METABOLIC PANEL
BUN: 9 mg/dL (ref 6–23)
Calcium: 8.3 mg/dL — ABNORMAL LOW (ref 8.4–10.5)
Creatinine, Ser: 1.3 mg/dL — ABNORMAL HIGH (ref 0.50–1.10)
GFR calc Af Amer: 52 mL/min — ABNORMAL LOW (ref >90)
GFR calc non Af Amer: 45 mL/min — ABNORMAL LOW (ref >90)
Glucose, Bld: 100 mg/dL — ABNORMAL HIGH (ref 70–99)
Potassium: 2.9 mEq/L — ABNORMAL LOW (ref 3.5–5.1)

## 2013-08-16 MED ORDER — POTASSIUM CHLORIDE 20 MEQ/15ML (10%) PO LIQD
ORAL | Status: AC
  Filled 2013-08-16: qty 30

## 2013-08-16 MED ORDER — POTASSIUM CHLORIDE CRYS ER 20 MEQ PO TBCR
40.0000 meq | EXTENDED_RELEASE_TABLET | ORAL | Status: DC
  Filled 2013-08-16: qty 2

## 2013-08-16 MED ORDER — POTASSIUM CHLORIDE 20 MEQ/15ML (10%) PO LIQD
40.0000 meq | ORAL | Status: AC
  Filled 2013-08-16 (×5): qty 30

## 2013-08-16 MED ORDER — POTASSIUM CHLORIDE 10 MEQ/100ML IV SOLN
10.0000 meq | INTRAVENOUS | Status: AC
  Administered 2013-08-16 (×6): 10 meq via INTRAVENOUS
  Filled 2013-08-16 (×6): qty 100

## 2013-08-16 MED ORDER — MAGNESIUM SULFATE 40 MG/ML IJ SOLN
2.0000 g | Freq: Once | INTRAMUSCULAR | Status: AC
  Administered 2013-08-16: 2 g via INTRAVENOUS
  Filled 2013-08-16: qty 50

## 2013-08-16 MED ORDER — ALLOPURINOL 100 MG PO TABS
100.0000 mg | ORAL_TABLET | Freq: Every day | ORAL | Status: DC
  Administered 2013-08-17 – 2013-08-18 (×2): 100 mg via ORAL
  Filled 2013-08-16 (×3): qty 1

## 2013-08-16 NOTE — Progress Notes (Signed)
[facility] Progress Note [place] TEAM 1 - Stepdown/ICU TEAM   Colleen Nunez [id number] DOB: 01-Apr-1956 DOA: 08/07/2013 PCP: Rosita Fire, MD  Admit HPI / Brief Narrative: 57 year old female who initially was admitted to [facility] for dysphasia.  Patient recently had a C-spine surgery prior to this admission.  At [facility] she was seen by GI and a CT scan of the neck revealed a large CSF leak from her recent C-spine surgery compressing her esophagus causing her dysphagia. She was then transferred to [facility] on day 5 of her hospital stay.  She was seen by Neurosurgery and underwent CSF leak decompression on 08/12/2013 by Dr. Benetta Spar.  Assessment/Plan:  Severe dysphagia due to large CSF leak and paraspinal collection  Slowly advancing diet - tolerating mostly full liquids for now - cleared for D3/thin per SLP  Large CSF leak and paraspinal collection As per Neurosurgery - has drain- bed rest only  Nausea/ vomiting - stop Colchicine -cont Zofran  KLEBSIELLA PNEUMONIAE pyelonephritis Proven to be sensitive to rocephin - cont abx tx for planned 10 day course- to stop tomorrow  Hypertension BP increasing - adjusted med tx - now improved  ARF - stopping Colchicine - increased IVF from 50 to 100 cc/hr and follow- pt not eating or drinking much  Hypokalemia - vomited with PO liquid K - IV K - check mag in AM- have given 2 gm today  Gout Switch to Allopurinol  Hep C positive  Code Status: FULL Family Communication: no family present at time of exam Disposition Plan: neuro ICU per NS   Consultants: Neurosurgery  Procedures: C-spine CSF collection drained by neurosurgery on 08/12/2013 (Dr Joya Salm)  Antibiotics: Rocephin   DVT prophylaxis: SCDs  HPI/Subjective: Pt is very nauseated. Poor PO intake.   Objective: Blood pressure 137/99, pulse 86, temperature 98.3 F (36.8 C), temperature source Oral, resp. rate 11, height 5\' 2"   (1.575 m), weight 74.8 kg (164 lb 14.5 oz), SpO2 100.00%.  Intake/Output Summary (Last 24 hours) at 08/16/13 1722 Last data filed at 08/16/13 1512  Gross per 24 hour  Intake   1160 ml  Output   1152 ml  Net      8 ml   Exam: General: No acute respiratory distress Lungs: Clear to auscultation bilaterally without wheezes or crackles Cardiovascular: Regular rate and rhythm without murmur gallop or rub normal S1 and S2 Abdomen: Nontender, nondistended, soft, bowel sounds positive, no rebound, no ascites, no appreciable mass Extremities: No significant cyanosis, clubbing, or edema bilateral lower extremities  Data Reviewed: Basic Metabolic Panel:  Recent Labs Lab 08/12/13 0439 08/13/13 0410 08/14/13 0550 08/15/13 0355 08/16/13 0415  NA 137 136 137 137 140  K 3.2* 3.8 3.5 3.1* 2.9*  CL 103 100 104 99 104  CO2 24 24 22 22 24   GLUCOSE 97 136* 132* 83 100*  BUN 4* 4* 7 8 9   CREATININE 1.06 1.01 0.93 1.11* 1.30*  CALCIUM 8.9 8.6 8.1* 8.5 8.3*   Liver Function Tests: No results found for this basename: AST, ALT, ALKPHOS, BILITOT, PROT, ALBUMIN,  in the last 168 hours  CBC:  Recent Labs Lab 08/12/13 0439 08/13/13 0410 08/14/13 0550 08/15/13 0355 08/16/13 0415  WBC 6.6 6.1 12.5* 11.5* 7.7  NEUTROABS 3.3  --   --   --   --   HGB 9.5* 11.2* 10.9* 11.3* 11.4*  HCT 28.4* 32.9* 31.9* 31.8* 33.3*  MCV 79.8 79.1 78.6 77.9* 78.7  PLT 281 329 318 332  299    Recent Results (from the past 240 hour(s))  URINE CULTURE     Status: None   Collection Time    08/07/13  2:10 PM      Result Value Range Status   Specimen Description URINE, CATHETERIZED   Final   Special Requests NONE   Final   Culture  Setup Time     Final   Value: 08/08/2013 00:11     Performed at [place]     Final   Value: >=100,000 COLONIES/ML     Performed at [facility]   Culture     Final   Value: KLEBSIELLA PNEUMONIAE     Performed at [facility]   Report  Status 08/09/2013 FINAL   Final   Organism ID, Bacteria KLEBSIELLA PNEUMONIAE   Final  CULTURE, BLOOD (ROUTINE X 2)     Status: None   Collection Time    08/09/13  3:37 PM      Result Value Range Status   Specimen Description BLOOD RIGHT HAND   Final   Special Requests     Final   Value: BOTTLES DRAWN AEROBIC AND ANAEROBIC AEB=15CC ANA=12CC   Culture NO GROWTH 6 DAYS   Final   Report Status 08/15/2013 FINAL   Final  CULTURE, BLOOD (ROUTINE X 2)     Status: None   Collection Time    08/09/13  3:40 PM      Result Value Range Status   Specimen Description BLOOD LEFT ANTECUBITAL   Final   Special Requests     Final   Value: BOTTLES DRAWN AEROBIC AND ANAEROBIC AEB=12CC ANA=8CC   Culture NO GROWTH 6 DAYS   Final   Report Status 08/15/2013 FINAL   Final  MRSA PCR SCREENING     Status: None   Collection Time    08/13/13  2:05 AM      Result Value Range Status   MRSA by PCR NEGATIVE  NEGATIVE Final   Comment:            The GeneXpert MRSA Assay (FDA     approved for NASAL specimens     only), is one component of a     comprehensive MRSA colonization     surveillance program. It is not     intended to diagnose MRSA     infection nor to guide or     monitor treatment for     MRSA infections.     Studies:  Recent x-ray studies have been reviewed in detail by the Attending Physician  Scheduled Meds:  Scheduled Meds: . allopurinol  100 mg Oral Daily  . amLODipine  10 mg Oral Daily  . bethanechol  25 mg Oral TID  . bisacodyl  10 mg Oral Daily  . cefTRIAXone (ROCEPHIN)  IV  1 g Intravenous Q24H  . feeding supplement  237 mL Oral BID BM  . magnesium sulfate 1 - 4 g bolus IVPB  2 g Intravenous Once  . ondansetron  4 mg Oral TID AC  . pantoprazole  40 mg Oral BID AC  . polyethylene glycol  17 g Oral Daily  . potassium chloride  10 mEq Intravenous Q1 Hr x 6  . potassium chloride  40 mEq Oral Q4H  . tamsulosin  0.4 mg Oral Daily    Time spent on care of this patient: 72  mins   Perry, MD  [facility] Office  [phone] Pager -  Text Page per Shea Evans as per below:  On-Call/Text Page:      [email or web address]      password [email or web address]  If 7PM-7AM, please contact night-coverage [email or web address] Password [email or web address] 08/16/2013, 5:22 PM   LOS: 9 days

## 2013-08-16 NOTE — Progress Notes (Signed)
Patient ID: Colleen Nunez, female   DOB: 01/02/1956, 57 y.o.   MRN: [id number] Wound flat, no re-accumulation of fluid. No headache. Dr Luiz Ochoa to take over her care

## 2013-08-16 NOTE — Progress Notes (Signed)
Doing well.  Eating better Temp:  [97.8 F (36.6 C)-98.3 F (36.8 C)] 97.8 F (36.6 C) (09/30 0800) Pulse Rate:  [74-101] 94 (09/30 1300) Resp:  [4-21] 14 (09/30 1300) BP: (113-142)/(72-111) 140/95 mmHg (09/30 1300) SpO2:  [99 %-100 %] 100 % (09/30 1300) Lumbar drain working  - no re-accumulation of fluid  Plan: Continue lumbar drain

## 2013-08-16 NOTE — Progress Notes (Signed)
SLP Cancellation Note  Patient Details Name: Colleen Nunez MRN: [id number] DOB: 1956/04/09   Cancelled treatment:       Reason Eval/Treat Not Completed: Medical issues which prohibited therapy. Pt refusing meals due to nausea. Will attempt to check tolerance of solids tomorrow. Herbie [place], [place] CCC-SLP [phone]    Lynann Beaver 08/16/2013, 9:35 AM

## 2013-08-17 LAB — BASIC METABOLIC PANEL
BUN: 11 mg/dL (ref 6–23)
Calcium: 8.5 mg/dL (ref 8.4–10.5)
Creatinine, Ser: 1.24 mg/dL — ABNORMAL HIGH (ref 0.50–1.10)
GFR calc non Af Amer: 47 mL/min — ABNORMAL LOW (ref >90)
Glucose, Bld: 75 mg/dL (ref 70–99)
Potassium: 3.9 mEq/L (ref 3.5–5.1)
Sodium: 137 mEq/L (ref 135–145)

## 2013-08-17 LAB — CBC
Hemoglobin: 11.8 g/dL — ABNORMAL LOW (ref 12.0–15.0)
MCH: 26.9 pg (ref 26.0–34.0)
MCHC: 34 g/dL (ref 30.0–36.0)
MCV: 79 fL (ref 78.0–100.0)
RBC: 4.39 MIL/uL (ref 3.87–5.11)
RDW: 14.8 % (ref 11.5–15.5)

## 2013-08-17 LAB — HEPATITIS C GENOTYPE

## 2013-08-17 MED ORDER — MAGNESIUM SULFATE 40 MG/ML IJ SOLN
2.0000 g | Freq: Once | INTRAMUSCULAR | Status: AC
  Administered 2013-08-17: 2 g via INTRAVENOUS
  Filled 2013-08-17 (×2): qty 50

## 2013-08-17 NOTE — Progress Notes (Signed)
Speech Language Pathology Dysphagia Treatment Patient Details Name: Colleen Nunez MRN: [id number] DOB: May 11, 1956 Today's Date: 08/17/2013 Time: 0915-0930 SLP Time Calculation (min): 15 min  Assessment / Plan / Recommendation Clinical Impression  Pt observed with regular solids at breakfast. Pt avoids broken toother, otherwise oral preparation and pharyngeal swallow appear WNL. Pt tolerating diet. No SLP f/u needed, will sign off.     Diet Recommendation  Continue with Current Diet: Regular;Thin liquid    SLP Plan All goals met;Discharge SLP treatment due to (comment)   Pertinent Vitals/Pain NA   Swallowing Goals  SLP Swallowing Goals Patient will consume recommended diet without observed clinical signs of aspiration with: Independent assistance Swallow Study Goal #1 - Progress: Met Patient will utilize recommended strategies during swallow to increase swallowing safety with: Independent assistance Swallow Study Goal #2 - Progress: Met  General Temperature Spikes Noted: No Respiratory Status: Room air Behavior/Cognition: Alert;Cooperative;Pleasant mood Oral Cavity - Dentition: Adequate natural dentition Patient Positioning: Upright in bed  Oral Cavity - Oral Hygiene Does patient have any of the following "at risk" factors?: None of the above   Dysphagia Treatment Treatment focused on: Skilled observation of diet tolerance Treatment Methods/Modalities: Skilled observation Patient observed directly with PO's: Yes Type of PO's observed: Regular;Thin liquids Feeding: Able to feed self Liquids provided via: [employer], [place] CCC-SLP [phone]  Lynann Beaver 08/17/2013, 9:44 AM

## 2013-08-17 NOTE — Progress Notes (Addendum)
[facility] Progress Note [place] TEAM 1 - Stepdown/ICU TEAM   ILLYA ROSTEK [id number] DOB: 1956-01-17 DOA: 08/07/2013 PCP: Rosita Fire, MD  Admit HPI / Brief Narrative: 57 year old female who initially was admitted to [facility] for dysphagia.  Patient recently had a C-spine surgery prior to this admission.  At [facility] she was seen by GI and a CT scan of the neck revealed a large CSF leak from her recent C-spine surgery compressing her esophagus. She was then transferred to [facility] on day 5 of her hospital stay.  She was seen by Neurosurgery and underwent CSF leak decompression on 08/12/2013 by Dr. Joya Salm.  Assessment/Plan:  Severe dysphagia - acutely exacerbated by large CSF leak/paraspinal collection  The NS who knew the pt previously reports that she was experiencing dysphagia/vomiting PRIOR to her surgery - review of records reveals GI notation of pt compalining of difficulty swallowing solids back in 2009, at which time plan was to obtain EGD, though this was not completed - I suspect pt may have an esophageal ring - acute worsening likely related to CSF leak, but ?not the underlying etio - pt now cleared for regular diet with thin liquids per SLP, and is thus far tolerating, though she admits solids sometimes "feel like they are stikcing on the way down" - consider barium esophagram when drain out   Large CSF leak and paraspinal collection As per Neurosurgery - has drain, with plans to continue for another 1-3 days   Nausea/ vomiting - stopped Colchicine - sx improved today   KLEBSIELLA PNEUMONIAE pyelonephritis Proven to be sensitive to rocephin - has completed 10 day course of tx   Hypertension BP reasonably controlled at this time, though not at goal - follow w/o change today   ARF  - stopped colchicine - hydrating gently - improving - f/u in AM  Hypokalemia - K+ improved today - Mg quite low - replace again today - f/u K and Mg in AM    Gout Switched to Allopurinol - controlled at present   Hep C positive  Code Status: FULL Family Communication: no family present at time of exam Disposition Plan: Neuro ICU per NS   Consultants: Neurosurgery  Procedures: C-spine CSF collection drained by Neurosurgery on 08/12/2013 (Dr Joya Salm)  Antibiotics: Rocephin 9/21 >> 9/30  DVT prophylaxis: SCDs  HPI/Subjective: Nausea much improved today.  Tolerating regular diet at this time.  Denise f/c, sob, n/v, or abdom pain.    Objective: Blood pressure 138/88, pulse 96, temperature 97.7 F (36.5 C), temperature source Oral, resp. rate 15, height 5\' 2"  (1.575 m), weight 74.8 kg (164 lb 14.5 oz), SpO2 100.00%.  Intake/Output Summary (Last 24 hours) at 08/17/13 1128 Last data filed at 08/17/13 1100  Gross per 24 hour  Intake 2532.5 ml  Output   1206 ml  Net 1326.5 ml   Exam: General: No acute respiratory distress Lungs: Clear to auscultation bilaterally without wheezes or crackles Cardiovascular: Regular rate and rhythm without murmur gallop or rub  Abdomen: Nontender, nondistended, soft, bowel sounds positive, no rebound, no ascites, no appreciable mass Extremities: No significant cyanosis, clubbing, or edema bilateral lower extremities  Data Reviewed: Basic Metabolic Panel:  Recent Labs Lab 08/13/13 0410 08/14/13 0550 08/15/13 0355 08/16/13 0415 08/17/13 0520  NA 136 137 137 140 137  K 3.8 3.5 3.1* 2.9* 3.9  CL 100 104 99 104 102  CO2 24 22 22 24 20   GLUCOSE 136* 132* 83 100* 75  BUN  4* 7 8 9 11   CREATININE 1.01 0.93 1.11* 1.30* 1.24*  CALCIUM 8.6 8.1* 8.5 8.3* 8.5  MG  --   --   --   --  1.0*   Liver Function Tests: No results found for this basename: AST, ALT, ALKPHOS, BILITOT, PROT, ALBUMIN,  in the last 168 hours  CBC:  Recent Labs Lab 08/12/13 0439 08/13/13 0410 08/14/13 0550 08/15/13 0355 08/16/13 0415 08/17/13 0520  WBC 6.6 6.1 12.5* 11.5* 7.7 8.4  NEUTROABS 3.3  --   --   --   --   --    HGB 9.5* 11.2* 10.9* 11.3* 11.4* 11.8*  HCT 28.4* 32.9* 31.9* 31.8* 33.3* 34.7*  MCV 79.8 79.1 78.6 77.9* 78.7 79.0  PLT 281 329 318 332 299 312    Recent Results (from the past 240 hour(s))  URINE CULTURE     Status: None   Collection Time    08/07/13  2:10 PM      Result Value Range Status   Specimen Description URINE, CATHETERIZED   Final   Special Requests NONE   Final   Culture  Setup Time     Final   Value: 08/08/2013 00:11     Performed at [place]     Final   Value: >=100,000 COLONIES/ML     Performed at [facility]   Culture     Final   Value: KLEBSIELLA PNEUMONIAE     Performed at [facility]   Report Status 08/09/2013 FINAL   Final   Organism ID, Bacteria KLEBSIELLA PNEUMONIAE   Final  CULTURE, BLOOD (ROUTINE X 2)     Status: None   Collection Time    08/09/13  3:37 PM      Result Value Range Status   Specimen Description BLOOD RIGHT HAND   Final   Special Requests     Final   Value: BOTTLES DRAWN AEROBIC AND ANAEROBIC AEB=15CC ANA=12CC   Culture NO GROWTH 6 DAYS   Final   Report Status 08/15/2013 FINAL   Final  CULTURE, BLOOD (ROUTINE X 2)     Status: None   Collection Time    08/09/13  3:40 PM      Result Value Range Status   Specimen Description BLOOD LEFT ANTECUBITAL   Final   Special Requests     Final   Value: BOTTLES DRAWN AEROBIC AND ANAEROBIC AEB=12CC ANA=8CC   Culture NO GROWTH 6 DAYS   Final   Report Status 08/15/2013 FINAL   Final  MRSA PCR SCREENING     Status: None   Collection Time    08/13/13  2:05 AM      Result Value Range Status   MRSA by PCR NEGATIVE  NEGATIVE Final   Comment:            The GeneXpert MRSA Assay (FDA     approved for NASAL specimens     only), is one component of a     comprehensive MRSA colonization     surveillance program. It is not     intended to diagnose MRSA     infection nor to guide or     monitor treatment for     MRSA infections.     Studies:  Recent  x-ray studies have been reviewed in detail by the Attending Physician  Scheduled Meds:  Scheduled Meds: . allopurinol  100 mg Oral Daily  . amLODipine  10 mg Oral  Daily  . bethanechol  25 mg Oral TID  . bisacodyl  10 mg Oral Daily  . feeding supplement  237 mL Oral BID BM  . ondansetron  4 mg Oral TID AC  . pantoprazole  40 mg Oral BID AC  . polyethylene glycol  17 g Oral Daily  . tamsulosin  0.4 mg Oral Daily    Time spent on care of this patient: 35 mins   MCCLUNG,JEFFREY T, MD  [facility] Office  [phone] Pager - Text Page per Shea Evans as per below:  On-Call/Text Page:      [email or web address]      password [email or web address]  If 7PM-7AM, please contact night-coverage [email or web address] Password [email or web address] 08/17/2013, 11:28 AM   LOS: 10 days

## 2013-08-17 NOTE — Progress Notes (Signed)
NUTRITION FOLLOW UP  Intervention:    1. Ensure Complete po BID, each supplement provides 350 kcal and 13 grams of protein.  2. Encouraged pt to consume supplements in addition to meals.   Nutrition Dx:   Inadequate oral intake related to poor appetite as evidenced by Meal Completion: <50%; ongoing.   Goal:   Pt to meet >/= 90% of their estimated nutrition needs; not met.   Monitor:   PO intake, supplement acceptance, weight trends, labs   Assessment:   Patient who underwent corpectomy in 7/14, has had increased swelling in neck and decreased ability to swallow. Pt with large CSF leak which was drained/decompressed with drain in place 9/28.  Pt reports that she is consuming about 1/2 of her meals. Pt likes ensure but didn't know if she needed to drink them since she was eating now. Pt with an emesis episode yesterday after taking oral potassium but otherwise denies any N/V after meals.   Height: Ht Readings from Last 1 Encounters:  08/13/13 5\' 2"  (1.575 m)    Weight Status:   Wt Readings from Last 1 Encounters:  08/13/13 164 lb 14.5 oz (74.8 kg)  Admission weight 190 lb Question accuracy of current weight  Usual weight approx 190 lb 11/2012  Re-estimated needs:  Kcal: 1400-1600  Protein: 75-85 gr  Fluid: >2000 ml/day  Skin: intact   Diet Order: Cardiac Meal Completion: 50%   Intake/Output Summary (Last 24 hours) at 08/17/13 1148 Last data filed at 08/17/13 1100  Gross per 24 hour  Intake 2532.5 ml  Output   1206 ml  Net 1326.5 ml    Last BM: 9/28   Labs:   Recent Labs Lab 08/15/13 0355 08/16/13 0415 08/17/13 0520  NA 137 140 137  K 3.1* 2.9* 3.9  CL 99 104 102  CO2 22 24 20   BUN 8 9 11   CREATININE 1.11* 1.30* 1.24*  CALCIUM 8.5 8.3* 8.5  MG  --   --  1.0*  GLUCOSE 83 100* 75    CBG (last 3)  No results found for this basename: GLUCAP,  in the last 72 hours  Scheduled Meds: . allopurinol  100 mg Oral Daily  . amLODipine  10 mg Oral Daily   . bethanechol  25 mg Oral TID  . bisacodyl  10 mg Oral Daily  . feeding supplement  237 mL Oral BID BM  . magnesium sulfate 1 - 4 g bolus IVPB  2 g Intravenous Once  . ondansetron  4 mg Oral TID AC  . pantoprazole  40 mg Oral BID AC  . polyethylene glycol  17 g Oral Daily  . tamsulosin  0.4 mg Oral Daily    Continuous Infusions: . sodium chloride 100 mL/hr at 08/17/13 [address], [place], [place] Pager [phone] After Hours Pager

## 2013-08-17 NOTE — Progress Notes (Signed)
Doing well. Eating better  - but then still vomits at times a little while after eating  - this was going on PRE-OP!  Temp:  [97.7 F (36.5 C)-98.3 F (36.8 C)] 97.7 F (36.5 C) (10/01 0700) Pulse Rate:  [83-101] 96 (10/01 0800) Resp:  [11-28] 17 (10/01 0800) BP: (100-162)/(81-109) 137/99 mmHg (10/01 0800) SpO2:  [97 %-100 %] 100 % (10/01 0800) Incision flat  - drain working  Plan: Continue lumbar drain another 1-3 days -   Her dysphagia and vomiting was going on PRE-OP to her neck surgery  - the fluid collection may have exacerbated this  - but was not the underlying cause  -  And is still persisting  - defer to medicine service.

## 2013-08-18 ENCOUNTER — Inpatient Hospital Stay (HOSPITAL_COMMUNITY): Payer: Medicaid Other

## 2013-08-18 DIAGNOSIS — R1310 Dysphagia, unspecified: Secondary | ICD-10-CM

## 2013-08-18 LAB — URINALYSIS, ROUTINE W REFLEX MICROSCOPIC
Glucose, UA: NEGATIVE mg/dL
Leukocytes, UA: NEGATIVE
pH: 6.5 (ref 5.0–8.0)

## 2013-08-18 LAB — BASIC METABOLIC PANEL
BUN: 13 mg/dL (ref 6–23)
CO2: 22 mEq/L (ref 19–32)
Chloride: 107 mEq/L (ref 96–112)
GFR calc non Af Amer: 42 mL/min — ABNORMAL LOW (ref >90)
Potassium: 3.5 mEq/L (ref 3.5–5.1)
Sodium: 141 mEq/L (ref 135–145)

## 2013-08-18 MED ORDER — METRONIDAZOLE IN NACL 5-0.79 MG/ML-% IV SOLN
500.0000 mg | Freq: Three times a day (TID) | INTRAVENOUS | Status: AC
  Administered 2013-08-18 – 2013-08-19 (×5): 500 mg via INTRAVENOUS
  Filled 2013-08-18 (×6): qty 100

## 2013-08-18 MED ORDER — DEXTROSE 5 % IV SOLN
1.0000 g | INTRAVENOUS | Status: AC
  Administered 2013-08-18 – 2013-08-19 (×2): 1 g via INTRAVENOUS
  Filled 2013-08-18 (×2): qty 10

## 2013-08-18 NOTE — Progress Notes (Signed)
Doing well. Less nausia  Temp:  [97.9 F (36.6 C)-98.9 F (37.2 C)] 98.7 F (37.1 C) (10/02 1200) Pulse Rate:  [75-95] 89 (10/02 1200) Resp:  [11-22] 20 (10/02 1200) BP: (98-129)/(66-88) 119/77 mmHg (10/02 1200) SpO2:  [94 %-100 %] 100 % (10/02 1200) Incision flat  - drain working  Plan: Cont iv abx until drain out  - plan to remove drain in 2 days

## 2013-08-18 NOTE — Progress Notes (Addendum)
[facility] Progress Note [place] TEAM 1 - Stepdown/ICU TEAM   EBBA KLOOS [id number] DOB: [birth year] DOA: 08/07/2013 PCP: Rosita Fire, MD  Admit HPI / Brief Narrative: 57 year old female who initially was admitted to [facility] for dysphagia.  Patient recently had a C-spine surgery prior to this admission.  At [facility] she was seen by GI and a CT scan of the neck revealed a large CSF leak from her recent C-spine surgery compressing her esophagus. She was then transferred to [facility] on day 5 of her hospital stay.  She was seen by Neurosurgery and underwent CSF leak decompression on 08/12/2013 by Dr. Joya Salm.  Assessment/Plan:  Severe dysphagia - acutely exacerbated by large CSF leak/paraspinal collection  The NS who knew the pt previously reports that she was experiencing dysphagia/vomiting PRIOR to her surgery - review of records reveals GI notation of pt compalining of difficulty swallowing solids back in 2009, at which time plan was to obtain EGD, though this was not completed - I suspect pt may have an esophageal ring - acute worsening likely related to CSF leak, but ?not the underlying etio - pt now cleared for regular diet with thin liquids per SLP, and is thus far tolerating, though she admits solids sometimes "feel like they are sticking on the way down" - consider barium esophagram when drain out   Large CSF leak and paraspinal collection As per Neurosurgery - has drain, with plans to remove on Saturday  Nausea/ vomiting - stopped Colchicine - symptoms resolved  KLEBSIELLA PNEUMONIAE pyelonephritis Proven to be sensitive to rocephin - has completed 10 day course of tx but NS would like to continue Rocephin until drain removed  Hypertension BP controlled  ARF  - stopped colchicine - hydrating gently -slightly worse today- check renal ultrasound- check UA -   Hypokalemia - K+ stable today  Gout Switched to Allopurinol - will hold as Cr  not improving and it can cause ARF  Tooth abscess with pain - lower left incisor - Add Flagly to Rocephin and follow  Hep C positive  Code Status: FULL Family Communication: no family present at time of exam Disposition Plan: Neuro ICU per NS   Consultants: Neurosurgery  Procedures: C-spine CSF collection drained by Neurosurgery on 08/12/2013 (Dr Joya Salm)  Antibiotics: Rocephin 9/21 >> 9/30  DVT prophylaxis: SCDs  HPI/Subjective: Nausea still under control- eating without dysphagia. C/o tooth and gum pain today.   Objective: Blood pressure 118/77, pulse 101, temperature 98.7 F (37.1 C), temperature source Oral, resp. rate 24, height 5\' 2"  (1.575 m), weight 74.8 kg (164 lb 14.5 oz), SpO2 100.00%.  Intake/Output Summary (Last 24 hours) at 08/18/13 1409 Last data filed at 08/18/13 1300  Gross per 24 hour  Intake   2300 ml  Output   1958 ml  Net    342 ml   Exam: General: No acute respiratory distress Lungs: Clear to auscultation bilaterally without wheezes or crackles Cardiovascular: Regular rate and rhythm without murmur gallop or rub  Abdomen: Nontender, nondistended, soft, bowel sounds positive, no rebound, no ascites, no appreciable mass Extremities: No significant cyanosis, clubbing, or edema bilateral lower extremities  Data Reviewed: Basic Metabolic Panel:  Recent Labs Lab 08/14/13 0550 08/15/13 0355 08/16/13 0415 08/17/13 0520 08/18/13 0443  NA 137 137 140 137 141  K 3.5 3.1* 2.9* 3.9 3.5  CL 104 99 104 102 107  CO2 22 22 24 20 22   GLUCOSE 132* 83 100* 75 100*  BUN 7  8 9 11 13   CREATININE 0.93 1.11* 1.30* 1.24* 1.37*  CALCIUM 8.1* 8.5 8.3* 8.5 8.2*  MG  --   --   --  1.0*  --    Liver Function Tests: No results found for this basename: AST, ALT, ALKPHOS, BILITOT, PROT, ALBUMIN,  in the last 168 hours  CBC:  Recent Labs Lab 08/12/13 0439 08/13/13 0410 08/14/13 0550 08/15/13 0355 08/16/13 0415 08/17/13 0520  WBC 6.6 6.1 12.5* 11.5* 7.7  8.4  NEUTROABS 3.3  --   --   --   --   --   HGB 9.5* 11.2* 10.9* 11.3* 11.4* 11.8*  HCT 28.4* 32.9* 31.9* 31.8* 33.3* 34.7*  MCV 79.8 79.1 78.6 77.9* 78.7 79.0  PLT 281 329 318 332 299 312    Recent Results (from the past 240 hour(s))  CULTURE, BLOOD (ROUTINE X 2)     Status: None   Collection Time    08/09/13  3:37 PM      Result Value Range Status   Specimen Description BLOOD RIGHT HAND   Final   Special Requests     Final   Value: BOTTLES DRAWN AEROBIC AND ANAEROBIC AEB=15CC ANA=12CC   Culture NO GROWTH 6 DAYS   Final   Report Status 08/15/2013 FINAL   Final  CULTURE, BLOOD (ROUTINE X 2)     Status: None   Collection Time    08/09/13  3:40 PM      Result Value Range Status   Specimen Description BLOOD LEFT ANTECUBITAL   Final   Special Requests     Final   Value: BOTTLES DRAWN AEROBIC AND ANAEROBIC AEB=12CC ANA=8CC   Culture NO GROWTH 6 DAYS   Final   Report Status 08/15/2013 FINAL   Final  MRSA PCR SCREENING     Status: None   Collection Time    08/13/13  2:05 AM      Result Value Range Status   MRSA by PCR NEGATIVE  NEGATIVE Final   Comment:            The GeneXpert MRSA Assay (FDA     approved for NASAL specimens     only), is one component of a     comprehensive MRSA colonization     surveillance program. It is not     intended to diagnose MRSA     infection nor to guide or     monitor treatment for     MRSA infections.     Studies:  Recent x-ray studies have been reviewed in detail by the Attending Physician  Scheduled Meds:  Scheduled Meds: . amLODipine  10 mg Oral Daily  . bethanechol  25 mg Oral TID  . bisacodyl  10 mg Oral Daily  . cefTRIAXone (ROCEPHIN)  IV  1 g Intravenous Q24H  . feeding supplement  237 mL Oral BID BM  . metronidazole  500 mg Intravenous Q8H  . ondansetron  4 mg Oral TID AC  . pantoprazole  40 mg Oral BID AC  . polyethylene glycol  17 g Oral Daily  . tamsulosin  0.4 mg Oral Daily    Time spent on care of this patient: 98  mins   Debbe Odea, MD  [facility] Office  [phone] Pager - Text Page per Shea Evans as per below:  On-Call/Text Page:      [email or web address]      password [email or web address]  If 7PM-7AM, please contact night-coverage [email or web address] Password [email or web address] 08/18/2013, 2:09 PM   LOS: 11  days

## 2013-08-19 ENCOUNTER — Inpatient Hospital Stay (HOSPITAL_COMMUNITY): Payer: Medicaid Other

## 2013-08-19 NOTE — Progress Notes (Signed)
Rehab Admissions Coordinator Note:  Patient was screened by Colleen Nunez for appropriateness for an Inpatient Acute Rehab Consult.  Noted PT recommending CIR.  At this time, we are recommending Inpatient Rehab consult.  Jodell Cipro M 08/19/2013, 4:00 PM  I can be reached at [phone].

## 2013-08-19 NOTE — Progress Notes (Signed)
Doing well.   Temp:  [97.8 F (36.6 C)-99 F (37.2 C)] 98.1 F (36.7 C) (10/03 0812) Pulse Rate:  [77-107] 77 (10/03 0800) Resp:  [14-24] 14 (10/03 0800) BP: (90-128)/(51-86) 123/86 mmHg (10/03 0800) SpO2:  [97 %-100 %] 100 % (10/03 0800) Incision flat Plan: Drain removed  -   Increase activity and have PT work with patient  - ok to transfer to floor and D/C home next 1-2 days  - can D/C IV abx after todays dose  -      Will follow

## 2013-08-19 NOTE — Evaluation (Signed)
Physical Therapy Evaluation Patient Details Name: Colleen Nunez MRN: [id number] DOB: 21-Nov-1955 Today's Date: 08/19/2013 Time: [id number] PT Time Calculation (min): 31 min  PT Assessment / Plan / Recommendation History of Present Illness  Pt admit with CSF leak after C4-5 corpectomy and C6-7 ACF. in July 2014. Now s/p evacuation and lumbar drain (now d/c'd)  Clinical Impression  Patient is s/p above surgery resulting in the deficits listed below (see PT Problem List). Pt is very determined to return to her home with intermittent assist. Pt reports is has been 2-3 years since she last fell and "knows how to be careful." Patient will benefit from skilled PT to increase their independence and safety with mobility (while adhering to their precautions) to allow discharge to next venue of care.     PT Assessment  Patient needs continued PT services    Follow Up Recommendations  CIR (pt adamant about ultimate d/c to home and does not want SNF)    Does the patient have the potential to tolerate intense rehabilitation      Barriers to Discharge Decreased caregiver support does not have 24/7 assist, however could return to modified independent level with continued PT    Equipment Recommendations  Other (comment) (pt reports a w/c will not fit inside her home/thru doors)    Recommendations for Other Services OT consult;Rehab consult   Frequency Min 4X/week    Precautions / Restrictions Precautions Precautions: Fall Required Braces or Orthoses: Other Brace/Splint Other Brace/Splint: pt normally uses Lt knee brace at home (does not have here; elastic with 2 velcro straps above and below knee)   Pertinent Vitals/Pain "not much" at neck; Lt knee with most pain; ace wrap applied prior to standing/walking      Mobility  Bed Mobility Bed Mobility: Rolling Right;Right Sidelying to Sit;Sitting - Scoot to Edge of Bed Rolling Right: 5: Supervision;With rail Right Sidelying to Sit: 5:  Supervision;With rails Sitting - Scoot to Edge of Bed: 5: Supervision Transfers Transfers: Sit to Stand;Stand to Sit Sit to Stand: 4: Min assist;With upper extremity assist;From elevated surface Stand to Sit: 4: Min guard;With upper extremity assist Details for Transfer Assistance: pt used to standing from her tall bed with hands on RW, so replicated today; vc for safe hand placement Ambulation/Gait Ambulation/Gait Assistance: 3: Mod assist Ambulation Distance (Feet): 25 Feet Assistive device: Rolling walker Ambulation/Gait Assistance Details: vc for sequencing due to left leg weakness; assist to maintain upright posture (LUE tends to give at elbow as she advances RLE and flexes at hips) Gait Pattern: Step-to pattern;Decreased stride length;Left genu recurvatum;Trunk flexed;Wide base of support Gait velocity: very slow and labored    Exercises General Exercises - Lower Extremity Ankle Circles/Pumps: AROM;Both;5 reps Heel Slides: AROM;Both;5 reps;Supine   PT Diagnosis: Difficulty walking;Acute pain;Generalized weakness  PT Problem List: Decreased strength;Decreased activity tolerance;Decreased mobility;Decreased knowledge of use of DME;Pain PT Treatment Interventions: DME instruction;Gait training;Stair training;Functional mobility training;Therapeutic activities;Therapeutic exercise;Patient/family education     PT Goals(Current goals can be found in the care plan section) Acute Rehab PT Goals Patient Stated Goal: get strong enough to walk and go home PT Goal Formulation: With patient Time For Goal Achievement: 08/26/13 Potential to Achieve Goals: Good  Visit Information  Last PT Received On: 08/19/13 Assistance Needed: +1 History of Present Illness: Pt admit with CSF leak after C4-5 corpectomy and C6-7 ACF. in July 2014. Now s/p evacuation and lumbar drain (now d/c'd)       Prior Functioning  Home Living Family/patient expects  to be discharged to:: Private residence Living  Arrangements: Children Available Help at Discharge: Family;Available PRN/intermittently (son works days ) Type of Home: House Home Access: Stairs to enter Technical brewer of Steps: 3 Entrance Stairs-Rails: Right Home Layout: One level Home Equipment: Environmental consultant - 2 wheels;Cane - single point;Bedside commode Prior Function Level of Independence: Independent with assistive device(s) Comments: used cane due to arthritis Lt knee worse than Rt Communication Communication: No difficulties    Cognition  Cognition Arousal/Alertness: Awake/alert Behavior During Therapy: WFL for tasks assessed/performed Overall Cognitive Status: Within Functional Limits for tasks assessed    Extremity/Trunk Assessment Upper Extremity Assessment Upper Extremity Assessment: LUE deficits/detail;Defer to OT evaluation LUE Deficits / Details: triceps 3+, Lower Extremity Assessment Lower Extremity Assessment: LLE deficits/detail;Generalized weakness LLE Deficits / Details: genu valgus due to arthritis; + crepitus; knee extension 3+ (also limited by pain); AROM WFL Cervical / Trunk Assessment Cervical / Trunk Assessment: Other exceptions Cervical / Trunk Exceptions: ACDF incision healing   Balance    End of Session PT - End of Session Equipment Utilized During Treatment: Gait belt Activity Tolerance: Patient tolerated treatment well;Patient limited by fatigue Patient left: in chair;with call bell/phone within reach Nurse Communication: Mobility status  GP     Shaunta Oncale 08/19/2013, 3:10 PM Pager [phone]

## 2013-08-19 NOTE — Progress Notes (Addendum)
[facility] Progress Note [place] TEAM 1 - Stepdown/ICU TEAM   HAIDY RESCH [id number] DOB: 07/08/1956 DOA: 08/07/2013 PCP: Rosita Fire, MD  Admit HPI / Brief Narrative: 57 year old female who initially was admitted to [facility] for dysphagia.  Patient recently had a C-spine surgery prior to this admission.  At [facility] she was seen by GI and a CT scan of the neck revealed a large CSF leak from her recent C-spine surgery compressing her esophagus. She was then transferred to [facility] on day 5 of her hospital stay.  She was seen by Neurosurgery and underwent CSF leak decompression on 08/12/2013 by Dr. Joya Salm.  Assessment/Plan:  Severe dysphagia - acutely exacerbated by large CSF leak/paraspinal collection  The NS who knew the pt previously reports that she was experiencing dysphagia/vomiting PRIOR to her surgery - review of records reveals GI notation of pt compalining of difficulty swallowing solids back in 2009, at which time plan was to obtain EGD, though this was not completed - I suspect pt may have an esophageal ring - acute worsening likely related to CSF leak, but ?not the underlying etio - pt cleared for regular diet with thin liquids per SLP and is tolerating - she reported that previously solids would "feel like they are sticking on the way down" but she has not noted that lately - will consider barium esophagram if sx recur, but for now pt having no issues   Large CSF leak and paraspinal collection As per Neurosurgery - drain now out   Nausea/ vomiting stopped Colchicine - symptoms resolved  KLEBSIELLA PNEUMONIAE pyelonephritis Proven to be sensitive to rocephin - has completed 10 day course of tx - NS continued Rocephin additional 2 days until drain removed  Progressive renal insufficiency / acute renal failure crt gradually and consistently climbing - renal US w/o acute findings, but does note atrophic L kidney - gently hydrate today and  recheck in AM - avoid potential nephrotoxins - stopped colchicine - repeat UA unrevealing   Hypertension BP controlled  Hypokalemia K+ stable 10/5 - recheck in AM  Gout Now off med tx due to advancing renal failure - if flairs, prednisone will be only option for tx - hope to avoid   Tooth pain - ?abscess lower left incisor - cont abx for now - check orthopantogram to r/o abscess that would need drainage/extraction  Hep C positive F/u LFTs in AM   Code Status: FULL Family Communication: no family present at time of exam Disposition Plan: transfer to med bed - begin PT/OT - follow diet tolerance - follow renal function - if all goes well, possible D/C in 48-72hrs?  Consultants: Neurosurgery  Procedures: C-spine CSF collection drained by Neurosurgery on 08/12/2013 (Dr Joya Salm)  Antibiotics: Rocephin 9/21 >> 9/30 + 10/2 >> 10/3  DVT prophylaxis: SCDs  HPI/Subjective: Continues eating without dysphagia. Tooth and gum pain improved.  Denies cp, sob, ha, f/c, or abdom pain.  Says she feels "very weak."  Objective: Blood pressure 116/73, pulse 86, temperature 98.1 F (36.7 C), temperature source Oral, resp. rate 13, height 5\' 2"  (1.575 m), weight 74.8 kg (164 lb 14.5 oz), SpO2 99.00%.  Intake/Output Summary (Last 24 hours) at 08/19/13 1046 Last data filed at 08/19/13 1000  Gross per 24 hour  Intake   2870 ml  Output   1967 ml  Net    903 ml   Exam: General: No acute respiratory distress Lungs: Clear to auscultation bilaterally without wheezes or crackles Cardiovascular:  Regular rate and rhythm without murmur gallop or rub  Abdomen: Nontender, nondistended, soft, bowel sounds positive, no rebound, no ascites, no appreciable mass Extremities: No significant cyanosis, clubbing, edema bilateral lower extremities  Data Reviewed: Basic Metabolic Panel:  Recent Labs Lab 08/14/13 0550 08/15/13 0355 08/16/13 0415 08/17/13 0520 08/18/13 0443  NA 137 137 140 137 141  K  3.5 3.1* 2.9* 3.9 3.5  CL 104 99 104 102 107  CO2 22 22 24 20 22   GLUCOSE 132* 83 100* 75 100*  BUN 7 8 9 11 13   CREATININE 0.93 1.11* 1.30* 1.24* 1.37*  CALCIUM 8.1* 8.5 8.3* 8.5 8.2*  MG  --   --   --  1.0*  --    Liver Function Tests: No results found for this basename: AST, ALT, ALKPHOS, BILITOT, PROT, ALBUMIN,  in the last 168 hours  CBC:  Recent Labs Lab 08/13/13 0410 08/14/13 0550 08/15/13 0355 08/16/13 0415 08/17/13 0520  WBC 6.1 12.5* 11.5* 7.7 8.4  HGB 11.2* 10.9* 11.3* 11.4* 11.8*  HCT 32.9* 31.9* 31.8* 33.3* 34.7*  MCV 79.1 78.6 77.9* 78.7 79.0  PLT 329 318 332 299 312    Recent Results (from the past 240 hour(s))  CULTURE, BLOOD (ROUTINE X 2)     Status: None   Collection Time    08/09/13  3:37 PM      Result Value Range Status   Specimen Description BLOOD RIGHT HAND   Final   Special Requests     Final   Value: BOTTLES DRAWN AEROBIC AND ANAEROBIC AEB=15CC ANA=12CC   Culture NO GROWTH 6 DAYS   Final   Report Status 08/15/2013 FINAL   Final  CULTURE, BLOOD (ROUTINE X 2)     Status: None   Collection Time    08/09/13  3:40 PM      Result Value Range Status   Specimen Description BLOOD LEFT ANTECUBITAL   Final   Special Requests     Final   Value: BOTTLES DRAWN AEROBIC AND ANAEROBIC AEB=12CC ANA=8CC   Culture NO GROWTH 6 DAYS   Final   Report Status 08/15/2013 FINAL   Final  MRSA PCR SCREENING     Status: None   Collection Time    08/13/13  2:05 AM      Result Value Range Status   MRSA by PCR NEGATIVE  NEGATIVE Final   Comment:            The GeneXpert MRSA Assay (FDA     approved for NASAL specimens     only), is one component of a     comprehensive MRSA colonization     surveillance program. It is not     intended to diagnose MRSA     infection nor to guide or     monitor treatment for     MRSA infections.     Studies:  Recent x-ray studies have been reviewed in detail by the Attending Physician  Scheduled Meds:  Scheduled Meds: .  amLODipine  10 mg Oral Daily  . bethanechol  25 mg Oral TID  . bisacodyl  10 mg Oral Daily  . cefTRIAXone (ROCEPHIN)  IV  1 g Intravenous Q24H  . feeding supplement  237 mL Oral BID BM  . metronidazole  500 mg Intravenous Q8H  . ondansetron  4 mg Oral TID AC  . pantoprazole  40 mg Oral BID AC  . polyethylene glycol  17 g Oral Daily  . tamsulosin  0.4  mg Oral Daily    Time spent on care of this patient: 35 mins   Corina Stacy T, MD  [facility] Office  [phone] Pager - Text Page per Shea Evans as per below:  On-Call/Text Page:      [email or web address]      password [email or web address]  If 7PM-7AM, please contact night-coverage [email or web address] Password [email or web address] 08/19/2013, 10:46 AM   LOS: 12 days

## 2013-08-20 DIAGNOSIS — N182 Chronic kidney disease, stage 2 (mild): Secondary | ICD-10-CM

## 2013-08-20 LAB — BASIC METABOLIC PANEL
BUN: 16 mg/dL (ref 6–23)
CO2: 23 mEq/L (ref 19–32)
Chloride: 106 mEq/L (ref 96–112)
Creatinine, Ser: 1.17 mg/dL — ABNORMAL HIGH (ref 0.50–1.10)
GFR calc Af Amer: 59 mL/min — ABNORMAL LOW (ref >90)
Glucose, Bld: 108 mg/dL — ABNORMAL HIGH (ref 70–99)
Potassium: 3.8 mEq/L (ref 3.5–5.1)
Sodium: 141 mEq/L (ref 135–145)

## 2013-08-20 LAB — CBC
HCT: 28.2 % — ABNORMAL LOW (ref 36.0–46.0)
Hemoglobin: 9.6 g/dL — ABNORMAL LOW (ref 12.0–15.0)
RDW: 15 % (ref 11.5–15.5)
WBC: 6.1 10*3/uL (ref 4.0–10.5)

## 2013-08-20 MED ORDER — AMOXICILLIN-POT CLAVULANATE 400-57 MG/5ML PO SUSR
875.0000 mg | Freq: Two times a day (BID) | ORAL | Status: DC
  Administered 2013-08-20 – 2013-08-22 (×4): 875 mg via ORAL
  Filled 2013-08-20 (×6): qty 10.9

## 2013-08-20 MED ORDER — AMOXICILLIN-POT CLAVULANATE 875-125 MG PO TABS
1.0000 | ORAL_TABLET | Freq: Two times a day (BID) | ORAL | Status: DC
  Administered 2013-08-20: 1 via ORAL
  Filled 2013-08-20 (×2): qty 1

## 2013-08-20 NOTE — Progress Notes (Signed)
Physical Therapy Treatment Patient Details Name: Colleen Nunez MRN: [id number] DOB: 03/14/1956 Today's Date: 08/20/2013 Time: [id number] PT Time Calculation (min): 26 min  PT Assessment / Plan / Recommendation  History of Present Illness Pt admit with CSF leak after C4-5 corpectomy and C6-7 ACF. in July 2014. Now s/p evacuation and lumbar drain (now d/c'd)   PT Comments   Pt ambulated this session however unable to ambulate as far however with less assistance.  Pt continues to fatigue easily and limited due to left knee pain during this session.    Follow Up Recommendations  CIR (pt adamant about ultimate d/c to home and does not want SNF)     Recommendations for Other Services OT consult;Rehab consult  Frequency Min 4X/week   Progress towards PT Goals Progress towards PT goals: Progressing toward goals (slowly)  Plan Current plan remains appropriate    Precautions / Restrictions Precautions Precautions: Fall Required Braces or Orthoses: Other Brace/Splint Other Brace/Splint: pt normally uses Lt knee brace at home (does not have here; elastic with 2 velcro straps above and below knee) Restrictions Weight Bearing Restrictions: No   Pertinent Vitals/Pain C/o left knee pain but does not rate    Mobility  Bed Mobility Rolling Left: 5: Supervision Supine to Sit: 5: Supervision Transfers Transfers: Sit to Stand;Stand to Sit Sit to Stand: 4: Min assist;With upper extremity assist;From elevated surface;From bed;From toilet Stand to Sit: 4: Min guard;With upper extremity assist;To chair/3-in-1;To toilet Details for Transfer Assistance: VCs for hand placement especially from commode. Ambulation/Gait Ambulation/Gait Assistance: 4: Min assist;3: Mod assist Ambulation Distance (Feet): 15 Feet Assistive device: Rolling walker Ambulation/Gait Assistance Details: (A) to manage RW with max cues for upright posture, step sequence and body position within RW.  Pt tends to ambulate to close  to RW.  Pt continues to stay in flexed position during ambulation.  Occasional mod (A) needed due to left knee buckle.  Gait Pattern: Step-to pattern;Decreased stride length;Left genu recurvatum;Trunk flexed;Wide base of support Gait velocity: very slow and labored General Gait Details: pt now with significant pain in left knee with weight bearing.  Her feet/ankles are back to normal by her report.  Her knee appears to be at end stage DJD with audible bone crepittus with flexion. Stairs: No    Exercises General Exercises - Lower Extremity Gluteal Sets: Strengthening;5 reps Long Arc Quad: AROM;Both;10 reps;Seated Hip ABduction/ADduction: Strengthening;Both;10 reps;Seated (pillow squeeze) Hip Flexion/Marching: Strengthening;Both;10 reps   PT Diagnosis:    PT Problem List:   PT Treatment Interventions:     PT Goals (current goals can now be found in the care plan section) Acute Rehab PT Goals Patient Stated Goal: get strong enough to walk and go home PT Goal Formulation: With patient Time For Goal Achievement: 08/26/13 Potential to Achieve Goals: Good  Visit Information  Last PT Received On: 08/20/13 Assistance Needed: +1 History of Present Illness: Pt admit with CSF leak after C4-5 corpectomy and C6-7 ACF. in July 2014. Now s/p evacuation and lumbar drain (now d/c'd)    Subjective Data  Subjective: "Lets go.  I'm ready to get OOB." Patient Stated Goal: get strong enough to walk and go home   Cognition  Cognition Arousal/Alertness: Awake/alert Behavior During Therapy: WFL for tasks assessed/performed Overall Cognitive Status: Within Functional Limits for tasks assessed    Balance     End of Session PT - End of Session Equipment Utilized During Treatment: Gait belt Activity Tolerance: Patient tolerated treatment well;Patient limited by fatigue Patient left:  in chair;with call bell/phone within reach Nurse Communication: Mobility status   GP     Emmalyn Hinson 08/20/2013,  1:20 PM Antoine Poche, [place] DPT [phone]

## 2013-08-20 NOTE — Progress Notes (Signed)
[facility] PROGRESS NOTE  Colleen Nunez [id number] DOB: 08-17-1956 DOA: 08/07/2013 PCP: Rosita Fire, MD  Assessment/Plan: 57 year old female who initially was admitted to [facility] for dysphagia. Patient recently had a C-spine surgery prior to this admission. At [facility] she was seen by GI and a CT scan of the neck revealed a large CSF leak from her recent C-spine surgery compressing her esophagus. She was then transferred to [facility] on day 5 of her hospital stay. She was seen by Neurosurgery and underwent CSF leak decompression on 08/12/2013 by Dr. Joya Salm.   1-Severe dysphagia - acutely exacerbated by large CSF leak/paraspinal collection  The NS who knew the pt previously reports that she was experiencing dysphagia/vomiting PRIOR to her surgery - review of records reveals GI notation of pt compalining of difficulty swallowing solids back in 2009, at which time plan was to obtain EGD, though this was not completed - I suspect pt may have an esophageal ring - acute worsening likely related to CSF leak, but ?not the underlying etio - pt cleared for regular diet with thin liquids per SLP and is tolerating.  -Tolerating Diet.  -Will consider barium esophagram if sx recur, but for now pt having no issues   Large CSF leak and paraspinal collection  As per Neurosurgery - drain now out.  No need to continue with antibiotics.   Nausea/ vomiting  stopped Colchicine - symptoms resolved   KLEBSIELLA PNEUMONIAE pyelonephritis  Proven to be sensitive to rocephin - has completed 10 day course of tx - NS continued Rocephin additional 2 days until drain removed   Progressive renal insufficiency / acute renal failure  crt gradually and consistently climbing - renal US w/o acute findings, but does note atrophic L kidney - gently hydrate today and recheck in AM - avoid potential nephrotoxins - stopped colchicine - repeat UA unrevealing  Improving. Cr 1.1.  Hypertension   BP controlled   Hypokalemia  Resolved.   Gout  Now off med tx due to advancing renal failure - if flairs, prednisone will be only option for tx - hope to avoid   Tooth pain - ?abscess  Lower left incisor - cont abx for now - Orthopantogram: Very limited evaluation by orthopantomogram due to artifact created by blurring of cervical spine fusion hardware. The mandibular teeth  cannot be adequately assessed. Will order Augmentin.  would need drainage/extraction at some point.   Hep C positive  F/u LFTs in AM    Code Status: Full Code.  Family Communication: Care discussed with Patient.  Disposition Plan: CIR consulted.    Consultants:  Dr Luiz Ochoa.   Procedures: Orthopantogram: Very limited evaluation by orthopantomogram due to artifact created by blurring of cervical spine fusion hardware. The mandibular teeth  cannot be adequately assessed.  Renal US: On the previous CT examination atrophic left kidney was identified.  On the current examination the left kidney cannot be identified.  No right hydronephrosis or right renal mass is evident. There is  some increased echogenicity of the renal parenchyma with decrease in  differentiation between cortex and medulla consistent with medical  renal disease.   Antibiotics: Rocephin 9/21 >> 9/30 + 10/2 >> 10/3   HPI/Subjective: Relates she is swallowing better. Tolerating diet.  Relates teeth pain is better.   Objective: Filed Vitals:   08/20/13 0521  BP: 114/72  Pulse: 93  Temp: 97.7 F (36.5 C)  Resp: 18    Intake/Output Summary (Last 24 hours) at 08/20/13 0851 Last  data filed at 08/20/13 0500  Gross per 24 hour  Intake   1475 ml  Output    250 ml  Net   1225 ml   Filed Weights   08/07/13 2238 08/13/13 0130 08/19/13 1637  Weight: 86.5 kg (190 lb 11.2 oz) 74.8 kg (164 lb 14.5 oz) 77.4 kg (170 lb 10.2 oz)    Exam:   General:  No distress.   Cardiovascular: S 1, S 2 RRR  Respiratory: CTA  Abdomen: BS  present, soft, NT, ND  Musculoskeletal: no edema.   Data Reviewed: Basic Metabolic Panel:  Recent Labs Lab 08/15/13 0355 08/16/13 0415 08/17/13 0520 08/18/13 0443 08/20/13 0550  NA 137 140 137 141 141  K 3.1* 2.9* 3.9 3.5 3.8  CL 99 104 102 107 106  CO2 22 24 20 22 23   GLUCOSE 83 100* 75 100* 108*  BUN 8 9 11 13 16   CREATININE 1.11* 1.30* 1.24* 1.37* 1.17*  CALCIUM 8.5 8.3* 8.5 8.2* 8.1*  MG  --   --  1.0*  --   --    CBC:  Recent Labs Lab 08/14/13 0550 08/15/13 0355 08/16/13 0415 08/17/13 0520 08/20/13 0550  WBC 12.5* 11.5* 7.7 8.4 6.1  HGB 10.9* 11.3* 11.4* 11.8* 9.6*  HCT 31.9* 31.8* 33.3* 34.7* 28.2*  MCV 78.6 77.9* 78.7 79.0 79.0  PLT 318 332 299 312 211   Cardiac Enzymes: No results found for this basename: CKTOTAL, CKMB, CKMBINDEX, TROPONINI,  in the last 168 hours BNP (last 3 results) No results found for this basename: PROBNP,  in the last 8760 hours CBG: No results found for this basename: GLUCAP,  in the last 168 hours  Recent Results (from the past 240 hour(s))  MRSA PCR SCREENING     Status: None   Collection Time    08/13/13  2:05 AM      Result Value Range Status   MRSA by PCR NEGATIVE  NEGATIVE Final   Comment:            The GeneXpert MRSA Assay (FDA     approved for NASAL specimens     only), is one component of a     comprehensive MRSA colonization     surveillance program. It is not     intended to diagnose MRSA     infection nor to guide or     monitor treatment for     MRSA infections.     Studies: Dg Orthopantogram  08/19/2013   CLINICAL DATA:  Possible dental abscess and pain of a left lower incisor.  EXAM: ORTHOPANTOGRAM/PANORAMIC  COMPARISON:  None.  FINDINGS: Evaluation of the lower teeth is severely compromised by blurring of cervical spine fusion hardware after prior extensive cervical fusion and cervical corpectomy. The study is nondiagnostic in evaluating for possible dental abscess of the lower teeth. Dedicated dental  spot films would be necessary to accurately evaluate the teeth. No obvious dental pathology is seen involving the upper teeth. No obvious bony destruction is identified involving the visualized mandible or maxilla.  IMPRESSION: Very limited evaluation by orthopantomogram due to artifact created by blurring of cervical spine fusion hardware. The mandibular teeth cannot be adequately assessed.   Electronically Signed   By: Aletta Edouard M.D.   On: 08/19/2013 14:28   US Renal Port  08/18/2013   CLINICAL DATA:  History of acute renal failure on chronic stage IV renal disease. History of urinary tract infections. History of atrophic left kidney.  EXAM:  RENAL/URINARY TRACT ULTRASOUND COMPLETE  COMPARISON:  CT 08/11/2013.  FINDINGS: Right Kidney  Length: Right renal length is 9.3 cm. Examination of the right kidney shows no evidence of hydronephrosis, solid or cystic mass, calculus, or parenchymal loss. There is some increased echogenicity of the renal parenchyma with decrease in differentiation between cortex and medulla consistent with medical renal disease.  Left Kidney  On the previous CT examination atrophic left kidney was identified. On the current examination the left kidney cannot be identified.  Bladder:  Appears normal for degree of bladder distention.  IMPRESSION: On the previous CT examination atrophic left kidney was identified. On the current examination the left kidney cannot be identified.  No right hydronephrosis or right renal mass is evident. There is some increased echogenicity of the renal parenchyma with decrease in differentiation between cortex and medulla consistent with medical renal disease.   Electronically Signed   By: Shanon Brow  Call M.D.   On: 08/18/2013 15:31    Scheduled Meds: . amLODipine  10 mg Oral Daily  . bethanechol  25 mg Oral TID  . bisacodyl  10 mg Oral Daily  . feeding supplement  237 mL Oral BID BM  . ondansetron  4 mg Oral TID AC  . pantoprazole  40 mg Oral BID AC  .  polyethylene glycol  17 g Oral Daily  . tamsulosin  0.4 mg Oral Daily   Continuous Infusions: . sodium chloride 75 mL/hr (08/19/13 1838)    Principal Problem:   UTI (lower urinary tract infection) Active Problems:   ANEMIA, NORMOCYTIC   Nausea & vomiting   Chronic kidney disease, stage IV (severe)   Cervical myelopathy   Hypokalemia   AKI (acute kidney injury)    Time spent: 35 minutes.     Eithel Ryall  [facility] Pager [phone]. If 7PM-7AM, please contact night-coverage at [email or web address], password [facility] 08/20/2013, 8:51 AM  LOS: 13 days

## 2013-08-20 NOTE — Progress Notes (Signed)
Doing well.   Temp:  [97.6 F (36.4 C)-98.1 F (36.7 C)] 97.7 F (36.5 C) (10/04 0521) Pulse Rate:  [86-100] 93 (10/04 0521) Resp:  [13-18] 18 (10/04 0521) BP: (110-120)/(66-76) 114/72 mmHg (10/04 0521) SpO2:  [98 %-100 %] 100 % (10/04 0521) Weight:  [77.4 kg (170 lb 10.2 oz)] 77.4 kg (170 lb 10.2 oz) (10/03 1637) Incision flat  Plan: Drain out , increasing activity  - so far no reaccumulation of fluid   -  CMP-  will recheck Monday if still here

## 2013-08-21 DIAGNOSIS — M4712 Other spondylosis with myelopathy, cervical region: Secondary | ICD-10-CM

## 2013-08-21 NOTE — Progress Notes (Signed)
[facility] PROGRESS NOTE  Colleen Nunez [id number] DOB: March 16, 1956 DOA: 08/07/2013 PCP: Rosita Fire, MD  Assessment/Plan: 57 year old female who initially was admitted to [facility] for dysphagia. Patient recently had a C-spine surgery prior to this admission. At [facility] she was seen by GI and a CT scan of the neck revealed a large CSF leak from her recent C-spine surgery compressing her esophagus. She was then transferred to [facility] on day 5 of her hospital stay. She was seen by Neurosurgery and underwent CSF leak decompression on 08/12/2013 by Dr. Joya Salm.   1-Severe dysphagia - acutely exacerbated by large CSF leak/paraspinal collection  The NS who knew the pt previously reports that she was experiencing dysphagia/vomiting PRIOR to her surgery - review of records reveals GI notation of pt compalining of difficulty swallowing solids back in 2009, at which time plan was to obtain EGD, though this was not completed - I suspect pt may have an esophageal ring - acute worsening likely related to CSF leak, but ? not the underlying etio - pt cleared for regular diet with thin liquids per SLP and is tolerating. -Tolerating Diet.  -Will consider barium esophagram if sx recur, but for now pt having no issues   Large CSF leak and paraspinal collection  As per Neurosurgery - drain now out.  No need to continue with antibiotics.   Nausea/ vomiting  stopped Colchicine - symptoms resolved   KLEBSIELLA PNEUMONIAE pyelonephritis  Proven to be sensitive to rocephin - has completed 10 day course of tx - NS continued Rocephin additional 2 days until drain removed.   Progressive renal insufficiency / acute renal failure  crt gradually and consistently climbing - renal US w/o acute findings, but does note atrophic L kidney - gently hydrate today and recheck in AM - avoid potential nephrotoxins - stopped colchicine - repeat UA unrevealing  Improving. Cr 1.1.  Hypertension   BP controlled   Hypokalemia  Resolved.   Gout  Now off med tx due to advancing renal failure - if flairs, prednisone will be only option for tx - hope to avoid   Tooth pain - ?abscess  Lower left incisor - cont abx for now - Orthopantogram: Very limited evaluation by orthopantomogram due to artifact created by blurring of cervical spine fusion hardware. The mandibular teeth  cannot be adequately assessed. Continue with  Augmentin.  would need /extraction at some point.   Hep C positive  F/u LFTs in AM    Code Status: Full Code.  Family Communication: Care discussed with Patient.  Disposition Plan: CIR consulted. Waiting for CIR evaluation/    Consultants:  Dr Luiz Ochoa.   Procedures: Orthopantogram: Very limited evaluation by orthopantomogram due to artifact created by blurring of cervical spine fusion hardware. The mandibular teeth  cannot be adequately assessed.  Renal US: On the previous CT examination atrophic left kidney was identified.  On the current examination the left kidney cannot be identified.  No right hydronephrosis or right renal mass is evident. There is  some increased echogenicity of the renal parenchyma with decrease in  differentiation between cortex and medulla consistent with medical  renal disease.   Antibiotics: Rocephin 9/21 >> 9/30 + 10/2 >> 10/3   HPI/Subjective: Tolerating diet.  Relates teeth pain is better.   Objective: Filed Vitals:   08/21/13 1043  BP: 125/64  Pulse:   Temp:   Resp:     Intake/Output Summary (Last 24 hours) at 08/21/13 1142 Last data filed at  08/21/13 0900  Gross per 24 hour  Intake   1710 ml  Output      0 ml  Net   1710 ml   Filed Weights   08/07/13 2238 08/13/13 0130 08/19/13 1637  Weight: 86.5 kg (190 lb 11.2 oz) 74.8 kg (164 lb 14.5 oz) 77.4 kg (170 lb 10.2 oz)    Exam:   General:  No distress.   Cardiovascular: S 1, S 2 RRR  Respiratory: CTA  Abdomen: BS present, soft, NT,  ND  Musculoskeletal: no edema.   Data Reviewed: Basic Metabolic Panel:  Recent Labs Lab 08/15/13 0355 08/16/13 0415 08/17/13 0520 08/18/13 0443 08/20/13 0550  NA 137 140 137 141 141  K 3.1* 2.9* 3.9 3.5 3.8  CL 99 104 102 107 106  CO2 22 24 20 22 23   GLUCOSE 83 100* 75 100* 108*  BUN 8 9 11 13 16   CREATININE 1.11* 1.30* 1.24* 1.37* 1.17*  CALCIUM 8.5 8.3* 8.5 8.2* 8.1*  MG  --   --  1.0*  --   --    CBC:  Recent Labs Lab 08/15/13 0355 08/16/13 0415 08/17/13 0520 08/20/13 0550  WBC 11.5* 7.7 8.4 6.1  HGB 11.3* 11.4* 11.8* 9.6*  HCT 31.8* 33.3* 34.7* 28.2*  MCV 77.9* 78.7 79.0 79.0  PLT 332 299 312 211   Cardiac Enzymes: No results found for this basename: CKTOTAL, CKMB, CKMBINDEX, TROPONINI,  in the last 168 hours BNP (last 3 results) No results found for this basename: PROBNP,  in the last 8760 hours CBG: No results found for this basename: GLUCAP,  in the last 168 hours  Recent Results (from the past 240 hour(s))  MRSA PCR SCREENING     Status: None   Collection Time    08/13/13  2:05 AM      Result Value Range Status   MRSA by PCR NEGATIVE  NEGATIVE Final   Comment:            The GeneXpert MRSA Assay (FDA     approved for NASAL specimens     only), is one component of a     comprehensive MRSA colonization     surveillance program. It is not     intended to diagnose MRSA     infection nor to guide or     monitor treatment for     MRSA infections.     Studies: Dg Orthopantogram  08/19/2013   CLINICAL DATA:  Possible dental abscess and pain of a left lower incisor.  EXAM: ORTHOPANTOGRAM/PANORAMIC  COMPARISON:  None.  FINDINGS: Evaluation of the lower teeth is severely compromised by blurring of cervical spine fusion hardware after prior extensive cervical fusion and cervical corpectomy. The study is nondiagnostic in evaluating for possible dental abscess of the lower teeth. Dedicated dental spot films would be necessary to accurately evaluate the  teeth. No obvious dental pathology is seen involving the upper teeth. No obvious bony destruction is identified involving the visualized mandible or maxilla.  IMPRESSION: Very limited evaluation by orthopantomogram due to artifact created by blurring of cervical spine fusion hardware. The mandibular teeth cannot be adequately assessed.   Electronically Signed   By: Aletta Edouard M.D.   On: 08/19/2013 14:28    Scheduled Meds: . amLODipine  10 mg Oral Daily  . amoxicillin-clavulanate  875 mg Oral Q12H  . bethanechol  25 mg Oral TID  . bisacodyl  10 mg Oral Daily  . feeding supplement  237 mL  Oral BID BM  . ondansetron  4 mg Oral TID AC  . pantoprazole  40 mg Oral BID AC  . polyethylene glycol  17 g Oral Daily  . tamsulosin  0.4 mg Oral Daily   Continuous Infusions:    Principal Problem:   Cervical myelopathy Active Problems:   ANEMIA, NORMOCYTIC   UTI (lower urinary tract infection)   Nausea & vomiting   Chronic kidney disease, stage IV (severe)   Hypokalemia   AKI (acute kidney injury)    Time spent: 25 minutes.     Colleen Nunez  [facility] Pager [phone]. If 7PM-7AM, please contact night-coverage at [email or web address], password [facility] 08/21/2013, 11:42 AM  LOS: 14 days

## 2013-08-22 ENCOUNTER — Inpatient Hospital Stay (HOSPITAL_COMMUNITY): Payer: Medicaid Other

## 2013-08-22 ENCOUNTER — Inpatient Hospital Stay (HOSPITAL_COMMUNITY)
Admission: RE | Admit: 2013-08-22 | Discharge: 2013-09-02 | DRG: 945 | Disposition: A | Discharge status: Home / Self Care | Payer: Medicaid Other | Source: Intra-hospital | Attending: Physical Medicine & Rehabilitation | Admitting: Physical Medicine & Rehabilitation

## 2013-08-22 DIAGNOSIS — K089 Disorder of teeth and supporting structures, unspecified: Secondary | ICD-10-CM | POA: Diagnosis present

## 2013-08-22 DIAGNOSIS — F329 Major depressive disorder, single episode, unspecified: Secondary | ICD-10-CM | POA: Diagnosis present

## 2013-08-22 DIAGNOSIS — Z5189 Encounter for other specified aftercare: Principal | ICD-10-CM

## 2013-08-22 DIAGNOSIS — R1310 Dysphagia, unspecified: Secondary | ICD-10-CM | POA: Diagnosis present

## 2013-08-22 DIAGNOSIS — N182 Chronic kidney disease, stage 2 (mild): Secondary | ICD-10-CM | POA: Diagnosis present

## 2013-08-22 DIAGNOSIS — I959 Hypotension, unspecified: Secondary | ICD-10-CM | POA: Diagnosis not present

## 2013-08-22 DIAGNOSIS — K592 Neurogenic bowel, not elsewhere classified: Secondary | ICD-10-CM | POA: Diagnosis present

## 2013-08-22 DIAGNOSIS — I10 Essential (primary) hypertension: Secondary | ICD-10-CM

## 2013-08-22 DIAGNOSIS — M109 Gout, unspecified: Secondary | ICD-10-CM | POA: Diagnosis present

## 2013-08-22 DIAGNOSIS — G959 Disease of spinal cord, unspecified: Secondary | ICD-10-CM

## 2013-08-22 DIAGNOSIS — K5900 Constipation, unspecified: Secondary | ICD-10-CM

## 2013-08-22 DIAGNOSIS — M4802 Spinal stenosis, cervical region: Secondary | ICD-10-CM | POA: Diagnosis present

## 2013-08-22 DIAGNOSIS — F3289 Other specified depressive episodes: Secondary | ICD-10-CM

## 2013-08-22 DIAGNOSIS — F411 Generalized anxiety disorder: Secondary | ICD-10-CM

## 2013-08-22 DIAGNOSIS — M4712 Other spondylosis with myelopathy, cervical region: Secondary | ICD-10-CM | POA: Diagnosis present

## 2013-08-22 DIAGNOSIS — N319 Neuromuscular dysfunction of bladder, unspecified: Secondary | ICD-10-CM | POA: Diagnosis present

## 2013-08-22 DIAGNOSIS — K219 Gastro-esophageal reflux disease without esophagitis: Secondary | ICD-10-CM | POA: Diagnosis present

## 2013-08-22 DIAGNOSIS — Z981 Arthrodesis status: Secondary | ICD-10-CM

## 2013-08-22 DIAGNOSIS — M1710 Unilateral primary osteoarthritis, unspecified knee: Secondary | ICD-10-CM | POA: Diagnosis present

## 2013-08-22 LAB — URINALYSIS, ROUTINE W REFLEX MICROSCOPIC
Bilirubin Urine: NEGATIVE
Glucose, UA: NEGATIVE mg/dL
Ketones, ur: NEGATIVE mg/dL
Urobilinogen, UA: 0.2 mg/dL (ref 0.0–1.0)
pH: 6 (ref 5.0–8.0)

## 2013-08-22 MED ORDER — BETHANECHOL CHLORIDE 25 MG PO TABS
25.0000 mg | ORAL_TABLET | Freq: Three times a day (TID) | ORAL | Status: DC
  Administered 2013-08-22 – 2013-08-23 (×2): 25 mg via ORAL
  Filled 2013-08-22 (×5): qty 1

## 2013-08-22 MED ORDER — GUAIFENESIN-DM 100-10 MG/5ML PO SYRP: 5.0000 mL | ORAL_SOLUTION | Freq: Four times a day (QID) | ORAL | Status: DC | PRN

## 2013-08-22 MED ORDER — OXYCODONE-ACETAMINOPHEN 5-325 MG PO TABS
1.0000 | ORAL_TABLET | ORAL | Status: DC | PRN
  Administered 2013-08-23 – 2013-08-26 (×2): 1 via ORAL
  Filled 2013-08-22 (×2): qty 1

## 2013-08-22 MED ORDER — PROCHLORPERAZINE MALEATE 5 MG PO TABS
5.0000 mg | ORAL_TABLET | Freq: Four times a day (QID) | ORAL | Status: DC | PRN
  Filled 2013-08-22: qty 2

## 2013-08-22 MED ORDER — ENSURE COMPLETE PO LIQD
237.0000 mL | Freq: Two times a day (BID) | ORAL | Status: DC
  Administered 2013-08-23 (×2): 237 mL via ORAL

## 2013-08-22 MED ORDER — POLYETHYLENE GLYCOL 3350 17 G PO PACK
17.0000 g | PACK | Freq: Every day | ORAL | Status: DC
  Administered 2013-08-24 – 2013-08-28 (×3): 17 g via ORAL
  Filled 2013-08-22 (×12): qty 1

## 2013-08-22 MED ORDER — PROCHLORPERAZINE EDISYLATE 5 MG/ML IJ SOLN
5.0000 mg | Freq: Four times a day (QID) | INTRAMUSCULAR | Status: DC | PRN
  Filled 2013-08-22: qty 2

## 2013-08-22 MED ORDER — METHOCARBAMOL 500 MG PO TABS: 500.0000 mg | ORAL_TABLET | Freq: Four times a day (QID) | ORAL | Status: DC | PRN

## 2013-08-22 MED ORDER — DICLOFENAC SODIUM 1 % TD GEL
2.0000 g | Freq: Four times a day (QID) | TRANSDERMAL | Status: DC
  Administered 2013-08-22 – 2013-09-02 (×37): 2 g via TOPICAL
  Filled 2013-08-22: qty 100

## 2013-08-22 MED ORDER — BISACODYL 10 MG RE SUPP
10.0000 mg | Freq: Every day | RECTAL | Status: DC | PRN
  Administered 2013-08-26 – 2013-08-31 (×2): 10 mg via RECTAL
  Filled 2013-08-22 (×2): qty 1

## 2013-08-22 MED ORDER — AMOXICILLIN-POT CLAVULANATE 400-57 MG/5ML PO SUSR
875.0000 mg | Freq: Two times a day (BID) | ORAL | Status: DC
  Administered 2013-08-22 – 2013-08-28 (×13): 875 mg via ORAL
  Filled 2013-08-22 (×14): qty 10.9

## 2013-08-22 MED ORDER — TRAMADOL HCL 50 MG PO TABS: 50.0000 mg | ORAL_TABLET | Freq: Four times a day (QID) | ORAL | Status: DC | PRN

## 2013-08-22 MED ORDER — PANTOPRAZOLE SODIUM 40 MG PO TBEC
40.0000 mg | DELAYED_RELEASE_TABLET | Freq: Two times a day (BID) | ORAL | Status: DC
  Administered 2013-08-22 – 2013-09-02 (×21): 40 mg via ORAL
  Filled 2013-08-22 (×26): qty 1

## 2013-08-22 MED ORDER — TAMSULOSIN HCL 0.4 MG PO CAPS
0.4000 mg | ORAL_CAPSULE | Freq: Every day | ORAL | Status: DC
  Administered 2013-08-23 – 2013-08-30 (×8): 0.4 mg via ORAL
  Filled 2013-08-22 (×11): qty 1

## 2013-08-22 MED ORDER — DIPHENHYDRAMINE HCL 12.5 MG/5ML PO ELIX
12.5000 mg | ORAL_SOLUTION | Freq: Four times a day (QID) | ORAL | Status: DC | PRN
  Filled 2013-08-22: qty 10

## 2013-08-22 MED ORDER — ACETAMINOPHEN 325 MG PO TABS: 325.0000 mg | ORAL_TABLET | ORAL | Status: DC | PRN

## 2013-08-22 MED ORDER — PROCHLORPERAZINE 25 MG RE SUPP
12.5000 mg | Freq: Four times a day (QID) | RECTAL | Status: DC | PRN
  Filled 2013-08-22: qty 1

## 2013-08-22 MED ORDER — AMLODIPINE BESYLATE 10 MG PO TABS
10.0000 mg | ORAL_TABLET | Freq: Every day | ORAL | Status: DC
  Administered 2013-08-23 – 2013-08-30 (×8): 10 mg via ORAL
  Filled 2013-08-22 (×11): qty 1

## 2013-08-22 MED ORDER — BISACODYL 10 MG RE SUPP
10.0000 mg | Freq: Once | RECTAL | Status: AC
  Administered 2013-08-22: 10 mg via RECTAL
  Filled 2013-08-22: qty 1

## 2013-08-22 MED ORDER — ENOXAPARIN SODIUM 40 MG/0.4ML ~~LOC~~ SOLN
40.0000 mg | SUBCUTANEOUS | Status: DC
  Administered 2013-08-23 – 2013-09-02 (×11): 40 mg via SUBCUTANEOUS
  Filled 2013-08-22 (×11): qty 0.4

## 2013-08-22 MED ORDER — CHLORHEXIDINE GLUCONATE 0.12 % MT SOLN
15.0000 mL | Freq: Four times a day (QID) | OROMUCOSAL | Status: DC
  Administered 2013-08-22 – 2013-09-02 (×29): 15 mL via OROMUCOSAL
  Filled 2013-08-22 (×46): qty 15

## 2013-08-22 MED ORDER — POLYETHYLENE GLYCOL 3350 17 GM/SCOOP PO POWD: 1.0000 | Freq: Two times a day (BID) | ORAL | Status: DC

## 2013-08-22 MED ORDER — FLEET ENEMA 7-19 GM/118ML RE ENEM
1.0000 | ENEMA | Freq: Once | RECTAL | Status: AC | PRN
  Filled 2013-08-22: qty 1

## 2013-08-22 MED ORDER — SACCHAROMYCES BOULARDII 250 MG PO CAPS
250.0000 mg | ORAL_CAPSULE | Freq: Two times a day (BID) | ORAL | Status: DC
  Administered 2013-08-24: 250 mg via ORAL
  Filled 2013-08-22 (×10): qty 1

## 2013-08-22 MED ORDER — TRAZODONE HCL 50 MG PO TABS
25.0000 mg | ORAL_TABLET | Freq: Every evening | ORAL | Status: DC | PRN
  Administered 2013-08-30: 25 mg via ORAL
  Filled 2013-08-22: qty 1

## 2013-08-22 MED ORDER — ALUM & MAG HYDROXIDE-SIMETH 200-200-20 MG/5ML PO SUSP: 30.0000 mL | ORAL | Status: DC | PRN

## 2013-08-22 NOTE — Interval H&P Note (Signed)
STPHANIE BOLING was admitted today to Inpatient Rehabilitation with the diagnosis of cervical myelopathy and deconditioning related to CSF leak.  The patient's history has been reviewed, patient examined, and there is no change in status.  Patient continues to be appropriate for intensive inpatient rehabilitation.  I have reviewed the patient's chart and labs.  Questions were answered to the patient's satisfaction.  Thimothy Barretta T 08/22/2013, 10:15 PM

## 2013-08-22 NOTE — Progress Notes (Signed)
Report called to [facility] RN on 4MW, pt transported via bed to Inpatient rehab for admission

## 2013-08-22 NOTE — H&P (View-Only) (Signed)
Physical Medicine and Rehabilitation Admission H&P    Chief Complaint  Patient presents with  . Cervical myelopathy with dysphagia due to post op fluid collection.  : HPI: Colleen Nunez is a 57 y.o. female with history of ACDF for stenosis with myelopathy 05/2013 and continued BLE weakness as well as recent gout flare up with left knee and ankle pain. Admitted on 08/07/13 to [facility] with N/V and difficulty swallowing. CT neck with with very large prevertebral but trans-spatial fluid collection encompassing 8.6 x 7.3 x 11.6 cm c/w a postoperative CSF leak and patient transferred to [facility] on 08/12/13. She was taken to OR the same day for evacuation of fluid collection and Insertion of spinal drai by Dr. Joya Salm. Swallow evaluation done showed no signs of dysphagia and regular textures, thin liquids recommended. Kleb Pneumo pyelonephritis treated with 10 day course of rocephin. Patient has had woresening of renal status and renal u/s with evidence of medical renal disease on right and unable to ID left kidney. Orthopanthogram done due to complaints of dental pain--limited due to cervical hardware. She was started on Augmentin on 10/04 with improvement of symptoms.  herapies initiated and patient limited by LLE pain with knee instability as well as decreased posture. CIR recommended by therapy team and patient admitted today.      Review of Systems  HENT: Positive for neck pain. Negative for hearing loss.  Eyes: Negative for blurred vision and double vision.  Respiratory: Positive for cough. Negative for shortness of breath.  Cardiovascular: Negative for chest pain and palpitations.  Gastrointestinal: Positive for heartburn and constipation. Negative for nausea.  Genitourinary: Positive for urgency and frequency.  Musculoskeletal: Positive for myalgias and joint pain (Needs left knee replaced.).  Neurological: Positive for tingling, sensory change, focal weakness and headaches (occipital  headaches--improved since ACDF.). Negative for dizziness   Past Medical History  Diagnosis Date  . Hypertension   . Arthritis   . Hepatitis C antibody test positive        . Renal insufficiency   . Anxiety   . Depression     h/o suicide attempts in the past  . GERD (gastroesophageal reflux disease)   . Polysubstance abuse     h/o  . Anemia   . Gout    Past Surgical History  Procedure Laterality Date  . Knee arthroscopy    . Abdominal hysterectomy    . Cartilage removal      left knee  . Anterior cervical corpectomy N/A 06/09/2013    Procedure: C4 C5 Corpectomy with C6-7 Anterior cervical fusion/Peek cage/Trestle plate;  Surgeon: Otilio Connors, MD;  Location: [place] [facility];  Service: Neurosurgery;  Laterality: N/A;  Cervical Four, Cervical Five Corpectomy and Cervical six-seven Anterior cervical fusion/Peek cage Three-Five /Trestle Plating Cervical Three to Cervical Seven  . X-stop implantation    . Bilateral soo and appendectomy  2006  . Anterior cervical decomp/discectomy fusion N/A 08/12/2013    Procedure: Repair of Anterior  Cervical CSF LEAK, lumbar drain placement.;  Surgeon: Floyce Stakes, MD;  Location: [place];  Service: Neurosurgery;  Laterality: N/A;   Family History  Problem Relation Age of Onset  . Diabetes Sister   . Diabetes Mother     deceased age 52  . Kidney failure Mother   . Alcohol abuse Father   . Colon cancer Neg Hx    Social History: Single--disabled due to psychological issues sine '02. Son (currently unemployed) lives with her and has been assisting with  housework/cooking. Has received approval for aide 3 x wk. Quit crack use 7 years ago. 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 She does not have any smokeless tobacco history on file. She reports that she drinks about 1.8 ounces of alcohol per week. She reports that she does not use illicit drugs.    Allergies  Allergen Reactions  . Aspirin     REACTION: Hx of overdose on this years ago -  too scared to take. Tried to committ suicide.   Medications Prior to Admission  Medication Sig Dispense Refill  . amLODipine (NORVASC) 10 MG tablet Take 10 mg by mouth daily.      . bethanechol (URECHOLINE) 25 MG tablet Take 1 tablet (25 mg total) by mouth 3 (three) times daily.  90 tablet  1  . oxyCODONE-acetaminophen (PERCOCET/ROXICET) 5-325 MG per tablet Take 1 tablet by mouth every 4 (four) hours as needed for pain.      . tamsulosin (FLOMAX) 0.4 MG CAPS Take 1 capsule (0.4 mg total) by mouth daily.  15 capsule  0    Home: Home Living Family/patient expects to be discharged to:: Private residence Living Arrangements: Children;Other (Comment) (65 y.o. son, [name], lives with pt. He is unemployed) Available Help at Discharge: Available PRN/intermittently;Other (Comment) [name] unemployed but is in and out) Type of Home: House Home Access: Stairs to enter Technical brewer of Steps: 3 Entrance Stairs-Rails: Right Home Layout: One level Home Equipment: Environmental consultant - 2 wheels;Cane - single point;Bedside commode Additional Comments: pt states she was to have PCS services arranged for her to begin at d/c; ? agency  Lives With: Son   Functional History: Prior Function Comments: used cane due to arthritis Lt knee worse than Rt  Functional Status:  Mobility: Bed Mobility Bed Mobility: Rolling Left;Left Sidelying to Sit;Sit to Supine Rolling Right: 5: Supervision;With rail Rolling Left: 5: Supervision Right Sidelying to Sit: 5: Supervision;With rails Left Sidelying to Sit: 5: Supervision;HOB flat Supine to Sit: 5: Supervision Sitting - Scoot to Edge of Bed: 5: Supervision Sit to Supine: 5: Supervision;HOB flat Transfers Transfers: Sit to Stand;Stand to Sit Sit to Stand: 4: Min guard;With upper extremity assist;From bed;From chair/3-in-1 Stand to Sit: 4: Min guard;With upper extremity assist;To chair/3-in-1;To bed Ambulation/Gait Ambulation/Gait Assistance: 4: Min assist;3: Mod  assist Ambulation Distance (Feet): 15 Feet Assistive device: Rolling walker Ambulation/Gait Assistance Details: (A) to manage RW with max cues for upright posture, step sequence and body position within RW.  Pt tends to ambulate to close to RW.  Pt continues to stay in flexed position during ambulation.  Occasional mod (A) needed due to left knee buckle.  Gait Pattern: Step-to pattern;Decreased stride length;Left genu recurvatum;Trunk flexed;Wide base of support Gait velocity: very slow and labored General Gait Details: pt now with significant pain in left knee with weight bearing.  Her feet/ankles are back to normal by her report.  Her knee appears to be at end stage DJD with audible bone crepittus with flexion. Stairs: No Wheelchair Mobility Wheelchair Mobility: No  ADL: ADL Eating/Feeding: Set up Where Assessed - Eating/Feeding: Chair Grooming: Performed;Wash/dry hands;Supervision/safety Where Assessed - Grooming: Supported standing Upper Body Bathing: Set up Where Assessed - Upper Body Bathing: Supported sitting Lower Body Bathing: Minimal assistance Where Assessed - Lower Body Bathing: Supported sit to stand Upper Body Dressing: Minimal assistance Where Assessed - Upper Body Dressing: Supported sitting Lower Body Dressing: Minimal assistance Where Assessed - Lower Body Dressing: Supported sit to Lobbyist: Magazine features editor Method:  Sit to stand Toilet Transfer Equipment: Raised toilet seat with arms (or 3-in-1 over toilet) Tub/Shower Transfer Method: Not assessed Equipment Used: Gait belt;Rolling walker Transfers/Ambulation Related to ADLs: Min guard for ambulation and transfers. Cues to stand upright and keep walker close. ADL Comments: Pt performed toileting tasks and washed hands at sink. Pt able to don/doff socks while sitting in chair. Min A for balance as she simulated bending and pulling up clothing. Educated on dressing technique.    Cognition: Cognition Overall Cognitive Status: Within Functional Limits for tasks assessed Orientation Level: Oriented X4 Cognition Arousal/Alertness: Awake/alert Behavior During Therapy: WFL for tasks assessed/performed Overall Cognitive Status: Within Functional Limits for tasks assessed  Physical Exam: Blood pressure 116/67, pulse 100, temperature 98.2 F (36.8 C), temperature source Oral, resp. rate 18, height 5\' 2"  (1.575 m), weight 77.4 kg (170 lb 10.2 oz), SpO2 100.00%.  Physical Exam  Nursing note and vitals reviewed.  Constitutional: She is oriented to person, place, and time. She appears well-developed and well-nourished.  HENT:  Head: Normocephalic and atraumatic.  Right Ear: External ear normal.  Left Ear: External ear normal.  Eyes: Conjunctivae and EOM are normal. Pupils are equal, round, and reactive to light.  Neck: Normal range of motion. Neck supple. No JVD present. No tracheal deviation present. No thyromegaly present.  Cardiovascular: Normal rate and regular rhythm. No murmur  Pulmonary/Chest: Effort normal and breath sounds normal. No wheezes or rales Abdominal: Soft. Bowel sounds are normal. She exhibits no distension. There is no tenderness.  Musculoskeletal:  Bilateral thenar atrophy. Left knee and ankle with minimal edema. Left foot tender.  Lymphadenopathy:  She has no cervical adenopathy.  Neurological: She is alert and oriented to person, place, and time.  Speech clear without dysphonia. Follows commands without difficulty.  RUE 3+ prox to 4/5 distally LUE is 3- prox to 4- distally. LE's are 3+/5. HF, Left knee inhibited by pain. Diminished sensation in hands more than feet     No results found for this or any previous visit (from the past 48 hour(s)). No results found.  Post Admission Physician Evaluation: 1. Functional deficits secondary  to cervical stenosis with myelopathy with recent CSF leak/fluid collection and multiple medical which have  led to further deconditioning. 2. Patient is admitted to receive collaborative, interdisciplinary care between the physiatrist, rehab nursing staff, and therapy team. 3. Patient's level of medical complexity and substantial therapy needs in context of that medical necessity cannot be provided at a lesser intensity of care such as a SNF. 4. Patient has experienced substantial functional loss from his/her baseline which was documented above under the "Functional History" and "Functional Status" headings.  Judging by the patient's diagnosis, physical exam, and functional history, the patient has potential for functional progress which will result in measurable gains while on inpatient rehab.  These gains will be of substantial and practical use upon discharge  in facilitating mobility and self-care at the household level. 5. Physiatrist will provide 24 hour management of medical needs as well as oversight of the therapy plan/treatment and provide guidance as appropriate regarding the interaction of the two. 6. 24 hour rehab nursing will assist with bladder management, bowel management, safety, skin/wound care, disease management, medication administration, pain management and patient education  and help integrate therapy concepts, techniques,education, etc. 7. PT will assess and treat for/with: Lower extremity strength, range of motion, stamina, balance, functional mobility, safety, adaptive techniques and equipment, pain mgt, NMR, education.   Goals are: mod I to supervision.  8. OT will assess and treat for/with: ADL's, functional mobility, safety, upper extremity strength, adaptive techniques and equipment, NMR, pain mgt, education.   Goals are: mod I to supervision. 9. SLP will assess and treat for/with: dysphagia.  Goals are: mod I---may only need follow up eval only. 10. Case Management and Social Worker will assess and treat for psychological issues and discharge planning. 11. Team conference will be  held weekly to assess progress toward goals and to determine barriers to discharge. 12. Patient will receive at least 3 hours of therapy per day at least 5 days per week. 13. ELOS: 7-10 days       14. Prognosis:  excellent   Medical Problem List and Plan: 1. DVT Prophylaxis/Anticoagulation: Pharmaceutical: Lovenox 2. Endstage DJD left Knee/Pain Management:  Will add voltaren gel to left knee. May need cortisone injection.  3. Mood: No signs of distress.  Motivated to get better and home. Will have LCSW follow for evaluation.  4. Neuropsych: This patient is capable of making decisions on her own behalf. 5. Neurogenic bladder: Check UA/UCS for follow up past treatment. Reports frequency/urgency--check PVRs to rule out overflow voiding. If PVRs low taper off urecholine and/or Flomax. 6. Dental pain: has been treated with IV antibiotics. Need to emphasize oral care--will order Peridex mouth wash after meals.  Augmentin 5-7 days with close monitoring for diarrhea/ c-diff.  7. Constipation: Component of neurogenic bowel? Will add miralax bid. Dulcolax suppository today.   Meredith Staggers, MD, [place] [facility]   08/22/2013

## 2013-08-22 NOTE — Progress Notes (Signed)
PT Cancellation Note  Patient Details Name: Colleen Nunez MRN: [id number] DOB: August 05, 1956   Cancelled Treatment:    Reason Eval/Treat Not Completed: Other (comment) CIR admission coordinator present at bedside and reports pt is being d/c to CIR today.    Jolyn Lent 08/22/2013, 12:10 PM  Jolyn Lent, PT, DPT Acute Rehabilitation Services

## 2013-08-22 NOTE — Progress Notes (Signed)
Patient in room--nursing reports temp 101 with recheck 5 minutes later at 100.7. Patient under multiple blankets and cooled off once these were removed. Patient reports being cold.  Will order UA/UCS, CXR, and blood cultures--will continue Augmentin for now.

## 2013-08-22 NOTE — H&P (Signed)
Physical Medicine and Rehabilitation Admission H&P    Chief Complaint  Patient presents with  . Cervical myelopathy with dysphagia due to post op fluid collection.  : HPI: Colleen Nunez is a 57 y.o. female with history of ACDF for stenosis with myelopathy 05/2013 and continued BLE weakness as well as recent gout flare up with left knee and ankle pain. Admitted on 08/07/13 to [facility] with N/V and difficulty swallowing. CT neck with with very large prevertebral but trans-spatial fluid collection encompassing 8.6 x 7.3 x 11.6 cm c/w a postoperative CSF leak and patient transferred to [facility] on 08/12/13. She was taken to OR the same day for evacuation of fluid collection and Insertion of spinal drai by Dr. Joya Salm. Swallow evaluation done showed no signs of dysphagia and regular textures, thin liquids recommended. Kleb Pneumo pyelonephritis treated with 10 day course of rocephin. Patient has had woresening of renal status and renal u/s with evidence of medical renal disease on right and unable to ID left kidney. Orthopanthogram done due to complaints of dental pain--limited due to cervical hardware. She was started on Augmentin on 10/04 with improvement of symptoms.  herapies initiated and patient limited by LLE pain with knee instability as well as decreased posture. CIR recommended by therapy team and patient admitted today.      Review of Systems  HENT: Positive for neck pain. Negative for hearing loss.  Eyes: Negative for blurred vision and double vision.  Respiratory: Positive for cough. Negative for shortness of breath.  Cardiovascular: Negative for chest pain and palpitations.  Gastrointestinal: Positive for heartburn and constipation. Negative for nausea.  Genitourinary: Positive for urgency and frequency.  Musculoskeletal: Positive for myalgias and joint pain (Needs left knee replaced.).  Neurological: Positive for tingling, sensory change, focal weakness and headaches (occipital  headaches--improved since ACDF.). Negative for dizziness   Past Medical History  Diagnosis Date  . Hypertension   . Arthritis   . Hepatitis C antibody test positive        . Renal insufficiency   . Anxiety   . Depression     h/o suicide attempts in the past  . GERD (gastroesophageal reflux disease)   . Polysubstance abuse     h/o  . Anemia   . Gout    Past Surgical History  Procedure Laterality Date  . Knee arthroscopy    . Abdominal hysterectomy    . Cartilage removal      left knee  . Anterior cervical corpectomy N/A 06/09/2013    Procedure: C4 C5 Corpectomy with C6-7 Anterior cervical fusion/Peek cage/Trestle plate;  Surgeon: Otilio Connors, MD;  Location: [place] [facility];  Service: Neurosurgery;  Laterality: N/A;  Cervical Four, Cervical Five Corpectomy and Cervical six-seven Anterior cervical fusion/Peek cage Three-Five /Trestle Plating Cervical Three to Cervical Seven  . X-stop implantation    . Bilateral soo and appendectomy  2006  . Anterior cervical decomp/discectomy fusion N/A 08/12/2013    Procedure: Repair of Anterior  Cervical CSF LEAK, lumbar drain placement.;  Surgeon: Floyce Stakes, MD;  Location: [place];  Service: Neurosurgery;  Laterality: N/A;   Family History  Problem Relation Age of Onset  . Diabetes Sister   . Diabetes Mother     deceased age 47  . Kidney failure Mother   . Alcohol abuse Father   . Colon cancer Neg Hx    Social History: Single--disabled due to psychological issues sine '02. Son (currently unemployed) lives with her and has been assisting with  housework/cooking. Has received approval for aide 3 x wk. Quit crack use 7 years ago. 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 She does not have any smokeless tobacco history on file. She reports that she drinks about 1.8 ounces of alcohol per week. She reports that she does not use illicit drugs.    Allergies  Allergen Reactions  . Aspirin     REACTION: Hx of overdose on this years ago -  too scared to take. Tried to committ suicide.   Medications Prior to Admission  Medication Sig Dispense Refill  . amLODipine (NORVASC) 10 MG tablet Take 10 mg by mouth daily.      . bethanechol (URECHOLINE) 25 MG tablet Take 1 tablet (25 mg total) by mouth 3 (three) times daily.  90 tablet  1  . oxyCODONE-acetaminophen (PERCOCET/ROXICET) 5-325 MG per tablet Take 1 tablet by mouth every 4 (four) hours as needed for pain.      . tamsulosin (FLOMAX) 0.4 MG CAPS Take 1 capsule (0.4 mg total) by mouth daily.  15 capsule  0    Home: Home Living Family/patient expects to be discharged to:: Private residence Living Arrangements: Children;Other (Comment) (68 y.o. son, Colleen Nunez, lives with pt. He is unemployed) Available Help at Discharge: Available PRN/intermittently;Other (Comment) Colleen Nunez unemployed but is in and out) Type of Home: House Home Access: Stairs to enter Technical brewer of Steps: 3 Entrance Stairs-Rails: Right Home Layout: One level Home Equipment: Environmental consultant - 2 wheels;Cane - single point;Bedside commode Additional Comments: pt states she was to have PCS services arranged for her to begin at d/c; ? agency  Lives With: Son   Functional History: Prior Function Comments: used cane due to arthritis Lt knee worse than Rt  Functional Status:  Mobility: Bed Mobility Bed Mobility: Rolling Left;Left Sidelying to Sit;Sit to Supine Rolling Right: 5: Supervision;With rail Rolling Left: 5: Supervision Right Sidelying to Sit: 5: Supervision;With rails Left Sidelying to Sit: 5: Supervision;HOB flat Supine to Sit: 5: Supervision Sitting - Scoot to Edge of Bed: 5: Supervision Sit to Supine: 5: Supervision;HOB flat Transfers Transfers: Sit to Stand;Stand to Sit Sit to Stand: 4: Min guard;With upper extremity assist;From bed;From chair/3-in-1 Stand to Sit: 4: Min guard;With upper extremity assist;To chair/3-in-1;To bed Ambulation/Gait Ambulation/Gait Assistance: 4: Min assist;3: Mod  assist Ambulation Distance (Feet): 15 Feet Assistive device: Rolling walker Ambulation/Gait Assistance Details: (A) to manage RW with max cues for upright posture, step sequence and body position within RW.  Pt tends to ambulate to close to RW.  Pt continues to stay in flexed position during ambulation.  Occasional mod (A) needed due to left knee buckle.  Gait Pattern: Step-to pattern;Decreased stride length;Left genu recurvatum;Trunk flexed;Wide base of support Gait velocity: very slow and labored General Gait Details: pt now with significant pain in left knee with weight bearing.  Her feet/ankles are back to normal by her report.  Her knee appears to be at end stage DJD with audible bone crepittus with flexion. Stairs: No Wheelchair Mobility Wheelchair Mobility: No  ADL: ADL Eating/Feeding: Set up Where Assessed - Eating/Feeding: Chair Grooming: Performed;Wash/dry hands;Supervision/safety Where Assessed - Grooming: Supported standing Upper Body Bathing: Set up Where Assessed - Upper Body Bathing: Supported sitting Lower Body Bathing: Minimal assistance Where Assessed - Lower Body Bathing: Supported sit to stand Upper Body Dressing: Minimal assistance Where Assessed - Upper Body Dressing: Supported sitting Lower Body Dressing: Minimal assistance Where Assessed - Lower Body Dressing: Supported sit to Lobbyist: Magazine features editor Method:  Sit to stand Toilet Transfer Equipment: Raised toilet seat with arms (or 3-in-1 over toilet) Tub/Shower Transfer Method: Not assessed Equipment Used: Gait belt;Rolling walker Transfers/Ambulation Related to ADLs: Min guard for ambulation and transfers. Cues to stand upright and keep walker close. ADL Comments: Pt performed toileting tasks and washed hands at sink. Pt able to don/doff socks while sitting in chair. Min A for balance as she simulated bending and pulling up clothing. Educated on dressing technique.    Cognition: Cognition Overall Cognitive Status: Within Functional Limits for tasks assessed Orientation Level: Oriented X4 Cognition Arousal/Alertness: Awake/alert Behavior During Therapy: WFL for tasks assessed/performed Overall Cognitive Status: Within Functional Limits for tasks assessed  Physical Exam: Blood pressure 116/67, pulse 100, temperature 98.2 F (36.8 C), temperature source Oral, resp. rate 18, height 5\' 2"  (1.575 m), weight 77.4 kg (170 lb 10.2 oz), SpO2 100.00%.  Physical Exam  Nursing note and vitals reviewed.  Constitutional: She is oriented to person, place, and time. She appears well-developed and well-nourished.  HENT:  Head: Normocephalic and atraumatic.  Right Ear: External ear normal.  Left Ear: External ear normal.  Eyes: Conjunctivae and EOM are normal. Pupils are equal, round, and reactive to light.  Neck: Normal range of motion. Neck supple. No JVD present. No tracheal deviation present. No thyromegaly present.  Cardiovascular: Normal rate and regular rhythm. No murmur  Pulmonary/Chest: Effort normal and breath sounds normal. No wheezes or rales Abdominal: Soft. Bowel sounds are normal. She exhibits no distension. There is no tenderness.  Musculoskeletal:  Bilateral thenar atrophy. Left knee and ankle with minimal edema. Left foot tender.  Lymphadenopathy:  She has no cervical adenopathy.  Neurological: She is alert and oriented to person, place, and time.  Speech clear without dysphonia. Follows commands without difficulty.  RUE 3+ prox to 4/5 distally LUE is 3- prox to 4- distally. LE's are 3+/5. HF, Left knee inhibited by pain. Diminished sensation in hands more than feet     No results found for this or any previous visit (from the past 48 hour(s)). No results found.  Post Admission Physician Evaluation: 1. Functional deficits secondary  to cervical stenosis with myelopathy with recent CSF leak/fluid collection and multiple medical which have  led to further deconditioning. 2. Patient is admitted to receive collaborative, interdisciplinary care between the physiatrist, rehab nursing staff, and therapy team. 3. Patient's level of medical complexity and substantial therapy needs in context of that medical necessity cannot be provided at a lesser intensity of care such as a SNF. 4. Patient has experienced substantial functional loss from his/her baseline which was documented above under the "Functional History" and "Functional Status" headings.  Judging by the patient's diagnosis, physical exam, and functional history, the patient has potential for functional progress which will result in measurable gains while on inpatient rehab.  These gains will be of substantial and practical use upon discharge  in facilitating mobility and self-care at the household level. 5. Physiatrist will provide 24 hour management of medical needs as well as oversight of the therapy plan/treatment and provide guidance as appropriate regarding the interaction of the two. 6. 24 hour rehab nursing will assist with bladder management, bowel management, safety, skin/wound care, disease management, medication administration, pain management and patient education  and help integrate therapy concepts, techniques,education, etc. 7. PT will assess and treat for/with: Lower extremity strength, range of motion, stamina, balance, functional mobility, safety, adaptive techniques and equipment, pain mgt, NMR, education.   Goals are: mod I to supervision.  8. OT will assess and treat for/with: ADL's, functional mobility, safety, upper extremity strength, adaptive techniques and equipment, NMR, pain mgt, education.   Goals are: mod I to supervision. 9. SLP will assess and treat for/with: dysphagia.  Goals are: mod I---may only need follow up eval only. 10. Case Management and Social Worker will assess and treat for psychological issues and discharge planning. 11. Team conference will be  held weekly to assess progress toward goals and to determine barriers to discharge. 12. Patient will receive at least 3 hours of therapy per day at least 5 days per week. 13. ELOS: 7-10 days       14. Prognosis:  excellent   Medical Problem List and Plan: 1. DVT Prophylaxis/Anticoagulation: Pharmaceutical: Lovenox 2. Endstage DJD left Knee/Pain Management:  Will add voltaren gel to left knee. May need cortisone injection.  3. Mood: No signs of distress.  Motivated to get better and home. Will have LCSW follow for evaluation.  4. Neuropsych: This patient is capable of making decisions on her own behalf. 5. Neurogenic bladder: Check UA/UCS for follow up past treatment. Reports frequency/urgency--check PVRs to rule out overflow voiding. If PVRs low taper off urecholine and/or Flomax. 6. Dental pain: has been treated with IV antibiotics. Need to emphasize oral care--will order Peridex mouth wash after meals.  Augmentin 5-7 days with close monitoring for diarrhea/ c-diff.  7. Constipation: Component of neurogenic bowel? Will add miralax bid. Dulcolax suppository today.   Meredith Staggers, MD, [place] [facility]   08/22/2013

## 2013-08-22 NOTE — Consult Note (Signed)
Physical Medicine and Rehabilitation Consult  Reason for Consult: Cervical myelopathy with dysphagia Referring Physician:  Dr. Tyrell Antonio   HPI: Colleen Nunez is a 57 y.o. female with history of ACDF for stenosis with myelopathy 05/2013 and continued BLE weakness as well as recent gout flare up with left knee and ankle pain. Admitted on 08/07/13 to [facility] with N/V and difficulty swallowing. CT neck with with very large prevertebral but trans-spatial fluid collection encompassing 8.6 x 7.3 x 11.6 cm c/w a postoperative CSF leak and patient transferred to [facility] on 08/12/13. She was taken to OR the same day for evacuation of fluid collection and  Insertion of spinal drai by Dr. Joya Salm. Swallow evaluation done showed no signs of dysphagia and regular textures, thin liquids recommended. Patient has had woresening of renal status and renal u/s with evidence of medical renal disease on right and unable to ID left kidney. Therapies initiated and patient limited by LLE pain with knee instability as well as decreased posture. MD, PT recommending CIR.     Review of Systems  HENT: Positive for neck pain. Negative for hearing loss.   Eyes: Negative for blurred vision and double vision.  Respiratory: Positive for cough. Negative for shortness of breath.   Cardiovascular: Negative for chest pain and palpitations.  Gastrointestinal: Positive for heartburn and constipation. Negative for nausea.  Genitourinary: Positive for urgency and frequency.  Musculoskeletal: Positive for myalgias and joint pain (Needs left knee replaced. ).  Neurological: Positive for tingling, sensory change, focal weakness and headaches (occipital headaches--improved since ACDF.). Negative for dizziness.   Past Medical History  Diagnosis Date  . Hypertension   . Arthritis   . Hepatitis C antibody test positive        . Renal insufficiency   . Anxiety   . Depression     h/o suicide attempts in the past  . GERD (gastroesophageal reflux  disease)   . Polysubstance abuse     h/o  . Anemia   . Gout    Past Surgical History  Procedure Laterality Date  . Knee arthroscopy    . Abdominal hysterectomy    . Cartilage removal      left knee  . Anterior cervical corpectomy N/A 06/09/2013    Procedure: C4 C5 Corpectomy with C6-7 Anterior cervical fusion/Peek cage/Trestle plate;  Surgeon: Otilio Connors, MD;  Location: [place] [facility];  Service: Neurosurgery;  Laterality: N/A;  Cervical Four, Cervical Five Corpectomy and Cervical six-seven Anterior cervical fusion/Peek cage Three-Five /Trestle Plating Cervical Three to Cervical Seven  . X-stop implantation    . Bilateral soo and appendectomy  2006  . Anterior cervical decomp/discectomy fusion N/A 08/12/2013    Procedure: Repair of Anterior  Cervical CSF LEAK, lumbar drain placement.;  Surgeon: Floyce Stakes, MD;  Location: [place];  Service: Neurosurgery;  Laterality: N/A;   Family History  Problem Relation Age of Onset  . Diabetes Sister   . Diabetes Mother     deceased age 28  . Kidney failure Mother   . Alcohol abuse Father   . Colon cancer Neg Hx    Social History: Single--disabled due to psychological issues sine '02. Son (currently unemployed)  lives with her and has been assisting with housework/cooking. Has received approval for  aide 3 x wk. Quit crack use 7 years ago. 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  She does not have any smokeless tobacco history on file. She reports that she drinks about 1.8 ounces of alcohol  per week. She reports that she does not use illicit drugs.    Allergies  Allergen Reactions  . Aspirin     REACTION: Hx of overdose on this years ago - too scared to take. Tried to committ suicide.   Medications Prior to Admission  Medication Sig Dispense Refill  . amLODipine (NORVASC) 10 MG tablet Take 10 mg by mouth daily.      . bethanechol (URECHOLINE) 25 MG tablet Take 1 tablet (25 mg total) by mouth 3 (three) times daily.  90 tablet  1   . oxyCODONE-acetaminophen (PERCOCET/ROXICET) 5-325 MG per tablet Take 1 tablet by mouth every 4 (four) hours as needed for pain.      . tamsulosin (FLOMAX) 0.4 MG CAPS Take 1 capsule (0.4 mg total) by mouth daily.  15 capsule  0    Home: Home Living Family/patient expects to be discharged to:: Private residence Living Arrangements: Children Available Help at Discharge: Family;Available PRN/intermittently (son works days ) Type of Home: House Home Access: Stairs to enter Technical brewer of Steps: 3 Entrance Stairs-Rails: Right Home Layout: One level Home Equipment: Environmental consultant - 2 wheels;Cane - single point;Bedside commode  Functional History: Prior Function Comments: used cane due to arthritis Lt knee worse than Rt Functional Status:  Mobility: Bed Mobility Bed Mobility: Rolling Right;Right Sidelying to Sit;Sitting - Scoot to Edge of Bed Rolling Right: 5: Supervision;With rail Rolling Left: 5: Supervision Right Sidelying to Sit: 5: Supervision;With rails Supine to Sit: 5: Supervision Sitting - Scoot to Edge of Bed: 5: Supervision Transfers Transfers: Sit to Stand;Stand to Sit Sit to Stand: 4: Min assist;With upper extremity assist;From elevated surface;From bed;From toilet Stand to Sit: 4: Min guard;With upper extremity assist;To chair/3-in-1;To toilet Ambulation/Gait Ambulation/Gait Assistance: 4: Min assist;3: Mod assist Ambulation Distance (Feet): 15 Feet Assistive device: Rolling walker Ambulation/Gait Assistance Details: (A) to manage RW with max cues for upright posture, step sequence and body position within RW.  Pt tends to ambulate to close to RW.  Pt continues to stay in flexed position during ambulation.  Occasional mod (A) needed due to left knee buckle.  Gait Pattern: Step-to pattern;Decreased stride length;Left genu recurvatum;Trunk flexed;Wide base of support Gait velocity: very slow and labored General Gait Details: pt now with significant pain in left knee  with weight bearing.  Her feet/ankles are back to normal by her report.  Her knee appears to be at end stage DJD with audible bone crepittus with flexion. Stairs: No Architect: No  ADL:    Cognition: Cognition Overall Cognitive Status: Within Functional Limits for tasks assessed Orientation Level: Oriented X4 Cognition Arousal/Alertness: Awake/alert Behavior During Therapy: WFL for tasks assessed/performed Overall Cognitive Status: Within Functional Limits for tasks assessed  Blood pressure 116/67, pulse 100, temperature 98.2 F (36.8 C), temperature source Oral, resp. rate 18, height 5\' 2"  (1.575 m), weight 77.4 kg (170 lb 10.2 oz), SpO2 100.00%.   Physical Exam  Nursing note and vitals reviewed. Constitutional: She is oriented to person, place, and time. She appears well-developed and well-nourished.  HENT:  Head: Normocephalic and atraumatic.  Right Ear: External ear normal.  Left Ear: External ear normal.  Eyes: Conjunctivae and EOM are normal. Pupils are equal, round, and reactive to light.  Neck: Normal range of motion. Neck supple. No JVD present. No tracheal deviation present. No thyromegaly present.  Cardiovascular: Normal rate and regular rhythm.   Pulmonary/Chest: Effort normal and breath sounds normal.  Abdominal: Soft. Bowel sounds are normal. She exhibits no  distension. There is no tenderness.  Musculoskeletal:  Bilateral thenar atrophy. Left knee and ankle with minimal edema. Left foot tender.   Lymphadenopathy:    She has no cervical adenopathy.  Neurological: She is alert and oriented to person, place, and time.  Speech clear without dysphonia. Follows commands without difficulty. Distal> proximal weakness BUE. RUE 3+ prox to 4/5 distally LUE is 3- prox to 4- distally. LE's are 3+/5. HF, Left knee inhibited by pain. Diminished sensation in hands more than feet     No results found for this or any previous visit (from the past 24  hour(s)). No results found.  Assessment/Plan: Diagnosis: cervical stenosis with myelopathy, cervical fluid collection s/p evacuation 1. Does the need for close, 24 hr/day medical supervision in concert with the patient's rehab needs make it unreasonable for this patient to be served in a less intensive setting? Yes 2. Co-Morbidities requiring supervision/potential complications: uti, anemia, gout 3. Due to bladder management, bowel management, safety, skin/wound care, disease management, medication administration, pain management and patient education, does the patient require 24 hr/day rehab nursing? Yes 4. Does the patient require coordinated care of a physician, rehab nurse, PT (1-2 hrs/day, 5 days/week) and OT (1-2 hrs/day, 5 days/week) to address physical and functional deficits in the context of the above medical diagnosis(es)? Yes Addressing deficits in the following areas: balance, endurance, locomotion, strength, transferring, bowel/bladder control, bathing, dressing, feeding, grooming and toileting 5. Can the patient actively participate in an intensive therapy program of at least 3 hrs of therapy per day at least 5 days per week? Yes 6. The potential for patient to make measurable gains while on inpatient rehab is excellent 7. Anticipated functional outcomes upon discharge from inpatient rehab are mod I with PT, mod I to supervision with OT, n/a with SLP. 8. Estimated rehab length of stay to reach the above functional goals is: 1-2 weeks 9. Does the patient have adequate social supports to accommodate these discharge functional goals? Yes 10. Anticipated D/C setting: Home 11. Anticipated post D/C treatments: [facility] therapy 12. Overall Rehab/Functional Prognosis: excellent  RECOMMENDATIONS: This patient's condition is appropriate for continued rehabilitative care in the following setting: CIR Patient has agreed to participate in recommended program. Yes Note that insurance prior  authorization may be required for reimbursement for recommended care.  Comment: Rehab RN to follow up.   Meredith Staggers, MD, [facility]     08/22/2013

## 2013-08-22 NOTE — Progress Notes (Signed)
Doing well.  No HA , increasing activity  Temp:  [98.2 F (36.8 C)-98.5 F (36.9 C)] 98.2 F (36.8 C) (10/06 0534) Pulse Rate:  [100-107] 100 (10/06 0534) Resp:  [18] 18 (10/06 0534) BP: (112-125)/(64-71) 116/67 mmHg (10/06 0534) SpO2:  [100 %] 100 % (10/06 0534) Incisions flat  -   No sign of fluid re accumulation , no drainage from back(drain site)  Plan: Increase activity  - needs to Follow up with me in 3-4 weeks  - d/c staples in back in am , recall if we can be of further assistance

## 2013-08-22 NOTE — Progress Notes (Signed)
I met with pt at bedside. We discussed admission to inpt rehab today and pt is in agreement. I have left a message for he daughter in law per her request to notify of admission to inpt rehab today. Dr. Tyrell Antonio is also made aware. [id number]

## 2013-08-22 NOTE — Discharge Summary (Signed)
Physician Discharge Summary  Colleen Nunez [id number] DOB: [birth year] DOA: 08/07/2013  PCP: Rosita Fire, MD  Admit date: 08/07/2013 Discharge date: 08/22/2013  Time spent: 35 minutes  Recommendations for Outpatient Follow-up:  1. Please evaluate for need of steroid injection for left knee.  2. Need laxatives for constipation.  3. Need to follow up with dentis. Continue with antibiotics. 4. Need to follow up with Dr Luiz Ochoa with neurosurgery.   Discharge Diagnoses:     Cervical myelopathy   ANEMIA, NORMOCYTIC   UTI (lower urinary tract infection)   Nausea & vomiting   Chronic kidney disease, stage IV (severe)   Hypokalemia   AKI (acute kidney injury)   Discharge Condition: stable  Diet recommendation: Heart Healthy  Filed Weights   08/07/13 2238 08/13/13 0130 08/19/13 1637  Weight: 86.5 kg (190 lb 11.2 oz) 74.8 kg (164 lb 14.5 oz) 77.4 kg (170 lb 10.2 oz)    History of present illness:  57 year old woman presented emergency department with several complaints: Ongoing emesis for 2 months, generalized lower extremity weakness. Initial evaluation suggested UTI the patient was referred for further evaluation.  History obtained from patient, vague historian making data gathering difficult. Patient status post significant cervical surgery with discectomy and fusion at multiple levels 05/2013. Procedure performed for cervical stenosis, spondylosis, myelopathy and urinary retention. Since that time she has had generalized weakness especially lower extremity weakness. She was just seen by her neurosurgeon within the last week. Fluid collection over her anterior neck noted. Told by her surgeon that this may need to be drained in one month. Plan was to see her in one month.  The patient reports that she has had daily vomiting ever since surgery now for 2 months. She denies solid food intake and cannot tolerate liquids. Again she reports this is daily for 2 months now. She complains of  lower extremity weakness. She has been using a walker. She lives alone and has been managing although it has been quite difficult. When asked several times about the chronicity of her weakness and symptoms her history remains very vague but she reports that this has been present for 2 months now without any new focal neurologic/muscular deficit or weakness.  She reports some fever and chills at home perhaps 58 weeks.  In the emergency department temperature 100. Heart rate 110-120s. Vitals otherwise stable. Laboratory studies notable for mild hyponatremia 131 with low chloride suggesting dehydration. CBC mild leukocytosis otherwise stable. Urinalysis consistent with UTI. Chest x-ray negative for acute disease. Soft tissue fullness over the next seen   Hospital Course:  57 year old female who initially was admitted to [facility] for dysphagia. Patient recently had a C-spine surgery prior to this admission. At [facility] she was seen by GI and a CT scan of the neck revealed a large CSF leak from her recent C-spine surgery compressing her esophagus. She was then transferred to [facility] on day 5 of her hospital stay. She was seen by Neurosurgery and underwent CSF leak decompression on 08/12/2013 by Dr. Joya Salm. Patient received 10 days of IV rocephin. Neurosurgery recommend to discontinue antibiotics. She is now on Augmentin for teeth infection. She will need to follow up with dentist. Her dysphagia has improved. She will need to follow up with GI for further evaluation.   1-Severe dysphagia - acutely exacerbated by large CSF leak/paraspinal collection  The NS who knew the pt previously reports that she was experiencing dysphagia/vomiting PRIOR to her surgery - review of records reveals  GI notation of pt compalining of difficulty swallowing solids back in 2009, at which time plan was to obtain EGD, though this was not completed - I suspect pt may have an esophageal ring - acute worsening  likely related to CSF leak, but ? not the underlying etio - pt cleared for regular diet with thin liquids per SLP and is tolerating.  -Tolerating Diet.  -Will consider barium esophagram if sx recur, but for now pt having no issues   Large CSF leak and paraspinal collection  As per Neurosurgery - drain now out.  No need to continue with antibiotics.   Nausea/ vomiting  stopped Colchicine - symptoms resolved   KLEBSIELLA PNEUMONIAE pyelonephritis  Proven to be sensitive to rocephin - has completed 10 day course of tx - NS continued Rocephin additional 2 days until drain removed.   Progressive renal insufficiency / acute renal failure  crt gradually and consistently climbing - renal US w/o acute findings, but does note atrophic L kidney.- avoid potential nephrotoxins - stopped colchicine - repeat UA unrevealing  Improving. Cr 1.1.   Hypertension  BP controlled   Hypokalemia  Resolved.   Gout  Now off med tx due to advancing renal failure - if flairs, prednisone will be only option for tx - hope to avoid   Tooth pain - ?abscess  Lower left incisor - cont abx for now -  Orthopantogram: Very limited evaluation by orthopantomogram due to artifact created by blurring of cervical spine fusion hardware. The mandibular teeth  cannot be adequately assessed.  Continue with Augmentin.  would need /extraction at some point.   Hep C positive     Procedures: Orthopantogram: Very limited evaluation by orthopantomogram due to artifact created by blurring of cervical spine fusion hardware. The mandibular teeth  cannot be adequately assessed.  Renal US: On the previous CT examination atrophic left kidney was identified.  On the current examination the left kidney cannot be identified.  No right hydronephrosis or right renal mass is evident. There is  some increased echogenicity of the renal parenchyma with decrease in  differentiation between cortex and medulla consistent with medical  renal  disease.   Consultations:  Dr Luiz Ochoa  Discharge Exam: Filed Vitals:   08/22/13 0534  BP: 116/67  Pulse: 100  Temp: 98.2 F (36.8 C)  Resp: 18    General: no distress.  Cardiovascular: S 1, S 2 RRR Respiratory: CTA Lower extremities; left knee with dressing.   Discharge Instructions     Medication List         amLODipine 10 MG tablet  Commonly known as:  NORVASC  Take 10 mg by mouth daily.     bethanechol 25 MG tablet  Commonly known as:  URECHOLINE  Take 1 tablet (25 mg total) by mouth 3 (three) times daily.     oxyCODONE-acetaminophen 5-325 MG per tablet  Commonly known as:  PERCOCET/ROXICET  Take 1 tablet by mouth every 4 (four) hours as needed for pain.     tamsulosin 0.4 MG Caps capsule  Commonly known as:  FLOMAX  Take 1 capsule (0.4 mg total) by mouth daily.       Allergies  Allergen Reactions  . Aspirin     REACTION: Hx of overdose on this years ago - too scared to take. Tried to committ suicide.      The results of significant diagnostics from this hospitalization (including imaging, microbiology, ancillary and laboratory) are listed below for reference.    Significant  Diagnostic Studies: Ct Abdomen Pelvis Wo Contrast  08/11/2013   CLINICAL DATA:  Abdomen pain. Unexplained anemia. Constipation  EXAM: CT ABDOMEN AND PELVIS WITHOUT CONTRAST  TECHNIQUE: Multidetector CT imaging of the abdomen and pelvis was performed following the standard protocol without intravenous contrast. Oral contrast is administered.  COMPARISON:  June 04, 2005  FINDINGS: There is focal fatty infiltration or liver near the falciform ligament. The liver is otherwise normal. The spleen, pancreas, gallbladder, adrenal glands are normal. The left kidney is atrophic. There is mild right perinephric stranding. There is no right hydronephrosis or right renal stones. Small right parapelvic renal cyst is unchanged. The aorta is normal. There is no abdominal lymphadenopathy. There is no  small bowel obstruction or diverticulitis. There is mild thickening of of colonic wall in the transverse colon probably due to under distension. The appendix is not seen but no inflammation is noted around the cecum.  The bladder is partial decompressed with mild diffuse bladder wall thickening nonspecific, probably due to under distension. Pelvic phleboliths are identified. The lung bases are clear. Degenerative joint changes of the spine are identified.  IMPRESSION: Mild right perinephric stranding without evidence of obstruction or renal stone. This is nonspecific but can be seen in pyelonephritis. Clinical correlation is recommended. No acute abnormalities identified in the pelvis.   Electronically Signed   By: Abelardo Diesel   On: 08/11/2013 13:00   Dg Orthopantogram  08/19/2013   CLINICAL DATA:  Possible dental abscess and pain of a left lower incisor.  EXAM: ORTHOPANTOGRAM/PANORAMIC  COMPARISON:  None.  FINDINGS: Evaluation of the lower teeth is severely compromised by blurring of cervical spine fusion hardware after prior extensive cervical fusion and cervical corpectomy. The study is nondiagnostic in evaluating for possible dental abscess of the lower teeth. Dedicated dental spot films would be necessary to accurately evaluate the teeth. No obvious dental pathology is seen involving the upper teeth. No obvious bony destruction is identified involving the visualized mandible or maxilla.  IMPRESSION: Very limited evaluation by orthopantomogram due to artifact created by blurring of cervical spine fusion hardware. The mandibular teeth cannot be adequately assessed.   Electronically Signed   By: Aletta Edouard M.D.   On: 08/19/2013 14:28   Dg Ankle Complete Left  08/08/2013   CLINICAL DATA:  Left ankle pain.  EXAM: LEFT ANKLE COMPLETE - 3+ VIEW  COMPARISON:  Plain films left ankle 08/08/2004.  FINDINGS: No acute bony or joint abnormality is identified. As on the prior study, there is degenerative change  most notable about the talonavicular joint. Calcaneal spur is identified.  IMPRESSION: No acute finding.  Talonavicular degenerative change.  Calcaneal spur.   Electronically Signed   By: Inge Rise M.D.   On: 08/08/2013 21:48   Dg Ankle Complete Right  08/08/2013   CLINICAL DATA:  Right ankle and foot pain and swelling.  EXAM: RIGHT ANKLE - COMPLETE 3+ VIEW  COMPARISON:  None.  FINDINGS: No acute bony or joint abnormality is identified. There is degenerative change of the midfoot, worst at the talonavicular joint. Small linear calcification in the Achilles tendon is compatible chronic tendinosis. Tiny well corticated bony fragment off the lateral malleolus is compatible with old trauma.  IMPRESSION: No acute finding.  Degenerative change most notable at the talonavicular joint.  Findings compatible with chronic/remote Achilles tendinosis.   Electronically Signed   By: Inge Rise M.D.   On: 08/08/2013 21:27   Ct Soft Tissue Neck Wo Contrast  08/11/2013  CLINICAL DATA:  57 year old female with difficulty swallowing. Cervical spine surgery in July with subsequent swelling. Fluid collection.  EXAM: CT NECK WITHOUT CONTRAST  TECHNIQUE: Multidetector CT imaging of the neck was performed following the standard protocol without intravenous contrast.  COMPARISON:  Cervical spine MRI and CT 06/03/2013.  FINDINGS: Sequelae of cervical spine surgery from the C3 level to C7, including C4 and C5 corpectomies. Anterior cervical spine hardware present from the C3 to the C7 levels. Insert cervicothoracic Bilateral posterior element alignment is within normal limits. Insert skullbase  Very large trans spatial and mildly lobulated fluid collection tracks from the prevertebral space anteriorly and abuts the skin surface at the level of the left thyroid. This encompasses 8.6 x 7.3 x 11.6 cm (AP by transverse by CC). The fluid is simple by densitometry (7 Hounsfield units). There is associated mass effect on neck  structures, including lateral displacement of the left carotid space, complete effacement of the cervical and upper thoracic esophagus as well as anterior displacement of the hypopharynx and airway.  No surrounding inflammatory stranding, including no inflammation in the superior mediastinum.  Negative visualized non contrast brain parenchyma, nasopharynx, superior parapharyngeal spaces, sublingual space, submandibular glands and parotid glands. Chronic left orbital floor fracture re- identified.  IMPRESSION: Very large prevertebral but trans-spatial fluid collection encompassing 8.6 x 7.3 x 11.6 cm. This tracks to the skin surface at the level of the left thyroid and is most compatible with a postoperative CSF leak.  Associated regional mass effect, including severe effacement of the hypopharynx and upper esophagus.  These results will be called to the ordering clinician or representative by the Radiologist Assistant, and communication documented in the PACS Dashboard.   Electronically Signed   By: Lars Pinks M.D.   On: 08/11/2013 17:14   US Renal Port  08/18/2013   CLINICAL DATA:  History of acute renal failure on chronic stage IV renal disease. History of urinary tract infections. History of atrophic left kidney.  EXAM: RENAL/URINARY TRACT ULTRASOUND COMPLETE  COMPARISON:  CT 08/11/2013.  FINDINGS: Right Kidney  Length: Right renal length is 9.3 cm. Examination of the right kidney shows no evidence of hydronephrosis, solid or cystic mass, calculus, or parenchymal loss. There is some increased echogenicity of the renal parenchyma with decrease in differentiation between cortex and medulla consistent with medical renal disease.  Left Kidney  On the previous CT examination atrophic left kidney was identified. On the current examination the left kidney cannot be identified.  Bladder:  Appears normal for degree of bladder distention.  IMPRESSION: On the previous CT examination atrophic left kidney was identified. On  the current examination the left kidney cannot be identified.  No right hydronephrosis or right renal mass is evident. There is some increased echogenicity of the renal parenchyma with decrease in differentiation between cortex and medulla consistent with medical renal disease.   Electronically Signed   By: Shanon Brow  Call M.D.   On: 08/18/2013 15:31   Dg Chest Port 1 View  08/14/2013   *RADIOLOGY REPORT*  Clinical Data: Cough  PORTABLE CHEST - 1 VIEW  Comparison: Prior chest x-ray 08/07/2013  Findings: Stable cardiac and mediastinal contours which are within normal limits.  The lungs are clear.  No edema, focal airspace consolidation, effusion or pneumothorax.  Incompletely imaged anterior cervical fusion hardware.  No acute osseous abnormality. Fullness of the paratracheal stripe seen on the prior radiograph are less conspicuous on today's exam and are likely artifactual.  IMPRESSION: No acute cardiopulmonary disease.  Original Report Authenticated By: Jacqulynn Cadet, M.D.   Dg Chest Port 1 View  08/07/2013   CLINICAL DATA:  Nausea  EXAM: PORTABLE CHEST - 1 VIEW  COMPARISON:  June 09, 2013  FINDINGS: There is soft tissue fullness in the paratracheal regions bilaterally which was not present on prior study. Elsewhere, lungs are clear. Heart size and pulmonary vascularity are normal. There is no adenopathy beyond potential adenopathy in the peritracheal regions. No pneumothorax. There is arthropathy in both shoulders. There is postoperative change in the cervical spine inferiorly.  IMPRESSION: Soft tissue fullness in the paratracheal regions bilaterally. Etiology for this soft tissue fullness is uncertain. Given this finding, upright PA and lateral chest radiographs advised to further evaluate. If soft tissue opacity persists in these regions of the following upright PA and lateral chest imaging, contrast enhanced chest CT would be advised to further assess.  Elsewhere lungs appear clear.   Electronically  Signed   By: Lowella Grip   On: 08/07/2013 14:39   Dg Foot Complete Left  08/08/2013   CLINICAL DATA:  Left foot and ankle pain.  EXAM: LEFT FOOT - COMPLETE 3+ VIEW  COMPARISON:  Plain films left foot 01/29/2011.  FINDINGS: No acute bony or joint abnormality is identified. Degenerative change is seen about the foot, worse at the talonavicular and 1st MTP joints. Calcaneal spur is noted.  IMPRESSION: No acute finding.  Degenerative disease most notable at the talonavicular and 1st MTP joints.   Electronically Signed   By: Inge Rise M.D.   On: 08/08/2013 21:47   Dg Foot Complete Right  08/08/2013   CLINICAL DATA:  Right foot pain and swelling.  EXAM: RIGHT FOOT COMPLETE - 3+ VIEW  COMPARISON:  None.  FINDINGS: No acute bony or joint abnormality is identified. Degenerative change about the talonavicular and tibiotalar joints identified. 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 Soft tissues are unremarkable.  IMPRESSION: No acute finding.  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   Electronically Signed   By: Inge Rise M.D.   On: 08/08/2013 21:45    Microbiology: Recent Results (from the past 240 hour(s))  MRSA PCR SCREENING     Status: None   Collection Time    08/13/13  2:05 AM      Result Value Range Status   MRSA by PCR NEGATIVE  NEGATIVE Final   Comment:            The GeneXpert MRSA Assay (FDA     approved for NASAL specimens     only), is one component of a     comprehensive MRSA colonization     surveillance program. It is not     intended to diagnose MRSA     infection nor to guide or     monitor treatment for     MRSA infections.     Labs: Basic Metabolic Panel:  Recent Labs Lab 08/16/13 0415 08/17/13 0520 08/18/13 0443 08/20/13 0550  NA 140 137 141 141  K 2.9* 3.9 3.5 3.8  CL 104 102 107 106  CO2 24 20 22 23   GLUCOSE 100* 75 100* 108*  BUN 9 11 13 16   CREATININE 1.30* 1.24* 1.37* 1.17*  CALCIUM 8.3* 8.5 8.2* 8.1*  MG  --  1.0*  --   --     Liver Function Tests: No results found for this basename: AST, ALT, ALKPHOS, BILITOT, PROT, ALBUMIN,  in the last 168 hours No results found for this basename: LIPASE,  AMYLASE,  in the last 168 hours No results found for this basename: AMMONIA,  in the last 168 hours CBC:  Recent Labs Lab 08/16/13 0415 08/17/13 0520 08/20/13 0550  WBC 7.7 8.4 6.1  HGB 11.4* 11.8* 9.6*  HCT 33.3* 34.7* 28.2*  MCV 78.7 79.0 79.0  PLT 299 312 211   Cardiac Enzymes: No results found for this basename: CKTOTAL, CKMB, CKMBINDEX, TROPONINI,  in the last 168 hours BNP: BNP (last 3 results) No results found for this basename: PROBNP,  in the last 8760 hours CBG: No results found for this basename: GLUCAP,  in the last 168 hours     Signed:  Yandiel Bergum  [facility] 08/22/2013, 11:43 AM

## 2013-08-22 NOTE — PMR Pre-admission (Signed)
PMR Admission Coordinator Pre-Admission Assessment  Patient: Colleen Nunez is an 57 y.o., female MRN: [id number] DOB: 1956-11-16 Height: 5\' 2"  (157.5 cm) Weight: 77.4 kg (170 lb 10.2 oz)              Insurance Information HMO:     PPO:      PCP:      IPA:      80/20:      OTHER:  PRIMARY: Medicaid [place] Access      Policy#: [id number]      Subscriber: pt Benefits:  Phone #: [phone]     Name: 08/22/13 Eff. Date: active 08/22/13       Emergency Contact Information Contact Information   Name Relation Home Work Mobile   Tryon,Heather Relative [phone]     [name], Zunino   [phone]     Current Medical History  Patient Admitting Diagnosis: cervical stenosis with myelopathy, cervical fluid collection s/p evacuation  History of Present Illness: Colleen Nunez is a 57 y.o. female with history of ACDF for stenosis with myelopathy 05/2013 and continued BLE weakness as well as recent gout flare up with left knee and ankle pain.   Admitted on 08/07/13 to [facility] with N/V and difficulty swallowing. CT neck with with very large prevertebral but trans-spatial fluid collection encompassing 8.6 x 7.3 x 11.6 cm c/w a postoperative CSF leak and patient transferred to [facility] on 08/12/13. She was taken to OR the same day for evacuation of fluid collection and Insertion of spinal drain by Dr. Joya Salm. Patient received 10 days of IV Rocephin. She is now on Augmentin for teeth infection. Swallow evaluation done showed no signs of dysphagia and regular textures, thin liquids recommended. Patient has had woresening of renal status and renal u/s with evidence of medical renal disease on right and unable to ID left kidney.   Severe dysphagia - acutely exacerbated by large CSF leak/paraspinal collection  The NS who knew the pt previously reports that she was experiencing dysphagia/vomiting PRIOR to her surgery - review of records reveals GI notation of pt compalining of difficulty swallowing solids back in  2009, at which time plan was to obtain EGD, though this was not completed - I suspect pt may have an esophageal ring - acute worsening likely related to CSF leak, but ? not the underlying etio - pt cleared for regular diet with thin liquids per SLP and is tolerating.  -Tolerating Diet.  -Will consider barium esophagram if sx recur, but for now pt having no issues .  Past Medical History  Past Medical History  Diagnosis Date  . Hypertension   . Arthritis   . Hepatitis C antibody test positive        . Renal insufficiency   . Anxiety   . Depression     h/o suicide attempts in the past  . GERD (gastroesophageal reflux disease)   . Polysubstance abuse     h/o  . Anemia   . Gout     Family History  family history includes Alcohol abuse in her father; Diabetes in her mother and sister; Kidney failure in her mother. There is no history of Colon cancer.  Prior Rehab/Hospitalizations: none   Current Medications  Current facility-administered medications:acetaminophen (TYLENOL) tablet 650 mg, 650 mg, Oral, Q6H PRN, Samuella Cota, MD, 650 mg at 08/11/13 0418;  amLODipine (NORVASC) tablet 10 mg, 10 mg, Oral, Daily, Cherene Altes, MD, 10 mg at 08/22/13 0944;  amoxicillin-clavulanate (AUGMENTIN) 400-57 MG/5ML suspension 875  mg, 875 mg, Oral, Q12H, Belkys A Regalado, MD, 875 mg at 08/22/13 0943 bethanechol (URECHOLINE) tablet 25 mg, 25 mg, Oral, TID, Samuella Cota, MD, 25 mg at 08/22/13 0943;  bisacodyl (DULCOLAX) EC tablet 10 mg, 10 mg, Oral, Daily, Thurnell Lose, MD, 10 mg at 08/22/13 0943;  feeding supplement (ENSURE COMPLETE) liquid 237 mL, 237 mL, Oral, BID BM, Heather Cornelison Pitts, RD, 237 mL at 08/22/13 0943;  morphine 2 MG/ML injection 2 mg, 2 mg, Intravenous, Q4H PRN, Jerrye Bushy Reidler, PA-C, 2 mg at 08/18/13 2139 ondansetron (ZOFRAN) injection 4 mg, 4 mg, Intravenous, Q6H PRN, Samuella Cota, MD, 4 mg at 08/20/13 1331;  ondansetron (ZOFRAN) tablet 4 mg, 4 mg, Oral, TID  AC, Danie Binder, MD, 4 mg at 08/22/13 1202;  oxyCODONE-acetaminophen (PERCOCET/ROXICET) 5-325 MG per tablet 1 tablet, 1 tablet, Oral, Q4H PRN, Samuella Cota, MD, 1 tablet at 08/19/13 1148 pantoprazole (PROTONIX) EC tablet 40 mg, 40 mg, Oral, BID AC, Danie Binder, MD, 40 mg at 08/22/13 [id number];  polyethylene glycol (MIRALAX / GLYCOLAX) packet 17 g, 17 g, Oral, Daily, Thurnell Lose, MD, 17 g at 08/22/13 0944;  tamsulosin (FLOMAX) capsule 0.4 mg, 0.4 mg, Oral, Daily, Samuella Cota, MD, 0.4 mg at 08/22/13 [id number]  Patients Current Diet: Cardiac  Precautions / Restrictions Precautions Precautions: Fall Other Brace/Splint: pt normally uses Lt knee brace at home (does not have here; elastic with 2 velcro straps above and below knee) Restrictions Weight Bearing Restrictions: No   Prior Activity Level Limited Community (1-2x/wk): patient active at home with cane or RW depending on L knee issues  Home Assistive Devices / [place] Devices/Equipment: Environmental consultant (specify type);Bedside commode/3-in-1;Eyeglasses Home Equipment: Walker - 2 wheels;Cane - single point;Bedside commode  Prior Functional Level Prior Function Level of Independence: Independent with assistive device(s) Comments: used cane due to arthritis Lt knee worse than Rt  Current Functional Level Cognition  Overall Cognitive Status: Within Functional Limits for tasks assessed Orientation Level: Oriented X4    Extremity Assessment (includes Sensation/Coordination)          ADLs       Mobility  Bed Mobility: Rolling Right;Right Sidelying to Sit;Sitting - Scoot to Edge of Bed Rolling Right: 5: Supervision;With rail Rolling Left: 5: Supervision Right Sidelying to Sit: 5: Supervision;With rails Supine to Sit: 5: Supervision Sitting - Scoot to Edge of Bed: 5: Supervision    Transfers  Transfers: Sit to Stand;Stand to Sit Sit to Stand: 4: Min assist;With upper extremity assist;From elevated surface;From  bed;From toilet Stand to Sit: 4: Min guard;With upper extremity assist;To chair/3-in-1;To toilet    Ambulation / Gait / Stairs / Wheelchair Mobility  Ambulation/Gait Ambulation/Gait Assistance: 4: Min assist;3: Mod assist Ambulation Distance (Feet): 15 Feet Assistive device: Rolling walker Ambulation/Gait Assistance Details: (A) to manage RW with max cues for upright posture, step sequence and body position within RW.  Pt tends to ambulate to close to RW.  Pt continues to stay in flexed position during ambulation.  Occasional mod (A) needed due to left knee buckle.  Gait Pattern: Step-to pattern;Decreased stride length;Left genu recurvatum;Trunk flexed;Wide base of support Gait velocity: very slow and labored General Gait Details: pt now with significant pain in left knee with weight bearing.  Her feet/ankles are back to normal by her report.  Her knee appears to be at end stage DJD with audible bone crepittus with flexion. Stairs: No Wheelchair Mobility Wheelchair Mobility: No    Posture / Balance  Special needs/care consideration Bowel mgmt: no BM since admission Bladder mgmt: urgency    Previous Home Environment Living Arrangements: Children;Other (Comment) (son, [name], lives with pt)  Lives With: Son Available Help at Discharge: Available PRN/intermittently;Other (Comment) [name] unemployed but is in and out) Type of Home: House Home Layout: One level Home Access: Stairs to enter Entrance Stairs-Rails: Right Entrance Stairs-Number of Steps: 3 Bathroom Shower/Tub: Optometrist: Yes How Accessible: Accessible via walker [place]: No Additional Comments: pt states she was to have PCS services arranged for her to begin at d/c; ? agency  Discharge Living Setting Plans for Discharge Living Setting: Patient's home;Lives with (comment);Other (Comment) (son, [name], lives with pt) Type of Home at Discharge:  House Discharge Home Layout: One level Discharge Home Access: Stairs to enter Entrance Stairs-Rails: Right Entrance Stairs-Number of Steps: 3 steps Discharge Bathroom Shower/Tub: Tub/shower unit Discharge Bathroom Toilet: Standard Discharge Bathroom Accessibility: Yes How Accessible: Accessible via walker Does the patient have any problems obtaining your medications?: No  Social/Family/Support Systems Patient Roles: Parent Contact Information: Heather and Nance Pear Anticipated Caregiver: children prn only Anticipated Caregiver's Contact Information: Nira Conn c [phone]; Elberta Fortis c [phone] Ability/Limitations of Caregiver: [name], son unemployed but in and out; Nira Conn and Elberta Fortis work Caregiver Availability: Intermittent Discharge Plan Discussed with Primary Caregiver: Yes Is Caregiver In Agreement with Plan?: Yes Does Caregiver/Family have Issues with Lodging/Transportation while Pt is in Rehab?: No  Goals/Additional Needs Patient/Family Goal for Rehab: Mod I with PT and OT Expected length of stay: ELOS 1 to 2 weeks Dietary Needs: chronic dysphagia isssues. Take meds in liquid form only. Coughing up phlegm per pt also Special Service Needs: pt requesting injection to her l knee' Has a brace at home she wears for her knee. States she needs a new one Additional Information: Patient has a payee, Marice Potter, who pays her bills. I asked if she wanted me to call her and she says no. Pt/Family Agrees to Admission and willing to participate: Yes Program Orientation Provided & Reviewed with Pt/Caregiver Including Roles  & Responsibilities: Yes Additional Information Needs: Patient states she has a mental illness, schizophrenia, since 2002 which she is disabled. Not eligible for Medicare for she did not work enough quarters. Does not follow up with anyone or take meds. Relies on prayer.    Decrease burden of Care through IP rehab admission: n/a  Possible need for SNF placement upon  discharge:not anticipated  Patient Condition: This patient's condition remains as documented in the consult dated 08/22/2013, in which the Rehabilitation Physician determined and documented that the patient's condition is appropriate for intensive rehabilitative care in an inpatient rehabilitation facility. Will admit to inpatient rehab today.  Preadmission Screen Completed By:  Cleatrice Burke, 08/22/2013 12:13 PM ______________________________________________________________________   Discussed status with Dr. Naaman Plummer on 08/22/2013 at  1212 and received telephone approval for admission today.  Admission Coordinator:  Cleatrice Burke, time [id number] Date 08/22/2013.

## 2013-08-22 NOTE — Evaluation (Signed)
Occupational Therapy Evaluation Patient Details Name: Colleen Nunez MRN: [id number] DOB: 1956-10-17 Today's Date: 08/22/2013 Time: [id number] OT Time Calculation (min): 20 min  OT Assessment / Plan / Recommendation History of present illness Pt admit with CSF leak after C4-5 corpectomy and C6-7 ACF. in July 2014. Now s/p evacuation and lumbar drain (now d/c'd)   Clinical Impression   Patient is s/p above surgery resulting in the deficits listed below (see OT Problem List). Pt reports she was receiving assist with ADLs, PTA. Patient will benefit from skilled OT to increase their independence and safety to allow discharge to next venue of care.     OT Assessment  Patient needs continued OT Services    Follow Up Recommendations  CIR;Supervision/Assistance - 24 hour    Barriers to Discharge Decreased caregiver support    Equipment Recommendations  Other (comment) (defer to next venue)    Recommendations for Other Services    Frequency  Min 2X/week    Precautions / Restrictions Precautions Precautions: Fall Required Braces or Orthoses: Other Brace/Splint Other Brace/Splint: pt normally uses Lt knee brace at home (does not have here; elastic with 2 velcro straps above and below knee) Restrictions Weight Bearing Restrictions: No   Pertinent Vitals/Pain Reported buttocks sore when getting out of bed. Nurse notified.     ADL  Eating/Feeding: Set up Where Assessed - Eating/Feeding: Chair Grooming: Performed;Wash/dry hands;Supervision/safety Where Assessed - Grooming: Supported standing Upper Body Bathing: Set up Where Assessed - Upper Body Bathing: Supported sitting Lower Body Bathing: Minimal assistance Where Assessed - Lower Body Bathing: Supported sit to stand Upper Body Dressing: Minimal assistance Where Assessed - Upper Body Dressing: Supported sitting Lower Body Dressing: Minimal assistance Where Assessed - Lower Body Dressing: Supported sit to Lobbyist:  Magazine features editor Method: Sit to Loss adjuster, chartered: Raised toilet seat with arms (or 3-in-1 over toilet) Toileting - Clothing Manipulation and Hygiene: Min guard Where Assessed - Toileting Clothing Manipulation and Hygiene: Sit to stand from 3-in-1 or toilet Tub/Shower Transfer Method: Not assessed Equipment Used: Gait belt;Rolling walker Transfers/Ambulation Related to ADLs: Min guard for ambulation and transfers. Cues to stand upright and keep walker close. ADL Comments: Pt performed toileting tasks and washed hands at sink. Pt able to don/doff socks while sitting in chair. Min A for balance as she simulated bending and pulling up clothing. Educated on dressing technique.     OT Diagnosis: Acute pain;Generalized weakness  OT Problem List: Decreased strength;Decreased activity tolerance;Impaired balance (sitting and/or standing);Decreased knowledge of use of DME or AE;Decreased knowledge of precautions;Pain;Impaired UE functional use OT Treatment Interventions: Self-care/ADL training;Therapeutic exercise;DME and/or AE instruction;Therapeutic activities;Patient/family education;Balance training   OT Goals(Current goals can be found in the care plan section) Acute Rehab OT Goals Patient Stated Goal: go home-states her birthday is soon OT Goal Formulation: With patient Time For Goal Achievement: 08/29/13 Potential to Achieve Goals: Good ADL Goals Pt Will Perform Grooming: with modified independence;standing Pt Will Perform Lower Body Bathing: with modified independence;sit to/from stand Pt Will Perform Lower Body Dressing: with modified independence;sit to/from stand Pt Will Transfer to Toilet: with modified independence;ambulating (3 in 1 over commode) Pt Will Perform Toileting - Clothing Manipulation and hygiene: with modified independence;sit to/from stand  Visit Information  Last OT Received On: 08/22/13 Assistance Needed: +1 History of Present Illness: Pt admit  with CSF leak after C4-5 corpectomy and C6-7 ACF. in July 2014. Now s/p evacuation and lumbar drain (now d/c'd)  Prior Functioning     Home Living Family/patient expects to be discharged to:: Private residence Living Arrangements: Children;Other (Comment) (40 y.o. son, [name], lives with pt. He is unemployed) Available Help at Discharge: Available PRN/intermittently;Other (Comment) [name] unemployed but is in and out) Type of Home: House Home Access: Stairs to enter Technical brewer of Steps: 3 Entrance Stairs-Rails: Right Home Layout: One level Home Equipment: Environmental consultant - 2 wheels;Cane - single point;Bedside commode Additional Comments: pt states she was to have PCS services arranged for her to begin at d/c; ? agency  Lives With: Son Prior Function Level of Independence: Needs assistance ADL's / Homemaking Assistance Needed: reports that son helps with bathing and dressing Comments: used cane due to arthritis Lt knee worse than Rt Communication Communication: No difficulties Dominant Hand: Right         Vision/Perception     Cognition  Cognition Arousal/Alertness: Awake/alert Behavior During Therapy: WFL for tasks assessed/performed Overall Cognitive Status: Within Functional Limits for tasks assessed    Extremity/Trunk Assessment Upper Extremity Assessment Upper Extremity Assessment: Overall WFL for tasks assessed LUE Deficits / Details: per PT, LUE-triceps 3+. Also, reports arthritis in left hand Lower Extremity Assessment Lower Extremity Assessment: Defer to PT evaluation     Mobility Bed Mobility Bed Mobility: Rolling Left;Left Sidelying to Sit;Sit to Supine Rolling Left: 5: Supervision Left Sidelying to Sit: 5: Supervision;HOB flat Sit to Supine: 5: Supervision;HOB flat Transfers Transfers: Sit to Stand;Stand to Sit Sit to Stand: 4: Min guard;With upper extremity assist;From bed;From chair/3-in-1 Stand to Sit: 4: Min guard;With upper extremity  assist;To chair/3-in-1;To bed Details for Transfer Assistance: Min guard for safety. Cues for technique and hand placement.     Exercise     Balance     End of Session OT - End of Session Equipment Utilized During Treatment: Gait belt;Rolling walker Activity Tolerance: Patient tolerated treatment well Patient left: in bed;with call bell/phone within reach;with bed alarm set Nurse Communication: Other (comment) (buttocks hurts)  GO     Benito Mccreedy OTR/L [id number] 08/22/2013, 12:43 PM

## 2013-08-23 ENCOUNTER — Inpatient Hospital Stay (HOSPITAL_COMMUNITY): Payer: Medicaid Other

## 2013-08-23 ENCOUNTER — Inpatient Hospital Stay (HOSPITAL_COMMUNITY): Payer: Medicaid Other | Admitting: Physical Therapy

## 2013-08-23 ENCOUNTER — Inpatient Hospital Stay (HOSPITAL_COMMUNITY): Payer: Medicaid Other | Admitting: Occupational Therapy

## 2013-08-23 LAB — CBC WITH DIFFERENTIAL/PLATELET
Basophils Absolute: 0 10*3/uL (ref 0.0–0.1)
Basophils Relative: 0 % (ref 0–1)
Eosinophils Absolute: 0.2 10*3/uL (ref 0.0–0.7)
Eosinophils Relative: 2 % (ref 0–5)
HCT: 28.5 % — ABNORMAL LOW (ref 36.0–46.0)
Hemoglobin: 9.6 g/dL — ABNORMAL LOW (ref 12.0–15.0)
Lymphs Abs: 3.6 10*3/uL (ref 0.7–4.0)
MCH: 26.7 pg (ref 26.0–34.0)
MCHC: 33.7 g/dL (ref 30.0–36.0)
MCV: 79.4 fL (ref 78.0–100.0)
Monocytes Absolute: 0.9 10*3/uL (ref 0.1–1.0)
Monocytes Relative: 9 % (ref 3–12)
Neutro Abs: 5.1 10*3/uL (ref 1.7–7.7)
RBC: 3.59 MIL/uL — ABNORMAL LOW (ref 3.87–5.11)
RDW: 15.7 % — ABNORMAL HIGH (ref 11.5–15.5)

## 2013-08-23 LAB — COMPREHENSIVE METABOLIC PANEL
Albumin: 2.3 g/dL — ABNORMAL LOW (ref 3.5–5.2)
Alkaline Phosphatase: 75 U/L (ref 39–117)
BUN: 20 mg/dL (ref 6–23)
Calcium: 9.2 mg/dL (ref 8.4–10.5)
Chloride: 102 mEq/L (ref 96–112)
Creatinine, Ser: 1.34 mg/dL — ABNORMAL HIGH (ref 0.50–1.10)
GFR calc Af Amer: 50 mL/min — ABNORMAL LOW (ref >90)
Glucose, Bld: 106 mg/dL — ABNORMAL HIGH (ref 70–99)
Potassium: 4 mEq/L (ref 3.5–5.1)
Total Protein: 6.3 g/dL (ref 6.0–8.3)

## 2013-08-23 LAB — URINE CULTURE: Culture: NO GROWTH

## 2013-08-23 MED ORDER — ENSURE COMPLETE PO LIQD
237.0000 mL | ORAL | Status: DC
  Administered 2013-08-28: 237 mL via ORAL

## 2013-08-23 MED ORDER — BETHANECHOL CHLORIDE 25 MG PO TABS
25.0000 mg | ORAL_TABLET | Freq: Four times a day (QID) | ORAL | Status: DC
  Administered 2013-08-23 – 2013-08-25 (×11): 25 mg via ORAL
  Filled 2013-08-23 (×16): qty 1

## 2013-08-23 NOTE — Progress Notes (Signed)
Occupational Therapy Note  Patient Details  Name: Colleen Nunez MRN: [id number] Date of Birth: November 13, 1956 Today's Date: 08/23/2013  Time: 1300-1330 Pt denies pain Individual therapy  Pt resting in w/c upon arrival and agreeable to participating in therapy. Pt practiced tub bench transfers in ADL apartment.  Pt amb with RW into bathroom and performed transfer with steady A.  Pt hyperextends Lt knee during transfers and when ambulating.  Pt also practiced bed mobility including sit<>supine and sit<>stand.  Discussed kitchen set up at home and initiated RW safety and kitchen safety education.   [name] [facility] 08/23/2013, 3:19 PM

## 2013-08-23 NOTE — Evaluation (Signed)
Speech Language Pathology Assessment and Plan  Patient Details  Name: Colleen Nunez MRN: [id number] Date of Birth: 03-16-1956  Today's Date: 08/23/2013 Time: 1400-1500 Time Calculation (min): 60 min  Problem List:  Patient Active Problem List   Diagnosis Date Noted  . Hypokalemia 08/16/2013  . AKI (acute kidney injury) 08/16/2013  . UTI (lower urinary tract infection) 08/07/2013  . Nausea & vomiting 08/07/2013  . Chronic kidney disease, stage IV (severe) 08/07/2013  . Cervical myelopathy 08/07/2013  . GERD 06/12/2009  . CHEST PAIN 06/12/2009  . LEG PAIN, BILATERAL 05/28/2009  . OBESITY 12/17/2007  . DYSPHAGIA UNSPECIFIED 12/03/2007  . ANEMIA, NORMOCYTIC 07/13/2007  . ALCOHOL ABUSE 07/13/2007  . RENAL DISEASE, CHRONIC, STAGE II 07/13/2007  . ANXIETY 06/29/2007  . DEPRESSION 06/29/2007  . HYPERTENSION 06/29/2007  . CONSTIPATION 06/29/2007  . ARTHRITIS 06/29/2007  . MALAISE AND FATIGUE 06/29/2007   Past Medical History:  Past Medical History  Diagnosis Date  . Hypertension   . Arthritis   . Hepatitis C antibody test positive        . Renal insufficiency   . Anxiety   . Depression     h/o suicide attempts in the past  . GERD (gastroesophageal reflux disease)   . Polysubstance abuse     h/o  . Anemia   . Gout    Past Surgical History:  Past Surgical History  Procedure Laterality Date  . Knee arthroscopy    . Abdominal hysterectomy    . Cartilage removal      left knee  . Anterior cervical corpectomy N/A 06/09/2013    Procedure: C4 C5 Corpectomy with C6-7 Anterior cervical fusion/Peek cage/Trestle plate;  Surgeon: Otilio Connors, MD;  Location: [place] [facility];  Service: Neurosurgery;  Laterality: N/A;  Cervical Four, Cervical Five Corpectomy and Cervical six-seven Anterior cervical fusion/Peek cage Three-Five /Trestle Plating Cervical Three to Cervical Seven  . X-stop implantation    . Bilateral soo and appendectomy  2006  . Anterior cervical decomp/discectomy  fusion N/A 08/12/2013    Procedure: Repair of Anterior  Cervical CSF LEAK, lumbar drain placement.;  Surgeon: Floyce Stakes, MD;  Location: [place];  Service: Neurosurgery;  Laterality: N/A;    Assessment / Plan / Recommendation Clinical Impression  Pt is a 57 y.o. female with history of ACDF for stenosis with myelopathy 05/2013 and continued BLE weakness as well as recent gout flare up with left knee and ankle pain. Admitted on 08/07/13 to [facility] with N/V and difficulty swallowing. CT neck with with very large prevertebral but trans-spatial fluid collection encompassing 8.6 x 7.3 x 11.6 cm c/w a postoperative CSF leak and patient transferred to [facility] on 08/12/13. She was taken to OR the same day for evacuation of fluid collection and Insertion of spinal drai by Dr. Joya Salm. Swallow evaluation done showed no signs of dysphagia and regular textures, thin liquids recommended. Kleb Pneumo pyelonephritis treated with 10 day course of rocephin. Patient has had woresening of renal status and renal u/s with evidence of medical renal disease on right and unable to ID left kidney. Orthopanthogram done due to complaints of dental pain--limited due to cervical hardware. She was started on Augmentin on 10/04 with improvement of symptoms. Therapies initiated and patient limited by LLE pain with knee instability as well as decreased posture. CIR recommended by therapy team and patient admitted 10/06. Cognitive-linguistic and bedside swallow evaluations completed. Pt appears to be at her baseline level of functioning for cognition and language. Her vocal  quality is hoarse with intermittent periods of aphonia, and may benefit from an OP SLP vs ENT evaluation if this does not improve. Oropharyngeal swallowing function appears to be [facility], and pt has been consuming regular textures and thin liquids since 9/29 with CXR 10/6 without signs of infection. At this time, no further SLP f/u is recommended for this inpatient stay.     SLP  Assessment  Patient does not need any further Speech Lanaguage Pathology Services    Recommendations  Diet Recommendations: Regular;Thin liquid Liquid Administration via: Cup;Straw;No straw Medication Administration: Whole meds with liquid Supervision: Patient able to self feed;Intermittent supervision to cue for compensatory strategies Compensations: Slow rate;Small sips/bites Postural Changes and/or Swallow Maneuvers: Seated upright 90 degrees Oral Care Recommendations: Oral care BID Patient destination: Home Equipment Recommended: None recommended by SLP    SLP Frequency   N/A  SLP Treatment/Interventions   N/A   Pain Pain Assessment Pain Assessment: No/denies pain Prior Functioning Cognitive/Linguistic Baseline: Within functional limits Type of Home: House Available Help at Discharge: Available PRN/intermittently Vocation: On disability  Short Term Goals: N/A  See FIM for current functional status Refer to Care Plan for Long Term Goals  Recommendations for other services: None  Discharge Criteria: Patient will be discharged from SLP if patient refuses treatment 3 consecutive times without medical reason, if treatment goals not met, if there is a change in medical status, if patient makes no progress towards goals or if patient is discharged from hospital.  The above assessment, treatment plan, treatment alternatives and goals were discussed and mutually agreed upon: by patient  Colleen Nunez 08/23/2013, 4:34 PM  Colleen Nunez, M.A. CCC-SLP [phone]

## 2013-08-23 NOTE — Progress Notes (Signed)
[facility] Individual Statement of Services  Patient Name:  Colleen Nunez  Date:  08/23/2013  Welcome to the [facility].  Our goal is to provide you with an individualized program based on your diagnosis and situation, designed to meet your specific needs.  With this comprehensive rehabilitation program, you will be expected to participate in at least 3 hours of rehabilitation therapies Monday-Friday, with modified therapy programming on the weekends.  Your rehabilitation program will include the following services:  Physical Therapy (PT), Occupational Therapy (OT), Speech Therapy (ST), 24 hour per day rehabilitation nursing, Neuropsychology, Case Management (Social Worker), Rehabilitation Medicine, Nutrition Services and Pharmacy Services  Weekly team conferences will be held on Tuesdays to discuss your progress.  Your Social Worker will talk with you frequently to get your input and to update you on team discussions.  Team conferences with you and your family in attendance may also be held.  Expected length of stay:   7 to 10 days     Overall anticipated outcome:  Modified Independent - Supervision for stairs, ambulation, car transfers, and homemaking  Depending on your progress and recovery, your program may change. Your Social Worker will coordinate services and will keep you informed of any changes. Your Social Worker's name and contact numbers are listed  below.  The following services may also be recommended but are not provided by the [facility] will be made to provide these services after discharge if needed.  Arrangements include referral to agencies that provide these services.  Your insurance has been verified to be:  Medicaid Your primary doctor is:  Dr. Rosita Fire  Pertinent information will be  shared with your doctor and your insurance company.  Social Worker:  Alfonse Alpers, LCSW  [phone] or [phone]  Information discussed with and copy given to patient by: Trey Sailors, 08/23/2013, 11:51 AM

## 2013-08-23 NOTE — Progress Notes (Signed)
Patient information reviewed and entered into eRehab system by Shriya Aker, RN, CRRN, PPS Coordinator.  Information including medical coding and functional independence measure will be reviewed and updated through discharge.    

## 2013-08-23 NOTE — Progress Notes (Addendum)
INITIAL NUTRITION ASSESSMENT  DOCUMENTATION CODES Per approved criteria  -Obesity Unspecified   INTERVENTION: 1.  Supplements; Ensure Complete po once daily, each supplement provides 350 kcal and 13 grams of protein. 2.  MVI daily  NUTRITION DIAGNOSIS: Increased nutrient needs related to increased energy expenditure AEB intensive rehab program  Monitor:  1.  Food/Beverage; pt meeting >/=90% estimated needs with tolerance. 2.  Wt/wt change; monitor trends  Reason for Assessment: MST  57 y.o. female  Admitting Dx: Cervical myelopathy  ASSESSMENT:  Pt has hx of cervical stenosis s/p cervical surgery. C/o emesis since procedure in July. Right foot pain today (elevated uric acid level). Stage IV kidney dz and UTI. Follows Regular diet at home.  Pt states appetite and intake are improved compared to acute inpatient stay.  Pt intake has been largely 75-100% of meals and she is drinking Ensure as ordered.  Will decreased Ensure to once daily as pt's intake at meals is improved.   Pt with h/o chronic wt loss, however acutely stable with adequate PO intake.   Height: Ht Readings from Last 1 Encounters:  08/22/13 5\' 2"  (1.575 m)    Weight: Wt Readings from Last 1 Encounters:  08/22/13 171 lb (77.565 kg)    Ideal Body Weight: 50 kg  % Ideal Body Weight: 155%  Wt Readings from Last 10 Encounters:  08/22/13 171 lb (77.565 kg)  08/19/13 170 lb 10.2 oz (77.4 kg)  08/19/13 170 lb 10.2 oz (77.4 kg)  06/10/13 190 lb 11.2 oz (86.5 kg)  06/10/13 190 lb 11.2 oz (86.5 kg)  06/03/13 182 lb (82.555 kg)  11/24/12 190 lb (86.183 kg)  03/24/12 188 lb (85.276 kg)  06/12/09 199 lb (90.266 kg)  05/28/09 198 lb (89.812 kg)    Usual Body Weight: 190 lbs  % Usual Body Weight: 90%  BMI:  Body mass index is 31.27 kg/(m^2).  Estimated Nutritional Needs: Kcal: 1750-1900 Protein: 100-115 g Fluid: 1.8-2.0 L/day  Skin: intact   Diet Order: General  EDUCATION NEEDS: -No education needs  identified at this time   Intake/Output Summary (Last 24 hours) at 08/23/13 1455 Last data filed at 08/23/13 1300  Gross per 24 hour  Intake    360 ml  Output    230 ml  Net    130 ml    Last BM: 10/6  Labs:   Recent Labs Lab 08/17/13 0520 08/18/13 0443 08/20/13 0550 08/23/13 0535  NA 137 141 141 138  K 3.9 3.5 3.8 4.0  CL 102 107 106 102  CO2 20 22 23 25   BUN 11 13 16 20   CREATININE 1.24* 1.37* 1.17* 1.34*  CALCIUM 8.5 8.2* 8.1* 9.2  MG 1.0*  --   --   --   GLUCOSE 75 100* 108* 106*    CBG (last 3)  No results found for this basename: GLUCAP,  in the last 72 hours  Scheduled Meds: . amLODipine  10 mg Oral Daily  . amoxicillin-clavulanate  875 mg Oral Q12H  . bethanechol  25 mg Oral QID  . chlorhexidine  15 mL Mouth/Throat QID  . diclofenac sodium  2 g Topical QID  . enoxaparin (LOVENOX) injection  40 mg Subcutaneous Q24H  . feeding supplement (ENSURE COMPLETE)  237 mL Oral BID BM  . pantoprazole  40 mg Oral BID AC  . polyethylene glycol  17 g Oral Daily  . saccharomyces boulardii  250 mg Oral BID  . tamsulosin  0.4 mg Oral Daily  Continuous Infusions:    Past Medical History  Diagnosis Date  . Hypertension   . Arthritis   . Hepatitis C antibody test positive        . Renal insufficiency   . Anxiety   . Depression     h/o suicide attempts in the past  . GERD (gastroesophageal reflux disease)   . Polysubstance abuse     h/o  . Anemia   . Gout     Past Surgical History  Procedure Laterality Date  . Knee arthroscopy    . Abdominal hysterectomy    . Cartilage removal      left knee  . Anterior cervical corpectomy N/A 06/09/2013    Procedure: C4 C5 Corpectomy with C6-7 Anterior cervical fusion/Peek cage/Trestle plate;  Surgeon: Otilio Connors, MD;  Location: [facility];  Service: Neurosurgery;  Laterality: N/A;  Cervical Four, Cervical Five Corpectomy and Cervical six-seven Anterior cervical fusion/Peek cage Three-Five /Trestle Plating  Cervical Three to Cervical Seven  . X-stop implantation    . Bilateral soo and appendectomy  2006  . Anterior cervical decomp/discectomy fusion N/A 08/12/2013    Procedure: Repair of Anterior  Cervical CSF LEAK, lumbar drain placement.;  Surgeon: Floyce Stakes, MD;  Location: [facility];  Service: Neurosurgery;  Laterality: N/A;    Brynda Greathouse, MS RD LDN Clinical Inpatient Dietitian Pager: [phone] Weekend/After hours pager: [phone]

## 2013-08-23 NOTE — Evaluation (Signed)
Physical Therapy Assessment and Plan  Patient Details  Name: Colleen Nunez MRN: [id number] Date of Birth: 01/22/1956  PT Diagnosis: Abnormal posture, Abnormality of gait, Cognitive deficits, Difficulty walking and Muscle weakness Rehab Potential: Good ELOS: 10-12 days   Today's Date: 08/23/2013 Time: 0830-0930 Time Calculation (min): 60 min  Problem List:  Patient Active Problem List   Diagnosis Date Noted  . Hypokalemia 08/16/2013  . AKI (acute kidney injury) 08/16/2013  . UTI (lower urinary tract infection) 08/07/2013  . Nausea & vomiting 08/07/2013  . Chronic kidney disease, stage IV (severe) 08/07/2013  . Cervical myelopathy 08/07/2013  . GERD 06/12/2009  . CHEST PAIN 06/12/2009  . LEG PAIN, BILATERAL 05/28/2009  . OBESITY 12/17/2007  . DYSPHAGIA UNSPECIFIED 12/03/2007  . ANEMIA, NORMOCYTIC 07/13/2007  . ALCOHOL ABUSE 07/13/2007  . RENAL DISEASE, CHRONIC, STAGE II 07/13/2007  . ANXIETY 06/29/2007  . DEPRESSION 06/29/2007  . HYPERTENSION 06/29/2007  . CONSTIPATION 06/29/2007  . ARTHRITIS 06/29/2007  . MALAISE AND FATIGUE 06/29/2007    Past Medical History:  Past Medical History  Diagnosis Date  . Hypertension   . Arthritis   . Hepatitis C antibody test positive        . Renal insufficiency   . Anxiety   . Depression     h/o suicide attempts in the past  . GERD (gastroesophageal reflux disease)   . Polysubstance abuse     h/o  . Anemia   . Gout    Past Surgical History:  Past Surgical History  Procedure Laterality Date  . Knee arthroscopy    . Abdominal hysterectomy    . Cartilage removal      left knee  . Anterior cervical corpectomy N/A 06/09/2013    Procedure: C4 C5 Corpectomy with C6-7 Anterior cervical fusion/Peek cage/Trestle plate;  Surgeon: Otilio Connors, MD;  Location: [facility];  Service: Neurosurgery;  Laterality: N/A;  Cervical Four, Cervical Five Corpectomy and Cervical six-seven Anterior cervical fusion/Peek cage Three-Five /Trestle  Plating Cervical Three to Cervical Seven  . X-stop implantation    . Bilateral soo and appendectomy  2006  . Anterior cervical decomp/discectomy fusion N/A 08/12/2013    Procedure: Repair of Anterior  Cervical CSF LEAK, lumbar drain placement.;  Surgeon: Floyce Stakes, MD;  Location: [facility];  Service: Neurosurgery;  Laterality: N/A;    Assessment & Plan Clinical Impression: Colleen Nunez is a 57 y.o. female with history of ACDF for stenosis with myelopathy 05/2013 and continued BLE weakness as well as recent gout flare up with left knee and ankle pain. Admitted on 08/07/13 to [facility] with N/V and difficulty swallowing. CT neck with with very large prevertebral but trans-spatial fluid collection encompassing 8.6 x 7.3 x 11.6 cm c/w a postoperative CSF leak and patient transferred to [facility] on 08/12/13. She was taken to OR the same day for evacuation of fluid collection and Insertion of spinal drai by Dr. Joya Salm. Swallow evaluation done showed no signs of dysphagia and regular textures, thin liquids recommended. Kleb Pneumo pyelonephritis treated with 10 day course of rocephin. Patient has had woresening of renal status and renal u/s with evidence of medical renal disease on right and unable to ID left kidney. Orthopanthogram done due to complaints of dental pain--limited due to cervical hardware. She was started on Augmentin on 10/04 with improvement of symptoms.  Patient transferred to [facility] on 08/22/2013 .   Patient currently requires mod with mobility secondary to muscle weakness, impaired timing and sequencing, decreased problem  solving and delayed processing and decreased standing balance and decreased balance strategies.  Pt currently demonstrates decreased standing balance and increased need for assist during transfers as compared to baseline. She tends to hyperextend bil. LEs in standing for stability, has a good amount of bil. Knee valgus and reports Lt. Knee pain from OA which limits standing mobility.  Prior to hospitalization, patient was modified independent  with mobility using a cane for ambulation and lived with Son in a House home.  Home access is 4Stairs to enter.  Patient will benefit from skilled PT intervention to maximize safe functional mobility, minimize fall risk and decrease caregiver burden for planned discharge home with intermittent assist.  Anticipate patient will benefit from follow up [facility] at discharge.  PT - End of Session Endurance Deficit: Yes PT Assessment Rehab Potential: Good Barriers to Discharge: Decreased caregiver support Barriers to Discharge Comments: Will not have 24/7 supervision/assist.  PT Patient demonstrates impairments in the following area(s): Balance;Endurance PT Transfers Functional Problem(s): Bed Mobility;Bed to Chair;Car;Furniture PT Locomotion Functional Problem(s): Ambulation;Wheelchair Mobility;Stairs PT Plan PT Intensity: Minimum of 1-2 x/day ,45 to 90 minutes PT Frequency: 5 out of 7 days PT Duration Estimated Length of Stay: 10-12 days PT Treatment/Interventions: Ambulation/gait training;Balance/vestibular training;Cognitive remediation/compensation;Discharge planning;Community reintegration;DME/adaptive equipment instruction;Neuromuscular re-education;Patient/family education;Therapeutic Exercise;UE/LE Coordination activities;Therapeutic Activities;Psychosocial support;Stair training;UE/LE Strength taining/ROM;Wheelchair propulsion/positioning PT Transfers Anticipated Outcome(s): Modified independent  PT Locomotion Anticipated Outcome(s): Supervision PT Recommendation Follow Up Recommendations: Home health PT (supervision for steps and ambulation) Patient destination: Home  Skilled Therapeutic Intervention Evaluation somewhat limited by loose bowels initially. Pt up to mod assist for ambulation to/from bathroom with RW with Lt. Knee buckling intermittently, incontinent of bowels prior to reaching toilet. Standing balance with one UE then  without UE support up to mod assist for posterior loss of balance. Bil. Knee recurvatum with standing activities, pt reports this is long standing. Worked on set up for transfers and UE and RW positioning for safety during transfers.  HR up to 120 during session, pt reports feeling "hot" which resolves with rest and cues for breathing.   PT Evaluation Precautions/Restrictions Precautions Precautions: Fall Required Braces or Orthoses: Other Brace/Splint Other Brace/Splint: pt normally uses Lt knee brace at home (does not have here; elastic with 2 velcro straps above and below knee) Restrictions Weight Bearing Restrictions: No Pain Pain Assessment Pain Assessment: No/denies pain Home Living/Prior Functioning Home Living Available Help at Discharge: Available PRN/intermittently;Other (Comment) [name] unemployed but is in and out) Type of Home: House Home Access: Stairs to enter Technical brewer of Steps: 4 Entrance Stairs-Rails: Right Home Layout: One level  Lives With: Son Prior Function Level of Independence: Requires assistive device for independence (cane)  Able to Take Stairs?: Yes Driving: Yes Vocation: On disability Leisure: Hobbies-yes (Comment) Comments: likes to play Bingo and cards, shopping   Cognition Overall Cognitive Status: Within Functional Limits for tasks assessed Arousal/Alertness: Awake/alert Comments: Slower processing but appears to have good safety awareness on evaluation Sensation Sensation Light Touch: Appears Intact (bil. LEs) Proprioception: Appears Intact Coordination Gross Motor Movements are Fluid and Coordinated: Yes Fine Motor Movements are Fluid and Coordinated: Yes  Mobility Bed Mobility Rolling Left: 5: Supervision Left Sidelying to Sit: 5: Supervision;With rails;HOB flat Transfers Transfers: Yes Sit to Stand: 4: Min assist;3: Mod assist;From toilet;From bed;From chair/3-in-1 Sit to Stand Details (indicate cue type and  reason): More assist needed from lower surfaces, delayed uprighting of trunk. Pt hyperextends bil. LEs to gain stability.  Stand to Sit: With upper extremity  assist;To chair/3-in-1;To bed Stand to Sit Details: Cues for UE positioning and to control descent.  Locomotion  Ambulation Ambulation/Gait Assistance: 3: Mod assist Ambulation Distance (Feet): 15 Feet (x2) Assistive device: Rolling walker Ambulation/Gait Assistance Details: Assist to negotiate walker, verbal and tactile cues for upright posture. Lt. knee instability noted however did not give way. Pt relys on bil. LE hyperextension for improved stability during stance on each LE. Pt with somewhat antalgic gait due to Lt. knee pain. Crepitus present audible from bil. knees.  Gait Gait: Yes Gait Pattern: Impaired Gait Pattern: Step-to pattern;Decreased stance time - left;Decreased stride length;Decreased hip/knee flexion - left;Decreased hip/knee flexion - right;Trunk flexed (bil. genu valgus) Stairs / Additional Locomotion Stairs: Yes Stairs Assistance: 3: Mod assist Stairs Assistance Details (indicate cue type and reason): Pt demonstration of sequencing. Pt returned demonstrated well but limited by generalized weakness and knee pain, required mod assist. Cues for posture, pt very flexed.  Stair Management Technique: Two rails;Step to pattern;Forwards Number of Stairs: 3 Height of Stairs: 4 (inch) Wheelchair Mobility Wheelchair Mobility: Yes Wheelchair Assistance: 4: Advertising account executive Details: Verbal cues for safe use of DME/AE;Verbal cues for Astronomer: Both upper extremities Wheelchair Parts Management: Needs assistance Distance: 45'  Trunk/Postural Assessment  Cervical Assessment Cervical Assessment: Exceptions to [facility] Cervical AROM Overall Cervical AROM Comments: guarded movements from surgery Thoracic Assessment Thoracic Assessment: Exceptions to [facility] Thoracic Strength Overall Thoracic  Strength Comments: kyphotic Lumbar Assessment Lumbar Assessment: Exceptions to [facility] Lumbar Strength Overall Lumbar Strength Comments: impaired functional strength/endurance. Pt progressively becomes more flexed through trunk with ambulation and standing activity Postural Control Postural Control: Deficits on evaluation Righting Reactions: Delayed in standing  Balance Balance Balance Assessed: Yes Static Sitting Balance Static Sitting - Balance Support: Feet supported;No upper extremity supported Static Sitting - Level of Assistance: 5: Stand by assistance Static Standing Balance Static Standing - Balance Support: Bilateral upper extremity supported Static Standing - Level of Assistance: 4: Min assist Static Standing - Comment/# of Minutes: Posterior loss of balance Dynamic Standing Balance Dynamic Standing - Balance Support: Bilateral upper extremity supported;Left upper extremity supported Dynamic Standing - Level of Assistance: 3: Mod assist Dynamic Standing - Balance Activities: Reaching for objects;Forward lean/weight shifting;Lateral lean/weight shifting Dynamic Standing - Comments: Again pt has tendency to fall posteriorly Extremity Assessment      RLE Assessment RLE Assessment: Exceptions to [facility] RLE Strength RLE Overall Strength Comments: Generalized deconditioning, grossly >/=3+/5 LLE Assessment LLE Assessment: Exceptions to [facility] LLE Strength LLE Overall Strength Comments: Generalized deconditioning, grossly >/=3+/5  FIM:  FIM - Locomotion: Wheelchair Distance: 45' FIM - Locomotion: Ambulation Ambulation/Gait Assistance: 3: Mod assist   Refer to Care Plan for Long Term Goals  Recommendations for other services: None  Discharge Criteria: Patient will be discharged from PT if patient refuses treatment 3 consecutive times without medical reason, if treatment goals not met, if there is a change in medical status, if patient makes no progress towards goals or if patient  is discharged from hospital.  The above assessment, treatment plan, treatment alternatives and goals were discussed and mutually agreed upon: by patient  Lahoma Rocker 08/23/2013, 9:58 AM

## 2013-08-23 NOTE — Progress Notes (Signed)
RN discontinued lumbar staples (x2) this AM with no complications. Skin clean, dry, intact, no dressings. Patient stable, no complaints of pain or discomfort.

## 2013-08-23 NOTE — Progress Notes (Signed)
Chaplain responded to Order Requisition for "anxiety/coping."  Pt just finished eating dinner and was watching tv when I arrived. Pt appeared relaxed. Chaplain facilitated safe/supportive environment and engaged in active/reflective listening. Pt shared her story and talked about how hard things have been for her lately.  Chaplain listened empathetically and provided emotional support and encouragement.  Follow up is recommended.   [place]

## 2013-08-23 NOTE — Evaluation (Signed)
Occupational Therapy Assessment and Plan & Session Note  Patient Details  Name: Colleen Nunez MRN: [id number] Date of Birth: 1956/03/24  OT Diagnosis: abnormal posture, acute pain and muscle weakness (generalized) Rehab Potential: Rehab Potential: Good ELOS: 7-10 days   Today's Date: 08/23/2013  Problem List:  Patient Active Problem List   Diagnosis Date Noted  . Hypokalemia 08/16/2013  . AKI (acute kidney injury) 08/16/2013  . UTI (lower urinary tract infection) 08/07/2013  . Nausea & vomiting 08/07/2013  . Chronic kidney disease, stage IV (severe) 08/07/2013  . Cervical myelopathy 08/07/2013  . GERD 06/12/2009  . CHEST PAIN 06/12/2009  . LEG PAIN, BILATERAL 05/28/2009  . OBESITY 12/17/2007  . DYSPHAGIA UNSPECIFIED 12/03/2007  . ANEMIA, NORMOCYTIC 07/13/2007  . ALCOHOL ABUSE 07/13/2007  . RENAL DISEASE, CHRONIC, STAGE II 07/13/2007  . ANXIETY 06/29/2007  . DEPRESSION 06/29/2007  . HYPERTENSION 06/29/2007  . CONSTIPATION 06/29/2007  . ARTHRITIS 06/29/2007  . MALAISE AND FATIGUE 06/29/2007    Past Medical History:  Past Medical History  Diagnosis Date  . Hypertension   . Arthritis   . Hepatitis C antibody test positive        . Renal insufficiency   . Anxiety   . Depression     h/o suicide attempts in the past  . GERD (gastroesophageal reflux disease)   . Polysubstance abuse     h/o  . Anemia   . Gout    Past Surgical History:  Past Surgical History  Procedure Laterality Date  . Knee arthroscopy    . Abdominal hysterectomy    . Cartilage removal      left knee  . Anterior cervical corpectomy N/A 06/09/2013    Procedure: C4 C5 Corpectomy with C6-7 Anterior cervical fusion/Peek cage/Trestle plate;  Surgeon: Otilio Connors, MD;  Location: [place] [facility];  Service: Neurosurgery;  Laterality: N/A;  Cervical Four, Cervical Five Corpectomy and Cervical six-seven Anterior cervical fusion/Peek cage Three-Five /Trestle Plating Cervical Three to Cervical Seven  .  X-stop implantation    . Bilateral soo and appendectomy  2006  . Anterior cervical decomp/discectomy fusion N/A 08/12/2013    Procedure: Repair of Anterior  Cervical CSF LEAK, lumbar drain placement.;  Surgeon: Floyce Stakes, MD;  Location: [place];  Service: Neurosurgery;  Laterality: N/A;   Clinical Impression:Colleen Nunez is a 57 y.o. female with history of ACDF for stenosis with myelopathy 05/2013 and continued BLE weakness as well as recent gout flare up with left knee and ankle pain. Admitted on 08/07/13 to [facility] with N/V and difficulty swallowing. CT neck with with very large prevertebral but trans-spatial fluid collection encompassing 8.6 x 7.3 x 11.6 cm c/w a postoperative CSF leak and patient transferred to [facility] on 08/12/13. She was taken to OR the same day for evacuation of fluid collection and Insertion of spinal drai by Dr. Joya Salm. Swallow evaluation done showed no signs of dysphagia and regular textures, thin liquids recommended. Kleb Pneumo pyelonephritis treated with 10 day course of rocephin. Patient has had woresening of renal status and renal u/s with evidence of medical renal disease on right and unable to ID left kidney. Orthopanthogram done due to complaints of dental pain--limited due to cervical hardware. She was started on Augmentin on 10/04 with improvement of symptoms.  herapies initiated and patient limited by LLE pain with knee instability as well as decreased posture. CIR recommended by therapy team and patient admitted today. Patient transferred to CIR on 08/22/2013 .    Patient  currently requires min with basic self-care skills and IADL secondary to muscle weakness and muscle joint tightness and decreased sitting balance, decreased standing balance, decreased postural control and decreased balance strategies.  Prior to hospitalization, patient could complete ADLs/IADLs at a mod I level.  Patient will benefit from skilled intervention to increase independence with basic  self-care skills prior to discharge home independently.  Anticipate patient will require intermittent supervision and additional occupational therapy is TBD.  OT - End of Session Activity Tolerance: Tolerates 30+ min activity with multiple rests Endurance Deficit: Yes OT Assessment Rehab Potential: Good Barriers to Discharge: Decreased caregiver support (patient states she will hire someone) OT Patient demonstrates impairments in the following area(s): Balance;Endurance;Motor;Pain;Safety;Skin Integrity OT Basic ADL's Functional Problem(s): Grooming;Bathing;Dressing;Toileting OT Advanced ADL's Functional Problem(s): Simple Meal Preparation;Light Housekeeping OT Transfers Functional Problem(s): Toilet;Tub/Shower OT Additional Impairment(s): None (patient can benefit from BUE strengthening exercises) OT Plan OT Intensity: Minimum of 1-2 x/day, 45 to 90 minutes OT Frequency: 5 out of 7 days OT Duration/Estimated Length of Stay: 7-10 days OT Treatment/Interventions: Balance/vestibular training;Community reintegration;Discharge planning;DME/adaptive equipment instruction;Functional mobility training;Pain management;Psychosocial support;Patient/family education;Self Care/advanced ADL retraining;Skin care/wound managment;Splinting/orthotics;Therapeutic Activities;Therapeutic Exercise;UE/LE Strength taining/ROM;UE/LE Coordination activities;Wheelchair propulsion/positioning OT Self Feeding Anticipated Outcome(s): Independent OT Basic Self-Care Anticipated Outcome(s): Mod I OT Toileting Anticipated Outcome(s): Mod I OT Bathroom Transfers Anticipated Outcome(s): Mod I OT Recommendation Patient destination: Home Follow Up Recommendations: Other (comment) (additional OT is TBD) Equipment Recommended: 3 in 1 bedside comode;Tub/shower bench  Precautions/Restrictions  Precautions Precautions: Fall Required Braces or Orthoses: Other Brace/Splint Other Brace/Splint: pt normally uses Lt knee brace at  home (does not have here; elastic with 2 velcro straps above and below knee) Restrictions Weight Bearing Restrictions: No  General Chart Reviewed: Yes Family/Caregiver Present: No  Pain Pain Assessment Pain Assessment: No/denies pain Pain Score: 0-No pain  Home Living/Prior Functioning Home Living Available Help at Discharge: Available PRN/intermittently;Other (Comment) [name] unemployed but is in and out) Type of Home: House Home Access: Stairs to enter Technical brewer of Steps: 4 Entrance Stairs-Rails: Right Home Layout: One level  Lives With: Son IADL History Homemaking Responsibilities: Yes Meal Prep Responsibility: Primary Laundry Responsibility: Primary Cleaning Responsibility: Primary Shopping Responsibility: Primary Current License: Yes Mode of Transportation: Car Occupation: On disability Prior Function Level of Independence: Requires assistive device for independence (cane)  Able to Take Stairs?: Yes Driving: Yes Vocation: On disability Leisure: Hobbies-yes (Comment) Comments: likes to play Bingo and cards, shopping - "get out of the house and go visit people", "ride around", "go out to eat"  ADL - See FIM  Vision/Perception  Vision - History Baseline Vision: Wears glasses only for reading Patient Visual Report: No change from baseline Vision - Assessment Eye Alignment: Within Functional Limits Perception Perception: Within Functional Limits Praxis Praxis: Intact   Cognition Overall Cognitive Status: Within Functional Limits for tasks assessed Arousal/Alertness: Awake/alert Comments: Slower processing but appears to have good safety awareness on evaluation  Sensation Sensation Light Touch: Appears Intact (bil. LEs) Proprioception: Appears Intact Additional Comments: BUEs appear intact Coordination Gross Motor Movements are Fluid and Coordinated: Yes Fine Motor Movements are Fluid and Coordinated: Yes  Motor  Motor Motor: Within  Functional Limits  Mobility  Bed Mobility Rolling Left: 5: Supervision Left Sidelying to Sit: 5: Supervision;With rails;HOB flat Transfers Sit to Stand: 4: Min assist;3: Mod assist;From toilet;From bed;From chair/3-in-1 Sit to Stand Details (indicate cue type and reason): More assist needed from lower surfaces, delayed uprighting of trunk. Pt hyperextends bil. LEs to gain stability.  Stand to Sit: With upper extremity assist;To chair/3-in-1;To bed Stand to Sit Details: Cues for UE positioning and to control descent.    Trunk/Postural Assessment  Cervical Assessment Cervical Assessment: Exceptions to [facility] Cervical AROM Overall Cervical AROM Comments: guarded movements from surgery Thoracic Assessment Thoracic Assessment: Exceptions to [facility] Thoracic Strength Overall Thoracic Strength Comments: kyphotic Lumbar Assessment Lumbar Assessment: Exceptions to [facility] Lumbar Strength Overall Lumbar Strength Comments: impaired functional strength/endurance. Pt progressively becomes more flexed through trunk with ambulation and standing activity Postural Control Postural Control: Deficits on evaluation Righting Reactions: Delayed in standing   Balance Balance Balance Assessed: Yes Static Sitting Balance Static Sitting - Balance Support: Feet supported;No upper extremity supported Static Sitting - Level of Assistance: 5: Stand by assistance Static Standing Balance Static Standing - Balance Support: Bilateral upper extremity supported Static Standing - Level of Assistance: 4: Min assist Static Standing - Comment/# of Minutes: Posterior loss of balance Dynamic Standing Balance Dynamic Standing - Balance Support: Bilateral upper extremity supported;Left upper extremity supported Dynamic Standing - Level of Assistance: 3: Mod assist Dynamic Standing - Balance Activities: Reaching for objects;Forward lean/weight shifting;Lateral lean/weight shifting Dynamic Standing - Comments: Again pt has  tendency to fall posteriorly  Extremity/Trunk Assessment RUE Assessment RUE Assessment: Within Functional Limits (can benefit from BUE strengthening exercises) LUE Assessment LUE Assessment: Within Functional Limits (can benefit from BUE strengthening exercises)  FIM:  FIM - Grooming Grooming Steps: Wash, rinse, dry face;Wash, rinse, dry hands;Oral care, brush teeth, clean dentures Grooming: 5: Supervision: safety issues or verbal cues (in standing position at sink) FIM - Bathing Bathing Steps Patient Completed: Chest;Right Arm;Left Arm;Abdomen;Front perineal area;Buttocks;Right upper leg;Left upper leg Bathing: 4: Min-Patient completes 8-9 44f 10 parts or 75+ percent FIM - Upper Body Dressing/Undressing Upper body dressing/undressing steps patient completed: Thread/unthread right bra strap;Thread/unthread left bra strap;Hook/unhook bra;Thread/unthread right sleeve of pullover shirt/dresss;Thread/unthread left sleeve of pullover shirt/dress;Put head through opening of pull over shirt/dress;Pull shirt over trunk Upper body dressing/undressing: 5: Set-up assist to: Obtain clothing/put away FIM - Lower Body Dressing/Undressing Lower body dressing/undressing steps patient completed: Thread/unthread right pants leg;Thread/unthread left pants leg;Pull pants up/down Lower body dressing/undressing: 4: Min-Patient completed 75 plus % of tasks   Refer to Care Plan for Long Term Goals  Recommendations for other services: None at this time  Discharge Criteria: Patient will be discharged from OT if patient refuses treatment 3 consecutive times without medical reason, if treatment goals not met, if there is a change in medical status, if patient makes no progress towards goals or if patient is discharged from hospital.  The above assessment, treatment plan, treatment alternatives and goals were discussed and mutually agreed upon: by  patient  ------------------------------------------------------------------------------------------------------------------------  SESSION NOTE 1035-1130 - 55 Minutes Individual Therapy No complaints of pain Initial 1:1 occupational therapy evaluation completed. Focused skilled intervention on sit<>stands, UB/LB bathing & dressing at sink side, grooming tasks standing at sink, dynamic standing balance/toleance/endurance, and overall activity tolerance/edurance.   Orva Gwaltney 08/23/2013, 11:54 AM

## 2013-08-23 NOTE — Progress Notes (Signed)
Subjective/Complaints: Temperature last night. Bladder full. No cough, dental pain. A 12 point review of systems has been performed and if not noted above is otherwise negative.   Objective: Vital Signs: Blood pressure 112/80, pulse 108, temperature 98.5 F (36.9 C), temperature source Oral, resp. rate 18, height 5\' 2"  (1.575 m), weight 77.565 kg (171 lb), SpO2 100.00%. Dg Chest 2 View  08/22/2013   CLINICAL DATA:  Fever and tachycardia with chest pain and recent back surgery  EXAM: CHEST  2 VIEW  COMPARISON:  08/14/2013  FINDINGS: Heart size and mediastinal contours normal. Vascular pattern normal. Lungs clear. No pleural effusions.  IMPRESSION: No active cardiopulmonary disease.   Electronically Signed   By: Skipper Cliche M.D.   On: 08/22/2013 17:39    Recent Labs  08/23/13 0535  WBC 9.8  HGB 9.6*  HCT 28.5*  PLT 188    Recent Labs  08/23/13 0535  NA 138  K 4.0  CL 102  GLUCOSE 106*  BUN 20  CREATININE 1.34*  CALCIUM 9.2   CBG (last 3)  No results found for this basename: GLUCAP,  in the last 72 hours  Wt Readings from Last 3 Encounters:  08/22/13 77.565 kg (171 lb)  08/19/13 77.4 kg (170 lb 10.2 oz)  08/19/13 77.4 kg (170 lb 10.2 oz)    Physical Exam:  Constitutional: She is oriented to person, place, and time. She appears well-developed and well-nourished.  HENT:  Head: Normocephalic and atraumatic.  Right Ear: External ear normal.  Left Ear: External ear normal.  Eyes: Conjunctivae and EOM are normal. Pupils are equal, round, and reactive to light.  Neck: Normal range of motion. Neck supple. No JVD present. No tracheal deviation present. No thyromegaly present.  Cardiovascular: Normal rate and regular rhythm. No murmur  Pulmonary/Chest: Effort normal and breath sounds normal. No wheezes or rales  Abdominal: Soft. Bowel sounds are normal. She exhibits no distension. There is no tenderness.  Musculoskeletal:  Bilateral thenar atrophy. Left knee and ankle  with minimal edema. Left foot tender.  Lymphadenopathy:  She has no cervical adenopathy.  Neurological: She is alert and oriented to person, place, and time.  Speech clear without dysphonia. Follows commands without difficulty. RUE 3+ prox to 4/5 distally LUE is 3- prox to 4- distally. LE's are 3+/5. HF, Left knee inhibited by pain. Diminished sensation in hands more than feet    Assessment/Plan: 1. Functional deficits secondary to cervical myelopathy, CSF leak with associated deconditioning and weakness which require 3+ hours per day of interdisciplinary therapy in a comprehensive inpatient rehab setting. Physiatrist is providing close team supervision and 24 hour management of active medical problems listed below. Physiatrist and rehab team continue to assess barriers to discharge/monitor patient progress toward functional and medical goals. FIM:                   Comprehension Comprehension Mode: Auditory Comprehension: 5-Follows basic conversation/direction: With no assist  Expression Expression Mode: Verbal Expression: 5-Expresses complex 90% of the time/cues < 10% of the time  Social Interaction Social Interaction: 5-Interacts appropriately 90% of the time - Needs monitoring or encouragement for participation or interaction.  Problem Solving Problem Solving: 5-Solves basic 90% of the time/requires cueing < 10% of the time     Medical Problem List and Plan:  1. DVT Prophylaxis/Anticoagulation: Pharmaceutical: Lovenox  2. Endstage DJD left Knee/Pain Management: Will add voltaren gel to left knee. May need cortisone injection.  3. Mood: No signs of distress. Motivated  to get better and home. Will have LCSW follow for evaluation.  4. Neuropsych: This patient is capable of making decisions on her own behalf.  5. Neurogenic bladder: Check UA/UCS for follow up of treatment. ua neg, culture pending, continue with urecholine and/or Flomax.  6. Dental pain: has been treated  with IV antibiotics. Need to emphasize oral care--will order Peridex mouth wash after meals. Augmentin 5-7 days with close monitoring for diarrhea/ c-diff  -likely cause of temperature--continue to monitor closely.   -wbc's only 9.8 7. Constipation: Component of neurogenic bowel?   miralax bid. Dulcolax suppository prn.   LOS (Days) 1 A FACE TO FACE EVALUATION WAS PERFORMED  Risa Auman T 08/23/2013 7:36 AM

## 2013-08-23 NOTE — Progress Notes (Addendum)
Social Work Assessment and Plan  Patient Details  Name: Colleen Nunez MRN: [id number] Date of Birth: 1956-04-10  Today's Date: 08/23/2013  Problem List:  Patient Active Problem List   Diagnosis Date Noted  . Hypokalemia 08/16/2013  . AKI (acute kidney injury) 08/16/2013  . UTI (lower urinary tract infection) 08/07/2013  . Nausea & vomiting 08/07/2013  . Chronic kidney disease, stage IV (severe) 08/07/2013  . Cervical myelopathy 08/07/2013  . GERD 06/12/2009  . CHEST PAIN 06/12/2009  . LEG PAIN, BILATERAL 05/28/2009  . OBESITY 12/17/2007  . DYSPHAGIA UNSPECIFIED 12/03/2007  . ANEMIA, NORMOCYTIC 07/13/2007  . ALCOHOL ABUSE 07/13/2007  . RENAL DISEASE, CHRONIC, STAGE II 07/13/2007  . ANXIETY 06/29/2007  . DEPRESSION 06/29/2007  . HYPERTENSION 06/29/2007  . CONSTIPATION 06/29/2007  . ARTHRITIS 06/29/2007  . MALAISE AND FATIGUE 06/29/2007   Past Medical History:  Past Medical History  Diagnosis Date  . Hypertension   . Arthritis   . Hepatitis C antibody test positive        . Renal insufficiency   . Anxiety   . Depression     h/o suicide attempts in the past  . GERD (gastroesophageal reflux disease)   . Polysubstance abuse     h/o  . Anemia   . Gout    Past Surgical History:  Past Surgical History  Procedure Laterality Date  . Knee arthroscopy    . Abdominal hysterectomy    . Cartilage removal      left knee  . Anterior cervical corpectomy N/A 06/09/2013    Procedure: C4 C5 Corpectomy with C6-7 Anterior cervical fusion/Peek cage/Trestle plate;  Surgeon: Otilio Connors, MD;  Location: [place] [facility];  Service: Neurosurgery;  Laterality: N/A;  Cervical Four, Cervical Five Corpectomy and Cervical six-seven Anterior cervical fusion/Peek cage Three-Five /Trestle Plating Cervical Three to Cervical Seven  . X-stop implantation    . Bilateral soo and appendectomy  2006  . Anterior cervical decomp/discectomy fusion N/A 08/12/2013    Procedure: Repair of Anterior  Cervical  CSF LEAK, lumbar drain placement.;  Surgeon: Floyce Stakes, MD;  Location: [place];  Service: Neurosurgery;  Laterality: N/A;   Social History:  reports that she has never smoked. She does not have any smokeless tobacco history on file. She reports that she drinks about 1.8 ounces of alcohol per week. She reports that she does not use illicit drugs.  Family / Support Systems Marital Status: Single Patient Roles: Parent Children: [name] - older son lives in pt's home  Elberta Fortis (and wife, Nira Conn) - younger son  [phone](C) Other Supports: Marice Potter (payee and friend) and other friends Anticipated Caregiver: children prn only Ability/Limitations of Caregiver: [name], son unemployed but in and out; Heather and Elberta Fortis work Building control surveyor Availability: Intermittent Family Dynamics: Pt would like to be in her home by herself again and is working toward helping son have money to fix his car and insure it so he can move on and get a job.  Social History Preferred language: English Religion: Baptist Education: completed 10th grade Read: Yes Write: Yes Employment Status: Disabled Date Retired/Disabled/Unemployed: 2002 Public relations account executive Issues: in the past, nothing current   Abuse/Neglect Physical Abuse: Denies Verbal Abuse: Denies Sexual Abuse: Denies Exploitation of patient/patient's resources: Denies Self-Neglect: Denies  Emotional Status Pt's affect, behavior adn adjustment status: Pt reports feeling sad and crying at home when she doesn't feel well, but has not felt that way since being in the hospital and rehab.  She feels motivated  and wants to get better and stronger and be independent at home. Recent Psychosocial Issues: Pt reports that having her older son temporarily living with her for the last 3 months is "stressful."  She and her younger son are trying to save money so that they can help the older son fix and insure his car so that he can get a job and move  out of pt's home. Pyschiatric History: Pt reported to Admissions Coordintor a hx of Schizophrenia, but chose not to disclose this to CSW at assessment, stating that she'd rather not say why she was deemed disabled.  Pt did share with CSW a hx of depression in the past, but does not want any support, resources, or medication for it.  She relies on her strength and faith in God.   Substance Abuse History: Pt disclosed that she had substance abuse in the past, but does not currently abuse alcohol/drugs/prescription medications.  She again relies on prayer and her faith daily to keep herself from using these things to get through life's struggles.    Patient / Family Perceptions, Expectations & Goals Pt/Family understanding of illness & functional limitations: Pt has a good understanding of her condition. Premorbid pt/family roles/activities: Pt likes to play bingo, spades, stay up late, ride around with her friends, and talk openly with close friends.  Pt states that when she is feeling good she cleans her house. Anticipated changes in roles/activities/participation: Pt feels she can return to all of the above and that is her goal/expectation. Pt/family expectations/goals: Pt wants "to be able to return home as independent as I can."  [employer]: Other (Comment) (DSS (food stamps, Medicaid transportation, PCS)) Premorbid Home Care/DME Agencies: Other (Comment) (Pt has a rolling walker, cane, and 3-in-1 (agency unknown)  ) Transportation available at discharge: youngest son and Risk analyst referrals recommended: Neuropsychology  Discharge Planning Living Arrangements: Children (Pt's son, [name], living temporarily with her last 3 months) Insurance Resources: Medicaid (specify county) Lawyer) Financial Resources: [employer] Financial Screen Referred: No Living Expenses: Education officer, community Management: Other (Comment) (Annette Pass - friend/payee) Does the  patient have any problems obtaining your medications?: No Home Management: Pt is responsible with some assistance from her family Patient/Family Preliminary Plans: Pt plans to return home to her home with home care assistance from [facility] and therapies, if needed. Barriers to Discharge: Steps Social Work Anticipated Follow Up Needs: HH/OP Expected length of stay: ELOS 1 to 2 weeks  Clinical Impression CSW met with pt who is very motivated to get stronger and better and be independent at home.  She wants to return to the activities she enjoys and wants to get her son to move on and out of her home.  Pt admits that her emotional state is better when her physical state is better, so she relies on her faith and inner strength to help her through various struggles with her physical condition.  Pt is close with her younger son and she can count on him to help her.  Her older son has been helpful at times (getting her meals, helping with baths, getting her in the [facility] for medical appts), but he yells at pt and moves her things in her home and breaks things and pt feels his help is not worth the stress he brings.  Pt has a friend who pays her bills so that her son will not have access to her money.  Pt trusts this friend, but just told her yesterday that  she's been in the hospital because the friend has a lot going on with her own family.  Pt was to have PCS (personal care services) started just prior to being admitted to the hospital.  They are to begin when pt is d/c'd.  Pt is to also receive an RN, but cannot remember the name of the agency to provide the services.  CSW will continue to follow and assist pt with any d/c needs.  Pt stated she will give CSW information to son to call with any questions, otherwise, she does not see the need for CSW to contact sons at this time.  Matisha Termine, Silvestre Mesi 08/23/2013, 11:33 AM

## 2013-08-24 ENCOUNTER — Inpatient Hospital Stay (HOSPITAL_COMMUNITY): Payer: Medicaid Other | Admitting: Physical Therapy

## 2013-08-24 ENCOUNTER — Inpatient Hospital Stay (HOSPITAL_COMMUNITY): Payer: Medicaid Other

## 2013-08-24 NOTE — Progress Notes (Signed)
Note reviewed and accurately reflects treatment session.   

## 2013-08-24 NOTE — Progress Notes (Signed)
Physical Therapy Note  Patient Details  Name: Colleen Nunez MRN: [id number] Date of Birth: [birth year] Today's Date: 08/24/2013  [id number] (55 minutes) individual Pain: No pain reported  Other: BP (sitting) 118/64 pulse 106-107 Oxygen sats 100% RA Focus of treatment: Bilateral LE strengthening in supine/standing; gait training Treatment: Pt in bed upon arrival; agreeable to PT; supine to sit with HOB elevated SBA with increased time; transfer stand/turn RW min assist; sit to supine (mat) SBA; therapeutic exercise (supine) X 20 - ankle pumps, heel slides, hip abduction, SAQs; sit to stand min to SBA using bilateral UEs; stand to sit min assist secondary to decreased bilateral eccentric knee control; standing (static ) x 1 minute close SBA with min sway; gait 15 feet RW min assist with recurvatum LT knee (pain 3/10) ; wc mobility - 120 feet SBA; pt left up in wc with call bell within reach.   1445, 1530 pt missed 75 minutes of PT sessions secondary to c/o chills. Two attempts were made to see patient (see times above).    Merilyn Pagan,JIM 08/24/2013, 9:06 AM

## 2013-08-24 NOTE — Progress Notes (Signed)
Subjective/Complaints: Complains of having sweats last night. Thinks it's from abx A 12 point review of systems has been performed and if not noted above is otherwise negative.   Objective: Vital Signs: Blood pressure 106/70, pulse 109, temperature 98.3 F (36.8 C), temperature source Oral, resp. rate 16, height 5\' 2"  (1.575 m), weight 77.747 kg (171 lb 6.4 oz), SpO2 98.00%. Dg Chest 2 View  08/22/2013   CLINICAL DATA:  Fever and tachycardia with chest pain and recent back surgery  EXAM: CHEST  2 VIEW  COMPARISON:  08/14/2013  FINDINGS: Heart size and mediastinal contours normal. Vascular pattern normal. Lungs clear. No pleural effusions.  IMPRESSION: No active cardiopulmonary disease.   Electronically Signed   By: Skipper Cliche M.D.   On: 08/22/2013 17:39    Recent Labs  08/23/13 0535  WBC 9.8  HGB 9.6*  HCT 28.5*  PLT 188    Recent Labs  08/23/13 0535  NA 138  K 4.0  CL 102  GLUCOSE 106*  BUN 20  CREATININE 1.34*  CALCIUM 9.2   CBG (last 3)  No results found for this basename: GLUCAP,  in the last 72 hours  Wt Readings from Last 3 Encounters:  08/24/13 77.747 kg (171 lb 6.4 oz)  08/19/13 77.4 kg (170 lb 10.2 oz)  08/19/13 77.4 kg (170 lb 10.2 oz)    Physical Exam:  Constitutional: She is oriented to person, place, and time. She appears well-developed and well-nourished.  HENT: poor dentition, missing teeth Head: Normocephalic and atraumatic.  Right Ear: External ear normal.  Left Ear: External ear normal.  Eyes: Conjunctivae and EOM are normal. Pupils are equal, round, and reactive to light.  Neck: Normal range of motion. Neck supple. No JVD present. No tracheal deviation present. No thyromegaly present.  Cardiovascular: Normal rate and regular rhythm. No murmur  Pulmonary/Chest: Effort normal and breath sounds normal. No wheezes or rales  Abdominal: Soft. Bowel sounds are normal. She exhibits no distension. There is no tenderness.  Musculoskeletal:   Bilateral thenar atrophy. Left knee and ankle with minimal edema. Left foot tender.  Lymphadenopathy:  She has no cervical adenopathy.  Neurological: She is alert and oriented to person, place, and time.  Speech clear without dysphonia. Follows commands without difficulty. RUE 3+ prox to 4/5 distally LUE is 3- prox to 4 distally. LE's are 3+ to4/5. HF, Left knee inhibited by pain. Diminished sensation in hands more than feet    Assessment/Plan: 1. Functional deficits secondary to cervical myelopathy, CSF leak with associated deconditioning and weakness which require 3+ hours per day of interdisciplinary therapy in a comprehensive inpatient rehab setting. Physiatrist is providing close team supervision and 24 hour management of active medical problems listed below. Physiatrist and rehab team continue to assess barriers to discharge/monitor patient progress toward functional and medical goals. FIM: FIM - Bathing Bathing Steps Patient Completed: Chest;Right Arm;Left Arm;Abdomen;Front perineal area;Buttocks;Right upper leg;Left upper leg Bathing: 4: Min-Patient completes 8-9 78f 10 parts or 75+ percent  FIM - Upper Body Dressing/Undressing Upper body dressing/undressing steps patient completed: Thread/unthread right bra strap;Thread/unthread left bra strap;Hook/unhook bra;Thread/unthread right sleeve of pullover shirt/dresss;Thread/unthread left sleeve of pullover shirt/dress;Put head through opening of pull over shirt/dress;Pull shirt over trunk Upper body dressing/undressing: 5: Set-up assist to: Obtain clothing/put away FIM - Lower Body Dressing/Undressing Lower body dressing/undressing steps patient completed: Thread/unthread right pants leg;Thread/unthread left pants leg;Pull pants up/down Lower body dressing/undressing: 4: Min-Patient completed 75 plus % of tasks  FIM - Locomotion: Wheelchair Distance: 45' FIM - Locomotion: Ambulation Ambulation/Gait Assistance: 3: Mod  assist  Comprehension Comprehension Mode: Auditory Comprehension: 5-Follows basic conversation/direction: With no assist  Expression Expression Mode: Verbal Expression: 5-Expresses complex 90% of the time/cues < 10% of the time  Social Interaction Social Interaction: 5-Interacts appropriately 90% of the time - Needs monitoring or encouragement for participation or interaction.  Problem Solving Problem Solving: 5-Solves basic 90% of the time/requires cueing < 10% of the time  Memory Memory: 5-Requires cues to use assistive device  Medical Problem List and Plan:  1. DVT Prophylaxis/Anticoagulation: Pharmaceutical: Lovenox  2. Endstage DJD left Knee/Pain Management: voltaren gel  3. Mood: No signs of distress. Motivated to get better and home. Will have LCSW follow for evaluation.  4. Neuropsych: This patient is capable of making decisions on her own behalf.  5. Neurogenic bladder:  ua neg, culture pending, continue with urecholine and/or Flomax.  6. Dental pain: has been treated with IV antibiotics. Need to emphasize oral care--Peridex mouth wash after meals. Augmentin continue for a total of 5-7 days   -now afebrile   -wbc's only 9.8 7. Constipation: Component of neurogenic bowel?   miralax bid. Had formed bm's on monday   LOS (Days) 2 A FACE TO FACE EVALUATION WAS PERFORMED  SWARTZ,ZACHARY T 08/24/2013 7:49 AM

## 2013-08-24 NOTE — Progress Notes (Signed)
Occupational Therapy Session Note  Patient Details  Name: Colleen Nunez MRN: [id number] Date of Birth: Jan 10, 1956  Today's Date: 08/24/2013 Time: 1100-1200 Time Calculation (min): 60 min  Short Term Goals: Week 1:  OT Short Term Goal 1 (Week 1): Short Term Goals = Long Term Goals secondary to ELOS      Skilled Therapeutic Interventions/Progress Updates:    pt sitting in w/c upon arrival. Pt transferred to and from walker to shower chair with Min A. Pt required Mod A for bathing, requiring assistanc with buttocks, lower legs and feet (pt stood from shower chair to assist with washing buttocks.) Pt engaged in dressing tasks while sitting in w/c. Pt able to thread r and l arm in bra, required assistance to hook bra. Pt donned pull over shirt, requiring Min A for upper body dressing. Pt required Max A for lower body dressing. Pt able to thread r and l legs required assistance to pull over hips, and to donn socks. Pt engaged in grooming tasks while sitting in w/c in front of sink. Pt completes 3/4 steps. Focus of session self care, transfers, activity tolerance. Left pt sitting in w/c with phone and call bell in reach.   Therapy Documentation Precautions:  Precautions Precautions: Fall Required Braces or Orthoses: Other Brace/Splint Other Brace/Splint: pt normally uses Lt knee brace at home (does not have here; elastic with 2 velcro straps above and below knee) Restrictions Weight Bearing Restrictions: No    Pain: Pain Assessment Pain Assessment: No/denies pain:    See FIM for current functional status  Therapy/Group: Individual Therapy  Lajuana Ripple 08/24/2013, 12:07 PM

## 2013-08-24 NOTE — Progress Notes (Signed)
Chaplain followed up from previous on call chaplain the night before.  Upon arriving, chaplain met with pt and began a conversation about her family, life, and her physical therapy here at [facility].  Soon thereafter, we shared a conversation about her faith and what she believes.  It seemed important to the pt to stress that the "Reita Cliche is with me."  Pt then elaborated on what that meant, citing many of the challenges she has faced over the years.  Chaplain provided empathetic listening, a ministry of presence, and pastoral support.  A follow up in the next few days depending on pt's discharge would be ideal.   08/24/13 1030  Clinical Encounter Type  Visited With Patient  Visit Type Spiritual support;Follow-up  Referral From Chaplain  Consult/Referral To Chaplain  Spiritual Encounters  Spiritual Needs Emotional  Stress Factors  Patient Stress Factors Financial concerns;Family relationships;Major life changes  Family Stress Factors None identified  [place]   [phone]

## 2013-08-25 ENCOUNTER — Inpatient Hospital Stay (HOSPITAL_COMMUNITY): Payer: Medicaid Other | Admitting: Physical Therapy

## 2013-08-25 ENCOUNTER — Inpatient Hospital Stay (HOSPITAL_COMMUNITY): Payer: Medicaid Other

## 2013-08-25 MED ORDER — PERMETHRIN 0.25 % LIQD
Freq: Once | Status: DC
  Filled 2013-08-25 (×2): qty 147.86

## 2013-08-25 MED ORDER — PERMETHRIN 1 % EX LOTN
TOPICAL_LOTION | Freq: Once | CUTANEOUS | Status: DC
  Filled 2013-08-25: qty 59

## 2013-08-25 MED ORDER — PYRETHRINS-PIPERONYL BUTOXIDE 0.33-4 % EX SHAM
MEDICATED_SHAMPOO | Freq: Once | CUTANEOUS | Status: AC
  Administered 2013-08-25: 16:00:00 via TOPICAL
  Filled 2013-08-25: qty 1

## 2013-08-25 NOTE — Progress Notes (Signed)
Occupational Therapy Session Note  Patient Details  Name: Colleen Nunez MRN: [id number] Date of Birth: [birth year]  Today's Date: 08/25/2013 Time: 1345-1430 Time Calculation (min): 45 min  Short Term Goals: Week 1:  OT Short Term Goal 1 (Week 1): Short Term Goals = Long Term Goals secondary to ELOS  Skilled Therapeutic Interventions/Progress Updates:    pt sitting in w/c upon arrival. Therapist wheeled pt to rehab gym. Pt stood from w/c with RW. Pt engaged in therapeutic activity of ambulated with RW to pick up small plastic rings off floor using reacher, which was located all around rehab gym. Pt required close supervision while walking. Pt engaged in game of connect 4 while standing with table, chips, and game board place at different angles to encourage reaching, therapist moved table futher away, then to right and left side as well as chips, and game board, for each round. Pt able to stand for at least 5 minutes. Focus of session dynamic standing balance, activity tolerance, trunk control strengthening. Left pt sitting in w/c with call bell and phone in reach.   Therapy Documentation Precautions:  Precautions Precautions: Fall Required Braces or Orthoses: Other Brace/Splint Other Brace/Splint: pt normally uses Lt knee brace at home (does not have here; elastic with 2 velcro straps above and below knee) Restrictions Weight Bearing Restrictions: No    Pain: Pain Assessment Pain Assessment: No/denies pain  See FIM for current functional status  Therapy/Group: Individual Therapy  Lajuana Ripple 08/25/2013, 2:54 PM

## 2013-08-25 NOTE — Progress Notes (Signed)
Note reviewed and accurately reflects treatment session.   

## 2013-08-25 NOTE — Progress Notes (Signed)
Physical Therapy Session Note  Patient Details  Name: Colleen Nunez MRN: [id number] Date of Birth: Mar 18, 1956  Today's Date: 08/25/2013 Time: 1430-1500 Time Calculation (min): 30 min  Short Term Goals: Week 1:  PT Short Term Goal 1 (Week 1): = long term goals   Skilled Therapeutic Interventions/Progress Updates:    Pt likes Lt. Knee brace a lot, no pain reported and no buckling noted today. Ambulation x 75', 90' with RW and S/min-guard assist cues for upright posture. Transfer training wheelchair <> mat with RW x 2 reps mod verbal cues for wheelchair set-up and supervision level for physical assist.   Therapy Documentation Precautions:  Precautions Precautions: Fall Required Braces or Orthoses: Other Brace/Splint Other Brace/Splint: pt normally uses Lt knee brace at home (does not have here; elastic with 2 velcro straps above and below knee) Restrictions Weight Bearing Restrictions: No Pain: Pain Assessment Pain Assessment: No/denies pain  See FIM for current functional status  Therapy/Group: Individual Therapy  Lahoma Rocker 08/25/2013, 3:06 PM

## 2013-08-25 NOTE — Progress Notes (Signed)
Social Work Patient ID: Colleen Nunez, female   DOB: May 10, 1956, 57 y.o.   MRN: [id number]  CSW met with pt 08-24-13 to update her on team conference discussion, including telling her about her targeted d/c date of 09-02-13.  Pt was disappointed that she has to stay that long, but CSW explained that she needs that time so that she can be more independent when she returns home.  Pt expressed understanding of that.  Goals were set for mod I for transfer goals and w/c level of care.  Pt will need supervision with ambulation and stairs.  Pt does not have consistent caregiver support, but has sons who will assist her.  CSW to continue to follow pt and assist with d/c plan as needed.

## 2013-08-25 NOTE — Progress Notes (Signed)
Orthopedic Tech Progress Note Patient Details:  Colleen Nunez 05-24-1956 [id number]  Patient ID: [name], female   DOB: 1956/06/17, 57 y.o.   MRN: [id number]   Irish Elders 08/25/2013, 2:32 PMCalled advanced for left hyperextension brace.

## 2013-08-25 NOTE — Progress Notes (Signed)
Patient LBM 08/23/13 refused to take dulcolax suppository.

## 2013-08-25 NOTE — IPOC Note (Signed)
Overall Plan of Care [facility]) Patient Details Name: Colleen Nunez MRN: [id number] DOB: Apr 10, 1956  Admitting Diagnosis: acdf,cervical stenosis  Hospital Problems: Principal Problem:   Cervical myelopathy Active Problems:   DEPRESSION   HYPERTENSION   CONSTIPATION   RENAL DISEASE, CHRONIC, STAGE II     Functional Problem List: Nursing Bladder;Bowel;Endurance;Medication Management;Nutrition;Pain;Perception;Sensory;Safety;Skin Integrity  PT Balance;Endurance  OT Balance;Endurance;Motor;Pain;Safety;Skin Integrity  SLP    TR         Basic ADL's: OT Grooming;Bathing;Dressing;Toileting     Advanced  ADL's: OT Simple Meal Preparation;Light Housekeeping     Transfers: PT Bed Mobility;Bed to Chair;Car;Furniture  OT Toilet;Tub/Shower     Locomotion: PT Ambulation;Wheelchair Mobility;Stairs     Additional Impairments: OT None (patient can benefit from BUE strengthening exercises)  SLP        TR      Anticipated Outcomes Item Anticipated Outcome  Self Feeding Independent  Swallowing      Basic self-care  Mod I  Toileting  Mod I   Bathroom Transfers Mod I  Bowel/Bladder  continent of bowel and bladder with toileting modified independence  Transfers  Modified independent   Locomotion  Supervision  Communication     Cognition     Pain  3 or less on scalle of 1-10  Safety/Judgment  supervision   Therapy Plan: PT Intensity: Minimum of 1-2 x/day ,45 to 90 minutes PT Frequency: 5 out of 7 days PT Duration Estimated Length of Stay: 10-12 days OT Intensity: Minimum of 1-2 x/day, 45 to 90 minutes OT Frequency: 5 out of 7 days OT Duration/Estimated Length of Stay: 7-10 days         Team Interventions: Nursing Interventions Patient/Family Education;Bladder Management;Bowel Management;Disease Management/Prevention;Pain Management;Medication Management;Dysphagia/Aspiration Precaution Training;Discharge Planning;Psychosocial Support  PT interventions  Ambulation/gait training;Balance/vestibular training;Cognitive remediation/compensation;Discharge planning;Community reintegration;DME/adaptive equipment instruction;Neuromuscular re-education;Patient/family education;Therapeutic Exercise;UE/LE Coordination activities;Therapeutic Activities;Psychosocial support;Stair training;UE/LE Strength taining/ROM;Wheelchair propulsion/positioning  OT Interventions Balance/vestibular training;Community reintegration;Discharge planning;DME/adaptive equipment instruction;Functional mobility training;Pain management;Psychosocial support;Patient/family education;Self Care/advanced ADL retraining;Skin care/wound managment;Splinting/orthotics;Therapeutic Activities;Therapeutic Exercise;UE/LE Strength taining/ROM;UE/LE Coordination activities;Wheelchair propulsion/positioning  SLP Interventions    TR Interventions    SW/CM Interventions Discharge Planning;Psychosocial Support;Patient/Family Education    Team Discharge Planning: Destination: PT-Home ,OT- Home , SLP-Home Projected Follow-up: PT-Home health PT (supervision for steps and ambulation), OT-  Other (comment) (additional OT is TBD), SLP-  Projected Equipment Needs: PT- , OT- 3 in 1 bedside comode;Tub/shower bench, SLP-None recommended by SLP Patient/family involved in discharge planning: PT- Patient,  OT-Patient, SLP-Patient  MD ELOS: 10 days Medical Rehab Prognosis:  Excellent Assessment: The patient has been admitted for CIR therapies. The team will be addressing, functional mobility, strength, stamina, balance, safety, adaptive techniques/equipment, self-care, bowel and bladder mgt, patient and caregiver education, pain mgt,NMR, . Goals have been set at Duane Lope, MD, [facility]      See Team Conference Notes for weekly updates to the plan of care

## 2013-08-25 NOTE — IPOC Note (Signed)
Overall Plan of Care [facility]) Patient Details Name: Colleen Nunez MRN: [id number] DOB: 09/07/1956  Admitting Diagnosis: acdf,cervical stenosis  Hospital Problems: Principal Problem:   Cervical myelopathy Active Problems:   DEPRESSION   HYPERTENSION   CONSTIPATION   RENAL DISEASE, CHRONIC, STAGE II     Functional Problem List: Nursing Bladder;Bowel;Endurance;Medication Management;Nutrition;Pain;Perception;Sensory;Safety;Skin Integrity  PT Balance;Endurance  OT Balance;Endurance;Motor;Pain;Safety;Skin Integrity  SLP    TR         Basic ADL's: OT Grooming;Bathing;Dressing;Toileting     Advanced  ADL's: OT Simple Meal Preparation;Light Housekeeping     Transfers: PT Bed Mobility;Bed to Chair;Car;Furniture  OT Toilet;Tub/Shower     Locomotion: PT Ambulation;Wheelchair Mobility;Stairs     Additional Impairments: OT None (patient can benefit from BUE strengthening exercises)  SLP        TR      Anticipated Outcomes Item Anticipated Outcome  Self Feeding Independent  Swallowing      Basic self-care  Mod I  Toileting  Mod I   Bathroom Transfers Mod I  Bowel/Bladder  continent of bowel and bladder with toileting modified independence  Transfers  Modified independent   Locomotion  Supervision  Communication     Cognition     Pain  3 or less on scalle of 1-10  Safety/Judgment  supervision   Therapy Plan: PT Intensity: Minimum of 1-2 x/day ,45 to 90 minutes PT Frequency: 5 out of 7 days PT Duration Estimated Length of Stay: 10-12 days OT Intensity: Minimum of 1-2 x/day, 45 to 90 minutes OT Frequency: 5 out of 7 days OT Duration/Estimated Length of Stay: 7-10 days         Team Interventions: Nursing Interventions Patient/Family Education;Bladder Management;Bowel Management;Disease Management/Prevention;Pain Management;Medication Management;Dysphagia/Aspiration Precaution Training;Discharge Planning;Psychosocial Support  PT interventions  Ambulation/gait training;Balance/vestibular training;Cognitive remediation/compensation;Discharge planning;Community reintegration;DME/adaptive equipment instruction;Neuromuscular re-education;Patient/family education;Therapeutic Exercise;UE/LE Coordination activities;Therapeutic Activities;Psychosocial support;Stair training;UE/LE Strength taining/ROM;Wheelchair propulsion/positioning  OT Interventions Balance/vestibular training;Community reintegration;Discharge planning;DME/adaptive equipment instruction;Functional mobility training;Pain management;Psychosocial support;Patient/family education;Self Care/advanced ADL retraining;Skin care/wound managment;Splinting/orthotics;Therapeutic Activities;Therapeutic Exercise;UE/LE Strength taining/ROM;UE/LE Coordination activities;Wheelchair propulsion/positioning  SLP Interventions    TR Interventions    SW/CM Interventions Discharge Planning;Psychosocial Support;Patient/Family Education    Team Discharge Planning: Destination: PT-Home ,OT- Home , SLP-Home Projected Follow-up: PT-Home health PT (supervision for steps and ambulation), OT-  Other (comment) (additional OT is TBD), SLP-  Projected Equipment Needs: PT- , OT- 3 in 1 bedside comode;Tub/shower bench, SLP-None recommended by SLP Patient/family involved in discharge planning: PT- Patient,  OT-Patient, SLP-Patient  MD ELOS: 10-12 days Medical Rehab Prognosis:  Good Assessment: 57 y.o. female with history of ACDF for stenosis with myelopathy 05/2013 and continued BLE weakness as well as recent gout flare up with left knee and ankle pain. Admitted on 08/07/13 to [facility] with N/V and difficulty swallowing. CT neck with with very large prevertebral but trans-spatial fluid collection encompassing 8.6 x 7.3 x 11.6 cm c/w a postoperative CSF leak and patient transferred to [facility] on 08/12/13. She was taken to OR the same day for evacuation of fluid collection and Insertion of spinal drai by Dr. Joya Salm. Swallow  evaluation done showed no signs of dysphagia and regular textures, thin liquids recommended. Kleb Pneumo pyelonephritis treated with 10 day course of rocephin. Patient has had woresening of renal status and renal u/s with evidence of medical renal disease on right and unable to ID left kidney  Now requiring 24/7 Rehab RN,MD, as well as CIR level PT, OT .  Treatment team will focus on ADLs and mobility with goals set  at [facility] I/Sup     See Team Conference Notes for weekly updates to the plan of care

## 2013-08-25 NOTE — Progress Notes (Signed)
Physical Therapy Session Note  Patient Details  Name: Colleen Nunez MRN: [id number] Date of Birth: Sep 25, 1956  Today's Date: 08/25/2013 Time: 0730-0830 Time Calculation (min): 60 min  Short Term Goals: Week 1:  PT Short Term Goal 1 (Week 1): = long term goals   Skilled Therapeutic Interventions/Progress Updates:   Bed mobility and stand pivot transfers min-guard/supervision with min verbal cues. Wheelchair parts management with min assist, pt needs min assist for tight space negotiation and to place and remove leg rests.  Ambulation trial of Lt. Knee brace to block hyperextension, pt reporting significant decrease in Lt. Knee pain, demonstrated improved stability with ambulation and able to ambulate further with RW (50-60'). Obstacle course involving navigating obstacles (around and over) and stairs with Rt. Rail only. Overall pt close supervision/min assist. Min cues for safety.   Discussed need for supervision with gait at home, need to utilize RW. Pt states "I'm going to use my cane like I always do," pt educated on need for RW due to increased weakness and risks/consequences of falls. Pt verbalized understanding but will likely need re-education. Discussed use of wheelchair in house when supervision not available, pt agreeable.   Therapy Documentation Precautions:  Precautions Precautions: Fall Required Braces or Orthoses: Other Brace/Splint Other Brace/Splint: pt normally uses Lt knee brace at home (does not have here; elastic with 2 velcro straps above and below knee) Restrictions Weight Bearing Restrictions: No Pain:  no c/o  See FIM for current functional status  Therapy/Group: Individual Therapy  Lahoma Rocker 08/25/2013, 12:24 PM

## 2013-08-25 NOTE — Patient Care Conference (Signed)
Inpatient RehabilitationTeam Conference and Plan of Care Update Date: 08/23/2013   Time: 2:40 PM    Patient Name: Colleen Nunez      Medical Record Number: [id number]  Date of Birth: 1956-07-26 Sex: Female         Room/Bed: 4M01C/4M01C-01 Payor Info: Payor: MEDICAID [place] / Plan: MEDICAID [place] ACCESS / Product Type: *No Product type* /    Admitting Diagnosis: acdf,cervical stenosis  Admit Date/Time:  08/22/2013  3:44 PM Admission Comments: No comment available   Primary Diagnosis:  Cervical myelopathy Principal Problem: Cervical myelopathy  Patient Active Problem List   Diagnosis Date Noted  . Hypokalemia 08/16/2013  . AKI (acute kidney injury) 08/16/2013  . UTI (lower urinary tract infection) 08/07/2013  . Nausea & vomiting 08/07/2013  . Chronic kidney disease, stage IV (severe) 08/07/2013  . Cervical myelopathy 08/07/2013  . GERD 06/12/2009  . CHEST PAIN 06/12/2009  . LEG PAIN, BILATERAL 05/28/2009  . OBESITY 12/17/2007  . DYSPHAGIA UNSPECIFIED 12/03/2007  . ANEMIA, NORMOCYTIC 07/13/2007  . ALCOHOL ABUSE 07/13/2007  . RENAL DISEASE, CHRONIC, STAGE II 07/13/2007  . ANXIETY 06/29/2007  . DEPRESSION 06/29/2007  . HYPERTENSION 06/29/2007  . CONSTIPATION 06/29/2007  . ARTHRITIS 06/29/2007  . MALAISE AND FATIGUE 06/29/2007    Expected Discharge Date: Expected Discharge Date: 09/02/13  Team Members Present: Social Worker Present: Lennart Pall, LCSW Nurse Present: Elliot Cousin, RN PT Present: Canary Brim, Rayburn Ma, PT OT Present: Chrys Racer, OT SLP Present: Weston Anna, SLP PPS Coordinator present : Daiva Nakayama, RN, CRRN     Current Status/Progress Goal Weekly Team Focus  Medical   cervical myelopathy, with central cord, radic. deconditionned after csf leak, fluid collection  maximize activity tolerance  pain control, fever assessment and treat   Bowel/Bladder   continent Bowel and bladder with toileting modified independence, miralax daily, flomax  and urecholine as ordered, checking PVR  continent bowel and bladder modified independence      Swallow/Nutrition/ Hydration   appears [facility] with regular textures, thin liquids         ADL's   overall min assist  overall modified indepenent  ADL retraining, sit<>stands, functional mobility, functional transfers, overall activity tolerance/endurance   Mobility   mod assist  modified independent transfers/supervision gait and stairs  Endurance, balance, ambulation, stairs, transfers, generalized strengthening.    Communication   appears within gross functional limits         Safety/Cognition/ Behavioral Observations  appears to be at baseline  no falls with injury      Pain   no c/o of pain  3 or lesss on scale 1-10      Skin   intact  no skin breakdown       Rehab Goals Patient on target to meet rehab goals: Yes Rehab Goals Revised: Pt is being evaluated today. *See Care Plan and progress notes for long and short-term goals.  Barriers to Discharge: permorbid deficits,     Possible Resolutions to Barriers:  assistance at home, adaptive equpiment    Discharge Planning/Teaching Needs:  Pt plans to return home with support from [place] and intermittent help from her sons.      Team Discussion:  Pt has Mod I transfer goals and will be a w/c level of care.  She will need supervision with stairs and ambulation.    Revisions to Treatment Plan:  None   Continued Need for Acute Rehabilitation Level of Care: The patient requires daily medical management by a  physician with specialized training in physical medicine and rehabilitation for the following conditions: Daily direction of a multidisciplinary physical rehabilitation program to ensure safe treatment while eliciting the highest outcome that is of practical value to the patient.: Yes Daily medical management of patient stability for increased activity during participation in an intensive rehabilitation regime.:  Yes Daily analysis of laboratory values and/or radiology reports with any subsequent need for medication adjustment of medical intervention for : Post surgical problems;Neurological problems  Geonna Lockyer, Silvestre Mesi 08/25/2013, 9:52 AM

## 2013-08-25 NOTE — Progress Notes (Signed)
Subjective/Complaints: Up with therapy already today. No fever. Slept a little better.  A 12 point review of systems has been performed and if not noted above is otherwise negative.   Objective: Vital Signs: Blood pressure 106/63, pulse 101, temperature 98.8 F (37.1 C), temperature source Oral, resp. rate 18, height 5\' 2"  (1.575 m), weight 79.878 kg (176 lb 1.6 oz), SpO2 97.00%. No results found.  Recent Labs  08/23/13 0535  WBC 9.8  HGB 9.6*  HCT 28.5*  PLT 188    Recent Labs  08/23/13 0535  NA 138  K 4.0  CL 102  GLUCOSE 106*  BUN 20  CREATININE 1.34*  CALCIUM 9.2   CBG (last 3)  No results found for this basename: GLUCAP,  in the last 72 hours  Wt Readings from Last 3 Encounters:  08/24/13 79.878 kg (176 lb 1.6 oz)  08/19/13 77.4 kg (170 lb 10.2 oz)  08/19/13 77.4 kg (170 lb 10.2 oz)    Physical Exam:  Constitutional: She is oriented to person, place, and time. She appears well-developed and well-nourished.  HENT: poor dentition, missing teeth Head: Normocephalic and atraumatic.  Right Ear: External ear normal.  Left Ear: External ear normal.  Eyes: Conjunctivae and EOM are normal. Pupils are equal, round, and reactive to light.  Neck: Normal range of motion. Neck supple. No JVD present. No tracheal deviation present. No thyromegaly present.  Cardiovascular: Normal rate and regular rhythm. No murmur  Pulmonary/Chest: Effort normal and breath sounds normal. No wheezes or rales  Abdominal: Soft. Bowel sounds are normal. She exhibits no distension. There is no tenderness.  Musculoskeletal:  Bilateral thenar atrophy. Left knee and ankle with minimal edema. Left foot tender.  Lymphadenopathy:  She has no cervical adenopathy.  Neurological: She is alert and oriented to person, place, and time.  Speech clear without dysphonia. Follows commands without difficulty. RUE 3+ prox to 4/5 distally LUE is 3- prox to 4 distally. LE's are 3+ to4/5. HF, Left knee inhibited by  pain. Diminished sensation in hands more than feet    Assessment/Plan: 1. Functional deficits secondary to cervical myelopathy, CSF leak with associated deconditioning and weakness which require 3+ hours per day of interdisciplinary therapy in a comprehensive inpatient rehab setting. Physiatrist is providing close team supervision and 24 hour management of active medical problems listed below. Physiatrist and rehab team continue to assess barriers to discharge/monitor patient progress toward functional and medical goals. FIM: FIM - Bathing Bathing Steps Patient Completed: Chest;Right Arm;Left Arm;Abdomen;Front perineal area;Right upper leg;Left upper leg Bathing: 3: Mod-Patient completes 5-7 28f 10 parts or 50-74%  FIM - Upper Body Dressing/Undressing Upper body dressing/undressing steps patient completed: Thread/unthread right bra strap;Thread/unthread left bra strap;Thread/unthread right sleeve of pullover shirt/dresss;Thread/unthread left sleeve of pullover shirt/dress;Put head through opening of pull over shirt/dress;Pull shirt over trunk Upper body dressing/undressing: 4: Min-Patient completed 75 plus % of tasks FIM - Lower Body Dressing/Undressing Lower body dressing/undressing steps patient completed: Thread/unthread right underwear leg;Thread/unthread left underwear leg;Pull underwear up/down;Thread/unthread right pants leg;Thread/unthread left pants leg;Pull pants up/down;Don/Doff right sock;Don/Doff left sock Lower body dressing/undressing: 2: Max-Patient completed 25-49% of tasks  FIM - Toileting Toileting: 0: Activity did not occur  FIM - Air cabin crew Transfers: 0-Activity did not occur  FIM - Control and instrumentation engineer Devices: Bed rails;Arm rests Bed/Chair Transfer: 3: Chair or W/C > Bed: Mod A (lift or lower assist)  FIM - Locomotion: Wheelchair Distance: 45' FIM - Locomotion: Ambulation Ambulation/Gait Assistance: 3: Mod  assist  Comprehension Comprehension Mode: Auditory Comprehension: 5-Follows basic conversation/direction: With no assist  Expression Expression Mode: Verbal Expression: 5-Expresses complex 90% of the time/cues < 10% of the time  Social Interaction Social Interaction: 5-Interacts appropriately 90% of the time - Needs monitoring or encouragement for participation or interaction.  Problem Solving Problem Solving: 5-Solves basic 90% of the time/requires cueing < 10% of the time  Memory Memory: 5-Requires cues to use assistive device  Medical Problem List and Plan:  1. DVT Prophylaxis/Anticoagulation: Pharmaceutical: Lovenox  2. Endstage DJD left Knee/Pain Management: voltaren gel  3. Mood: No signs of distress. Motivated to get better and home. Will have LCSW follow for evaluation.  4. Neuropsych: This patient is capable of making decisions on her own behalf.  5. Neurogenic bladder:  ua neg, culture negative, continue with urecholine/flomax.  6. Dental pain: has been treated with IV antibiotics. Need to emphasize oral care--Peridex mouth wash after meals. Augmentin continue for a total of 5-7 days   -now afebrile   -wbc's only 9.8 7. Constipation: Component of neurogenic bowel?   miralax bid.  LOS (Days) 3 A FACE TO FACE EVALUATION WAS PERFORMED  SWARTZ,ZACHARY T 08/25/2013 7:49 AM

## 2013-08-25 NOTE — Progress Notes (Signed)
Occupational Therapy Session Note  Patient Details  Name: Colleen Nunez MRN: [id number] Date of Birth: October 06, 1956  Today's Date: 08/25/2013 Time: 1100-1200 Time Calculation (min): 60 min  Short Term Goals: Week 1:  OT Short Term Goal 1 (Week 1): Short Term Goals = Long Term Goals secondary to ELOS      Skilled Therapeutic Interventions/Progress Updates:    pt sitting in w/c upon arrival. Pt engaged in bathing and dressing tasks while sitting in w/c in front of sink. Pt washed 8/10 parts for bathing requiring min A. Pt threaded r and l arm in bra, and hooked bra. Pt donned pull over shirt requiring set up for upper body dressing. Pt declined changing into clean pair of pants put able to pull pants down to wash front and back peri areas. Pt stated had to use restroom while standing at sink. Therapist grabbed [facility], pt opted to just put bed pan on wheelchair. Pt sat down to urinate. Pt completed 3/3 steps for toiletting.  Therapist donned brief, and pt able to wipe and pull pants up. Pt completes 4/4 grooming tasks with set up to obtain supplies. Focus of session self care, dynamic standing balance, activity tolerance. Left pt sitting in w/c with call bell and phone in reach.   Therapy Documentation Precautions:  Precautions Precautions: Fall Required Braces or Orthoses: Other Brace/Splint Other Brace/Splint: pt normally uses Lt knee brace at home (does not have here; elastic with 2 velcro straps above and below knee) Restrictions Weight Bearing Restrictions: No    Pain: Pain Assessment Pain Assessment: No/denies pain  See FIM for current functional status  Therapy/Group: Individual Therapy  Lajuana Ripple 08/25/2013, 2:53 PM

## 2013-08-26 ENCOUNTER — Inpatient Hospital Stay (HOSPITAL_COMMUNITY): Payer: Medicaid Other

## 2013-08-26 ENCOUNTER — Inpatient Hospital Stay (HOSPITAL_COMMUNITY): Payer: Medicaid Other | Admitting: Physical Therapy

## 2013-08-26 MED ORDER — BETHANECHOL CHLORIDE 25 MG PO TABS
25.0000 mg | ORAL_TABLET | Freq: Three times a day (TID) | ORAL | Status: DC
  Administered 2013-08-26 – 2013-08-30 (×13): 25 mg via ORAL
  Filled 2013-08-26 (×16): qty 1

## 2013-08-26 MED FILL — Pyrethrins-Piperonyl Butoxide Shampoo 0.33-4%: CUTANEOUS | Qty: 118 | Status: AC

## 2013-08-26 NOTE — Progress Notes (Signed)
Occupational Therapy Session Note  Patient Details  Name: Colleen Nunez MRN: [id number] Date of Birth: 09-Dec-1955  Today's Date: 08/26/2013 Time: 1100-1200 Time Calculation (min): 60 min  Short Term Goals: Week 1:  OT Short Term Goal 1 (Week 1): Short Term Goals = Long Term Goals secondary to ELOS :     Skilled Therapeutic Interventions/Progress Updates:    pt sitting in w/c upon arrival. Pt stated she needed to use restroom. Pt expressed she hadn't been able to release her bowels since Tuesday, and was feeling crampy, and sweaty. Pt ambulated to and from w/c to toilet with RW requiring close supervision. Pt sat on toilet for 15 minutes, and was unable to void bowels, pt did void small amount of urine. Pt completed 3/3 tasks for toilettng with supervision. Pt engaged in bathing and dressing tasks while sitting in w/c in front of sink. Pt washed 6/10 parts, pt declined washing lower legs and feet, pt completed bathing with set up. Pt threaded r and l arm into bra, required assistance to hook bra. Pt donned pull over shirt with set up. Pt declined removing pants to put on clean pair, therefore pt didn't thread r and l leg, pt did pull pants up and down when engaged in bathing tasks. Pt reported that pain in leg caused her to be weak today. Pt stated her leg brace left on her overnight made her leg hurt. Pt reported score of 4 out of 1-10 for pain. Pt required Max A for lower body dressing. Pt completed 2/4 tasks for grooming with set up. Focus of session self car, transfers, activity tolerance. Left pt sitting in w/c with call bell and phone in reach.  Therapy Documentation Precautions:  Precautions Precautions: Fall Required Braces or Orthoses: Other Brace/Splint Other Brace/Splint: pt normally uses Lt knee brace at home (does not have here; elastic with 2 velcro straps above and below knee) Restrictions Weight Bearing Restrictions: No  Pain: Pain Assessment Pain Assessment: 0-10 Pain  Score: 4  Pain Type: Acute pain Pain Location: Leg Pain Orientation: Left Pain Descriptors / Indicators: Aching Pain Onset: With Activity  See FIM for current functional status  Therapy/Group: Individual Therapy  Lajuana Ripple 08/26/2013, 12:12 PM

## 2013-08-26 NOTE — Progress Notes (Signed)
Occupational Therapy Session Note  Patient Details  Name: Colleen Nunez MRN: [id number] Date of Birth: 1956/02/19  Today's Date: 08/26/2013 Time: 1400-1415 Time Calculation (min): 15 min  Short Term Goals: Week 1:  OT Short Term Goal 1 (Week 1): Short Term Goals = Long Term Goals secondary to ELOS  Skilled Therapeutic Interventions/Progress Updates:    Pt resting in w/c upon arrival.  Pt stated her left shin hurt from sleeping in her knee brace the previous night.  Pt declined to practice home mgmt tasks with RW because it hurt too much.  Pt also c/o that her stomach hurt because she had not moved her bowels.  Discussed discharge plans.  Pt states that her bed sits high off the ground and she often has to use a stool or the bottom rail.  When asked how she gets out of bed patient states that she just slides off to stand on the floor.  Pt had told earlier therapist that her son helped her out of bed.  Pt already owns a BSC that she keeps placed beside her bed.  Pt states she currently bathes in bedroom after she brings water from bathroom.  Pt is not an accurate historian and it has been difficulty to determine prior level of activity and independence.  Therapy Documentation Precautions:  Precautions Precautions: Fall Required Braces or Orthoses: Other Brace/Splint Other Brace/Splint: pt normally uses Lt knee brace at home (does not have here; elastic with 2 velcro straps above and below knee) Restrictions Weight Bearing Restrictions: No General: General Amount of Missed OT Time (min): 15 Minutes Pain: Pain Assessment Pain Assessment: 0-10 Pain Score: Asleep Pain Type: Acute pain Pain Location: Leg Pain Orientation: Left Pain Descriptors / Indicators: Aching Pain Onset: On-going Patients Stated Pain Goal: 3 Pain Intervention(s): RN made aware  See FIM for current functional status  Therapy/Group: Individual Therapy  Leroy Libman 08/26/2013, 3:18 PM

## 2013-08-26 NOTE — Progress Notes (Signed)
Subjective/Complaints: [name] about her progress in therapy. Walked back and forth to gym yesterday  A 12 point review of systems has been performed and if not noted above is otherwise negative.   Objective: Vital Signs: Blood pressure 100/68, pulse 110, temperature 98.8 F (37.1 C), temperature source Oral, resp. rate 19, height 5\' 2"  (1.575 m), weight 79.878 kg (176 lb 1.6 oz), SpO2 93.00%. No results found. No results found for this basename: WBC, HGB, HCT, PLT,  in the last 72 hours No results found for this basename: NA, K, CL, CO, GLUCOSE, BUN, CREATININE, CALCIUM,  in the last 72 hours CBG (last 3)  No results found for this basename: GLUCAP,  in the last 72 hours  Wt Readings from Last 3 Encounters:  08/24/13 79.878 kg (176 lb 1.6 oz)  08/19/13 77.4 kg (170 lb 10.2 oz)  08/19/13 77.4 kg (170 lb 10.2 oz)    Physical Exam:  Constitutional: She is oriented to person, place, and time. She appears well-developed and well-nourished.  HENT: poor dentition, missing teeth Head: Normocephalic and atraumatic.  Right Ear: External ear normal.  Left Ear: External ear normal.  Eyes: Conjunctivae and EOM are normal. Pupils are equal, round, and reactive to light.  Neck: Normal range of motion. Neck supple. No JVD present. No tracheal deviation present. No thyromegaly present.  Cardiovascular: Normal rate and regular rhythm. No murmur  Pulmonary/Chest: Effort normal and breath sounds normal. No wheezes or rales  Abdominal: Soft. Bowel sounds are normal. She exhibits no distension. There is no tenderness.  Musculoskeletal:  Bilateral thenar atrophy. Left knee and ankle with minimal edema. Left foot tender.  Lymphadenopathy:  She has no cervical adenopathy.  Neurological: She is alert and oriented to person, place, and time.  Speech clear without dysphonia. Follows commands without difficulty. RUE 3+ prox to 4/5 distally LUE is 3- prox to 4 distally. LE's are  4/5. HF, Left knee  still inhibited by pain. Wearing old knee brace in bed. Diminished sensation in hands more than feet    Assessment/Plan: 1. Functional deficits secondary to cervical myelopathy, CSF leak with associated deconditioning and weakness which require 3+ hours per day of interdisciplinary therapy in a comprehensive inpatient rehab setting. Physiatrist is providing close team supervision and 24 hour management of active medical problems listed below. Physiatrist and rehab team continue to assess barriers to discharge/monitor patient progress toward functional and medical goals. FIM: FIM - Bathing Bathing Steps Patient Completed: Chest;Right Arm;Left Arm;Abdomen;Front perineal area;Buttocks;Right upper leg;Left upper leg Bathing: 4: Min-Patient completes 8-9 3f 10 parts or 75+ percent  FIM - Upper Body Dressing/Undressing Upper body dressing/undressing steps patient completed: Thread/unthread right bra strap;Thread/unthread left bra strap;Hook/unhook bra;Thread/unthread right sleeve of pullover shirt/dresss;Thread/unthread left sleeve of pullover shirt/dress;Put head through opening of pull over shirt/dress;Pull shirt over trunk Upper body dressing/undressing: 5: Set-up assist to: Obtain clothing/put away FIM - Lower Body Dressing/Undressing Lower body dressing/undressing steps patient completed: Pull pants up/down Lower body dressing/undressing: 5: Set-up assist to: Obtain clothing  FIM - Toileting Toileting steps completed by patient: Adjust clothing prior to toileting;Performs perineal hygiene;Adjust clothing after toileting Toileting: 5: Set-up assist to: Obtain supplies  FIM - Air cabin crew Transfers: 0-Activity did not occur  FIM - Control and instrumentation engineer Devices: Bed rails;Arm rests Bed/Chair Transfer: 5: Supine > Sit: Supervision (verbal cues/safety issues);5: Bed > Chair or W/C: Supervision (verbal cues/safety issues);5: Chair or W/C > Bed: Supervision  (verbal cues/safety issues)  FIM - Locomotion: Wheelchair Distance: 68'  Locomotion: Wheelchair: 5: Travels 150 ft or more: maneuvers on rugs and over door sills with supervision, cueing or coaxing FIM - Locomotion: Ambulation Locomotion: Ambulation Assistive Devices: Administrator Ambulation/Gait Assistance: 4: Min guard Locomotion: Ambulation: 2: Travels 50 - 149 ft with minimal assistance (Pt.>75%)  Comprehension Comprehension Mode: Auditory Comprehension: 5-Follows basic conversation/direction: With no assist  Expression Expression Mode: Verbal Expression: 5-Expresses complex 90% of the time/cues < 10% of the time  Social Interaction Social Interaction: 5-Interacts appropriately 90% of the time - Needs monitoring or encouragement for participation or interaction.  Problem Solving Problem Solving: 5-Solves basic 90% of the time/requires cueing < 10% of the time  Memory Memory: 5-Requires cues to use assistive device  Medical Problem List and Plan:  1. DVT Prophylaxis/Anticoagulation: Pharmaceutical: Lovenox  2. Endstage DJD left Knee/Pain Management: voltaren gel, knee cage per [facility] 3. Mood: No signs of distress. Motivated to get better and home.  4. Neuropsych: This patient is capable of making decisions on her own behalf.  5. Neurogenic bladder:  ua neg, culture negative, continue with urecholine/flomax---will decrease urecholine slightly this weekend  6. Dental pain: has been treated with IV antibiotics. Need to emphasize oral care--Peridex mouth wash after meals. Augmentin continue through the weekend then stop.  -now afebrile   -wbc's only 9.8, all cultures negative 7. Constipation: Component of neurogenic bowel?   miralax bid.  LOS (Days) 4 A FACE TO FACE EVALUATION WAS PERFORMED  Colleen Nunez T 08/26/2013 7:43 AM

## 2013-08-26 NOTE — Progress Notes (Signed)
Note reviewed and accurately reflects treatment session.   

## 2013-08-26 NOTE — Progress Notes (Signed)
Physical Therapy Session Note  Patient Details  Name: Colleen Nunez MRN: [id number] Date of Birth: 03-30-1956  Today's Date: 08/26/2013 Time: 0930-1027 Time Calculation (min): 57 min  Short Term Goals: Week 1:  PT Short Term Goal 1 (Week 1): = long term goals   Skilled Therapeutic Interventions/Progress Updates:    First half of session spent on orthotics evaluation for Lt. Knee brace to minimize hyperextension, prosthetist/orthotist recommended locking hinged brace for ease of don/doffing and pt in agreement (as opposed to knee cage). Orthotists ordering brace, should arrive by Monday. Pt reports no pain with ambulation using trial brace recommended by orthotist.   Pt sore from leaving old brace on too long last night, educated on allowing leg time to breath and avoiding leaving brace on too long, nursing also made aware. No skin compromise noted.  PT demonstration of car transfer, pt performing with supervision and RW. Discussed transportation at home, pt reports she typically uses public transportation (recommended using lift as opposed to steps). Also uses friends for rides discussed safety and need to only use vehicles which she can access easily and avoid having to step up into.   Second Session Skilled Therapeutic Interventions/Progress Updates:  Time:  [id number] Time Calculation (min): 45 min Pain: Faces 4/10 Lt. shin  Pt reports her Lt. Shin still sore from old brace. Feels it is a result of leaving it on over night (re-educated on need to always avoid doing this) and from increased ambulation yesterday with new brace resulting in tenderness. Pt able to tolerate 50' ambulation with RW and brace with soft towel under shin strap however then reported she would rather ride in chair due to pain. Rest of session focused on transfer training to/from elevated surface (high bed at home). Pt difficult to retrieve history from and provides inconsistent information. Uncertain at this point how  pt has been managing at home and what support is available.   Therapy Documentation Precautions:  Precautions Precautions: Fall Required Braces or Orthoses: Other Brace/Splint Other Brace/Splint: pt normally uses Lt knee brace at home (does not have here; elastic with 2 velcro straps above and below knee) Restrictions Weight Bearing Restrictions: No Pain: Pain Assessment Pain Assessment: No/denies pain  See FIM for current functional status  Therapy/Group: Individual Therapy  Ladona Horns Cheek 08/26/2013, 12:00 PM

## 2013-08-27 ENCOUNTER — Inpatient Hospital Stay (HOSPITAL_COMMUNITY): Payer: Medicaid Other | Admitting: *Deleted

## 2013-08-27 ENCOUNTER — Inpatient Hospital Stay (HOSPITAL_COMMUNITY): Payer: Medicaid Other | Admitting: Physical Therapy

## 2013-08-27 DIAGNOSIS — K5900 Constipation, unspecified: Secondary | ICD-10-CM

## 2013-08-27 DIAGNOSIS — F411 Generalized anxiety disorder: Secondary | ICD-10-CM

## 2013-08-27 DIAGNOSIS — M4712 Other spondylosis with myelopathy, cervical region: Secondary | ICD-10-CM

## 2013-08-27 NOTE — Progress Notes (Signed)
Occupational Therapy Note   Patient Details  Name: Colleen Nunez MRN: [id number] Date of Birth: 10-24-1956 Today's Date: 08/27/2013  Time:0930-1030   1st session Pain: none Individual session  1st session:  Pt. Asleep in bed with breakfast tray to side untouched.    Awakened pt.  She required much encouragement to participate in session.  Agreed to sit on side of bed for bath.  Required increased time for bathing and dressing.  Pt. Able to get panet on legs without AE. Pt. Stood with knee brace to wash peri area and minimal assist for balance.  Pt. Stated she does not get out of bed on Saturday when she is at home until noon.     2nd session:  Time:  [id number] Pain:none Individual session Engaged in therapeutic exercises in gym.  Addressed wc mobility, sit to stand in parallel bars, standing balance with no UE support for 10 seconds x4.  Pt. Stood for 30-40 seconds while doing arm arm movements to challenge balance.  Did weighted ball toss unsupported sitting for 1 minute.  Propelled wc back to room.  Left pt in wc  and call bell,phone within reach.  Son, Elberta Fortis present.     Lisa Roca 08/27/2013, 9:42 AM

## 2013-08-27 NOTE — Progress Notes (Signed)
Colleen Nunez is a 57 y.o. female November 10, 1956 [id number]  Subjective: Not hungry. No new problems. Slept well. Feeling OK.  Objective: Vital signs in last 24 hours: Temp:  [98 F (36.7 C)-100 F (37.8 C)] 99.5 F (37.5 C) (10/11 0700) Pulse Rate:  [101-107] 107 (10/11 0530) Resp:  [18] 18 (10/11 0530) BP: (105-116)/(56-67) 116/67 mmHg (10/11 0530) SpO2:  [95 %-99 %] 95 % (10/11 0530) Weight change:  Last BM Date: 08/26/13  Intake/Output from previous day: 10/10 0701 - 10/11 0700 In: 600 [P.O.:600] Out: 450 [Urine:450] Last cbgs: CBG (last 3)  No results found for this basename: GLUCAP,  in the last 72 hours   Physical Exam General: No apparent distress    HEENT: moist mucosa Lungs: Normal effort. Lungs clear to auscultation, no crackles or wheezes. Cardiovascular: Regular rate and rhythm, no edema Abdomen: S/NT/ND; BS(+) Musculoskeletal:  No change from before Neurological: No new neurological deficits Wounds: N/A    Skin: clear Alert, cooperative   Lab Results: BMET    Component Value Date/Time   NA 138 08/23/2013 0535   K 4.0 08/23/2013 0535   CL 102 08/23/2013 0535   CO2 25 08/23/2013 0535   GLUCOSE 106* 08/23/2013 0535   BUN 20 08/23/2013 0535   CREATININE 1.34* 08/23/2013 0535   CALCIUM 9.2 08/23/2013 0535   GFRNONAA 43* 08/23/2013 0535   GFRAA 50* 08/23/2013 0535   CBC    Component Value Date/Time   WBC 9.8 08/23/2013 0535   RBC 3.59* 08/23/2013 0535   RBC 2.58* 08/11/2013 0817   HGB 9.6* 08/23/2013 0535   HCT 28.5* 08/23/2013 0535   PLT 188 08/23/2013 0535   MCV 79.4 08/23/2013 0535   MCH 26.7 08/23/2013 0535   MCHC 33.7 08/23/2013 0535   RDW 15.7* 08/23/2013 0535   LYMPHSABS 3.6 08/23/2013 0535   MONOABS 0.9 08/23/2013 0535   EOSABS 0.2 08/23/2013 0535   BASOSABS 0.0 08/23/2013 0535    Studies/Results: No results found.  Medications: I have reviewed the patient's current medications.  Assessment/Plan:  1. DVT Prophylaxis/Anticoagulation:  Pharmaceutical: Lovenox  2. Endstage DJD left Knee/Pain Management: voltaren gel, knee cage per [facility]  3. Mood: No signs of distress. Motivated to get better and home.  4. Neuropsych: This patient is capable of making decisions on her own behalf.  5. Neurogenic bladder: ua neg, culture negative, continue with urecholine/flomax---will decrease urecholine slightly this weekend  6. Dental pain: has been treated with IV antibiotics. Need to emphasize oral care--Peridex mouth wash after meals. Augmentin continue through the weekend then stop.  -now afebrile  -wbc's only 9.8, all cultures negative  7. Constipation: Component of neurogenic bowel? miralax bid. 8. Cervical myelopathy     Length of stay, days: 5  Walker Kehr , MD 08/27/2013, 10:12 AM

## 2013-08-27 NOTE — Progress Notes (Signed)
Physical Therapy Note  Patient Details  Name: Colleen Nunez MRN: [id number] Date of Birth: 08-11-1956 Today's Date: 08/27/2013  1100-1155 (55 minutes) individual Pain: no reported pain Focus of treatment: Therapeutic exercise focused on Left LE strengthening/ activity tolerance; gait training/ endurance Treatment: supine to sit bed (flat) SBA; assist to donn left hyperextension brace; transfers stand/turn RW min to SBA; Nustep Level 4 X 10 minutes; up/down 2 inch step on left /right LEs with RW support for quad strengthening X 5 ; gait- 49 feet X 2 RW min to close SBA with recurvatum brace left LE (with socks to prevent increased pressure from brace straps); wc mobility - 120 feet SBA on level surfaces.  1300-1340 (40 minutes) individual Pain: no complaint of pain Focus of treatment: Therapeutic activity focused on standing balance/ standing tolerance; gait training/ activity tolerance Treatment: Pt up in wc upon arrival ; transfer wc >< mat with RW min to SBA (recurvatum brace left LE); standing - X 2 placing horseshoes on basketball hoop for standing balance (SBA without AD) , tolerance (limited by fatigue - 1-2 minutes); marching in place 2 X 5 with RW; gait - 67 feet X 1 RW SBA with vcs for hand placement stand to sit ( decreased knee eccentric control stand to sit ) .   [name],JIM 08/27/2013, 11:12 AM

## 2013-08-28 ENCOUNTER — Inpatient Hospital Stay (HOSPITAL_COMMUNITY): Payer: Medicaid Other | Admitting: Physical Therapy

## 2013-08-28 DIAGNOSIS — I10 Essential (primary) hypertension: Secondary | ICD-10-CM

## 2013-08-28 DIAGNOSIS — F329 Major depressive disorder, single episode, unspecified: Secondary | ICD-10-CM

## 2013-08-28 NOTE — Progress Notes (Signed)
Physical Therapy Note  Patient Details  Name: Colleen Nunez MRN: [id number] Date of Birth: [birth year] Today's Date: 08/28/2013  1030-1110 (40 minutes) individual Pain: no reported pain Other: hyperextension brace left LE Focus of treatment: Therapeutic exercise focused on strengthening bilateral LEs / activity tolerance; gait training Treatment: supine to sit (bed flat) SBA ; transfers SBA RW; Nustep Level 4 X 10 minutes with no rest breaks; up/down 2 inch step alternating LEs X 10 with RW support : gait - 50 feet RW SBA.    Keiran Sias,JIM 08/28/2013, 10:38 AM

## 2013-08-28 NOTE — Progress Notes (Signed)
Colleen Nunez is a 57 y.o. female 28-Apr-1956 [id number]  Subjective:  Eating breakfast. No new problems. Slept well. Feeling OK. Pt is making it very warm in her room  Objective: Vital signs in last 24 hours: Temp:  [98.1 F (36.7 C)] 98.1 F (36.7 C) (10/12 0615) Pulse Rate:  [94-100] 100 (10/12 0615) Resp:  [19-20] 19 (10/12 0615) BP: (100-120)/(75-77) 100/75 mmHg (10/12 0615) SpO2:  [97 %-99 %] 99 % (10/12 0615) Weight change:  Last BM Date: 08/26/13  Intake/Output from previous day:   Last cbgs: CBG (last 3)  No results found for this basename: GLUCAP,  in the last 72 hours   Physical Exam General: No apparent distress    HEENT: moist mucosa Lungs: Normal effort. Lungs clear to auscultation, no crackles or wheezes. Cardiovascular: Regular rate and rhythm, no edema Abdomen: S/NT/ND; BS(+) Musculoskeletal:  No change from before Neurological: No new neurological deficits Wounds: N/A    Skin: clear Alert, cooperative   Lab Results: BMET    Component Value Date/Time   NA 138 08/23/2013 0535   K 4.0 08/23/2013 0535   CL 102 08/23/2013 0535   CO2 25 08/23/2013 0535   GLUCOSE 106* 08/23/2013 0535   BUN 20 08/23/2013 0535   CREATININE 1.34* 08/23/2013 0535   CALCIUM 9.2 08/23/2013 0535   GFRNONAA 43* 08/23/2013 0535   GFRAA 50* 08/23/2013 0535   CBC    Component Value Date/Time   WBC 9.8 08/23/2013 0535   RBC 3.59* 08/23/2013 0535   RBC 2.58* 08/11/2013 0817   HGB 9.6* 08/23/2013 0535   HCT 28.5* 08/23/2013 0535   PLT 188 08/23/2013 0535   MCV 79.4 08/23/2013 0535   MCH 26.7 08/23/2013 0535   MCHC 33.7 08/23/2013 0535   RDW 15.7* 08/23/2013 0535   LYMPHSABS 3.6 08/23/2013 0535   MONOABS 0.9 08/23/2013 0535   EOSABS 0.2 08/23/2013 0535   BASOSABS 0.0 08/23/2013 0535    Studies/Results: No results found.  Medications: I have reviewed the patient's current medications.  Assessment/Plan:  1. DVT Prophylaxis/Anticoagulation: Pharmaceutical: Lovenox  2. Endstage DJD  left Knee/Pain Management: voltaren gel, knee cage per [facility]  3. Mood: No signs of distress. Motivated to get better and home.  4. Neuropsych: This patient is capable of making decisions on her own behalf.  5. Neurogenic bladder: ua neg, culture negative, continue with urecholine/flomax---will decrease urecholine slightly this weekend  6. Dental pain: has been treated with IV antibiotics. Need to emphasize oral care--Peridex mouth wash after meals. Augmentin continue through the weekend then stop.  -now afebrile  -wbc's only 9.8, all cultures negative  7. Constipation: Component of neurogenic bowel? miralax bid. 8. Cervical myelopathy     Length of stay, days: 6  Walker Kehr , MD 08/28/2013, 8:48 AM

## 2013-08-29 ENCOUNTER — Inpatient Hospital Stay (HOSPITAL_COMMUNITY): Payer: Medicaid Other

## 2013-08-29 ENCOUNTER — Inpatient Hospital Stay (HOSPITAL_COMMUNITY): Payer: Medicaid Other | Admitting: Physical Therapy

## 2013-08-29 LAB — COMPREHENSIVE METABOLIC PANEL
ALT: 15 U/L (ref 0–35)
Albumin: 2.7 g/dL — ABNORMAL LOW (ref 3.5–5.2)
Alkaline Phosphatase: 59 U/L (ref 39–117)
Calcium: 9.3 mg/dL (ref 8.4–10.5)
Creatinine, Ser: 1.75 mg/dL — ABNORMAL HIGH (ref 0.50–1.10)
GFR calc Af Amer: 36 mL/min — ABNORMAL LOW (ref >90)
GFR calc non Af Amer: 31 mL/min — ABNORMAL LOW (ref >90)
Glucose, Bld: 99 mg/dL (ref 70–99)
Potassium: 3.6 mEq/L (ref 3.5–5.1)
Sodium: 134 mEq/L — ABNORMAL LOW (ref 135–145)
Total Protein: 6.9 g/dL (ref 6.0–8.3)

## 2013-08-29 LAB — CULTURE, BLOOD (ROUTINE X 2)
Culture: NO GROWTH
Culture: NO GROWTH

## 2013-08-29 LAB — CREATININE, SERUM
Creatinine, Ser: 1.71 mg/dL — ABNORMAL HIGH (ref 0.50–1.10)
GFR calc non Af Amer: 32 mL/min — ABNORMAL LOW (ref >90)

## 2013-08-29 NOTE — Progress Notes (Signed)
Physical Therapy Session Note  Patient Details  Name: Colleen Nunez MRN: [id number] Date of Birth: June 08, 1956  Today's Date: 08/29/2013 Time: 0830-0930 Time Calculation (min): 60 min  Short Term Goals: Week 1:  PT Short Term Goal 1 (Week 1): = long term goals   Skilled Therapeutic Interventions/Progress Updates:    Improved pain modulation today but having hot flashes. Ambulation x 66' with RW and supervision cues for posture and proximity of RW. Problem solved through home bed set-up. Practiced transfer from wheelchair (simulated bedside commode) to/from elevated bed with use of short stool. Pt performed with supervision. Bathroom mobility ambulating with RW and supervision, overall moves at a safe speed.  Knee cage utilized during all standing mobility, brace removed at end of session for pressure relief. Pt continues to require max questioning to get a consistent answer for home environment and set up.   Second Session Skilled Therapeutic Interventions/Progress Updates:  Time:  [phone]; [phone] Time Calculation (min): 30; 53min Session focused on static and dynamic standing balance with minimizing UE support, varying base of support. Worked on decreasing visual overcompensation. Ambulation x 3' with RW and Lt. Knee cage, supervision pt taking intermittent rest breaks.   Prosthetist Gerald Stabs) arrived at 1525 with brace. Practiced donning brace and ambulating with new brace and RW, supervision. Brace adjusted by Gerald Stabs. Pt reports same comfort as with knee cage, reports no pain in Lt. Knee and demonstrates improved gait stability with new brace.    Therapy Documentation Precautions:  Precautions Precautions: Fall Required Braces or Orthoses: Other Brace/Splint Other Brace/Splint: pt normally uses Lt knee brace at home (does not have here; elastic with 2 velcro straps above and below knee) Restrictions Weight Bearing Restrictions: No Pain: Pain Assessment Pain Assessment:  No/denies pain Pain Score: 0-No pain either session  See FIM for current functional status  Therapy/Group: Individual Therapy both sessions  Lahoma Rocker 08/29/2013, 12:12 PM

## 2013-08-29 NOTE — Progress Notes (Signed)
Subjective/Complaints: Woke up with some nausea this am. Has since resolved. Anxious to go home.   A 12 point review of systems has been performed and if not noted above is otherwise negative.   Objective: Vital Signs: Blood pressure 106/68, pulse 113, temperature 100.3 F (37.9 C), temperature source Oral, resp. rate 20, height 5\' 2"  (1.575 m), weight 79.878 kg (176 lb 1.6 oz), SpO2 100.00%. No results found. No results found for this basename: WBC, HGB, HCT, PLT,  in the last 72 hours  Recent Labs  08/29/13 0540  CREATININE 1.71*   CBG (last 3)  No results found for this basename: GLUCAP,  in the last 72 hours  Wt Readings from Last 3 Encounters:  08/24/13 79.878 kg (176 lb 1.6 oz)  08/19/13 77.4 kg (170 lb 10.2 oz)  08/19/13 77.4 kg (170 lb 10.2 oz)    Physical Exam:  Constitutional: She is oriented to person, place, and time. She appears well-developed and well-nourished.  HENT: poor dentition, missing teeth Head: Normocephalic and atraumatic.  Right Ear: External ear normal.  Left Ear: External ear normal.  Eyes: Conjunctivae and EOM are normal. Pupils are equal, round, and reactive to light.  Neck: Normal range of motion. Neck supple. No JVD present. No tracheal deviation present. No thyromegaly present.  Cardiovascular: Normal rate and regular rhythm. No murmur  Pulmonary/Chest: Effort normal and breath sounds normal. No wheezes or rales  Abdominal: Soft. Bowel sounds are normal. She exhibits no distension. There is no tenderness.  Musculoskeletal:  Bilateral thenar atrophy. Left knee and ankle with minimal edema. Left foot tender.  Lymphadenopathy:  She has no cervical adenopathy.  Neurological: She is alert and oriented to person, place, and time.  Speech clear without dysphonia. Follows commands without difficulty. RUE 3+ prox to 4/5 distally LUE is 3- prox to 4 distally. LE's are  4/5. HF, Left knee still inhibited by pain. Wearing old knee brace in bed.  Diminished sensation in hands more than feet    Assessment/Plan: 1. Functional deficits secondary to cervical myelopathy, CSF leak with associated deconditioning and weakness which require 3+ hours per day of interdisciplinary therapy in a comprehensive inpatient rehab setting. Physiatrist is providing close team supervision and 24 hour management of active medical problems listed below. Physiatrist and rehab team continue to assess barriers to discharge/monitor patient progress toward functional and medical goals. FIM: FIM - Bathing Bathing Steps Patient Completed: Chest;Right Arm;Left Arm;Abdomen;Right upper leg;Left upper leg;Front perineal area;Buttocks Bathing: 3: Mod-Patient completes 5-7 44f 10 parts or 50-74%  FIM - Upper Body Dressing/Undressing Upper body dressing/undressing steps patient completed: Thread/unthread right bra strap;Thread/unthread left bra strap;Thread/unthread right sleeve of pullover shirt/dresss;Thread/unthread left sleeve of pullover shirt/dress;Put head through opening of pull over shirt/dress;Pull shirt over trunk Upper body dressing/undressing: 5: Supervision: Safety issues/verbal cues FIM - Lower Body Dressing/Undressing Lower body dressing/undressing steps patient completed: Thread/unthread right pants leg;Thread/unthread left pants leg Lower body dressing/undressing: 2: Max-Patient completed 25-49% of tasks  FIM - Toileting Toileting steps completed by patient: Performs perineal hygiene Toileting Assistive Devices: Grab bar or rail for support Toileting: 2: Max-Patient completed 1 of 3 steps  FIM - Radio producer Devices: Insurance account manager Transfers: 5-To toilet/BSC: Supervision (verbal cues/safety issues);4-From toilet/BSC: Min A (steadying Pt. > 75%)  FIM - Bed/Chair Transfer Bed/Chair Transfer Assistive Devices: Bed rails;Arm rests Bed/Chair Transfer: 4: Bed > Chair or W/C: Min A (steadying Pt. > 75%);4: Chair or W/C > Bed:  Min A (steadying Pt. > 75%)  FIM - Locomotion: Wheelchair Distance: 45' Locomotion: Wheelchair: 5: Travels 150 ft or more: maneuvers on rugs and over door sills with supervision, cueing or coaxing FIM - Locomotion: Ambulation Locomotion: Ambulation Assistive Devices: Administrator Ambulation/Gait Assistance: 5: Supervision Locomotion: Ambulation: 1: Travels less than 50 ft with minimal assistance (Pt.>75%)  Comprehension Comprehension Mode: Auditory Comprehension: 5-Follows basic conversation/direction: With no assist  Expression Expression Mode: Verbal Expression: 5-Expresses basic needs/ideas: With no assist  Social Interaction Social Interaction: 5-Interacts appropriately 90% of the time - Needs monitoring or encouragement for participation or interaction.  Problem Solving Problem Solving: 5-Solves complex 90% of the time/cues < 10% of the time  Memory Memory: 5-Requires cues to use assistive device  Medical Problem List and Plan:  1. DVT Prophylaxis/Anticoagulation: Pharmaceutical: Lovenox  2. Endstage DJD left Knee/Pain Management: voltaren gel, knee cage per [facility] 3. Mood: No signs of distress. Motivated to get better and home.  4. Neuropsych: This patient is capable of making decisions on her own behalf.  5. Neurogenic bladder:  ua neg, culture negative, continue with flomax---will wean urecholine off  6. Dental pain: has been treated with IV antibiotics. On augmentin--stop  -low grade temp still noted early this am--observe, no other signs or sx. (?nausea)  -now afebrile   -wbc's only 9.8, all cultures negative 7. Constipation: Component of neurogenic bowel?   miralax bid.  LOS (Days) 7 A FACE TO FACE EVALUATION WAS PERFORMED  Darcia Lampi T 08/29/2013 7:38 AM

## 2013-08-29 NOTE — Progress Notes (Signed)
Occupational Therapy Session Note  Patient Details  Name: Colleen Nunez MRN: [id number] Date of Birth: 03-26-1956  Today's Date: 08/29/2013  Session 1 Time: 1100-1200 Time Calculation (min): 60 min  Short Term Goals: Week 1:  OT Short Term Goal 1 (Week 1): Short Term Goals = Long Term Goals secondary to ELOS  Skilled Therapeutic Interventions/Progress Updates:    Pt resting in w/c upon arrival.  Pt stated she didn't have any clean clothes but still needed to wash her UB and her "bottom".  Pt amb with RW to sink for bathing tasks before sitting in w/c to compete UB bathing.  Pt stood at sink to bathe LB except for feet.  Pt did not doff Lt knee brace or doff pants to perform LB bathing.  Pt completed all bathing tasks without assistance and declined to bathe feet this morning. Pt stood at sink to complete grooming tasks.  Pt required extra time to complete all tasks.  Pt stated that she bathed at EOB or in chair in living room at home and her son gathered her supplies and brought water to her.  Focus on activity tolerance, sit<>stand, standing balance, and safety awareness.  Therapy Documentation Precautions:  Precautions Precautions: Fall Required Braces or Orthoses: Other Brace/Splint Other Brace/Splint: pt normally uses Lt knee brace at home (does not have here; elastic with 2 velcro straps above and below knee) Restrictions Weight Bearing Restrictions: No   Pain: Pain Assessment Pain Assessment: No/denies pain Pain Score: 0-No pain  See FIM for current functional status  Therapy/Group: Individual Therapy  Session 2 Time: [id number] Pt denies pain Individual Therapy  Pt amb with RW from room to ADL apartment to engage in functional amb with RW for home mgmt tasks.  Pt stated that she did not go into bathroom at home but used a BSC in her room.  Pt stated that her bed was relatively tall and that often she used a step stool to assist with getting into bed.  Pt transitioned  to therapy gym to practice bed mobility using hi-low therapy mat.  Pt was able to scoot back onto mat after sitting and perform sit<>supine without assistance.  Pt performed transfers with close supervision.  Pt amb with RW approx 50' on the way back to room before requesting to sit in w/c.  Focus on functional amb, transfers, sit<>stand, home mgmt tasks, and safety awareness.  [name] [facility] 08/29/2013, 12:34 PM

## 2013-08-30 ENCOUNTER — Inpatient Hospital Stay (HOSPITAL_COMMUNITY): Payer: Medicaid Other

## 2013-08-30 ENCOUNTER — Inpatient Hospital Stay (HOSPITAL_COMMUNITY): Payer: Medicaid Other | Admitting: Physical Therapy

## 2013-08-30 ENCOUNTER — Inpatient Hospital Stay (HOSPITAL_COMMUNITY): Payer: Medicaid Other | Admitting: Occupational Therapy

## 2013-08-30 LAB — CBC
HCT: 30 % — ABNORMAL LOW (ref 36.0–46.0)
Hemoglobin: 9.9 g/dL — ABNORMAL LOW (ref 12.0–15.0)
MCHC: 33 g/dL (ref 30.0–36.0)
MCV: 80 fL (ref 78.0–100.0)
RBC: 3.75 MIL/uL — ABNORMAL LOW (ref 3.87–5.11)
WBC: 8 10*3/uL (ref 4.0–10.5)

## 2013-08-30 MED ORDER — BETHANECHOL CHLORIDE 10 MG PO TABS
10.0000 mg | ORAL_TABLET | Freq: Three times a day (TID) | ORAL | Status: DC
  Administered 2013-08-30 (×2): 10 mg via ORAL
  Filled 2013-08-30 (×6): qty 1

## 2013-08-30 NOTE — Progress Notes (Signed)
Subjective/Complaints: Slept fairly well.   A 12 point review of systems has been performed and if not noted above is otherwise negative.   Objective: Vital Signs: Blood pressure 114/75, pulse 85, temperature 99.5 F (37.5 C), temperature source Oral, resp. rate 18, height 5\' 2"  (1.575 m), weight 79.878 kg (176 lb 1.6 oz), SpO2 97.00%. No results found.  Recent Labs  08/30/13 0500  WBC 8.0  HGB 9.9*  HCT 30.0*  PLT 277    Recent Labs  08/29/13 0540  NA 134*  K 3.6  CL 97  GLUCOSE 99  BUN 17  CREATININE 1.71*  1.75*  CALCIUM 9.3   CBG (last 3)  No results found for this basename: GLUCAP,  in the last 72 hours  Wt Readings from Last 3 Encounters:  08/24/13 79.878 kg (176 lb 1.6 oz)  08/19/13 77.4 kg (170 lb 10.2 oz)  08/19/13 77.4 kg (170 lb 10.2 oz)    Physical Exam:  Constitutional: She is oriented to person, place, and time. She appears well-developed and well-nourished.  HENT: poor dentition, missing teeth Head: Normocephalic and atraumatic.  Right Ear: External ear normal.  Left Ear: External ear normal.  Eyes: Conjunctivae and EOM are normal. Pupils are equal, round, and reactive to light.  Neck: Normal range of motion. Neck supple. No JVD present. No tracheal deviation present. No thyromegaly present.  Cardiovascular: Normal rate and regular rhythm. No murmur  Pulmonary/Chest: Effort normal and breath sounds normal. No wheezes or rales  Abdominal: Soft. Bowel sounds are normal. She exhibits no distension. There is no tenderness.  Musculoskeletal:  Bilateral thenar atrophy. Left knee and ankle with minimal edema. Left foot tender.  Lymphadenopathy:  She has no cervical adenopathy.  Neurological: She is alert and oriented to person, place, and time.  Speech clear without dysphonia. Follows commands without difficulty. RUE 3+ prox to 4/5 distally LUE is 3- prox to 4 distally. LE's are  4/5. HF, Left knee still inhibited by pain. Wearing old knee brace in  bed. Diminished sensation in hands more than feet    Assessment/Plan: 1. Functional deficits secondary to cervical myelopathy, CSF leak with associated deconditioning and weakness which require 3+ hours per day of interdisciplinary therapy in a comprehensive inpatient rehab setting. Physiatrist is providing close team supervision and 24 hour management of active medical problems listed below. Physiatrist and rehab team continue to assess barriers to discharge/monitor patient progress toward functional and medical goals. FIM: FIM - Bathing Bathing Steps Patient Completed: Chest;Right Arm;Left Arm;Abdomen;Front perineal area;Buttocks;Right upper leg;Left upper leg Bathing: 4: Min-Patient completes 8-9 69f 10 parts or 75+ percent  FIM - Upper Body Dressing/Undressing Upper body dressing/undressing steps patient completed: Thread/unthread left bra strap;Thread/unthread right bra strap;Hook/unhook bra;Thread/unthread right sleeve of pullover shirt/dresss;Thread/unthread left sleeve of pullover shirt/dress;Put head through opening of pull over shirt/dress;Pull shirt over trunk Upper body dressing/undressing: 5: Set-up assist to: Obtain clothing/put away FIM - Lower Body Dressing/Undressing Lower body dressing/undressing steps patient completed: Thread/unthread right pants leg;Thread/unthread left pants leg;Pull pants up/down;Don/Doff right sock;Don/Doff left sock Lower body dressing/undressing: 4: Min-Patient completed 75 plus % of tasks  FIM - Toileting Toileting steps completed by patient: Performs perineal hygiene Toileting Assistive Devices: Grab bar or rail for support Toileting: 2: Max-Patient completed 1 of 3 steps  FIM - Radio producer Devices: Insurance account manager Transfers: 5-To toilet/BSC: Supervision (verbal cues/safety issues);5-From toilet/BSC: Supervision (verbal cues/safety issues)  FIM - Bed/Chair Transfer Bed/Chair Transfer Assistive Devices: Bed  rails;Arm rests Bed/Chair Transfer:  5: Supine > Sit: Supervision (verbal cues/safety issues);5: Sit > Supine: Supervision (verbal cues/safety issues);4: Bed > Chair or W/C: Min A (steadying Pt. > 75%);4: Chair or W/C > Bed: Min A (steadying Pt. > 75%)  FIM - Locomotion: Wheelchair Distance: 45' Locomotion: Wheelchair: 5: Travels 150 ft or more: maneuvers on rugs and over door sills with supervision, cueing or coaxing FIM - Locomotion: Ambulation Locomotion: Ambulation Assistive Devices: Administrator Ambulation/Gait Assistance: 5: Supervision Locomotion: Ambulation: 2: Travels 50 - 149 ft with supervision/safety issues  Comprehension Comprehension Mode: Auditory Comprehension: 5-Follows basic conversation/direction: With no assist  Expression Expression Mode: Verbal Expression: 5-Expresses basic needs/ideas: With extra time/assistive device  Social Interaction Social Interaction: 5-Interacts appropriately 90% of the time - Needs monitoring or encouragement for participation or interaction.  Problem Solving Problem Solving: 5-Solves basic 90% of the time/requires cueing < 10% of the time  Memory Memory: 5-Requires cues to use assistive device  Medical Problem List and Plan:  1. DVT Prophylaxis/Anticoagulation: Pharmaceutical: Lovenox  2. Endstage DJD left Knee/Pain Management: voltaren gel, knee cage per [facility] 3. Mood: No signs of distress. Motivated to get better and home.  4. Neuropsych: This patient is capable of making decisions on her own behalf.  5. Neurogenic bladder:  ua neg, culture negative, continue with flomax---will wean urecholine off  6. Dental pain: has been treated with IV antibiotics. On augmentin--stopped  -low grade temp still noted at times--observe, no other signs or sx.   -now afebrile   -wbc's only 9.8, all cultures negative 7. Constipation: Component of neurogenic bowel?   miralax bid.  LOS (Days) 8 A FACE TO FACE EVALUATION WAS  PERFORMED  SWARTZ,ZACHARY T 08/30/2013 7:47 AM

## 2013-08-30 NOTE — Progress Notes (Signed)
Occupational Therapy Session Note  Patient Details  Name: Colleen Nunez MRN: [id number] Date of Birth: 29-Mar-1956  Today's Date: 08/30/2013 Time: [id number] Time Calculation (min): 35 min  Short Term Goals: Week 1:  OT Short Term Goal 1 (Week 1): Short Term Goals = Long Term Goals secondary to ELOS  Skilled Therapeutic Interventions/Progress Updates:  Patient resting in w/c upon arrival and stating how tired she was.  Engaged patient in IADL tasks at a walker level and toileting-toilet transfer.  Focused session on activity tolerance, standing tolerance, walker safety, energy conservation and sit><stands.  Patient required 2 seated rest breaks and on 2 occasions patient stopped ambulating with RW and took standing rest breaks.  While seated patient continued to perform simple HM task.  Patient left on the commode with NT in the room to assist.  Therapy Documentation Precautions:  Precautions Precautions: Fall Required Braces or Orthoses: Other Brace/Splint Other Brace/Splint: On 10/13, pt received a Left Hyperextention Brace (2 straps cross behind the knee), patient to practice donn & doff new brace Restrictions Weight Bearing Restrictions: No Pain: No reports of pain  Therapy/Group: Individual Therapy  Colleen Nunez 08/30/2013, 3:56 PM

## 2013-08-30 NOTE — Plan of Care (Signed)
Problem: RH BOWEL ELIMINATION Goal: RH STG MANAGE BOWEL WITH ASSISTANCE STG Manage Bowel with mod I Assistance.  Outcome: Not Progressing Last bm on 08/26/13 but refuses meds.Pt states she takes mom at home and sticky note left for MD asking about it.

## 2013-08-30 NOTE — Progress Notes (Signed)
Social Work Patient ID: Colleen Nunez, female   DOB: [birth year], 57 y.o.   MRN: [id number]  CSW spoke with pt 08-29-13 re: her home condition, as she had shared with Tom, OTA, that there was a leak in her ceiling in her bathroom and this keeps her from going in there due to fear of the ceiling dropping on her.  During [facility] visit, pt stated that leak is in her bedroom, not her bathroom, and that she is nervous to go into bedroom.  Pt described to CSW how her bathroom is set up and that she cannot take the w/c in there due to space.  CSW offered to call pt's landlord to discuss her concerns about the ceiling.  Pt was accepting of this and she wanted CSW to also mention that although the landlord put screens on her windows, she cannot get the windows open and she needs a ramp.  CSW told pt that landlord will most likely not agree to install a ramp for her and if he does, it may not be able to be done by Friday.  Pt expressed understanding.  CSW called landlord, Tiffany Kocher, and left a message.  Await return phone call.

## 2013-08-30 NOTE — Progress Notes (Signed)
NUTRITION FOLLOW-UP  DOCUMENTATION CODES Per approved criteria  -Obesity Unspecified   INTERVENTION: Discontinue Ensure Complete. Continue MVI daily.  NUTRITION DIAGNOSIS: Increased nutrient needs related to increased energy expenditure AEB intensive rehab program. Ongoing.  Monitor:  Pt meeting >/=90% estimated needs with tolerance.  ASSESSMENT: Pt has hx of cervical stenosis s/p cervical surgery. C/o emesis since procedure in July. Right foot pain today (elevated uric acid level). Stage IV kidney dz and UTI. Follows Regular diet at home.  Pt's intake has been largely 50-75% of meals. She is no longer drinking the Ensure Complete per RN.  Pt with h/o chronic wt loss, however acutely stable with adequate PO intake.   Per most recent interdisciplinary team note, expected d/c date is 10/17.  Height: Ht Readings from Last 1 Encounters:  08/22/13 5\' 2"  (1.575 m)    Weight: Wt Readings from Last 1 Encounters:  08/24/13 176 lb 1.6 oz (79.878 kg)  Admit wt 171 lb  BMI:  Body mass index is 32.2 kg/(m^2). Obese Class I  Estimated Nutritional Needs: Kcal: 1750-1900 Protein: 100-115 g Fluid: 1.8-2.0 L/day  Skin: neck incision  Diet Order: General  EDUCATION NEEDS: -No education needs identified at this time   Intake/Output Summary (Last 24 hours) at 08/30/13 1213 Last data filed at 08/30/13 0924  Gross per 24 hour  Intake    360 ml  Output    150 ml  Net    210 ml    Last BM: 10/10  Labs:   Recent Labs Lab 08/29/13 0540  NA 134*  K 3.6  CL 97  CO2 27  BUN 17  CREATININE 1.71*  1.75*  CALCIUM 9.3  GLUCOSE 99    CBG (last 3)  No results found for this basename: GLUCAP,  in the last 72 hours  Scheduled Meds: . amLODipine  10 mg Oral Daily  . bethanechol  10 mg Oral TID  . chlorhexidine  15 mL Mouth/Throat QID  . diclofenac sodium  2 g Topical QID  . enoxaparin (LOVENOX) injection  40 mg Subcutaneous Q24H  . feeding supplement (ENSURE COMPLETE)   237 mL Oral Q24H  . pantoprazole  40 mg Oral BID AC  . polyethylene glycol  17 g Oral Daily  . tamsulosin  0.4 mg Oral Daily    Continuous Infusions:    Inda Coke MS, RD, LDN Pager: [phone] After-hours pager: [phone]

## 2013-08-30 NOTE — Progress Notes (Signed)
Physical Therapy Weekly Progress Note  Patient Details  Name: Colleen Nunez MRN: [id number] Date of Birth: 01-Jan-1956  Today's Date: 08/30/2013 Time: [id number] Time Calculation (min): 55 min  Patient has met  6 of 10 long term goals.  Short term goals not set due to estimated length of stay.  Pt is progressing nicely however continues to require supervision for transfers (goal of modified independent), and needs min assist and use of two rails for steps (goal of supervision, only has one rail on Rt. For home entry). Pt recently received a brace to assist with excessive Lt. Knee hyperextension and will benefit from practice donning/doffing brace.   Patient continues to demonstrate the following deficits: decreased standing balance and safety with transfers, increased level of assist as compared to baseline and therefore will continue to benefit from skilled PT intervention to enhance overall performance with activity tolerance, balance, ability to compensate for deficits and awareness.  See Patient's Care Plan for progression toward long term goals.  Patient progressing toward long term goals..  Continue plan of care.  Skilled Therapeutic Interventions/Progress Updates:    Attempted to have pt don brace, training cut short due to urinary urgency. Bathroom mobility with RW and supervision, cues for RW management. Returned to bed and practiced donning brace with knee flexed to ~40 degrees (discovered pt able to don brace best while laying supine and lifting leg with hip at 90 degrees). Pt has some difficulty with upper aspect of brace and will benefit from more practice. Pt requires multiple rest breaks while donning brace, pt more fatigued in appearance today.  Ambulated x 30' with RW and supervision, brace fitting well however pt reported having "hot flashes" and need to stop. Pt reported no lightheadedness only "hot." HR 118, pt reported feeling sweaty but did not appear to have any perspiration  at the time. RN made aware and pt request for a fan.     Therapy Documentation Precautions:  Precautions Precautions: Fall Required Braces or Orthoses: Other Brace/Splint Other Brace/Splint: pt normally uses Lt knee brace at home (does not have here; elastic with 2 velcro straps above and below knee) Restrictions Weight Bearing Restrictions: No Pain: Pain Assessment Pain Assessment: No/denies pain Pain Score: 0-No pain   See FIM for current functional status  Therapy/Group: Individual Therapy  Lahoma Rocker 08/30/2013, 12:08 PM

## 2013-08-30 NOTE — Progress Notes (Signed)
Occupational Therapy Session Note  Patient Details  Name: Colleen Nunez MRN: [id number] Date of Birth: 1955/11/29  Today's Date: 08/30/2013  Session 1 Time: 1100-1145 Time Calculation (min): 45 min  Short Term Goals: Week 1:  OT Short Term Goal 1 (Week 1): Short Term Goals = Long Term Goals secondary to ELOS  Skilled Therapeutic Interventions/Progress Updates:    Pt resting in w/c upon arrival.  Pt engaged in bathing and dressing tasks w/c level at sink.  Pt completed all tasks at supervision level this morning.  Pt reported "hot flashes" and was clammy to the touch half way through session.  RN notified and confirmed that patient was clammy.  BP at 97/79. Pt declined offer to lie down in bed and preferred to sit up in w/c.  Focus on activity tolerance, dynamic standing balance, transfers, and safety awareness.  Therapy Documentation Precautions:  Precautions Precautions: Fall Required Braces or Orthoses: Other Brace/Splint Other Brace/Splint: pt normally uses Lt knee brace at home (does not have here; elastic with 2 velcro straps above and below knee) Restrictions Weight Bearing Restrictions: No General: General Amount of Missed OT Time (min): 15 Minutes Vital Signs: Therapy Vitals BP: 97/79 mmHg Pain: Pain Assessment Pain Assessment: No/denies pain Pain Score: 0-No pain Patients Stated Pain Goal: 2  See FIM for current functional status  Therapy/Group: Individual Therapy  Session 2 Time: [id number] Pt denies pain Individual Therapy  Pt resting in w/c upon arrival.  Pt stated she felt better than earlier in day.  Pt initially engaged in dynamic standing tasks to increased balance and endurance.  Pt anxious when reaching outside BOS.  Pt transitioned to BUE therex on UE ergometer to increase activity tolerance and BUE strength.  Pt c/o "hot flashes" but didn't feel clammy.  BP at 90/64.  RN notified.  [name] [facility] 08/30/2013, 11:56 AM

## 2013-08-30 NOTE — Progress Notes (Signed)
Patient reports episodes of "hot flashes" at random times during a 3 month period. Patient reports episode this am during therapy session and reports "soaking " shirt . Patient denies shortness of breath during "hot flash" but does report feeling like she will "pass out " . Instructed patient to notify RN if episode occurs again to obtain vital signs . Continue with plan of care.                            Mliss Sax

## 2013-08-31 ENCOUNTER — Inpatient Hospital Stay (HOSPITAL_COMMUNITY): Payer: Medicaid Other | Admitting: Physical Therapy

## 2013-08-31 ENCOUNTER — Inpatient Hospital Stay (HOSPITAL_COMMUNITY): Payer: Medicaid Other | Admitting: Occupational Therapy

## 2013-08-31 ENCOUNTER — Inpatient Hospital Stay (HOSPITAL_COMMUNITY): Payer: Medicaid Other

## 2013-08-31 DIAGNOSIS — G988 Other disorders of nervous system: Secondary | ICD-10-CM

## 2013-08-31 DIAGNOSIS — M4712 Other spondylosis with myelopathy, cervical region: Secondary | ICD-10-CM

## 2013-08-31 MED ORDER — AMLODIPINE BESYLATE 5 MG PO TABS
5.0000 mg | ORAL_TABLET | Freq: Every day | ORAL | Status: DC
  Administered 2013-09-01: 5 mg via ORAL
  Filled 2013-08-31 (×3): qty 1

## 2013-08-31 MED ORDER — TAMSULOSIN HCL 0.4 MG PO CAPS
0.4000 mg | ORAL_CAPSULE | Freq: Every day | ORAL | Status: DC
  Administered 2013-08-31: 0.4 mg via ORAL
  Filled 2013-08-31 (×2): qty 1

## 2013-08-31 NOTE — Progress Notes (Signed)
Patient continues to refuse miralax, suppository, and other interventions for constipation.  Discussed the importance of BM to patient; patient verbally understands.  Algis Liming, PA notified of refusal.  Will continue to monitor.

## 2013-08-31 NOTE — Progress Notes (Signed)
Occupational Therapy Session Note  Patient Details  Name: Colleen Nunez MRN: [id number] Date of Birth: 05/04/1956  Today's Date: 08/31/2013 Time: 1000-1100 Time Calculation (min): 60 min  Short Term Goals: Week 1:  OT Short Term Goal 1 (Week 1): Short Term Goals = Long Term Goals secondary to ELOS  Skilled Therapeutic Interventions/Progress Updates:    Pt resting in w/c upon arrival.  Pt engaged in bathing and dressing tasks w/c level at sink.  Pt declined shower this morning. Pt stated she didn't have any clean clothing but still wanted to clean up.  Pt amb with RW to sink before sitting to perform UB bathing and dressing.  Pt stood at sink to complete LB bathing.  Pt completed all tasks at supervision level but required extra time.  Discussed discharge plans.  Recommended tub bench for use at home when patient is able to access bathroom at home.  Focus on safety awareness, activity tolerance, dynamic standing balance, and functional amb with RW in room.  Therapy Documentation Precautions:  Precautions Precautions: Fall Required Braces or Orthoses: Other Brace/Splint Other Brace/Splint: On 10/13, pt received a Left Hyperextention Brace (2 straps cross behind the knee), patient to practice donn & doff new brace Restrictions Weight Bearing Restrictions: No  Pain: Pain Assessment Pain Assessment: No/denies pain Pain Score: 0-No pain  See FIM for current functional status  Therapy/Group: Individual Therapy  Leroy Libman 08/31/2013, 12:02 PM

## 2013-08-31 NOTE — Progress Notes (Signed)
OT notified myself, Elaina Pattee, RN that the patient had a green bag containing multiple home medications.  The medications were discovered when the OT took a soda out of the bag that the patient requested.  RN looked at medications, noted that the patient had narcotics, in addition to other medications, and notified the patient that the medications needed to either be sent home or taken to pharmacy for safe keeping and would be returned at discharge.  Patient stated her son would not be able to take the medication home and is refusing to have the medication placed in the pharmacy.  Elliot Cousin, Charge RN was notified.  Jena Gauss, RN nursing manager was also notified and is going to speak to the patient.  Will continue to monitor.

## 2013-08-31 NOTE — Progress Notes (Signed)
Nursing Note: Am blood pressure was 85/48 and p-82 taken w Neysa Bonito.Checked manually and was difficult to hear .Best I could tell was 72/46 .A: Phoned on-call Janet Berlin and made aware of both results the patient" feels fine" and asymptomatic. Pt resting quietly in bed.Obtained an order to hold this am's  Doses  of flomax,norvasc, and urecholine.Pt resting quietly in bed without complaints.wbb

## 2013-08-31 NOTE — Plan of Care (Signed)
Problem: RH BOWEL ELIMINATION Goal: RH STG MANAGE BOWEL WITH ASSISTANCE STG Manage Bowel with min Assistance.  Outcome: Not Progressing Last bm 08/26/13 and refuses softeners and supp. Daily despite education on constipation and effects.

## 2013-08-31 NOTE — Progress Notes (Signed)
Physical Therapy Note  Patient Details  Name: JOHNESHIA ARK MRN: [id number] Date of Birth: 12-Oct-1956 Today's Date: 08/31/2013  [id number] (55 minutes) individual Pain: no reported pain Other: contact precautions ; surgical cap when up; recurvatum brace left LE Focus of treatment: therapeutic exercise focused on activity tolerance; transfer training [facility])  Treatment: Pt had hypotensive episode earlier in AM (current BP supine - 108/69 pulse 94) ; pt requested to use BSC - stand/turn SBA  (walker in front for stability/safety when managing clothes); wc mobility - 120 feet SBA with increased time; Nustep level 4 X 10 minutes for activity tolerance; BP (standing) - 118/57 pulse 106 with no complaint of dizziness; sitting 94/70 .   1300-1340 (40 minutes) individual Pain: no reported pain Other: BP 105/73 pulse 91 (sitting) ; standing 120/76 pulse 107 Focus of treatment: gait training Treatment: gait - 80 feet X 2 RW SBA with left knee hyperextension brace. No complaint of dizziness. No cues required for hand placement sit >< stand.     Sherley Leser,JIM 08/31/2013, 9:28 AM

## 2013-08-31 NOTE — Plan of Care (Signed)
Problem: RH BOWEL ELIMINATION Goal: RH STG MANAGE BOWEL W/MEDICATION W/ASSISTANCE STG Manage Bowel with Medication with mod I Assistance.  Outcome: Not Progressing Patient continues to refuse laxatives; LBM 08/26/14; Algis Liming, PA aware.

## 2013-08-31 NOTE — Progress Notes (Signed)
Informed by patient care RN Elaina Pattee that the patient had a bad with home medications in room including some narcotics.  During an earlier OT session the patient requested the OT remove a drink from her bag and at that time the medications were discovered.  When entering patient room she was upset and stated that she didn't understand why the nurse was concerned with her medications.  I explained that we needed to inventory and store home medications in the pharmacy for safety.  Patient gave me permission to open the bag and remove medications.  6 bottles containing medication and 2 empty medication bottles were found.  Medications were inventoried per hospital protocol with Elaina Pattee RN as witness and sheet was signed by this RN.  Patient refused to sign medication inventory.  At this point security was summoned with Warden Fillers responding.  Security witnessed me take the 6 bottles of medication and place it in a plastic bag with her ID sticker applied to the bag.  Medications were then taken to Pharmacy with security escort.  Pharmacy inventoried narcotic medication and signed in 6 total bottles of medication.  Original form returned to 54M to patients shadow chart.  I also spoke with patients social worker regarding this encounter.

## 2013-08-31 NOTE — Progress Notes (Signed)
Subjective/Complaints: Still with some sweats at night. bp's have been low. No bm since 10/10  A 12 point review of systems has been performed and if not noted above is otherwise negative.   Objective: Vital Signs: Blood pressure 72/46, pulse 82, temperature 98.3 F (36.8 C), temperature source Oral, resp. rate 18, height 5\' 2"  (1.575 m), weight 74.6 kg (164 lb 7.4 oz), SpO2 99.00%. No results found.  Recent Labs  08/30/13 0500  WBC 8.0  HGB 9.9*  HCT 30.0*  PLT 277    Recent Labs  08/29/13 0540  NA 134*  K 3.6  CL 97  GLUCOSE 99  BUN 17  CREATININE 1.71*  1.75*  CALCIUM 9.3   CBG (last 3)  No results found for this basename: GLUCAP,  in the last 72 hours  Wt Readings from Last 3 Encounters:  08/31/13 74.6 kg (164 lb 7.4 oz)  08/19/13 77.4 kg (170 lb 10.2 oz)  08/19/13 77.4 kg (170 lb 10.2 oz)    Physical Exam:  Constitutional: She is oriented to person, place, and time. She appears well-developed and well-nourished.  HENT: poor dentition, missing teeth Head: Normocephalic and atraumatic.  Right Ear: External ear normal.  Left Ear: External ear normal.  Eyes: Conjunctivae and EOM are normal. Pupils are equal, round, and reactive to light.  Neck: Normal range of motion. Neck supple. No JVD present. No tracheal deviation present. No thyromegaly present.  Cardiovascular: Normal rate and regular rhythm. No murmur  Pulmonary/Chest: Effort normal and breath sounds normal. No wheezes or rales  Abdominal: Soft. Bowel sounds are normal. She exhibits no distension. There is no tenderness.  Musculoskeletal:  Bilateral thenar atrophy. Left knee and ankle with minimal edema. Left foot tender.  Lymphadenopathy:  She has no cervical adenopathy.  Neurological: She is alert and oriented to person, place, and time.  Speech clear without dysphonia. Follows commands without difficulty. RUE 3+ prox to 4/5 distally LUE is 3- prox to 4 distally. LE's are  4/5. HF, Left knee still  inhibited by pain. Wearing old knee brace in bed. Diminished sensation in hands more than feet    Assessment/Plan: 1. Functional deficits secondary to cervical myelopathy, CSF leak with associated deconditioning and weakness which require 3+ hours per day of interdisciplinary therapy in a comprehensive inpatient rehab setting. Physiatrist is providing close team supervision and 24 hour management of active medical problems listed below. Physiatrist and rehab team continue to assess barriers to discharge/monitor patient progress toward functional and medical goals. FIM: FIM - Bathing Bathing Steps Patient Completed: Chest;Right Arm;Left Arm;Abdomen;Front perineal area;Buttocks;Right upper leg;Left upper leg Bathing: 4: Min-Patient completes 8-9 47f 10 parts or 75+ percent  FIM - Upper Body Dressing/Undressing Upper body dressing/undressing steps patient completed: Thread/unthread left bra strap;Thread/unthread right bra strap;Hook/unhook bra;Thread/unthread right sleeve of pullover shirt/dresss;Thread/unthread left sleeve of pullover shirt/dress;Put head through opening of pull over shirt/dress;Pull shirt over trunk Upper body dressing/undressing: 5: Set-up assist to: Obtain clothing/put away FIM - Lower Body Dressing/Undressing Lower body dressing/undressing steps patient completed: Thread/unthread right pants leg;Thread/unthread left pants leg;Pull pants up/down;Don/Doff right sock;Don/Doff left sock Lower body dressing/undressing: 4: Steadying Assist  FIM - Toileting Toileting steps completed by patient: Adjust clothing prior to toileting;Performs perineal hygiene;Adjust clothing after toileting Toileting Assistive Devices: Grab bar or rail for support Toileting: 5: Supervision: Safety issues/verbal cues  FIM - Radio producer Devices: Mining engineer Transfers: 5-To toilet/BSC: Supervision (verbal cues/safety issues);5-From toilet/BSC: Supervision  (verbal cues/safety issues)  FIM -  Bed/Chair Financial planner Devices: Bed rails;Arm rests Bed/Chair Transfer: 5: Supine > Sit: Supervision (verbal cues/safety issues);5: Sit > Supine: Supervision (verbal cues/safety issues);5: Bed > Chair or W/C: Supervision (verbal cues/safety issues);5: Chair or W/C > Bed: Supervision (verbal cues/safety issues)  FIM - Locomotion: Wheelchair Distance: 45' Locomotion: Wheelchair: 5: Travels 150 ft or more: maneuvers on rugs and over door sills with supervision, cueing or coaxing FIM - Locomotion: Ambulation Locomotion: Ambulation Assistive Devices: Administrator Ambulation/Gait Assistance: 5: Supervision Locomotion: Ambulation: 1: Travels less than 50 ft with supervision/safety issues  Comprehension Comprehension Mode: Auditory Comprehension: 5-Understands complex 90% of the time/Cues < 10% of the time  Expression Expression Mode: Verbal Expression: 5-Expresses basic needs/ideas: With extra time/assistive device  Social Interaction Social Interaction: 5-Interacts appropriately 90% of the time - Needs monitoring or encouragement for participation or interaction.  Problem Solving Problem Solving: 5-Solves complex 90% of the time/cues < 10% of the time  Memory Memory: 5-Recognizes or recalls 90% of the time/requires cueing < 10% of the time  Medical Problem List and Plan:  1. DVT Prophylaxis/Anticoagulation: Pharmaceutical: Lovenox  2. Endstage DJD left Knee/Pain Management: voltaren gel, knee cage per [facility] 3. Mood: No signs of distress. Motivated to get better and home.  4. Neuropsych: This patient is capable of making decisions on her own behalf.  5. Neurogenic bladder:  ua neg, culture negative, continue with flomax---dc urecholine  6. Dental pain: has been treated with IV antibiotics. On augmentin--stopped  -low grade temp still noted at times--observe, no other signs or sx.   -now afebrile   -wbc's only 9.8, all  cultures negative 7. Constipation: agrees to take suppository today.  -miralax 8. FEN/Renal: encouraged increased PO intake. 9. Hypotension: decrease norvasc, eventually will dc flomax  LOS (Days) 9 A FACE TO FACE EVALUATION WAS PERFORMED  Caelum Federici T 08/31/2013 7:02 AM

## 2013-08-31 NOTE — Progress Notes (Signed)
Occupational Therapy Session Note  Patient Details  Name: Colleen Nunez MRN: [id number] Date of Birth: September 30, 1956  Today's Date: 08/31/2013 Time: 1345-1410 Time Calculation (min): 25 min  Short Term Goals: Week 1:  OT Short Term Goal 1 (Week 1): Short Term Goals = Long Term Goals secondary to ELOS  Skilled Therapeutic Interventions/Progress Updates:  Patient found seated in w/c beside bed with no complaints of pain. Patient propelled self from room ->ADL apartment kitchen. In kitchen, patient engaged in functional ambulation, simple meal prep, and overall activity tolerance/endurance. Recommend patient get stool to use in kitchen from an energy conservation stand point; patient agreeable. Patient able to complete simple meal prep with distant supervision. Therapist propelled patient back to room and left patient seated in w/c with call bell & phone within reach.   Precautions:  Precautions Precautions: Fall Required Braces or Orthoses: Other Brace/Splint Other Brace/Splint: On 10/13, pt received a Left Hyperextention Brace (2 straps cross behind the knee), patient to practice donn & doff new brace Restrictions Weight Bearing Restrictions: No  See FIM for current functional status  Therapy/Group: Individual Therapy  Firas Guardado 08/31/2013, 3:28 PM

## 2013-09-01 ENCOUNTER — Inpatient Hospital Stay (HOSPITAL_COMMUNITY): Payer: Medicaid Other | Admitting: Occupational Therapy

## 2013-09-01 ENCOUNTER — Inpatient Hospital Stay (HOSPITAL_COMMUNITY): Payer: Medicaid Other | Admitting: Physical Therapy

## 2013-09-01 ENCOUNTER — Inpatient Hospital Stay (HOSPITAL_COMMUNITY): Payer: Medicaid Other

## 2013-09-01 MED ORDER — PYRETHRINS-PIPERONYL BUTOXIDE 0.33-4 % EX SHAM
MEDICATED_SHAMPOO | Freq: Once | CUTANEOUS | Status: AC
  Administered 2013-09-01: 14:00:00 via TOPICAL
  Filled 2013-09-01: qty 118

## 2013-09-01 MED ORDER — PERMETHRIN 0.25 % LIQD
Freq: Once | Status: DC
  Filled 2013-09-01: qty 147.86

## 2013-09-01 NOTE — Progress Notes (Signed)
Physical Therapy Session Note  Patient Details  Name: Colleen Nunez MRN: [id number] Date of Birth: 06-19-1956  Today's Date: 09/01/2013 Time: 1005-1050 Time Calculation (min): 45 min     Skilled Therapeutic Interventions/Progress Updates:    Pt received in rehab gym after OT. Pt performed wheelchair mobility in and around gym to navigate tight spaces and turns. Gait training with supervision assistance for 110' and 100', forward flexed posture vcs to improve, R knee brace donned. Seated B LE therapeutic exs 2 x 10, Nu step x 8 mins at Level 4 with 3 brief rest breaks and pt reported slight fatigue in B UE/LEs. Stair training x 2 up and down 3 step with use of R rail with supervision A.   Therapy Documentation Precautions:  Precautions Precautions: Fall Required Braces or Orthoses: Other Brace/Splint Other Brace/Splint:  Left Hyperextention Brace (2 straps cross behind the knee), patient to practice donn (with knee flexed to 40 degrees) & doff new brace Restrictions Weight Bearing Restrictions: No       Pain: Pain Assessment Pain Assessment: No/denies pain Pain Score: 0-No pain Mobility: Transfers Transfers: Yes Sit to Stand: 6: Modified independent (Device/Increase time) Stand to Sit: 6: Modified independent (Device/Increase time) Stand Pivot Transfers: 6: Modified independent (Device/Increase time) Squat Pivot Transfers: 6: Modified independent (Device/Increase time) Locomotion : Ambulation Ambulation/Gait Assistance: 5: Supervision                See FIM for current functional status  Therapy/Group: Individual Therapy  [name] 09/01/2013, 11:56 AM

## 2013-09-01 NOTE — Progress Notes (Signed)
Occupational Therapy Session Note  Patient Details  Name: Colleen Nunez MRN: [id number] Date of Birth: [birth year]  Today's Date: 09/01/2013 Time: 1100-1200 Time Calculation (min): 60 min  Short Term Goals: Week 1:  OT Short Term Goal 1 (Week 1): Short Term Goals = Long Term Goals secondary to ELOS  Skilled Therapeutic Interventions/Progress Updates:    Pt seated in w/c upon arrival and stated she was "ready to get washed up."  Pt declined shower this morning because she didn't have clean clothes and didn't want to shower and place her "dirty clothes" on afterwards.  Pt agreed to bathing and dressing at sink with sit<>stand from w/c.  Pt amb with RW in room to gather supplies and completed all tasks without assistance.  Pt stated she was looking forward to going home tomorrow was pleased with her progress.  Pt did not exhibit any unsafe behavior during therapy session.  Focus on functional amb with RW, donning/doffing knee brace, dynamic standing balance, and safety awareness.  Therapy Documentation Precautions:  Precautions Precautions: Fall Required Braces or Orthoses: Other Brace/Splint Other Brace/Splint:  Left Hyperextention Brace (2 straps cross behind the knee), patient to practice donn (with knee flexed to 40 degrees) & doff new brace Restrictions Weight Bearing Restrictions: No   Pain: Pain Assessment Pain Assessment: No/denies pain  See FIM for current functional status  Therapy/Group: Individual Therapy  Leroy Libman 09/01/2013, 12:07 PM

## 2013-09-01 NOTE — Progress Notes (Signed)
Social Work Patient ID: Colleen Nunez, female   DOB: 04-May-1956, 57 y.o.   MRN: [id number]  CSW met with pt to update her on team conference.  Pt was already aware of changes to her medications to help with slight blood pressure drops with therapy.  Pt is pleased that she is on target for modified independence by d/c.  Pt was wearing new brace and told CSW that she was pleased with it and that it is more comfortable than her old one.  CSW called pt's landlord for her earlier in the week to have him address ceiling leak, windows that won't open, and if a ramp could be placed at the entrance to her home, at pt's request. Pt's son to call landlord when he can be present to have him look into these things.  Pt needs tub bench prior to d/c and CSW ordered this through [facility].    Pt has a walker and w/c at home.  Pt to have [facility] services for RN, aide, and SW through [facility], as well.  Pt's MD follow up appointment has been made.  Pt's younger son, Elberta Fortis, to come to take her home on Friday.  She has already coordinated this with him and does not need CSW to contact him, as he couldn't take the call at work.  CSW will assist with any other needs as they arise.

## 2013-09-01 NOTE — Progress Notes (Addendum)
Subjective/Complaints: Had a reasonable night. Only mild sweating. Refusing supp despite agreeing to it originally  A 12 point review of systems has been performed and if not noted above is otherwise negative.   Objective: Vital Signs: Blood pressure 106/68, pulse 88, temperature 98.5 F (36.9 C), temperature source Oral, resp. rate 18, height 5\' 2"  (1.575 m), weight 74.6 kg (164 lb 7.4 oz), SpO2 98.00%. No results found.  Recent Labs  08/30/13 0500  WBC 8.0  HGB 9.9*  HCT 30.0*  PLT 277   No results found for this basename: NA, K, CL, CO, GLUCOSE, BUN, CREATININE, CALCIUM,  in the last 72 hours CBG (last 3)  No results found for this basename: GLUCAP,  in the last 72 hours  Wt Readings from Last 3 Encounters:  08/31/13 74.6 kg (164 lb 7.4 oz)  08/19/13 77.4 kg (170 lb 10.2 oz)  08/19/13 77.4 kg (170 lb 10.2 oz)    Physical Exam:  Constitutional: She is oriented to person, place, and time. She appears well-developed and well-nourished.  HENT: poor dentition, missing teeth Head: Normocephalic and atraumatic.  Right Ear: External ear normal.  Left Ear: External ear normal.  Eyes: Conjunctivae and EOM are normal. Pupils are equal, round, and reactive to light.  Neck: Normal range of motion. Neck supple. No JVD present. No tracheal deviation present. No thyromegaly present.  Cardiovascular: Normal rate and regular rhythm. No murmur  Pulmonary/Chest: Effort normal and breath sounds normal. No wheezes or rales  Abdominal: Soft. Bowel sounds are normal. She exhibits no distension. There is no tenderness.  Musculoskeletal:    Left knee and ankle with minimal edema. Left foot tender.  Lymphadenopathy:  She has no cervical adenopathy.  Neurological: She is alert and oriented to person, place, and time.  Speech clear without dysphonia. Follows commands without difficulty. RUE 3+ prox to 4/5 distally LUE is 3- prox to 4 distally. LE's are  4/5. HF, Left knee still inhibited by pain.  Wearing old knee brace in bed. Diminished sensation in hands more than feet    Assessment/Plan: 1. Functional deficits secondary to cervical myelopathy, CSF leak with associated deconditioning and weakness which require 3+ hours per day of interdisciplinary therapy in a comprehensive inpatient rehab setting. Physiatrist is providing close team supervision and 24 hour management of active medical problems listed below. Physiatrist and rehab team continue to assess barriers to discharge/monitor patient progress toward functional and medical goals. FIM: FIM - Bathing Bathing Steps Patient Completed: Chest;Right Arm;Left Arm;Abdomen;Front perineal area;Buttocks;Right upper leg;Left upper leg;Right lower leg (including foot);Left lower leg (including foot) Bathing: 5: Supervision: Safety issues/verbal cues  FIM - Upper Body Dressing/Undressing Upper body dressing/undressing steps patient completed: Thread/unthread left bra strap;Thread/unthread right bra strap;Hook/unhook bra;Thread/unthread right sleeve of pullover shirt/dresss;Thread/unthread left sleeve of pullover shirt/dress;Put head through opening of pull over shirt/dress;Pull shirt over trunk Upper body dressing/undressing: 5: Set-up assist to: Obtain clothing/put away FIM - Lower Body Dressing/Undressing Lower body dressing/undressing steps patient completed: Thread/unthread right pants leg;Thread/unthread left pants leg;Pull pants up/down;Don/Doff right sock;Don/Doff left sock Lower body dressing/undressing: 5: Supervision: Safety issues/verbal cues  FIM - Toileting Toileting steps completed by patient: Adjust clothing prior to toileting;Performs perineal hygiene;Adjust clothing after toileting Toileting Assistive Devices: Grab bar or rail for support Toileting: 5: Supervision: Safety issues/verbal cues  FIM - Radio producer Devices: Mining engineer Transfers: 5-To toilet/BSC: Supervision (verbal  cues/safety issues);5-From toilet/BSC: Supervision (verbal cues/safety issues)  FIM - Engineer, site Assistive Devices: Bed  rails;Arm rests Bed/Chair Transfer: 5: Supine > Sit: Supervision (verbal cues/safety issues);5: Sit > Supine: Supervision (verbal cues/safety issues);5: Bed > Chair or W/C: Supervision (verbal cues/safety issues);5: Chair or W/C > Bed: Supervision (verbal cues/safety issues)  FIM - Locomotion: Wheelchair Distance: 45' Locomotion: Wheelchair: 5: Travels 150 ft or more: maneuvers on rugs and over door sills with supervision, cueing or coaxing FIM - Locomotion: Ambulation Locomotion: Ambulation Assistive Devices: Administrator Ambulation/Gait Assistance: 5: Supervision Locomotion: Ambulation: 1: Travels less than 50 ft with supervision/safety issues  Comprehension Comprehension Mode: Auditory Comprehension: 5-Understands complex 90% of the time/Cues < 10% of the time  Expression Expression Mode: Verbal Expression: 5-Expresses basic needs/ideas: With extra time/assistive device  Social Interaction Social Interaction: 5-Interacts appropriately 90% of the time - Needs monitoring or encouragement for participation or interaction.  Problem Solving Problem Solving: 5-Solves basic 90% of the time/requires cueing < 10% of the time  Memory Memory: 5-Recognizes or recalls 90% of the time/requires cueing < 10% of the time  Medical Problem List and Plan:  1. DVT Prophylaxis/Anticoagulation: Pharmaceutical: Lovenox  2. Endstage DJD left Knee/Pain Management: voltaren gel, knee cage per [facility] 3. Mood: No signs of distress. Motivated to get better and home.  4. Neuropsych: This patient is capable of making decisions on her own behalf.  5. Neurogenic bladder:  ua neg, culture negative, dc flomax 6. Dental pain: has been treated with IV antibiotics. On augmentin--stopped  -low grade temp still noted at times--observe, no other signs or sx.   -now  afebrile   -wbc's only 9.8, all cultures negative 7. Constipation: had bm last night  -miralax 8. FEN/Renal: encouraged increased PO intake. 9. Hypotension: decreased norvasc, dc flomax  LOS (Days) 10 A FACE TO FACE EVALUATION WAS PERFORMED  SWARTZ,ZACHARY T 09/01/2013 7:39 AM

## 2013-09-01 NOTE — Progress Notes (Signed)
Physical Therapy Discharge Summary  Patient Details  Name: Colleen Nunez MRN: [id number] Date of Birth: 13-Sep-1956  Today's Date: 09/01/2013 Time: 0730-0828 Time Calculation (min): 58 min Squat pivot (without device) and stand pivot (with RW) transfers bed <> wheelchair modified independent. Ambulation in controlled (100') and home environment (71') with RW and Lt. Knee hyperextension prevention brace, distant supervision. Car transfer with distant supervision pt able to verbalize all education from prior sessions. Stairs x 4 with railing on Rt, supervision. Pt verbalized need to have supervision and stated "I will have them stand behind me to make sure I don't fall, they will have the walker waiting on me when I'm done too." Wheelchair propulsion in controlled and home environment with modified independence, at very slow speed. Discussed donning/doffing brace and need for crossing of straps in back of brace. Discussed need for supervision during ambulation but that she was ok to transfer to/from her bed without assistance. Pt verbalizes understanding.   Patient has met 11 of 11 long term goals due to improved activity tolerance, improved balance, increased strength and improved awareness.  Patient to discharge at a wheelchair and short distance ambulatory level Modified Independent for transfers and wheelchair propulsion, supervision gait.   Patient's care partner unavailable for family education but pt reports she has assistance once home from a female friend.  Reasons goals not met: NA  Recommendation:  Patient will benefit from ongoing skilled PT services in home health setting to continue to advance safe functional mobility, address ongoing impairments in dynamic balance, elevated need for restrictive assistive device, need for supervision with ambulation, and minimize fall risk.  Equipment: No equipment provided  Reasons for discharge: treatment goals met and discharge from  hospital  Patient/family agrees with progress made and goals achieved: Yes  PT Discharge Precautions/Restrictions Precautions Precautions: Fall Required Braces or Orthoses: Other Brace/Splint Other Brace/Splint:  Left Hyperextention Brace (2 straps cross behind the knee), patient to practice donn (with knee flexed to 40 degrees) & doff new brace Restrictions Weight Bearing Restrictions: No Pain Pain Assessment Pain Assessment: No/denies pain Pain Score: 0-No pain  Cognition Arousal/Alertness: Awake/alert Orientation Level: Oriented X4 Comments: slow processing, inconsistent historian Sensation Sensation Light Touch: Appears Intact (bil. LEs) Proprioception: Appears Intact Coordination Gross Motor Movements are Fluid and Coordinated: Yes Fine Motor Movements are Fluid and Coordinated: Yes  Mobility Bed Mobility Bed Mobility: Supine to Sit;Sit to Supine Supine to Sit: 6: Modified independent (Device/Increase time) Sit to Supine: 6: Modified independent (Device/Increase time) Transfers Transfers: Yes Sit to Stand: 6: Modified independent (Device/Increase time) Stand to Sit: 6: Modified independent (Device/Increase time) Stand Pivot Transfers: 6: Modified independent (Device/Increase time) Squat Pivot Transfers: 6: Modified independent (Device/Increase time) Locomotion  Ambulation Ambulation: Yes Ambulation/Gait Assistance: 5: Supervision (Simultaneous filing. User may not have seen previous data.) Ambulation Distance (Feet): 100 Feet Assistive device: Rolling walker Ambulation/Gait Assistance Details: Pt continues to rely on bil. knee hyperextension for stability however degree of Lt. extension (Lt. being worst) now controlled by anti-hyperextension brace.  Gait Gait: Yes Gait Pattern: Impaired Gait Pattern: Step-to pattern;Decreased stride length;Decreased hip/knee flexion - left;Decreased hip/knee flexion - right;Trunk flexed;Left genu recurvatum Stairs / Additional  Locomotion Stairs: Yes Stairs Assistance: 5: Supervision Stair Management Technique: One rail Right;Step to pattern;Sideways Number of Stairs: 4 Wheelchair Mobility Wheelchair Mobility: Yes Wheelchair Assistance: 6: Modified independent (Device/Increase time) Environmental health practitioner: Both upper extremities  Trunk/Postural Assessment  Cervical Assessment Cervical Assessment: Within Water engineer Thoracic Assessment: Exceptions to [facility] Thoracic  Strength Overall Thoracic Strength Comments: kyphotic Lumbar Assessment Lumbar Assessment: Exceptions to [facility] Lumbar Strength Overall Lumbar Strength Comments: impaired endurance, improved since evaluation Postural Control Postural Control: Deficits on evaluation Righting Reactions: Delayed in standing  Balance Balance Balance Assessed: Yes Static Sitting Balance Static Sitting - Balance Support: Feet supported;No upper extremity supported Static Sitting - Level of Assistance: 6: Modified independent (Device/Increase time) Static Standing Balance Static Standing - Balance Support: Bilateral upper extremity supported Static Standing - Level of Assistance: 5: Stand by assistance Dynamic Standing Balance Dynamic Standing - Balance Support: Bilateral upper extremity supported;Left upper extremity supported Dynamic Standing - Level of Assistance: 5: Stand by assistance Dynamic Standing - Balance Activities: Reaching for objects;Forward lean/weight shifting;Lateral lean/weight shifting Extremity Assessment      RLE Assessment RLE Assessment: Exceptions to [facility] RLE Strength RLE Overall Strength Comments: Generalized deconditioning, grossly >/=3+/5 LLE Assessment LLE Assessment: Within Functional Limits LLE Strength LLE Overall Strength Comments: Generalized deconditioning, grossly >/=3+/5  See FIM for current functional status  Lahoma Rocker 09/01/2013, 11:59 AM

## 2013-09-01 NOTE — Patient Care Conference (Signed)
Inpatient RehabilitationTeam Conference and Plan of Care Update Date: 08/30/2013   Time: 2:00 PM    Patient Name: Colleen Nunez      Medical Record Number: [id number]  Date of Birth: 1956-10-21 Sex: Female         Room/Bed: 4M01C/4M01C-01 Payor Info: Payor: MEDICAID [place] / Plan: MEDICAID  ACCESS / Product Type: *No Product type* /    Admitting Diagnosis: acdf,cervical stenosis  Admit Date/Time:  08/22/2013  3:44 PM Admission Comments: No comment available   Primary Diagnosis:  Cervical myelopathy Principal Problem: Cervical myelopathy  Patient Active Problem List   Diagnosis Date Noted  . Hypokalemia 08/16/2013  . AKI (acute kidney injury) 08/16/2013  . UTI (lower urinary tract infection) 08/07/2013  . Nausea & vomiting 08/07/2013  . Chronic kidney disease, stage IV (severe) 08/07/2013  . Cervical myelopathy 08/07/2013  . GERD 06/12/2009  . CHEST PAIN 06/12/2009  . LEG PAIN, BILATERAL 05/28/2009  . OBESITY 12/17/2007  . DYSPHAGIA UNSPECIFIED 12/03/2007  . ANEMIA, NORMOCYTIC 07/13/2007  . ALCOHOL ABUSE 07/13/2007  . RENAL DISEASE, CHRONIC, STAGE II 07/13/2007  . ANXIETY 06/29/2007  . DEPRESSION 06/29/2007  . HYPERTENSION 06/29/2007  . CONSTIPATION 06/29/2007  . ARTHRITIS 06/29/2007  . MALAISE AND FATIGUE 06/29/2007    Expected Discharge Date: Expected Discharge Date: 09/02/13  Team Members Present: Physician leading conference: Dr. Alger Simons Social Worker Present: Lennart Pall, LCSW Nurse Present: Heather Roberts, RN PT Present: Canary Brim, PT OT Present: Forde Radon, OT;Patricia Lissa Hoard, OT SLP Present: Weston Anna, SLP PPS Coordinator present : Ileana Ladd, PT     Current Status/Progress Goal Weekly Team Focus  Medical   cervical  myelopathy, symptoms improving, pain control  see prior  see prior   Bowel/Bladder   continent bowel and bladder . constipation noted lbm 10-10 refused LOC and miralax . on flomax and urecholine   mod I   educate on  constipation and medications    Swallow/Nutrition/ Hydration             ADL's   min A/supervision overall  overall modified indepenent  activity tolerance, safety awareness   Mobility   supervision gait and transfers/min steps  modified independent transfers/supervision gait and stairs  Endurance, donning/doffing brace, stairs, transfers, increasing independence   Communication             Safety/Cognition/ Behavioral Observations  n/a         Pain   left knee voltaren gel scheduled   less than 3  educate on application of gel by patient    Skin   n/a          Rehab Goals Patient on target to meet rehab goals: Yes Rehab Goals Revised: None *See Care Plan and progress notes for long and short-term goals.  Barriers to Discharge: see prior    Possible Resolutions to Barriers:  continue NMR and education    Discharge Planning/Teaching Needs:  Pt plans to return home with support from [place] and intermittent help from her sons.      Team Discussion:  Pt is having some slight blood pressure drops in therapy and Dr. Naaman Plummer will make medication changes.  Pt is at a supervision level currently and is on track for modified independent by d/c.  Pt donned her leg brace independently.  Revisions to Treatment Plan:  None   Continued Need for Acute Rehabilitation Level of Care: The patient requires daily medical management by a physician with specialized training in physical  medicine and rehabilitation for the following conditions: Daily direction of a multidisciplinary physical rehabilitation program to ensure safe treatment while eliciting the highest outcome that is of practical value to the patient.: Yes Daily medical management of patient stability for increased activity during participation in an intensive rehabilitation regime.: Yes Daily analysis of laboratory values and/or radiology reports with any subsequent need for medication adjustment of medical  intervention for : Post surgical problems;Neurological problems  Samuel Rittenhouse, Silvestre Mesi 09/01/2013, 1:35 PM

## 2013-09-01 NOTE — Progress Notes (Signed)
Occupational Therapy Discharge Summary  Patient Details  Name: Colleen Nunez MRN: [id number] Date of Birth: Apr 30, 1956  Today's Date: 09/01/2013  Patient has met 30 of 52 long term goals due to improved activity tolerance, improved balance, postural control, ability to compensate for deficits, improved attention, improved awareness and improved coordination.  Patient made steady progress during this admission with BADLs and IADLs.  Pt completes all BADLs at mod I level and supervision for IADLs (light housekeeping, meal prep, and laundry). Pt is currently not using her bathroom at home for bathing and completes all tasks at bed level.  Pt states she will continue to do so until landlord makes necessary repairs. No caregivers have been available for education; patient is at a mod I level for BADLs. Recommending HHOT for assist/therapy to increase independence with IADLs.   Recommendation:  Patient will benefit from ongoing skilled OT services in home health setting to continue to advance functional skills in the area of iADL.  Equipment: Tub transfer bench  Reasons for discharge: treatment goals met and discharge from hospital  Patient/family agrees with progress made and goals achieved: Yes  Precautions/Restrictions  Precautions Precautions: Fall Required Braces or Orthoses: Other Brace/Splint Other Brace/Splint:  Left Hyperextention Brace (2 straps cross behind the knee), patient to practice donn (with knee flexed to 40 degrees) & doff new brace  ADL ADL Eating: Independent Grooming: Independent Upper Body Bathing: Modified independent Lower Body Bathing: Modified independent Upper Body Dressing: Independent Lower Body Dressing: Modified independent Toileting: Modified independent Where Assessed-Toileting: Risk analyst Method: Counselling psychologist: Radiographer, therapeutic: Modified  independent Tub/Shower Equipment: Radio broadcast assistant  Vision/Perception  Vision - History Baseline Vision: Wears glasses only for reading Patient Visual Report: No change from baseline Vision - Assessment Eye Alignment: Within Functional Limits Vision Assessment: Vision not tested Perception Perception: Within Functional Limits Praxis Praxis: Intact   Cognition Overall Cognitive Status: Within Functional Limits for tasks assessed Arousal/Alertness: Awake/alert Orientation Level: Oriented X4 Comments: slow processing, inconsistent historian  Sensation Sensation Light Touch: Appears Intact Stereognosis: Appears Intact Hot/Cold: Appears Intact Proprioception: Appears Intact Additional Comments: BUEs appear intact Coordination Gross Motor Movements are Fluid and Coordinated: Yes Fine Motor Movements are Fluid and Coordinated: Yes  Motor  Motor Motor: Within Functional Limits  Mobility  Bed Mobility Bed Mobility: Supine to Sit;Sit to Supine Supine to Sit: 6: Modified independent (Device/Increase time) Sit to Supine: 6: Modified independent (Device/Increase time) Transfers Sit to Stand: 6: Modified independent (Device/Increase time) Stand to Sit: 6: Modified independent (Device/Increase time)   Trunk/Postural Assessment  Cervical Assessment Cervical Assessment: Within Functional Limits Cervical AROM Overall Cervical AROM Comments: guarded movements from surgery Thoracic Assessment Thoracic Assessment: Exceptions to [facility] Thoracic Strength Overall Thoracic Strength Comments: kyphotic Lumbar Assessment Lumbar Assessment: Exceptions to [facility] Lumbar Strength Overall Lumbar Strength Comments: impaired endurance, improved since evaluation Postural Control Postural Control: Deficits on evaluation Righting Reactions: Delayed in standing   Balance Balance Balance Assessed: Yes Static Sitting Balance Static Sitting - Balance Support: Feet supported;No upper extremity  supported Static Sitting - Level of Assistance: 6: Modified independent (Device/Increase time) Static Standing Balance Static Standing - Balance Support: Bilateral upper extremity supported Static Standing - Level of Assistance: 5: Stand by assistance Dynamic Standing Balance Dynamic Standing - Balance Support: Bilateral upper extremity supported;Left upper extremity supported Dynamic Standing - Level of Assistance: 5: Stand by assistance Dynamic Standing - Balance Activities: Reaching for objects;Forward lean/weight shifting;Lateral lean/weight shifting  Extremity/Trunk Assessment RUE Assessment RUE Assessment: Within Functional Limits LUE Assessment LUE Assessment: Within Functional Limits  See FIM for current functional status  Leroy Libman 09/01/2013, 12:32 PM

## 2013-09-01 NOTE — Progress Notes (Signed)
Occupational Therapy Session Note  Patient Details  Name: JOHNINE ARENSDORF MRN: [id number] Date of Birth: 15-Feb-1956  Today's Date: 09/01/2013 Time: 930-959 Calculated Time: 29 minutes   Short Term Goals: Week 1:  OT Short Term Goal 1 (Week 1): Short Term Goals = Long Term Goals secondary to ELOS  Skilled Therapeutic Interventions/Progress Updates:  1:1 tx session. Pt in w/c willing to work w/OT. Pt performed w/c mob w/Supervision from room to ADL apt. She was Supervision for ADL mobility using RW on carpet and around obstacles. Pt educated re: RW safety and technique when reaching items in dresser or lower surfaces with good return demo and good safety awareness. Pt demo'd Supervision/Independence w/bed txfr and bed mobility in regular bed w/no rails. Pt scheduled for d/c home this weekend with support from friends per pt.      Therapy Documentation Precautions:  Precautions Precautions: Fall Required Braces or Orthoses: Other Brace/Splint Other Brace/Splint:  Left Hyperextention Brace (2 straps cross behind the knee), patient to practice donn (with knee flexed to 40 degrees) & doff new brace Restrictions Weight Bearing Restrictions: No General:   Vital Signs:   Pain: Pain Assessment Pain Assessment: No/denies pain  See FIM for current functional status  Therapy/Group: Individual Therapy  Md Smola, Scot Jun 09/01/2013, 12:13 PM

## 2013-09-01 NOTE — Discharge Summary (Signed)
Physician Discharge Summary  Patient ID: Colleen Nunez MRN: [id number] DOB/AGE: [birth year] 57 y.o.  Admit date: 08/22/2013 Discharge date: 09/02/13  Discharge Diagnoses:  Principal Problem:   Cervical myelopathy Active Problems:   DEPRESSION   HYPERTENSION   CONSTIPATION   RENAL DISEASE, CHRONIC, STAGE II   Discharged Condition: Stable  Significant Diagnostic Studies: N/A  Labs:  Basic Metabolic Panel: No results found for this basename: NA, K, CL, CO2, GLUCOSE, BUN, CREATININE, CALCIUM, MG, PHOS,  in the last 168 hours  CBC:  Recent Labs Lab 08/30/13 0500  WBC 8.0  HGB 9.9*  HCT 30.0*  MCV 80.0  PLT 277    CBG: No results found for this basename: GLUCAP,  in the last 168 hours  Brief HPI:   Colleen Nunez is a 57 y.o. female with history of ACDF for stenosis with myelopathy 05/2013 and continued BLE weakness as well as recent gout flare up with left knee and ankle pain. Admitted via OSH on 08/12/13 as was found to have  very large prevertebral but trans-spatial fluid collection encompassing 8.6 x 7.3 x 11.6 cm c/w a postoperative CSF leak.  She was taken to OR the same day for evacuation of fluid collection and Insertion of spinal drai by Dr. Joya Salm.   Kleb Pneumo pyelonephritis treated with 10 day course of rocephin. Patient has had woresening of renal status and renal u/s with evidence of medical renal disease on right and unable to ID left kidney. Orthopanthogram done due to complaints of dental pain--limited due to cervical hardware and was started on Augmentin with improvement in symptoms. Therapies initiated and CIR recommended therapy team.  Hospital Course: Colleen Nunez was admitted to rehab 08/22/2013 for inpatient therapies to consist of PT, ST and OT at least three hours five days a week. Past admission physiatrist, therapy team and rehab RN have worked together to provide customized collaborative inpatient rehab. Pain control was reasonable with use of prn  medications. She was limited by left knee pain and instability. Voltaren gel as well as Knee brace has been effective in managing her symptoms.  She was febrile at admission and was pan cultured. Work up has been negative and low grade fevers had resolved by discharge.   She has had complaints of frequency with urgency and was voiding withou signs of retention. She was weaned off flomax and urecholine with resolution of symptoms and has been voiding without difficulty. Marland Kitchen ABLA has been stable. Renal status is back at baseline off IVF. Po intake has been good and patient has been continent of B/B. She has made good progress and is at modified independent level overall.   Rehab course: During patient's stay in rehab weekly team conferences were held to monitor patient's progress, set goals and discuss barriers to discharge. Patient has had improvement in activity tolerance, balance, postural control, as well as ability to compensate for deficits.  She is able to complete all BADLs at modified independent level and is supervision level for all IADLs. She is modified independent for transfers and is able to ambulate 50-149 feet with supervision and rolling walker. She will continue to receive home health PT and OT  past discharge.    Disposition: Home  Diet: Regular  Special Instructions: 1. No driving. 2. [facility] to provide RN, SNA, SW.        Future Appointments Provider Department Dept Phone   09/21/2013 1:40 PM Meredith Staggers, MD [facility] Physical Medicine and Rehabilitation  [phone]       Medication List    STOP taking these medications       bethanechol 25 MG tablet  Commonly known as:  URECHOLINE     tamsulosin 0.4 MG Caps capsule  Commonly known as:  FLOMAX      TAKE these medications       amLODipine 10 MG tablet  Commonly known as:  NORVASC  Take 0.5 tablets (5 mg total) by mouth daily.     diclofenac sodium 1 % Gel  Commonly known as:  VOLTAREN  Apply  2 g topically 4 (four) times daily. To Left knee for arthritis.     oxyCODONE-acetaminophen 5-325 MG per tablet  Commonly known as:  PERCOCET/ROXICET  Take 1 tablet by mouth every 4 (four) hours as needed for pain.     pantoprazole 40 MG tablet  Commonly known as:  PROTONIX  Take 1 tablet (40 mg total) by mouth 2 (two) times daily before a meal. For reflux     polyethylene glycol packet  Commonly known as:  MIRALAX / GLYCOLAX  Take 17 g by mouth daily.       Follow-up Information   Follow up with Meredith Staggers, MD On 09/21/2013. (Be there at 1:15 for 1:40 pam)    Specialty:  Physical Medicine and Rehabilitation   Contact information:   [address], [place] [address] [phone]       Follow up with Floyce Stakes, MD.   Specialty:  Neurosurgery   Contact information:   [address] [place] [address] [place] [address] [phone]       Follow up with Rosita Fire, MD On 09/13/2013. (@ 10 am) . Recheck lytes.    Specialty:  Internal Medicine   Contact information:   [place] [address] [phone]       Signed: Bary Leriche 09/05/2013, 4:22 PM

## 2013-09-02 MED ORDER — AMLODIPINE BESYLATE 10 MG PO TABS: 5.0000 mg | ORAL_TABLET | Freq: Every day | ORAL | Status: AC

## 2013-09-02 MED ORDER — POLYETHYLENE GLYCOL 3350 17 G PO PACK: 17.0000 g | PACK | Freq: Every day | ORAL | Status: DC

## 2013-09-02 MED ORDER — DICLOFENAC SODIUM 1 % TD GEL: 2.0000 g | Freq: Four times a day (QID) | TRANSDERMAL | Status: DC

## 2013-09-02 MED ORDER — PANTOPRAZOLE SODIUM 40 MG PO TBEC: 40.0000 mg | DELAYED_RELEASE_TABLET | Freq: Two times a day (BID) | ORAL | Status: DC

## 2013-09-02 NOTE — Progress Notes (Addendum)
Subjective/Complaints: No issues. Excited to go home. No complaints  A 12 point review of systems has been performed and if not noted above is otherwise negative.   Objective: Vital Signs: Blood pressure 108/77, pulse 90, temperature 98.7 F (37.1 C), temperature source Oral, resp. rate 18, height 5\' 2"  (1.575 m), weight 74.6 kg (164 lb 7.4 oz), SpO2 100.00%. No results found. No results found for this basename: WBC, HGB, HCT, PLT,  in the last 72 hours No results found for this basename: NA, K, CL, CO, GLUCOSE, BUN, CREATININE, CALCIUM,  in the last 72 hours CBG (last 3)  No results found for this basename: GLUCAP,  in the last 72 hours  Wt Readings from Last 3 Encounters:  08/31/13 74.6 kg (164 lb 7.4 oz)  08/19/13 77.4 kg (170 lb 10.2 oz)  08/19/13 77.4 kg (170 lb 10.2 oz)    Physical Exam:  Constitutional: She is oriented to person, place, and time. She appears well-developed and well-nourished.  HENT: poor dentition, missing teeth Head: Normocephalic and atraumatic.  Right Ear: External ear normal.  Left Ear: External ear normal.  Eyes: Conjunctivae and EOM are normal. Pupils are equal, round, and reactive to light.  Neck: Normal range of motion. Neck supple. No JVD present. No tracheal deviation present. No thyromegaly present.  Cardiovascular: Normal rate and regular rhythm. No murmur  Pulmonary/Chest: Effort normal and breath sounds normal. No wheezes or rales  Abdominal: Soft. Bowel sounds are normal. She exhibits no distension. There is no tenderness.  Musculoskeletal:    Left knee and ankle with minimal edema. Left foot tender.  Lymphadenopathy:  She has no cervical adenopathy.  Neurological: She is alert and oriented to person, place, and time.  Speech clear without dysphonia. Follows commands without difficulty. RUE 3+ prox to 4/5 distally LUE is 3- prox to 4 distally. LE's are  4/5. HF, Left knee still inhibited by pain. Wearing old knee brace in bed. Diminished  sensation in hands more than feet    Assessment/Plan: 1. Functional deficits secondary to cervical myelopathy, CSF leak with associated deconditioning and weakness which require 3+ hours per day of interdisciplinary therapy in a comprehensive inpatient rehab setting. Physiatrist is providing close team supervision and 24 hour management of active medical problems listed below. Physiatrist and rehab team continue to assess barriers to discharge/monitor patient progress toward functional and medical goals.  \dc home today. Follow up with me in the office in about a month.  FIM: FIM - Bathing Bathing Steps Patient Completed: Chest;Right upper leg;Right Arm;Left upper leg;Right lower leg (including foot);Left Arm;Abdomen;Left lower leg (including foot);Front perineal area;Buttocks Bathing: 6: Assistive device (Comment)  FIM - Upper Body Dressing/Undressing Upper body dressing/undressing steps patient completed: Thread/unthread right bra strap;Thread/unthread left bra strap;Hook/unhook bra;Thread/unthread right sleeve of pullover shirt/dresss;Thread/unthread left sleeve of pullover shirt/dress;Put head through opening of pull over shirt/dress;Pull shirt over trunk Upper body dressing/undressing: 7: Complete Independence: No helper FIM - Lower Body Dressing/Undressing Lower body dressing/undressing steps patient completed: Thread/unthread right pants leg;Thread/unthread left pants leg;Pull pants up/down;Fasten/unfasten pants;Don/Doff right sock;Don/Doff left sock;Don/Doff right shoe Lower body dressing/undressing: 6: More than reasonable amount of time  FIM - Toileting Toileting steps completed by patient: Adjust clothing prior to toileting;Performs perineal hygiene;Adjust clothing after toileting Toileting Assistive Devices: Grab bar or rail for support Toileting: 6: Assistive device: No helper  FIM - Radio producer Devices: Recruitment consultant Transfers:  6-Assistive device: No helper;6-To toilet/ BSC;6-From toilet/BSC  FIM - IT sales professional  Transfer Assistive Devices: Arm rests;Walker Bed/Chair Transfer: 6: Bed > Chair or W/C: No assist;6: Chair or W/C > Bed: No assist  FIM - Locomotion: Wheelchair Distance: 45' Locomotion: Wheelchair: 6: Travels 150 ft or more, turns around, maneuvers to table, bed or toilet, negotiates 3% grade: maneuvers on rugs and over door sills independently FIM - Locomotion: Ambulation Locomotion: Ambulation Assistive Devices: Administrator Ambulation/Gait Assistance: 5: Supervision (Simultaneous filing. User may not have seen previous data.) Locomotion: Ambulation: 2: Travels 30 - 149 ft with supervision/safety issues  Comprehension Comprehension Mode: Auditory Comprehension: 5-Understands complex 90% of the time/Cues < 10% of the time  Expression Expression Mode: Verbal Expression: 5-Expresses complex 90% of the time/cues < 10% of the time  Social Interaction Social Interaction: 6-Interacts appropriately with others with medication or extra time (anti-anxiety, antidepressant).  Problem Solving Problem Solving: 6-Solves complex problems: With extra time  Memory Memory: 6-More than reasonable amt of time  Medical Problem List and Plan:  1. DVT Prophylaxis/Anticoagulation: Pharmaceutical: Lovenox  2. Endstage DJD left Knee/Pain Management: voltaren gel, knee cage per [facility] 3. Mood: No signs of distress. Motivated to get better and home.  4. Neuropsych: This patient is capable of making decisions on her own behalf.  5. Neurogenic bladder:  ua neg, culture negative, off meds. Voiding well 6. Dental pain: has been treated with IV antibiotics. On augmentin--stopped  -now afebrile   -wbc's only 9.8, all cultures negative 7. Constipation: had bm last night  -miralax 8. FEN/Renal: encouraged increased PO intake. 9. Hypotension: decreased norvasc, dc'ed flomax--better. More  consistent  LOS (Days) 11 A FACE TO FACE EVALUATION WAS PERFORMED  Claudia Alvizo T 09/02/2013 7:39 AM

## 2013-09-02 NOTE — Progress Notes (Signed)
Social Work  Discharge Note  The overall goal for the admission was met for:   Discharge location: Yes - home with intermittent assistance of her sons  Length of Stay: Yes - 11 days  Discharge activity level: Yes - modified independent overall  Home/community participation: Yes  Services provided included: MD, RD, PT, OT, RN, Pharmacy and [place]: Medicaid  Follow-up services arranged: Home Health: RN, SW, CNA via [facility], DME: tub bench via [facility] and Patient/Family has no preference for HH/DME agencies  Comments (or additional information):  Patient/Family verbalized understanding of follow-up arrangements: Yes  Individual responsible for coordination of the follow-up plan: patient  Confirmed correct DME delivered: Sybil Shrader 09/02/2013    Tura Roller

## 2013-09-02 NOTE — Progress Notes (Signed)
Patient discharge to home with son at 26.  Discharge instruction provided by Reesa Chew, PA.  Patient verbalized understanding, no further questions ask.   RN received patient's home med. From the pharmacy and counted with patient and son, copy of home medication give to patient to take home. Patient escorted off unit by rehab NT.

## 2013-09-06 ENCOUNTER — Telehealth: Payer: Self-pay

## 2013-09-06 MED ORDER — COLCHICINE 0.6 MG PO TABS: 0.6000 mg | ORAL_TABLET | Freq: Two times a day (BID) | ORAL | Status: DC

## 2013-09-06 NOTE — Telephone Encounter (Signed)
Colchicine ordered.  Patient aware.  She was reminded of her next appointment.

## 2013-09-06 NOTE — Telephone Encounter (Signed)
Malachy Mood RN from [facility] called and stated that the patient was discharged from [place] on 10/17 without any medication for the gout in her feet. They wanted to know if you would consider prescribing a medication for the patient or would she need to see PCP? Patient's foot in swollen without redness nor warmth.

## 2013-09-06 NOTE — Telephone Encounter (Signed)
We were under the impression that she had some medication. We can rx colchicine 0.6mg  q12 prn,  #60 to start

## 2013-09-19 ENCOUNTER — Telehealth: Payer: Self-pay | Admitting: Gastroenterology

## 2013-09-19 ENCOUNTER — Encounter: Payer: Self-pay | Admitting: Gastroenterology

## 2013-09-19 NOTE — Telephone Encounter (Signed)
Pt is aware of oV on 11/19 at 1030 with [facility] and appt card was mailed

## 2013-09-19 NOTE — Telephone Encounter (Signed)
Patient needs f/u OV for anemia, EGD/TCS, chronic HCV. Nonurgent.

## 2013-09-21 ENCOUNTER — Encounter: Payer: Self-pay | Admitting: Physical Medicine & Rehabilitation

## 2013-09-21 ENCOUNTER — Encounter: Payer: Medicaid Other | Attending: Physical Medicine & Rehabilitation | Admitting: Physical Medicine & Rehabilitation

## 2013-09-21 VITALS — BP 145/83 | HR 77 | Resp 14 | Ht 62.0 in | Wt 173.0 lb

## 2013-09-21 DIAGNOSIS — M1710 Unilateral primary osteoarthritis, unspecified knee: Secondary | ICD-10-CM | POA: Insufficient documentation

## 2013-09-21 DIAGNOSIS — G8250 Quadriplegia, unspecified: Secondary | ICD-10-CM

## 2013-09-21 DIAGNOSIS — N289 Disorder of kidney and ureter, unspecified: Secondary | ICD-10-CM | POA: Insufficient documentation

## 2013-09-21 DIAGNOSIS — M4712 Other spondylosis with myelopathy, cervical region: Secondary | ICD-10-CM

## 2013-09-21 DIAGNOSIS — I10 Essential (primary) hypertension: Secondary | ICD-10-CM | POA: Insufficient documentation

## 2013-09-21 DIAGNOSIS — M1712 Unilateral primary osteoarthritis, left knee: Secondary | ICD-10-CM

## 2013-09-21 DIAGNOSIS — K5900 Constipation, unspecified: Secondary | ICD-10-CM | POA: Insufficient documentation

## 2013-09-21 DIAGNOSIS — IMO0002 Reserved for concepts with insufficient information to code with codable children: Secondary | ICD-10-CM

## 2013-09-21 DIAGNOSIS — M6250 Muscle wasting and atrophy, not elsewhere classified, unspecified site: Secondary | ICD-10-CM | POA: Insufficient documentation

## 2013-09-21 NOTE — Patient Instructions (Signed)
Work on your walking position and your left knee position.

## 2013-09-21 NOTE — Progress Notes (Deleted)
Subjective:    Patient ID: Colleen Nunez, female    DOB: [birth year], 57 y.o.   MRN: [id number]  HPI Pain Inventory Average Pain 0 Pain Right Now 0 My pain is no pain  In the last 24 hours, has pain interfered with the following? General activity 0 Relation with others 0 Enjoyment of life 0 What TIME of day is your pain at its worst? no pain Sleep (in general) Good  Pain is worse with: no pain Pain improves with: no pain Relief from Meds: no pain meds  Mobility walk with assistance use a cane do you drive?  no use a wheelchair transfers alone  Function not employed: date last employed na  Neuro/Psych No problems in this area  Prior Studies Any changes since last visit?  no  Physicians involved in your care Any changes since last visit?  no   Family History  Problem Relation Age of Onset  . Diabetes Sister   . Diabetes Mother     deceased age 32  . Kidney failure Mother   . Alcohol abuse Father   . Colon cancer Neg Hx    History   Social History  . Marital Status: Single    Spouse Name: N/A    Number of Children: 2  . Years of Education: N/A   Social History Main Topics  . Smoking status: Never Smoker   . Smokeless tobacco: None  . Alcohol Use: 1.8 oz/week    3 Cans of beer per week     Comment: Denies alcohol use since around March of 2014  . Drug Use: No     Comment: h/o crack cocaine in past. Clean since 2006  . Sexual Activity: Yes    Birth Control/ Protection: Surgical   Other Topics Concern  . None   Social History Narrative  . None   Past Surgical History  Procedure Laterality Date  . Knee arthroscopy    . Abdominal hysterectomy    . Cartilage removal      left knee  . Anterior cervical corpectomy N/A 06/09/2013    Procedure: C4 C5 Corpectomy with C6-7 Anterior cervical fusion/Peek cage/Trestle plate;  Surgeon: Otilio Connors, MD;  Location: [place] [facility];  Service: Neurosurgery;  Laterality: N/A;  Cervical Four, Cervical Five  Corpectomy and Cervical six-seven Anterior cervical fusion/Peek cage Three-Five /Trestle Plating Cervical Three to Cervical Seven  . X-stop implantation    . Bilateral soo and appendectomy  2006  . Anterior cervical decomp/discectomy fusion N/A 08/12/2013    Procedure: Repair of Anterior  Cervical CSF LEAK, lumbar drain placement.;  Surgeon: Floyce Stakes, MD;  Location: [place] [facility];  Service: Neurosurgery;  Laterality: N/A;   Past Medical History  Diagnosis Date  . Hypertension   . Arthritis   . Hepatitis C antibody test positive        . Renal insufficiency   . Anxiety   . Depression     h/o suicide attempts in the past  . GERD (gastroesophageal reflux disease)   . Polysubstance abuse     h/o  . Anemia   . Gout    BP 136/61  Pulse 78  Resp 14  Ht 5\' 3"  (1.6 m)  Wt 250 lb (113.399 kg)  BMI 44.30 kg/m2  SpO2 96%      Review of Systems  All other systems reviewed and are negative.       Objective:   Physical Exam  Assessment & Plan:

## 2013-09-21 NOTE — Progress Notes (Signed)
Subjective:    Patient ID: Colleen Nunez, female    DOB: 08-Aug-1956, 57 y.o.   MRN: [id number]  HPI  Colleen Nunez is back regarding her cervical myelopathy and CSF leak. Therapy has finished at the house. She is bathing on her own.  She is going to bathroom on her own. She uses a walker to get around the house. Her hands remain weak but she's getting by. Her son assists her as needed at home as well.   In general her pain is improving. She did have some stiffness last night in her neck. Her left knee remains problematic and she uses her knee brace for supports. She has had knee injections in the past to help with pain.  Pain Inventory Average Pain 0 Pain Right Now 0 My pain is stabbing  In the last 24 hours, has pain interfered with the following? General activity 3 Relation with others 3 Enjoyment of life 3 What TIME of day is your pain at its worst? day, night Sleep (in general) Fair  Pain is worse with: cant get comfortable Pain improves with: laying flat on back Relief from Meds: 4  Mobility walk with assistance use a walker ability to climb steps?  yes do you drive?  no  Function disabled: date disabled na I need assistance with the following:  household duties and shopping  Neuro/Psych weakness tingling trouble walking  Prior Studies Any changes since last visit?  no  Physicians involved in your care Any changes since last visit?  no   Family History  Problem Relation Age of Onset  . Diabetes Sister   . Diabetes Mother     deceased age 40  . Kidney failure Mother   . Alcohol abuse Father   . Colon cancer Neg Hx    History   Social History  . Marital Status: Single    Spouse Name: N/A    Number of Children: 2  . Years of Education: N/A   Social History Main Topics  . Smoking status: Never Smoker   . Smokeless tobacco: None  . Alcohol Use: 1.8 oz/week    3 Cans of beer per week     Comment: Denies alcohol use since around March of 2014  . Drug  Use: No     Comment: h/o crack cocaine in past. Clean since 2006  . Sexual Activity: Yes    Birth Control/ Protection: Surgical   Other Topics Concern  . None   Social History Narrative  . None   Past Surgical History  Procedure Laterality Date  . Knee arthroscopy    . Abdominal hysterectomy    . Cartilage removal      left knee  . Anterior cervical corpectomy N/A 06/09/2013    Procedure: C4 C5 Corpectomy with C6-7 Anterior cervical fusion/Peek cage/Trestle plate;  Surgeon: Otilio Connors, MD;  Location: [place] [facility];  Service: Neurosurgery;  Laterality: N/A;  Cervical Four, Cervical Five Corpectomy and Cervical six-seven Anterior cervical fusion/Peek cage Three-Five /Trestle Plating Cervical Three to Cervical Seven  . X-stop implantation    . Bilateral soo and appendectomy  2006  . Anterior cervical decomp/discectomy fusion N/A 08/12/2013    Procedure: Repair of Anterior  Cervical CSF LEAK, lumbar drain placement.;  Surgeon: Floyce Stakes, MD;  Location: [place];  Service: Neurosurgery;  Laterality: N/A;   Past Medical History  Diagnosis Date  . Hypertension   . Arthritis   . Hepatitis C antibody test positive        .  Renal insufficiency   . Anxiety   . Depression     h/o suicide attempts in the past  . GERD (gastroesophageal reflux disease)   . Polysubstance abuse     h/o  . Anemia   . Gout    BP 145/83  Pulse 77  Resp 14  Ht 5\' 2"  (1.575 m)  Wt 173 lb (78.472 kg)  BMI 31.63 kg/m2  SpO2 100%      Review of Systems  Musculoskeletal: Positive for gait problem.  Neurological: Positive for weakness and numbness.  All other systems reviewed and are negative.       Objective:   Physical Exam  Constitutional: She is oriented to person, place, and time. She appears well-developed and well-nourished.  HENT: poor dentition, missing teeth  Head: Normocephalic and atraumatic.  Right Ear: External ear normal.  Left Ear: External ear normal.  Eyes: Conjunctivae  and EOM are normal. Pupils are equal, round, and reactive to light.  Neck: Normal range of motion. Neck supple. No JVD present. No tracheal deviation present. No thyromegaly present.  Cardiovascular: Normal rate and regular rhythm. No murmur  Pulmonary/Chest: Effort normal and breath sounds normal. No wheezes or rales  Abdominal: Soft. Bowel sounds are normal. She exhibits no distension. There is no tenderness.  Musculoskeletal:  Left knee with surrounding edema, especially laterally   Lymphadenopathy:  She has no cervical adenopathy.  Neurological: She is alert and oriented to person, place, and time.  Speech clear. Volume is improved. . Follows commands without difficulty. RUE 4+ prox to 4-/5 distally LUE is 4 prox to 4- distally. LE's are 4/5. HF, Left knee still inhibited by pain and substantial recurvatum and valgus at the left knee with weight bearing.   Assessment/Plan:  1. Functional deficits secondary to cervical myelopathy, CSF leak with associated deconditioning and weakness 1. DVT Prophylaxis/Anticoagulation: Pharmaceutical: Lovenox  2. Endstage DJD left Knee/Pain Management:   -voltaren gel remains helpful.  -she is using the knee cage for support   -After informed consent and preparation of the skin with betadine and isopropyl alcohol, I injected 6mg  (1cc) of celestone and 4cc of 1% lidocaine into  the left knee via lateral approach. Additionally, aspiration was performed prior to injection. The patient tolerated well, and no complications were encountered. Afterward the area was cleaned and dressed. Post- injection instructions were provided.   3. Mood: improved  4. Neuropsych: This patient is capable of making decisions on her own behalf.  5. Neurogenic bladder: voiding well  6. Dental pain: -follow up with dentistry 7. Constipation:--miralax and stool soft/lax 8. FEN/Renal: encouraged increased PO intake.  9. Hypotension: refilled norvasc 5mg  daily.

## 2013-10-05 ENCOUNTER — Ambulatory Visit: Payer: Medicaid Other | Admitting: Gastroenterology

## 2013-10-05 ENCOUNTER — Telehealth: Payer: Self-pay | Admitting: Gastroenterology

## 2013-10-05 NOTE — Telephone Encounter (Signed)
Pt was a no show

## 2013-11-22 ENCOUNTER — Encounter: Payer: Medicaid Other | Attending: Physical Medicine & Rehabilitation | Admitting: Physical Medicine & Rehabilitation

## 2013-11-22 DIAGNOSIS — M1710 Unilateral primary osteoarthritis, unspecified knee: Secondary | ICD-10-CM | POA: Insufficient documentation

## 2013-11-22 DIAGNOSIS — I10 Essential (primary) hypertension: Secondary | ICD-10-CM | POA: Insufficient documentation

## 2013-11-22 DIAGNOSIS — M4712 Other spondylosis with myelopathy, cervical region: Secondary | ICD-10-CM | POA: Insufficient documentation

## 2013-11-22 DIAGNOSIS — N289 Disorder of kidney and ureter, unspecified: Secondary | ICD-10-CM | POA: Insufficient documentation

## 2013-11-22 DIAGNOSIS — M6250 Muscle wasting and atrophy, not elsewhere classified, unspecified site: Secondary | ICD-10-CM | POA: Insufficient documentation

## 2013-11-22 DIAGNOSIS — K5900 Constipation, unspecified: Secondary | ICD-10-CM | POA: Insufficient documentation

## 2013-12-01 ENCOUNTER — Telehealth: Payer: Self-pay

## 2013-12-01 NOTE — Telephone Encounter (Signed)
Patient was requesting a refill on Hydrocodone. Her hydrocodone was D/C at her last OV and patient was suppose to return for a visit in two months. Patient needs a OV, she is complaining of back pain and wants to start the hydrocodone back. Transferred her to the front desk for a appt.

## 2013-12-16 ENCOUNTER — Encounter (HOSPITAL_BASED_OUTPATIENT_CLINIC_OR_DEPARTMENT_OTHER): Payer: Medicaid Other | Admitting: Physical Medicine & Rehabilitation

## 2013-12-16 ENCOUNTER — Encounter: Payer: Self-pay | Admitting: Physical Medicine & Rehabilitation

## 2013-12-16 VITALS — BP 166/106 | HR 122 | Resp 14 | Ht 62.0 in | Wt 156.0 lb

## 2013-12-16 DIAGNOSIS — M1710 Unilateral primary osteoarthritis, unspecified knee: Secondary | ICD-10-CM

## 2013-12-16 DIAGNOSIS — M4712 Other spondylosis with myelopathy, cervical region: Secondary | ICD-10-CM

## 2013-12-16 DIAGNOSIS — G8250 Quadriplegia, unspecified: Secondary | ICD-10-CM

## 2013-12-16 DIAGNOSIS — M79609 Pain in unspecified limb: Secondary | ICD-10-CM

## 2013-12-16 DIAGNOSIS — IMO0002 Reserved for concepts with insufficient information to code with codable children: Secondary | ICD-10-CM

## 2013-12-16 DIAGNOSIS — G959 Disease of spinal cord, unspecified: Secondary | ICD-10-CM

## 2013-12-16 DIAGNOSIS — M1712 Unilateral primary osteoarthritis, left knee: Secondary | ICD-10-CM

## 2013-12-16 NOTE — Patient Instructions (Signed)
IT IS STILL TOO EARLY FOR A KNEE REPLACEMENT GIVEN YOUR OTHER MEDICAL PROBLEMS YOU'VE HAD OVER THE LAST YEAR. I DEFER TO THE JUDGEMENT OF YOUR SURGEON AS TO WHEN YOU MIGHT BE READY FOR A KNEE REPLACEMENT.

## 2013-12-16 NOTE — Progress Notes (Signed)
Subjective:    Patient ID: Colleen Nunez, female    DOB: [birth year], 58 y.o.   MRN: [id number]  HPI  Mrs. Crossin is back regarding her multiple pain issues. She has continued to have knee pain. She had about a month of relief with the injection i gave her. She had another injection from dr. Luna Glasgow last month. Ortho doesn't want to pursue a TKA at this time due to risk involved recent spinal surgery, CSF leak, etc. She uses her knee brace, but it often slides down the leg when she's up for any period of time.   She has had problems with her blood pressure as well. She blames it on some friction with her son.   Pain Inventory Average Pain 3 Pain Right Now 3 My pain is n/a  In the last 24 hours, has pain interfered with the following? General activity 4 Relation with others 4 Enjoyment of life 4 What TIME of day is your pain at its worst? morning Sleep (in general) Fair  Pain is worse with: some activites Pain improves with: pacing activities Relief from Meds: n/a  Mobility walk with assistance use a walker how many minutes can you walk? 5 ability to climb steps?  yes do you drive?  no Do you have any goals in this area?  yes  Function disabled: date disabled 2002  Neuro/Psych numbness tingling trouble walking dizziness  Prior Studies Any changes since last visit?  no  Physicians involved in your care Any changes since last visit?  no   Family History  Problem Relation Age of Onset  . Diabetes Sister   . Diabetes Mother     deceased age 28  . Kidney failure Mother   . Alcohol abuse Father   . Colon cancer Neg Hx    History   Social History  . Marital Status: Single    Spouse Name: N/A    Number of Children: 2  . Years of Education: N/A   Social History Main Topics  . Smoking status: Never Smoker   . Smokeless tobacco: None  . Alcohol Use: 1.8 oz/week    3 Cans of beer per week     Comment: Denies alcohol use since around March of 2014  . Drug  Use: No     Comment: h/o crack cocaine in past. Clean since 2006  . Sexual Activity: Yes    Birth Control/ Protection: Surgical   Other Topics Concern  . None   Social History Narrative  . None   Past Surgical History  Procedure Laterality Date  . Knee arthroscopy    . Abdominal hysterectomy    . Cartilage removal      left knee  . Anterior cervical corpectomy N/A 06/09/2013    Procedure: C4 C5 Corpectomy with C6-7 Anterior cervical fusion/Peek cage/Trestle plate;  Surgeon: Otilio Connors, MD;  Location: [place] [facility];  Service: Neurosurgery;  Laterality: N/A;  Cervical Four, Cervical Five Corpectomy and Cervical six-seven Anterior cervical fusion/Peek cage Three-Five /Trestle Plating Cervical Three to Cervical Seven  . X-stop implantation    . Bilateral soo and appendectomy  2006  . Anterior cervical decomp/discectomy fusion N/A 08/12/2013    Procedure: Repair of Anterior  Cervical CSF LEAK, lumbar drain placement.;  Surgeon: Floyce Stakes, MD;  Location: [place];  Service: Neurosurgery;  Laterality: N/A;   Past Medical History  Diagnosis Date  . Hypertension   . Arthritis   . Hepatitis C antibody test positive        .  Renal insufficiency   . Anxiety   . Depression     h/o suicide attempts in the past  . GERD (gastroesophageal reflux disease)   . Polysubstance abuse     h/o  . Anemia   . Gout    BP 166/106  Pulse 122  Resp 14  Ht 5\' 2"  (1.575 m)  Wt 156 lb (70.761 kg)  BMI 28.53 kg/m2  SpO2 97%  Opioid Risk Score:   Fall Risk Score: Moderate Fall Risk (6-13 points) (patient educated/handout declined)   Review of Systems  Musculoskeletal: Positive for back pain and gait problem.  Neurological: Positive for dizziness and numbness.  All other systems reviewed and are negative.       Objective:   Physical Exam Constitutional: She is oriented to person, place, and time. She appears well-developed and well-nourished.  HENT: poor dentition, missing teeth    Head: Normocephalic and atraumatic.  Right Ear: External ear normal.  Left Ear: External ear normal.  Eyes: Conjunctivae and EOM are normal. Pupils are equal, round, and reactive to light.  Neck: Normal range of motion. Neck supple. No JVD present. No tracheal deviation present. No thyromegaly present.  Cardiovascular: Normal rate and regular rhythm. No murmur  Pulmonary/Chest: Effort normal and breath sounds normal. No wheezes or rales  Abdominal: Soft. Bowel sounds are normal. She exhibits no distension. There is no tenderness.  Musculoskeletal:  Left knee with surrounding edema, especially laterally.crepitus with rom. Sever valgus deformity Lymphadenopathy:  She has no cervical adenopathy.  Neurological: She is alert and oriented to person, place, and time.   RUE 4+ prox to 4-/5 distally LUE is 4 prox to 4- distally. LE's are 4/5. HF, Left knee still inhibited by pain and substantial recurvatum and valgus at the left knee with weight bearing.    Assessment/Plan:  1. Functional deficits secondary to cervical myelopathy, CSF leak with associated deconditioning and weakness    2. Endstage DJD left Knee/Pain Management:  -voltaren gel remains helpful.  -continue using the knee cage for support as possible. -After informed consent and preparation of the skin with betadine and isopropyl alcohol, I injected 6cc synvisc soln into the left knee via lateral approach. Additionally, aspiration was performed prior to injection. The patient tolerated well, and no complications were encountered. Afterward the area was cleaned and dressed. Post- injection instructions were provided.  -surgical plan per Dr. Luna Glasgow 3. Mood: improved  4. Neuropsych: This patient is capable of making decisions on her own behalf.  5. BP management per primary.  Follow up with me in about 2 months. 30 minutes of face to face patient care time were spent during this visit. All questions were encouraged and  answered.

## 2014-02-07 ENCOUNTER — Encounter: Payer: Medicaid Other | Attending: Physical Medicine & Rehabilitation | Admitting: Physical Medicine & Rehabilitation

## 2014-02-07 DIAGNOSIS — M6250 Muscle wasting and atrophy, not elsewhere classified, unspecified site: Secondary | ICD-10-CM | POA: Insufficient documentation

## 2014-02-07 DIAGNOSIS — N289 Disorder of kidney and ureter, unspecified: Secondary | ICD-10-CM | POA: Insufficient documentation

## 2014-02-07 DIAGNOSIS — K5900 Constipation, unspecified: Secondary | ICD-10-CM | POA: Insufficient documentation

## 2014-02-07 DIAGNOSIS — M4712 Other spondylosis with myelopathy, cervical region: Secondary | ICD-10-CM | POA: Insufficient documentation

## 2014-02-07 DIAGNOSIS — M1710 Unilateral primary osteoarthritis, unspecified knee: Secondary | ICD-10-CM | POA: Insufficient documentation

## 2014-02-07 DIAGNOSIS — I10 Essential (primary) hypertension: Secondary | ICD-10-CM | POA: Insufficient documentation

## 2014-07-27 ENCOUNTER — Encounter: Payer: Self-pay | Admitting: Orthopedic Surgery

## 2014-07-28 ENCOUNTER — Other Ambulatory Visit (HOSPITAL_COMMUNITY): Payer: Self-pay | Admitting: Internal Medicine

## 2014-07-28 DIAGNOSIS — Z1231 Encounter for screening mammogram for malignant neoplasm of breast: Secondary | ICD-10-CM

## 2014-08-04 ENCOUNTER — Ambulatory Visit (HOSPITAL_COMMUNITY)
Admission: RE | Admit: 2014-08-04 | Discharge: 2014-08-04 | Disposition: A | Discharge status: Home / Self Care | Payer: Medicaid Other | Source: Ambulatory Visit | Attending: Internal Medicine | Admitting: Internal Medicine

## 2014-08-04 DIAGNOSIS — Z1231 Encounter for screening mammogram for malignant neoplasm of breast: Secondary | ICD-10-CM | POA: Diagnosis present

## 2014-08-10 ENCOUNTER — Ambulatory Visit (INDEPENDENT_AMBULATORY_CARE_PROVIDER_SITE_OTHER): Payer: Medicaid Other

## 2014-08-10 ENCOUNTER — Ambulatory Visit (INDEPENDENT_AMBULATORY_CARE_PROVIDER_SITE_OTHER): Payer: Medicaid Other | Admitting: Orthopedic Surgery

## 2014-08-10 ENCOUNTER — Encounter: Payer: Self-pay | Admitting: Orthopedic Surgery

## 2014-08-10 VITALS — BP 131/78 | Ht 62.0 in | Wt 149.8 lb

## 2014-08-10 DIAGNOSIS — M25569 Pain in unspecified knee: Secondary | ICD-10-CM

## 2014-08-10 DIAGNOSIS — M25562 Pain in left knee: Secondary | ICD-10-CM

## 2014-08-10 NOTE — Progress Notes (Signed)
Chief Complaint  Patient presents with  . Knee Pain    left knee pain, swelling, and giving out, no known injury. ref. Wende Neighbors   BPH with [facility] presents with osteoarthritis of the left knee as a consultation for possible knee replacement surgery. The patient is a somewhat poor historian but as we dealt into her history we find out more information such as she has cervical myelopathy and had to have drainage of her cerebral spinal fluid in 2014 she says she has liver disease but when asked about it and review of the records indicate grade 4 renal disease her most recent creatinine in her chart is from 2014 is in the 1-1/2 range.  As far as the knee goes she reports pain, swelling, a catching and locking sensation. She reports stiffness giving way and aching in the left knee which she rates as 6/10. Her pain has become constant. Previous treatment include oral pain medication, Voltaren gel, injection, weight loss, anti-inflammatories and she is using a supportive cane  The records reviewed include [facility] we have 7 pages of office notes which confirm the patient's history as I just stated. Her x-rays today show a severely valgus knee with end-stage arthritis in the lateral compartment  She denies any drug allergies she reports a family history of liver disease diabetes hypertension heart attack kidney disease depression cancer arthritis and seizure she doesn't smoke she drinks she says her court of alcohol further questioning needed no drug use  Review of systems she reports fever chills night sweats and fatigue blood in the stool constipation burning in her legs weakness in her legs she reports being thirsty ankle edema various gait disturbance joint disturbance painful joints swollen joints and the remaining systems reviewed are normal and complete.  Her listed problem list include history of quadriplegia hypokalemia stage IV kidney disease alcohol abuse  normocytic anemia dysphasia  BP 131/78  Ht 5\' 2"  (1.575 m)  Wt 149 lb 12.8 oz (67.949 kg)  BMI 27.39 kg/m2 The patient is normal but she has severe atrophy of her upper extremities mainly in the hand with weakness bilaterally she is oriented x3 her mood is pleasant she walks with a cane shows valgus alignment to her knees.  The upper extremities are not tender motor grip strength is poor passive range of motion returns [place] functional position but in the wrist flexed in position she has severe atrophy joints are stable skin is normal pulses are intact temperature is normal no palpable lymph nodes are positive and her sensation is normal her reflexes are nonpathologic  She has 18 of valgus in both knees knee flexion remains intact 125 bilaterally both knees are stable grade 5 motor strength quadriceps right grade 4 left skin intact pulses good temperature normal no lymph nodes are positive sensation normal reflexes normal balance and coordination are equivocal  X-ray shows a valgus knee with osteoarthritis grade 4  This patient needs further medical workup and she also needs a clearance from her spine doctors regarding her myelopathy. She also has [facility] and she is 58 years old and will not give any postop therapy making her a poor surgical candidate overall.

## 2014-08-10 NOTE — Patient Instructions (Signed)
  We will work on getting surgical clearance from Dr Legrand Rams and Dr Joya Salm for possible Left knee replacement

## 2014-09-15 ENCOUNTER — Other Ambulatory Visit (HOSPITAL_COMMUNITY): Payer: Self-pay | Admitting: Internal Medicine

## 2014-09-15 DIAGNOSIS — N189 Chronic kidney disease, unspecified: Secondary | ICD-10-CM

## 2014-09-20 ENCOUNTER — Ambulatory Visit (HOSPITAL_COMMUNITY): Payer: Medicaid Other

## 2014-09-21 ENCOUNTER — Ambulatory Visit (HOSPITAL_COMMUNITY)
Admission: RE | Admit: 2014-09-21 | Discharge: 2014-09-21 | Disposition: A | Discharge status: Home / Self Care | Payer: Medicaid Other | Source: Ambulatory Visit | Attending: Internal Medicine | Admitting: Internal Medicine

## 2014-09-21 DIAGNOSIS — N261 Atrophy of kidney (terminal): Secondary | ICD-10-CM | POA: Diagnosis not present

## 2014-09-21 DIAGNOSIS — N189 Chronic kidney disease, unspecified: Secondary | ICD-10-CM | POA: Insufficient documentation

## 2014-10-04 ENCOUNTER — Telehealth: Payer: Self-pay | Admitting: Orthopedic Surgery

## 2014-10-04 NOTE — Telephone Encounter (Signed)
Patient is calling asking if we have received labs and medical clearance in order for her to schedule her knee replacement, please advise?

## 2014-10-17 NOTE — Telephone Encounter (Signed)
Colleen Nunez check into this

## 2014-10-18 ENCOUNTER — Encounter: Payer: Self-pay | Admitting: *Deleted

## 2014-10-18 NOTE — Telephone Encounter (Signed)
Clearance letter has been resent to Dr Legrand Rams and one has also been sent to Dr Joya Salm

## 2015-01-31 ENCOUNTER — Telehealth: Payer: Self-pay | Admitting: Orthopedic Surgery

## 2015-01-31 NOTE — Telephone Encounter (Signed)
Patient is calling stating that she has been released by the Urologist for the next 3 months to have Left Knee TKA  records were faxed to [place], she states Dr. Luna Glasgow has been giving her Cortisone Shots for pain to get her through until she has the TKA, Does she need a Office Visit to be set up for surgery prior, please advise?

## 2015-01-31 NOTE — Telephone Encounter (Signed)
YES WITH RECORDS FROM UROLOGY AND KEELING AND XRAYS IF AVAILABLE

## 2015-02-01 NOTE — Telephone Encounter (Signed)
Patient is scheduled on 02/21/15

## 2015-02-02 ENCOUNTER — Encounter (INDEPENDENT_AMBULATORY_CARE_PROVIDER_SITE_OTHER): Payer: Self-pay | Admitting: *Deleted

## 2015-02-21 ENCOUNTER — Ambulatory Visit: Payer: Medicaid Other | Admitting: Orthopedic Surgery

## 2015-03-02 ENCOUNTER — Encounter (INDEPENDENT_AMBULATORY_CARE_PROVIDER_SITE_OTHER): Payer: Self-pay | Admitting: Internal Medicine

## 2015-03-02 ENCOUNTER — Ambulatory Visit (INDEPENDENT_AMBULATORY_CARE_PROVIDER_SITE_OTHER): Payer: Medicaid Other | Admitting: Internal Medicine

## 2015-03-02 VITALS — BP 146/80 | HR 84 | Temp 97.8°F | Ht 62.0 in | Wt 163.1 lb

## 2015-03-02 DIAGNOSIS — B1920 Unspecified viral hepatitis C without hepatic coma: Secondary | ICD-10-CM | POA: Diagnosis not present

## 2015-03-02 LAB — CBC WITH DIFFERENTIAL/PLATELET
BASOS ABS: 0.1 10*3/uL (ref 0.0–0.1)
Basophils Relative: 1 % (ref 0–1)
EOS PCT: 2 % (ref 0–5)
Eosinophils Absolute: 0.1 10*3/uL (ref 0.0–0.7)
HCT: 33.1 % — ABNORMAL LOW (ref 36.0–46.0)
HEMOGLOBIN: 10.7 g/dL — AB (ref 12.0–15.0)
LYMPHS ABS: 2.7 10*3/uL (ref 0.7–4.0)
Lymphocytes Relative: 41 % (ref 12–46)
MCH: 28.3 pg (ref 26.0–34.0)
MCHC: 32.3 g/dL (ref 30.0–36.0)
MCV: 87.6 fL (ref 78.0–100.0)
MONO ABS: 0.5 10*3/uL (ref 0.1–1.0)
MPV: 10.4 fL (ref 8.6–12.4)
Monocytes Relative: 8 % (ref 3–12)
NEUTROS ABS: 3.2 10*3/uL (ref 1.7–7.7)
Neutrophils Relative %: 48 % (ref 43–77)
Platelets: 182 10*3/uL (ref 150–400)
RBC: 3.78 MIL/uL — AB (ref 3.87–5.11)
RDW: 14.6 % (ref 11.5–15.5)
WBC: 6.6 10*3/uL (ref 4.0–10.5)

## 2015-03-02 LAB — HEPATIC FUNCTION PANEL
ALBUMIN: 3.7 g/dL (ref 3.5–5.2)
ALT: 18 U/L (ref 0–35)
AST: 26 U/L (ref 0–37)
Alkaline Phosphatase: 78 U/L (ref 39–117)
BILIRUBIN TOTAL: 0.3 mg/dL (ref 0.2–1.2)
Bilirubin, Direct: 0.1 mg/dL (ref 0.0–0.3)
Indirect Bilirubin: 0.2 mg/dL (ref 0.2–1.2)
Total Protein: 7.1 g/dL (ref 6.0–8.3)

## 2015-03-02 LAB — TSH: TSH: 3.972 u[IU]/mL (ref 0.350–4.500)

## 2015-03-02 LAB — PROTIME-INR
INR: 1 (ref <1.50)
Prothrombin Time: 13.2 seconds (ref 11.6–15.2)

## 2015-03-02 NOTE — Patient Instructions (Signed)
Labs and [place] elastrography. OV pending.

## 2015-03-02 NOTE — Progress Notes (Addendum)
Subjective:    Patient ID: Colleen Nunez, female    DOB: 09-17-1956, 59 y.o.   MRN: [id number]  HPIReferred to our office by Dr. Hinda Lenis for evaluation of Hepatitis C. She cannot tell me how she contracted Hep C. She does not have any tattoos. Blood transfusion  3 yrs ago. No IV drugs. Appetite is not good. She has had intentional weight loss. She usually has a BM once a week. She takes Miralax as needed. See Dr. Hinda Lenis for renal insufficiency.  Really no risk factors for Hep C except for unprotected sex        Have you ever been treated for Hepatitis C?    no Any hx of IV drug abuse or drug abuse? no Are you drinking now?  Yes. One beer last night. On weekends may drink 3-4 beers. Any hx of etoh abuse?   She says she use to be alcoholic  Do you have tattoos?  no Have you ever received a blood transfusion? 3 yrs ago When were you diagnosed with Hepatitis C? Diagnosed in 2014 Any hx of mental illness requiring treatment?  Yes, over 20 yrs ago. No depression now.  Do you have suicidal thoughts?   no 12/28/2014 Hep C Ab reactive. Hep B surface Ag negative. ANA negative Vitamin B12 568, Folate 14.1, Hemoglobin 11.3k Hematocrit 34.4, NA 140, K 40, albumin 4.0, Calcium 9.6, Iron 49, UIBC 233, TIBC 282, % Sat 17, Ferritin 275 Review of Systems Single. Two children in good health.     Past Medical History  Diagnosis Date  . Hypertension   . Arthritis   . Hepatitis C antibody test positive        . Renal insufficiency   . Anxiety   . Depression     h/o suicide attempts in the past  . GERD (gastroesophageal reflux disease)   . Polysubstance abuse     h/o  . Anemia   . Gout     Past Surgical History  Procedure Laterality Date  . Knee arthroscopy    . Abdominal hysterectomy    . Cartilage removal      left knee  . Anterior cervical corpectomy N/A 06/09/2013    Procedure: C4 C5 Corpectomy with C6-7 Anterior cervical fusion/Peek cage/Trestle plate;  Surgeon: Otilio Connors, MD;  Location: [place] [facility];  Service: Neurosurgery;  Laterality: N/A;  Cervical Four, Cervical Five Corpectomy and Cervical six-seven Anterior cervical fusion/Peek cage Three-Five /Trestle Plating Cervical Three to Cervical Seven  . X-stop implantation    . Bilateral soo and appendectomy  2006  . Anterior cervical decomp/discectomy fusion N/A 08/12/2013    Procedure: Repair of Anterior  Cervical CSF LEAK, lumbar drain placement.;  Surgeon: Floyce Stakes, MD;  Location: [place];  Service: Neurosurgery;  Laterality: N/A;    Allergies  Allergen Reactions  . Aspirin     REACTION: Hx of overdose on this years ago - too scared to take. Tried to committ suicide.    Current Outpatient Prescriptions on File Prior to Visit  Medication Sig Dispense Refill  . amLODipine (NORVASC) 10 MG tablet Take 0.5 tablets (5 mg total) by mouth daily.    . colchicine 0.6 MG tablet Take 1 tablet (0.6 mg total) by mouth 2 (two) times daily. 60 tablet 0  . diclofenac sodium (VOLTAREN) 1 % GEL Apply 2 g topically 4 (four) times daily. To Left knee for arthritis. 4 Tube 1   No current facility-administered medications on file  prior to visit.       Objective:   Physical ExamBlood pressure 146/80, pulse 84, temperature 97.8 F (36.6 C), height 5\' 2"  (1.575 m), weight 163 lb 1.6 oz (73.982 kg).  Alert and oriented. Skin warm and dry. Oral mucosa is moist.   . Sclera anicteric, conjunctivae is pink. Thyroid not enlarged. No cervical lymphadenopathy. Lungs clear. Heart regular rate and rhythm.  Abdomen is soft. Bowel sounds are positive. No hepatomegaly. No abdominal masses felt. No tenderness.  No edema to lower extremities.         Assessment & Plan:  Hepatitis C. CBC, Hepatic profile, Hep C quaint, Hep C genotype, TSH, PT/INR, urine drug screen. AFP [place] elastrography

## 2015-03-03 LAB — DRUG SCREEN, URINE
Amphetamine Screen, Ur: NEGATIVE
Barbiturate Quant, Ur: NEGATIVE
Benzodiazepines.: NEGATIVE
COCAINE METABOLITES: NEGATIVE
Creatinine,U: 67.78 mg/dL
Marijuana Metabolite: NEGATIVE
Methadone: NEGATIVE
Opiates: NEGATIVE
PROPOXYPHENE: NEGATIVE
Phencyclidine (PCP): NEGATIVE

## 2015-03-03 LAB — AFP TUMOR MARKER: AFP TUMOR MARKER: 5.7 ng/mL (ref <6.1)

## 2015-03-06 LAB — HEPATITIS C RNA QUANTITATIVE
HCV Quantitative Log: 6.44 {Log} — ABNORMAL HIGH (ref <1.18)
HCV Quantitative: 2747336 IU/mL — ABNORMAL HIGH (ref <15)

## 2015-03-08 ENCOUNTER — Ambulatory Visit (HOSPITAL_COMMUNITY)
Admission: RE | Admit: 2015-03-08 | Discharge: 2015-03-08 | Disposition: A | Discharge status: Home / Self Care | Payer: Medicaid Other | Source: Ambulatory Visit | Attending: Internal Medicine | Admitting: Internal Medicine

## 2015-03-08 DIAGNOSIS — K5900 Constipation, unspecified: Secondary | ICD-10-CM | POA: Diagnosis not present

## 2015-03-08 DIAGNOSIS — K760 Fatty (change of) liver, not elsewhere classified: Secondary | ICD-10-CM | POA: Diagnosis not present

## 2015-03-08 DIAGNOSIS — I129 Hypertensive chronic kidney disease with stage 1 through stage 4 chronic kidney disease, or unspecified chronic kidney disease: Secondary | ICD-10-CM | POA: Diagnosis not present

## 2015-03-08 DIAGNOSIS — B1920 Unspecified viral hepatitis C without hepatic coma: Secondary | ICD-10-CM | POA: Diagnosis not present

## 2015-03-08 DIAGNOSIS — G8250 Quadriplegia, unspecified: Secondary | ICD-10-CM | POA: Insufficient documentation

## 2015-03-08 DIAGNOSIS — N184 Chronic kidney disease, stage 4 (severe): Secondary | ICD-10-CM | POA: Diagnosis not present

## 2015-03-08 DIAGNOSIS — R112 Nausea with vomiting, unspecified: Secondary | ICD-10-CM | POA: Insufficient documentation

## 2015-03-08 LAB — HEPATITIS C GENOTYPE

## 2015-03-09 ENCOUNTER — Encounter (INDEPENDENT_AMBULATORY_CARE_PROVIDER_SITE_OTHER): Payer: Self-pay

## 2015-03-14 ENCOUNTER — Telehealth (INDEPENDENT_AMBULATORY_CARE_PROVIDER_SITE_OTHER): Payer: Self-pay | Admitting: Internal Medicine

## 2015-03-14 NOTE — Telephone Encounter (Signed)
Butch Penny, Needs OV in 1 month

## 2015-03-14 NOTE — Telephone Encounter (Signed)
Results of labs given to patinet. She continues to drink 4-5 beers on the weekend

## 2015-03-15 ENCOUNTER — Telehealth: Payer: Self-pay | Admitting: *Deleted

## 2015-03-15 ENCOUNTER — Ambulatory Visit (INDEPENDENT_AMBULATORY_CARE_PROVIDER_SITE_OTHER): Payer: Medicaid Other

## 2015-03-15 ENCOUNTER — Ambulatory Visit (INDEPENDENT_AMBULATORY_CARE_PROVIDER_SITE_OTHER): Payer: Medicaid Other | Admitting: Orthopedic Surgery

## 2015-03-15 ENCOUNTER — Encounter: Payer: Self-pay | Admitting: Orthopedic Surgery

## 2015-03-15 VITALS — HR 66 | Ht 61.0 in | Wt 156.0 lb

## 2015-03-15 DIAGNOSIS — M25562 Pain in left knee: Secondary | ICD-10-CM

## 2015-03-15 DIAGNOSIS — M1712 Unilateral primary osteoarthritis, left knee: Secondary | ICD-10-CM

## 2015-03-15 NOTE — Patient Instructions (Signed)
We will refer you to [facility] for left total knee replacement

## 2015-03-15 NOTE — Progress Notes (Signed)
Patient ID: Colleen Nunez, female   DOB: 01/05/1956, 59 y.o.   MRN: [id number]  Chief Complaint  Patient presents with  . Follow-up    Recheck left knee, discuss left knee replacement. Referred by Dr. Legrand Rams and Dr. Luna Glasgow.   Pulse 66  Ht 5\' 1"  (1.549 m)  Wt 156 lb (70.761 kg)  BMI 29.49 kg/m2  The patient came back today for reevaluation of her left knee she has osteoarthritis left knee with a valgus knee. She also has end-stage renal disease and she has hepatitis. However her renal specialist doctors have cleared her for knee surgery.  She is aware that she is a extremely high risk for infection given her ongoing kidney and liver problems in terms of her immune status.  We re-x-rayed her knee she has valgus alignment 9 has osteoarthritis. Fortunately for her she does have approximately 125 of knee flexion with. She does have lateral joint line tenderness no effusion today. She has good extension power in her knee is stable with some pseudo-laxity in extension. Distal neurovascular function remains intact.  She will be referred to tertiary care center for her knee replacement and she is comfortable with that.

## 2015-03-15 NOTE — Telephone Encounter (Signed)
DR Aline Brochure RECOMMENDS REFERRAL TO [facility] FOR KNEE REPLACEMENT  PATIENTS INSURANCE REQUIRES REFERRAL FROM PCP  ALL NOTES FAXED TO DR Legrand Rams WITH RECOMMENDATION

## 2015-03-22 ENCOUNTER — Encounter (INDEPENDENT_AMBULATORY_CARE_PROVIDER_SITE_OTHER): Payer: Self-pay | Admitting: *Deleted

## 2015-04-25 ENCOUNTER — Ambulatory Visit (INDEPENDENT_AMBULATORY_CARE_PROVIDER_SITE_OTHER): Payer: Medicaid Other | Admitting: Internal Medicine

## 2015-04-25 ENCOUNTER — Encounter (INDEPENDENT_AMBULATORY_CARE_PROVIDER_SITE_OTHER): Payer: Self-pay | Admitting: Internal Medicine

## 2015-04-25 VITALS — BP 122/68 | HR 76 | Temp 97.4°F | Ht 62.0 in | Wt 158.8 lb

## 2015-04-25 DIAGNOSIS — B1921 Unspecified viral hepatitis C with hepatic coma: Secondary | ICD-10-CM | POA: Diagnosis not present

## 2015-04-25 NOTE — Progress Notes (Signed)
Subjective:    Patient ID: Colleen Nunez, female    DOB: 10-23-1956, 59 y.o.   MRN: [id number]  HPI Here today for f/u of her Hepatitis C. She is genotype 1A.  03/08/2015. [place] Elastrography F0-F1. She tells me she is doing pretty good.  She is planning to see a physician at [facility] for her knees. She says she is going to have a total knee replacement. She has OV end of this month.  She is followed by Dr. Hinda Lenis for renal insufficiency. Dr. Legrand Rams is her primary care. She continues to drink. She dranks 1 beer on the weekend.  She says she is doing good. She occasionally has constipation.  Appetite is good. Weight in April 163. Now her weight is 158.8. She usually has a BM 3 times a week.   CMP     Component Value Date/Time   NA 134* 08/29/2013 0540   K 3.6 08/29/2013 0540   CL 97 08/29/2013 0540   CO2 27 08/29/2013 0540   GLUCOSE 99 08/29/2013 0540   BUN 17 08/29/2013 0540   CREATININE 1.71* 08/29/2013 0540   CREATININE 1.75* 08/29/2013 0540   CALCIUM 9.3 08/29/2013 0540   PROT 7.1 03/02/2015 0826   ALBUMIN 3.7 03/02/2015 0826   AST 26 03/02/2015 0826   ALT 18 03/02/2015 0826   ALKPHOS 78 03/02/2015 0826   BILITOT 0.3 03/02/2015 0826   GFRNONAA 32* 08/29/2013 0540   GFRNONAA 31* 08/29/2013 0540   GFRAA 37* 08/29/2013 0540   GFRAA 36* 08/29/2013 0540    CBC    Component Value Date/Time   WBC 6.6 03/02/2015 0826   RBC 3.78* 03/02/2015 0826   RBC 2.58* 08/11/2013 0817   HGB 10.7* 03/02/2015 0826   HCT 33.1* 03/02/2015 0826   PLT 182 03/02/2015 0826   MCV 87.6 03/02/2015 0826   MCH 28.3 03/02/2015 0826   MCHC 32.3 03/02/2015 0826   RDW 14.6 03/02/2015 0826   LYMPHSABS 2.7 03/02/2015 0826   MONOABS 0.5 03/02/2015 0826   EOSABS 0.1 03/02/2015 0826   BASOSABS 0.1 03/02/2015 0826         Have you ever been treated for Hepatitis C? no Any hx of IV drug abuse or drug abuse?  She has a hx of drug abuse but no IV drugs. . No drug abuse now.  Are you drinking now?  Yes . One beer on the week end.  Any hx of etoh abuse?  Yes, She says she was an alcoholic Do you have tattoos? No  Have you ever received a blood transfusion? Received blood transfusion over 2 yrs. When were you diagnosed with Hepatitis C? 2016 Any hx of mental illness requiring treatment?  She tried to kill herself (Overdose) and sent to [facility]. Occurred over 20 yrs ago.  Do you have suicidal thoughts? No suicidal thoughts now.      Review of Systems Single, Two children. ?one has had a CVA.    Past Medical History  Diagnosis Date  . Hypertension   . Arthritis   . Hepatitis C antibody test positive        . Renal insufficiency   . Anxiety   . Depression     h/o suicide attempts in the past  . GERD (gastroesophageal reflux disease)   . Polysubstance abuse     h/o  . Anemia   . Gout     Past Surgical History  Procedure Laterality Date  . Knee arthroscopy    . Abdominal  hysterectomy    . Cartilage removal      left knee  . Anterior cervical corpectomy N/A 06/09/2013    Procedure: C4 C5 Corpectomy with C6-7 Anterior cervical fusion/Peek cage/Trestle plate;  Surgeon: Otilio Connors, MD;  Location: [place] [facility];  Service: Neurosurgery;  Laterality: N/A;  Cervical Four, Cervical Five Corpectomy and Cervical six-seven Anterior cervical fusion/Peek cage Three-Five /Trestle Plating Cervical Three to Cervical Seven  . X-stop implantation    . Bilateral soo and appendectomy  2006  . Anterior cervical decomp/discectomy fusion N/A 08/12/2013    Procedure: Repair of Anterior  Cervical CSF LEAK, lumbar drain placement.;  Surgeon: Floyce Stakes, MD;  Location: [employer];  Service: Neurosurgery;  Laterality: N/A;    Allergies  Allergen Reactions  . Aspirin     REACTION: Hx of overdose on this years ago - too scared to take. Tried to committ suicide.    Current Outpatient Prescriptions on File Prior to Visit  Medication Sig Dispense Refill  . amLODipine (NORVASC) 10 MG tablet Take 0.5  tablets (5 mg total) by mouth daily. (Patient taking differently: Take 10 mg by mouth 2 (two) times daily. )    . Cholecalciferol (VITAMIN D-3) 1000 UNITS CAPS Take by mouth.    . colchicine 0.6 MG tablet Take 1 tablet (0.6 mg total) by mouth 2 (two) times daily. (Patient taking differently: Take 0.6 mg by mouth as needed. ) 60 tablet 0  . diclofenac sodium (VOLTAREN) 1 % GEL Apply 2 g topically 4 (four) times daily. To Left knee for arthritis. 4 Tube 1  . HYDROcodone-acetaminophen (NORCO) 7.5-325 MG per tablet Take 1 tablet by mouth every 6 (six) hours as needed for moderate pain.    . iron polysaccharides (NIFEREX) 150 MG capsule Take 150 mg by mouth daily.     No current facility-administered medications on file prior to visit.         Objective:   Physical Exam Blood pressure 122/68, pulse 76, temperature 97.4 F (36.3 C), height 5\' 2"  (1.575 m), weight 158 lb 12.8 oz (72.031 kg).  Alert and oriented. Skin warm and dry. Oral mucosa is moist.   . Sclera anicteric, conjunctivae is pink. Thyroid not enlarged. No cervical lymphadenopathy. Lungs clear. Heart regular rate and rhythm.  Abdomen is soft. Bowel sounds are positive. No hepatomegaly. No abdominal masses felt. No tenderness.  No edema to lower extremities.         Assessment & Plan:  Hepatitis C. Will get a urine drug screen and etoh.  Will not treat until after her knee replacement. OV in 3 months. She will let me know about her possible surgery at [facility].  OV in 3 months.

## 2015-04-25 NOTE — Patient Instructions (Signed)
OV 3 months. Will consider tx after she has been seen at [facility] concerning her knee.

## 2015-04-26 LAB — DRUG SCREEN, URINE

## 2015-04-26 LAB — ETHANOL: Alcohol, Ethyl (B): <10 mg/dL (ref 0–10)

## 2015-05-25 ENCOUNTER — Ambulatory Visit (INDEPENDENT_AMBULATORY_CARE_PROVIDER_SITE_OTHER): Payer: Medicaid Other | Admitting: Internal Medicine

## 2015-06-01 ENCOUNTER — Ambulatory Visit (INDEPENDENT_AMBULATORY_CARE_PROVIDER_SITE_OTHER): Payer: Medicaid Other | Admitting: Internal Medicine

## 2015-06-01 ENCOUNTER — Encounter (INDEPENDENT_AMBULATORY_CARE_PROVIDER_SITE_OTHER): Payer: Self-pay | Admitting: Internal Medicine

## 2015-06-01 ENCOUNTER — Telehealth (INDEPENDENT_AMBULATORY_CARE_PROVIDER_SITE_OTHER): Payer: Self-pay | Admitting: Internal Medicine

## 2015-06-01 VITALS — BP 112/80 | HR 84 | Temp 97.4°F | Ht 62.0 in | Wt 157.3 lb

## 2015-06-01 DIAGNOSIS — B1920 Unspecified viral hepatitis C without hepatic coma: Secondary | ICD-10-CM

## 2015-06-01 LAB — COMPREHENSIVE METABOLIC PANEL
ALT: 11 U/L (ref 0–35)
AST: 21 U/L (ref 0–37)
Albumin: 3.5 g/dL (ref 3.5–5.2)
Alkaline Phosphatase: 55 U/L (ref 39–117)
BUN: 27 mg/dL — AB (ref 6–23)
CHLORIDE: 104 meq/L (ref 96–112)
CO2: 29 meq/L (ref 19–32)
Calcium: 9.4 mg/dL (ref 8.4–10.5)
Creat: 1.65 mg/dL — ABNORMAL HIGH (ref 0.50–1.10)
Glucose, Bld: 87 mg/dL (ref 70–99)
Potassium: 4.4 mEq/L (ref 3.5–5.3)
Sodium: 141 mEq/L (ref 135–145)
Total Bilirubin: 0.4 mg/dL (ref 0.2–1.2)
Total Protein: 6.8 g/dL (ref 6.0–8.3)

## 2015-06-01 NOTE — Telephone Encounter (Signed)
[name], we are going to treat with [name] and Ribavirin 1000mg 

## 2015-06-01 NOTE — Patient Instructions (Signed)
OV pending.  

## 2015-06-01 NOTE — Progress Notes (Addendum)
Subjective:    Patient ID: Colleen Nunez, female    DOB: 12/13/1955, 59 y.o.   MRN: [id number]  HPI Here today for f/u of her Hepatitis C. Received a noted from Dr. Valarie Nunez concerning Colleen Nunez. He wants our office to treat her before she has knee replacement. Doing okay. She is eating good. No weight loss. She rarely drinks now. She says will drink one can of beer. Hx of etoh abuse in the past.  Years ago she smoke cocaine.  No IV drugs. She thinks she may have contracted Hep C from multiple sex partners or a blood transfusion.  CBC CBC Latest Ref Rng 03/02/2015 08/30/2013 08/23/2013  WBC 4.0 - 10.5 K/uL 6.6 8.0 9.8  Hemoglobin 12.0 - 15.0 g/dL 10.7(L) 9.9(L) 9.6(L)  Hematocrit 36.0 - 46.0 % 33.1(L) 30.0(L) 28.5(L)  Platelets 150 - 400 K/uL 182 277 188    CMP Latest Ref Rng 03/02/2015 08/29/2013 08/29/2013  Glucose 70 - 99 mg/dL - 99 -  BUN 6 - 23 mg/dL - 17 -  Creatinine 0.50 - 1.10 mg/dL - 1.75(H) 1.71(H)  Sodium 135 - 145 mEq/L - 134(L) -  Potassium 3.5 - 5.1 mEq/L - 3.6 -  Chloride 96 - 112 mEq/L - 97 -  CO2 19 - 32 mEq/L - 27 -  Calcium 8.4 - 10.5 mg/dL - 9.3 -  Total Protein 6.0 - 8.3 g/dL 7.1 6.9 -  Total Bilirubin 0.2 - 1.2 mg/dL 0.3 0.4 -  Alkaline Phos 39 - 117 U/L 78 59 -  AST 0 - 37 U/L 26 23 -  ALT 0 - 35 U/L 18 15 -   Hepatic Function Panel     Component Value Date/Time   PROT 7.1 03/02/2015 0826   ALBUMIN 3.7 03/02/2015 0826   AST 26 03/02/2015 0826   ALT 18 03/02/2015 0826   ALKPHOS 78 03/02/2015 0826   BILITOT 0.3 03/02/2015 0826   BILIDIR 0.1 03/02/2015 0826   IBILI 0.2 03/02/2015 0826         Genotype 1A. She has a viral load.  02/2015: [place] elastrography F0-F1    12/28/2014 Hep C Ab reactive. Hep B surface Ag negative. ANA negative    02/2015 AFP 5.7  Review of Systems Past Medical History  Diagnosis Date  . Hypertension   . Arthritis   . Hepatitis C antibody test positive        . Renal insufficiency   . Anxiety   .  Depression     h/o suicide attempts in the past  . GERD (gastroesophageal reflux disease)   . Polysubstance abuse     h/o  . Anemia   . Gout     Past Surgical History  Procedure Laterality Date  . Knee arthroscopy    . Abdominal hysterectomy    . Cartilage removal      left knee  . Anterior cervical corpectomy N/A 06/09/2013    Procedure: C4 C5 Corpectomy with C6-7 Anterior cervical fusion/Peek cage/Trestle plate;  Surgeon: Otilio Connors, MD;  Location: [place] [facility];  Service: Neurosurgery;  Laterality: N/A;  Cervical Four, Cervical Five Corpectomy and Cervical six-seven Anterior cervical fusion/Peek cage Three-Five /Trestle Plating Cervical Three to Cervical Seven  . X-stop implantation    . Bilateral soo and appendectomy  2006  . Anterior cervical decomp/discectomy fusion N/A 08/12/2013    Procedure: Repair of Anterior  Cervical CSF LEAK, lumbar drain placement.;  Surgeon: Floyce Stakes, MD;  Location: [place];  Service: Neurosurgery;  Laterality: N/A;    Allergies  Allergen Reactions  . Aspirin     REACTION: Hx of overdose on this years ago - too scared to take. Tried to committ suicide.    Current Outpatient Prescriptions on File Prior to Visit  Medication Sig Dispense Refill  . amLODipine (NORVASC) 10 MG tablet Take 0.5 tablets (5 mg total) by mouth daily. (Patient taking differently: Take 10 mg by mouth 2 (two) times daily. )    . Cholecalciferol (VITAMIN D-3) 1000 UNITS CAPS Take by mouth.    . colchicine 0.6 MG tablet Take 1 tablet (0.6 mg total) by mouth 2 (two) times daily. (Patient taking differently: Take 0.6 mg by mouth as needed. ) 60 tablet 0  . diclofenac sodium (VOLTAREN) 1 % GEL Apply 2 g topically 4 (four) times daily. To Left knee for arthritis. 4 Tube 1  . iron polysaccharides (NIFEREX) 150 MG capsule Take 150 mg by mouth daily.     No current facility-administered medications on file prior to visit.        Objective:   Physical ExamBlood pressure  112/80, pulse 84, temperature 97.4 F (36.3 C), height 5\' 2"  (1.575 m), weight 157 lb 4.8 oz (71.351 kg).  Alert and oriented. Skin warm and dry. Oral mucosa is moist.   . Sclera anicteric, conjunctivae is pink. Thyroid not enlarged. No cervical lymphadenopathy. Lungs clear. Heart regular rate and rhythm.  Abdomen is soft. Bowel sounds are positive. No hepatomegaly. No abdominal masses felt. No tenderness.  No edema to lower extremities. Patient is alert and oriented.       Assessment & Plan:  Hepatitis C. She tells me she is rarely drinking. Am going to get a CMET on her today Am going to submit to [facility] for tx.

## 2015-06-06 NOTE — Telephone Encounter (Signed)
Per Karna Christmas - wait until discussed with Dr.Rehman as the patient is anemic.

## 2015-06-11 ENCOUNTER — Telehealth (INDEPENDENT_AMBULATORY_CARE_PROVIDER_SITE_OTHER): Payer: Self-pay | Admitting: Internal Medicine

## 2015-06-11 ENCOUNTER — Telehealth (INDEPENDENT_AMBULATORY_CARE_PROVIDER_SITE_OTHER): Payer: Self-pay | Admitting: *Deleted

## 2015-06-11 ENCOUNTER — Other Ambulatory Visit (INDEPENDENT_AMBULATORY_CARE_PROVIDER_SITE_OTHER): Payer: Self-pay | Admitting: *Deleted

## 2015-06-11 DIAGNOSIS — D649 Anemia, unspecified: Secondary | ICD-10-CM

## 2015-06-11 NOTE — Telephone Encounter (Signed)
Left message for patient to call me to scheduled TCS

## 2015-06-11 NOTE — Telephone Encounter (Signed)
Patient needs trilyte 

## 2015-06-11 NOTE — Telephone Encounter (Signed)
Her stool was guaiac negative today in office. I discussed this case with Dr. Laural Golden 06/08/2015 Diagnosis; anemia  Ann, please schedule a colonoscopy.

## 2015-06-11 NOTE — Telephone Encounter (Signed)
Colleen Nunez you can submit this to her insurance for Hpe C. Viekira and Ribavirin 1000mg 

## 2015-06-11 NOTE — Telephone Encounter (Signed)
TCS sch'd 07/04/15 at 100 (12), patient aware

## 2015-06-12 MED ORDER — PEG 3350-KCL-NA BICARB-NACL 420 G PO SOLR: 4000.0000 mL | Freq: Once | ORAL | Status: DC

## 2015-06-13 NOTE — Telephone Encounter (Signed)
Paper work has been completed for the PA process to begin. Once the clinicals have been release to be faxed to [facility]. It was also requested that once the patient is approved, that the medications be sent to our office per Provider Lelon Perla. Patient will be updated with the status of this PA as we are.

## 2015-06-27 ENCOUNTER — Telehealth (INDEPENDENT_AMBULATORY_CARE_PROVIDER_SITE_OTHER): Payer: Self-pay | Admitting: *Deleted

## 2015-06-27 NOTE — Telephone Encounter (Signed)
[facility] rec'd the information on Colleen Nunez today. The Pharmacist , Doroteo Bradford, called to say that there was a drug interaction, considered to be major. The [name] raises the blood level of the Colchicine very high.  There is a question if the patient may stop the Colchicine?  The Pharmacist would appreciate a call back from the Provider to discuss this. Call back number is [phone]

## 2015-06-29 NOTE — Telephone Encounter (Signed)
I have talked with Colleen Nunez. She has not taken the Colchicine in a while. I advised her, if her Hep C medication was approved she could not take the Colchicine and she agreed.

## 2015-06-29 NOTE — Telephone Encounter (Signed)
I have spoken with pharmacy. I let them know that Colleen Nunez will not take the Colchicine while she is taking the Hep C medication. Patient has not taken the medication in over a year.

## 2015-06-30 ENCOUNTER — Emergency Department (HOSPITAL_COMMUNITY)
Admission: EM | Admit: 2015-06-30 | Discharge: 2015-06-30 | Disposition: A | Discharge status: Home / Self Care | Payer: Medicaid Other | Attending: Emergency Medicine | Admitting: Emergency Medicine

## 2015-06-30 ENCOUNTER — Encounter (HOSPITAL_COMMUNITY): Payer: Self-pay | Admitting: *Deleted

## 2015-06-30 DIAGNOSIS — Z87448 Personal history of other diseases of urinary system: Secondary | ICD-10-CM | POA: Diagnosis not present

## 2015-06-30 DIAGNOSIS — Z862 Personal history of diseases of the blood and blood-forming organs and certain disorders involving the immune mechanism: Secondary | ICD-10-CM | POA: Diagnosis not present

## 2015-06-30 DIAGNOSIS — F419 Anxiety disorder, unspecified: Secondary | ICD-10-CM | POA: Diagnosis not present

## 2015-06-30 DIAGNOSIS — M7989 Other specified soft tissue disorders: Secondary | ICD-10-CM | POA: Diagnosis present

## 2015-06-30 DIAGNOSIS — Z8619 Personal history of other infectious and parasitic diseases: Secondary | ICD-10-CM | POA: Insufficient documentation

## 2015-06-30 DIAGNOSIS — F329 Major depressive disorder, single episode, unspecified: Secondary | ICD-10-CM | POA: Insufficient documentation

## 2015-06-30 DIAGNOSIS — I10 Essential (primary) hypertension: Secondary | ICD-10-CM | POA: Diagnosis not present

## 2015-06-30 DIAGNOSIS — Z79899 Other long term (current) drug therapy: Secondary | ICD-10-CM | POA: Insufficient documentation

## 2015-06-30 DIAGNOSIS — M79605 Pain in left leg: Secondary | ICD-10-CM | POA: Diagnosis not present

## 2015-06-30 DIAGNOSIS — Z8719 Personal history of other diseases of the digestive system: Secondary | ICD-10-CM | POA: Insufficient documentation

## 2015-06-30 MED ORDER — ENOXAPARIN SODIUM 80 MG/0.8ML ~~LOC~~ SOLN
1.0000 mg/kg | Freq: Once | SUBCUTANEOUS | Status: AC
  Administered 2015-06-30: 70 mg via SUBCUTANEOUS
  Filled 2015-06-30: qty 0.8

## 2015-06-30 MED ORDER — TRAMADOL HCL 50 MG PO TABS: 50.0000 mg | ORAL_TABLET | Freq: Two times a day (BID) | ORAL | Status: DC | PRN

## 2015-06-30 MED ORDER — TRAMADOL HCL 50 MG PO TABS
50.0000 mg | ORAL_TABLET | Freq: Once | ORAL | Status: AC
  Administered 2015-06-30: 50 mg via ORAL
  Filled 2015-06-30: qty 1

## 2015-06-30 NOTE — ED Notes (Signed)
Pt states she came in today due to swelling to left foot.

## 2015-06-30 NOTE — ED Provider Notes (Signed)
CSN: [id number]     Arrival date & time 06/30/15  1101 History  This chart was scribed for Merrily Pew, MD by Stephania Fragmin, ED Scribe. This patient was seen in room APA11/APA11 and the patient's care was started at 12:47 PM.    Chief Complaint  Patient presents with  . Leg Swelling   The history is provided by the patient. No language interpreter was used.    HPI Comments: ANJOLIE MCWHINNEY is a 59 y.o. female with a history of arthritis and goat, who presents to the Emergency Department complaining of constant, moderate, atraumatic swelling in her LLE from her knee to her foot, with associated pain and "tightness" from the swelling. She notes she is scheduled for an upcoming left knee arthroplastic surgery. Patient states she has recently been walking more, with assistance of a brace, to see her sister in the hospital and to use the restroom. She has elevated her leg with some relief. She has also taken Tylenol, but it provided no relief to her swelling. She denies a history of blood clots. She denies any erythema or recent trauma.    Past Medical History  Diagnosis Date  . Hypertension   . Arthritis   . Hepatitis C antibody test positive        . Renal insufficiency   . Anxiety   . Depression     h/o suicide attempts in the past  . GERD (gastroesophageal reflux disease)   . Polysubstance abuse     h/o  . Anemia   . Gout    Past Surgical History  Procedure Laterality Date  . Knee arthroscopy    . Abdominal hysterectomy    . Cartilage removal      left knee  . Anterior cervical corpectomy N/A 06/09/2013    Procedure: C4 C5 Corpectomy with C6-7 Anterior cervical fusion/Peek cage/Trestle plate;  Surgeon: Otilio Connors, MD;  Location: [place] [facility];  Service: Neurosurgery;  Laterality: N/A;  Cervical Four, Cervical Five Corpectomy and Cervical six-seven Anterior cervical fusion/Peek cage Three-Five /Trestle Plating Cervical Three to Cervical Seven  . X-stop implantation    . Bilateral  soo and appendectomy  2006  . Anterior cervical decomp/discectomy fusion N/A 08/12/2013    Procedure: Repair of Anterior  Cervical CSF LEAK, lumbar drain placement.;  Surgeon: Floyce Stakes, MD;  Location: [place];  Service: Neurosurgery;  Laterality: N/A;   Family History  Problem Relation Age of Onset  . Diabetes Sister   . Diabetes Mother     deceased age 77  . Kidney failure Mother   . Alcohol abuse Father   . Colon cancer Neg Hx    Social History  Substance Use Topics  . Smoking status: Never Smoker   . Smokeless tobacco: None  . Alcohol Use: 1.8 oz/week    3 Cans of beer per week   OB History    No data available     Review of Systems  Cardiovascular: Positive for leg swelling.  Musculoskeletal: Positive for myalgias (due to left leg swelling).  Skin: Negative for color change.  All other systems reviewed and are negative.   Allergies  Aspirin and Oxycodone  Home Medications   Prior to Admission medications   Medication Sig Start Date End Date Taking? Authorizing Provider  acetaminophen (TYLENOL) 500 MG tablet Take 500 mg by mouth every 8 (eight) hours as needed.   Yes Historical Provider, MD  amLODipine (NORVASC) 5 MG tablet Take 5 mg by mouth  daily.   Yes Historical Provider, MD  amLODipine (NORVASC) 10 MG tablet Take 0.5 tablets (5 mg total) by mouth daily. Patient not taking: Reported on 06/30/2015 09/02/13   Ivan Anchors Love, PA-C  colchicine 0.6 MG tablet Take 1 tablet (0.6 mg total) by mouth 2 (two) times daily. Patient not taking: Reported on 06/30/2015 09/06/13   Meredith Staggers, MD  diclofenac sodium (VOLTAREN) 1 % GEL Apply 2 g topically 4 (four) times daily. To Left knee for arthritis. Patient not taking: Reported on 06/30/2015 09/02/13   Ivan Anchors Love, PA-C  polyethylene glycol-electrolytes (NULYTELY/GOLYTELY) 420 G solution Take 4,000 mLs by mouth once. 06/12/15   Butch Penny, NP  traMADol (ULTRAM) 50 MG tablet Take 1 tablet (50 mg total) by mouth every  12 (twelve) hours as needed for severe pain. 06/30/15   Corene Cornea Shermaine Rivet, MD   BP 183/102 mmHg  Pulse 60  Temp(Src) 97.6 F (36.4 C) (Oral)  Resp 18  Ht 5\' 2"  (1.575 m)  Wt 155 lb (70.308 kg)  BMI 28.34 kg/m2  SpO2 100% Physical Exam  Constitutional: She is oriented to person, place, and time. She appears well-developed and well-nourished. No distress.  No distress  HENT:  Head: Normocephalic and atraumatic.  Eyes: Conjunctivae and EOM are normal.  Neck: Neck supple. No tracheal deviation present.  Cardiovascular: Normal rate, regular rhythm and normal heart sounds.  Exam reveals no gallop and no friction rub.   No murmur heard. Pulmonary/Chest: Effort normal and breath sounds normal. No respiratory distress. She has no wheezes. She has no rales. She exhibits no tenderness.  Musculoskeletal: Normal range of motion. She exhibits edema.  Mild edema to LLE. 1+ DP pulses on left and right. No palpable masses behind left knee. No rash or warmth noted. Not ballotable compared to right knee.   Neurological: She is alert and oriented to person, place, and time.  Skin: Skin is warm and dry. No rash noted.  Psychiatric: She has a normal mood and affect. Her behavior is normal.  Nursing note and vitals reviewed.   ED Course  Procedures (including critical care time)  DIAGNOSTIC STUDIES: Oxygen Saturation is 100% on RA, normal by my interpretation.    COORDINATION OF CARE: 12:50 PM - Discussed treatment plan with pt at bedside which includes U/S to r/o blood clots and one dose of pain medication administered here. Pt verbalized understanding and agreed to plan.   MDM   Final diagnoses:  Leg pain, left  Left leg swelling    59 yo F w/ LLE edema without trauma but with increased activity. Notes swelling and pain associated with it, improves with elevation. No h/o same. No e/o cellulitis, septic knee, bursitis or bakers cyst. Can bear weight with baseline level of pain, doubt new fracture.  Possibly DVT, will order outpatient [place] and give shot of lovenox while awaiting results. Patient counseled on plan and will return tomorrow for [place].    I personally performed the services described in this documentation, which was scribed in my presence. The recorded information has been reviewed and is accurate.      Merrily Pew, MD 07/01/15 1335

## 2015-07-01 ENCOUNTER — Inpatient Hospital Stay (HOSPITAL_COMMUNITY): Admit: 2015-07-01 | Payer: Medicaid Other

## 2015-07-02 ENCOUNTER — Ambulatory Visit (HOSPITAL_COMMUNITY)
Admission: RE | Admit: 2015-07-02 | Discharge: 2015-07-02 | Disposition: A | Discharge status: Home / Self Care | Payer: Medicaid Other | Source: Ambulatory Visit | Attending: Emergency Medicine | Admitting: Emergency Medicine

## 2015-07-02 DIAGNOSIS — R609 Edema, unspecified: Secondary | ICD-10-CM | POA: Insufficient documentation

## 2015-07-02 DIAGNOSIS — M7989 Other specified soft tissue disorders: Secondary | ICD-10-CM | POA: Diagnosis not present

## 2015-07-04 ENCOUNTER — Encounter (HOSPITAL_COMMUNITY): Payer: Self-pay | Admitting: *Deleted

## 2015-07-04 ENCOUNTER — Ambulatory Visit (HOSPITAL_COMMUNITY)
Admission: RE | Admit: 2015-07-04 | Discharge: 2015-07-04 | Disposition: A | Discharge status: Home / Self Care | Payer: Medicaid Other | Source: Ambulatory Visit | Attending: Internal Medicine | Admitting: Internal Medicine

## 2015-07-04 ENCOUNTER — Encounter (HOSPITAL_COMMUNITY)
Admission: RE | Disposition: A | Discharge status: Home / Self Care | Payer: Self-pay | Source: Ambulatory Visit | Attending: Internal Medicine

## 2015-07-04 DIAGNOSIS — M1990 Unspecified osteoarthritis, unspecified site: Secondary | ICD-10-CM | POA: Diagnosis not present

## 2015-07-04 DIAGNOSIS — K5730 Diverticulosis of large intestine without perforation or abscess without bleeding: Secondary | ICD-10-CM

## 2015-07-04 DIAGNOSIS — I10 Essential (primary) hypertension: Secondary | ICD-10-CM | POA: Diagnosis not present

## 2015-07-04 DIAGNOSIS — F418 Other specified anxiety disorders: Secondary | ICD-10-CM | POA: Diagnosis not present

## 2015-07-04 DIAGNOSIS — Z79899 Other long term (current) drug therapy: Secondary | ICD-10-CM | POA: Diagnosis not present

## 2015-07-04 DIAGNOSIS — Z791 Long term (current) use of non-steroidal anti-inflammatories (NSAID): Secondary | ICD-10-CM | POA: Insufficient documentation

## 2015-07-04 DIAGNOSIS — D120 Benign neoplasm of cecum: Secondary | ICD-10-CM | POA: Diagnosis not present

## 2015-07-04 DIAGNOSIS — K635 Polyp of colon: Secondary | ICD-10-CM | POA: Diagnosis not present

## 2015-07-04 DIAGNOSIS — Z1211 Encounter for screening for malignant neoplasm of colon: Secondary | ICD-10-CM | POA: Insufficient documentation

## 2015-07-04 DIAGNOSIS — D649 Anemia, unspecified: Secondary | ICD-10-CM

## 2015-07-04 HISTORY — PX: COLONOSCOPY: SHX5424

## 2015-07-04 SURGERY — COLONOSCOPY
Anesthesia: Moderate Sedation

## 2015-07-04 MED ORDER — MIDAZOLAM HCL 5 MG/5ML IJ SOLN
INTRAMUSCULAR | Status: DC | PRN
  Administered 2015-07-04 (×2): 2 mg via INTRAVENOUS

## 2015-07-04 MED ORDER — MEPERIDINE HCL 50 MG/ML IJ SOLN
INTRAMUSCULAR | Status: DC
Start: 2015-07-04 — End: 2015-07-04
  Filled 2015-07-04: qty 1

## 2015-07-04 MED ORDER — MIDAZOLAM HCL 5 MG/5ML IJ SOLN
INTRAMUSCULAR | Status: AC
  Filled 2015-07-04: qty 10

## 2015-07-04 MED ORDER — MEPERIDINE HCL 50 MG/ML IJ SOLN
INTRAMUSCULAR | Status: DC | PRN
  Administered 2015-07-04 (×2): 25 mg via INTRAVENOUS

## 2015-07-04 MED ORDER — SODIUM CHLORIDE 0.9 % IV SOLN
INTRAVENOUS | Status: DC
  Administered 2015-07-04: 13:00:00 via INTRAVENOUS

## 2015-07-04 MED ORDER — SIMETHICONE 40 MG/0.6ML PO SUSP
ORAL | Status: DC | PRN
  Administered 2015-07-04: 14:00:00

## 2015-07-04 NOTE — H&P (Signed)
Colleen Nunez is an 59 y.o. female.   Chief Complaint: Patient is here for colonoscopy. HPI: Patient is 59 year old African-American female was here for screening colonoscopy. She denies abdominal pain rectal bleeding or melena. She has history of constipation and uses OTC prep on as-needed basis. She was recently evaluated for hepatitis C. She was noted to have mild anemia. Stool is guaiac negative. She has never been screened for CRC before. Family streaky negative for CRC.  Past Medical History  Diagnosis Date  . Hypertension   . Arthritis   . Hepatitis C antibody test positive        . Renal insufficiency   . Anxiety   . Depression     h/o suicide attempts in the past  . GERD (gastroesophageal reflux disease)   . Polysubstance abuse     h/o  . Anemia   . Gout     Past Surgical History  Procedure Laterality Date  . Knee arthroscopy    . Abdominal hysterectomy    . Cartilage removal      left knee  . Anterior cervical corpectomy N/A 06/09/2013    Procedure: C4 C5 Corpectomy with C6-7 Anterior cervical fusion/Peek cage/Trestle plate;  Surgeon: Otilio Connors, MD;  Location: [facility];  Service: Neurosurgery;  Laterality: N/A;  Cervical Four, Cervical Five Corpectomy and Cervical six-seven Anterior cervical fusion/Peek cage Three-Five /Trestle Plating Cervical Three to Cervical Seven  . X-stop implantation    . Bilateral soo and appendectomy  2006  . Anterior cervical decomp/discectomy fusion N/A 08/12/2013    Procedure: Repair of Anterior  Cervical CSF LEAK, lumbar drain placement.;  Surgeon: Floyce Stakes, MD;  Location: [facility];  Service: Neurosurgery;  Laterality: N/A;    Family History  Problem Relation Age of Onset  . Diabetes Sister   . Diabetes Mother     deceased age 15  . Kidney failure Mother   . Alcohol abuse Father   . Colon cancer Neg Hx    Social History:  reports that she has never smoked. She does not have any smokeless tobacco history on file. She  reports that she drinks about 1.8 oz of alcohol per week. She reports that she does not use illicit drugs.  Allergies:  Allergies  Allergen Reactions  . Aspirin     REACTION: Hx of overdose on this years ago - too scared to take. Tried to committ suicide.  . Oxycodone Nausea And Vomiting    Medications Prior to Admission  Medication Sig Dispense Refill  . acetaminophen (TYLENOL) 500 MG tablet Take 500 mg by mouth every 8 (eight) hours as needed.    Marland Kitchen amLODipine (NORVASC) 10 MG tablet Take 0.5 tablets (5 mg total) by mouth daily.    . polyethylene glycol-electrolytes (NULYTELY/GOLYTELY) 420 G solution Take 4,000 mLs by mouth once. 4000 mL 0  . amLODipine (NORVASC) 5 MG tablet Take 5 mg by mouth daily.    . colchicine 0.6 MG tablet Take 1 tablet (0.6 mg total) by mouth 2 (two) times daily. (Patient not taking: Reported on 06/30/2015) 60 tablet 0  . diclofenac sodium (VOLTAREN) 1 % GEL Apply 2 g topically 4 (four) times daily. To Left knee for arthritis. (Patient not taking: Reported on 06/30/2015) 4 Tube 1  . traMADol (ULTRAM) 50 MG tablet Take 1 tablet (50 mg total) by mouth every 12 (twelve) hours as needed for severe pain. 10 tablet 0     ROS  Blood pressure 134/93, pulse 97,  temperature 98.6 F (37 C), temperature source Oral, resp. rate 17, height 5\' 2"  (1.575 m), weight 154 lb (69.854 kg), SpO2 100 %. Physical Exam  Constitutional: She appears well-developed and well-nourished.  HENT:  Mouth/Throat: Oropharynx is clear and moist.  Eyes: Conjunctivae are normal. No scleral icterus.  Neck: No thyromegaly present.  Cardiovascular: Normal rate, regular rhythm and normal heart sounds.   Murmur: faint systolic murmur at left sternal border. Respiratory: Effort normal and breath sounds normal.  GI: Soft. She exhibits no distension and no mass. There is no tenderness.  Musculoskeletal: She exhibits no edema.  Lymphadenopathy:    She has no cervical adenopathy.  Neurological: She is  alert.  Skin: Skin is warm and dry.     Assessment/Plan Average risk screening colonoscopy.  Pheng Prokop U 07/04/2015, 2:05 PM

## 2015-07-04 NOTE — Op Note (Signed)
COLONOSCOPY PROCEDURE REPORT  PATIENT:  Colleen Nunez  MR#:  [id number] Birthdate:  [birth year], 59 y.o., female Endoscopist:  Dr. Rogene Houston, MD Referred By:  Dr. Rosita Fire, MD Procedure Date: 07/04/2015  Procedure:   Colonoscopy  Indications:  Patient is 59 year old African-American female was undergoing average risk screening colonoscopy. This is patient's first exam. She has mild anemia stool is guaiac negative.  Informed Consent:  The procedure and risks were reviewed with the patient and informed consent was obtained.  Medications:  Demerol 50 mg IV Versed 4 mg IV  Description of procedure:  After a digital rectal exam was performed, that colonoscope was advanced from the anus through the rectum and colon to the area of the cecum, ileocecal valve and appendiceal orifice. The cecum was deeply intubated. These structures were well-seen and photographed for the record. From the level of the cecum and ileocecal valve, the scope was slowly and cautiously withdrawn. The mucosal surfaces were carefully surveyed utilizing scope tip to flexion to facilitate fold flattening as needed. The scope was pulled down into the rectum where a thorough exam including retroflexion was performed.  Findings:   Prep satisfactory. 3 mm cecal polyp ablated via cold biopsy. Few small diverticula scattered throughout the colon. Normal rectal mucosa and anal rectal junction.   Therapeutic/Diagnostic Maneuvers Performed:  See above  Complications:  None  Cecal Withdrawal Time:  10 minutes  Impression:  Examination performed to cecum. Small cecal polyp ablated via cold biopsy. Few scattered diverticula throughout the colon.  Recommendations:  Standard instructions given. High fiber diet. I will contact patient with biopsy results and further recommendations.  REHMAN,NAJEEB U  07/04/2015 2:47 PM  CC: Dr. Rosita Fire, MD & Dr. Rayne Du ref. provider found

## 2015-07-04 NOTE — Discharge Instructions (Signed)
Resume usual medications and high fiber diet. ° No driving for 24 hours. ° Physician will call with biopsy results °\ ° ° °Colonoscopy, Care After °These instructions give you information on caring for yourself after your procedure. Your doctor may also give you more specific instructions. Call your doctor if you have any problems or questions after your procedure. °HOME CARE °· Do not drive for 24 hours. °· Do not sign important papers or use machinery for 24 hours. °· You may shower. °· You may go back to your usual activities, but go slower for the first 24 hours. °· Take rest breaks often during the first 24 hours. °· Walk around or use warm packs on your belly (abdomen) if you have belly cramping or gas. °· Drink enough fluids to keep your pee (urine) clear or pale yellow. °· Resume your normal diet. Avoid heavy or fried foods. °· Avoid drinking alcohol for 24 hours or as told by your doctor. °· Only take medicines as told by your doctor. °If a tissue sample (biopsy) was taken during the procedure:  °· Do not take aspirin or blood thinners for 7 days, or as told by your doctor. °· Do not drink alcohol for 7 days, or as told by your doctor. °· Eat soft foods for the first 24 hours. °GET HELP IF: °You still have a small amount of blood in your poop (stool) 2-3 days after the procedure. °GET HELP RIGHT AWAY IF: °· You have more than a small amount of blood in your poop. °· You see clumps of tissue (blood clots) in your poop. °· Your belly is puffy (swollen). °· You feel sick to your stomach (nauseous) or throw up (vomit). °· You have a fever. °· You have belly pain that gets worse and medicine does not help. °MAKE SURE YOU: °· Understand these instructions. °· Will watch your condition. °· Will get help right away if you are not doing well or get worse. °Document Released: 12/06/2010 Document Revised: 11/08/2013 Document Reviewed: 07/11/2013 °ExitCare® Patient Information ©2015 [employer]. This information is  not intended to replace advice given to you by your health care provider. Make sure you discuss any questions you have with your health care provider. ° °High-Fiber Diet °Fiber is found in fruits, vegetables, and grains. A high-fiber diet encourages the addition of more whole grains, legumes, fruits, and vegetables in your diet. The recommended amount of fiber for adult males is 38 g per day. For adult females, it is 25 g per day. Pregnant and lactating women should get 28 g of fiber per day. If you have a digestive or bowel problem, ask your caregiver for advice before adding high-fiber foods to your diet. Eat a variety of high-fiber foods instead of only a select few type of foods.  °PURPOSE °· To increase stool bulk. °· To make bowel movements more regular to prevent constipation. °· To lower cholesterol. °· To prevent overeating. °WHEN IS THIS DIET USED? °· It may be used if you have constipation and hemorrhoids. °· It may be used if you have uncomplicated diverticulosis (intestine condition) and irritable bowel syndrome. °· It may be used if you need help with weight management. °· It may be used if you want to add it to your diet as a protective measure against atherosclerosis, diabetes, and cancer. °SOURCES OF FIBER °· Whole-grain breads and cereals. °· Fruits, such as apples, oranges, bananas, berries, prunes, and pears. °· Vegetables, such as green peas, carrots, sweet potatoes,   spinach, and artichokes.  Legumes, such split peas, soy, lentils.  Almonds. FIBER CONTENT IN FOODS Starches and Grains / Dietary Fiber (g)  Cheerios, 1 cup / 3 g  Corn Flakes cereal, 1 cup / 0.7 g  Rice crispy treat cereal, 1 cup / 0.3 g  Instant oatmeal (cooked),  cup / 2 g  Frosted wheat cereal, 1 cup / 5.1 g  Brown, long-grain rice (cooked), 1 cup / 3.5 g  White, long-grain rice (cooked), 1 cup / 0.6 g  Enriched macaroni (cooked), 1 cup / 2.5 g Legumes / Dietary Fiber (g)  Baked  beans (canned, plain, or vegetarian),  cup / 5.2 g  Kidney beans (canned),  cup / 6.8 g  Pinto beans (cooked),  cup / 5.5 g Breads and Crackers / Dietary Fiber (g)  Plain or honey graham crackers, 2 squares / 0.7 g  Saltine crackers, 3 squares / 0.3 g  Plain, salted pretzels, 10 pieces / 1.8 g  Whole-wheat bread, 1 slice / 1.9 g  White bread, 1 slice / 0.7 g  Raisin bread, 1 slice / 1.2 g  Plain bagel, 3 oz / 2 g  Flour tortilla, 1 oz / 0.9 g  Corn tortilla, 1 small / 1.5 g  Hamburger or hotdog bun, 1 small / 0.9 g Fruits / Dietary Fiber (g)  Apple with skin, 1 medium / 4.4 g  Sweetened applesauce,  cup / 1.5 g  Banana,  medium / 1.5 g  Grapes, 10 grapes / 0.4 g  Orange, 1 small / 2.3 g  Raisin, 1.5 oz / 1.6 g  Melon, 1 cup / 1.4 g Vegetables / Dietary Fiber (g)  Green beans (canned),  cup / 1.3 g  Carrots (cooked),  cup / 2.3 g  Broccoli (cooked),  cup / 2.8 g  Peas (cooked),  cup / 4.4 g  Mashed potatoes,  cup / 1.6 g  Lettuce, 1 cup / 0.5 g  Corn (canned),  cup / 1.6 g  Tomato,  cup / 1.1 g Document Released: 11/03/2005 Document Revised: 05/04/2012 Document Reviewed: 02/05/2012 ExitCare Patient Information 2015 [place]. This information is not intended to replace advice given to you by your health care provider. Make sure you discuss any questions you have with your health care provider.

## 2015-07-12 ENCOUNTER — Encounter (HOSPITAL_COMMUNITY): Payer: Self-pay | Admitting: Internal Medicine

## 2015-07-25 ENCOUNTER — Telehealth (INDEPENDENT_AMBULATORY_CARE_PROVIDER_SITE_OTHER): Payer: Self-pay | Admitting: Internal Medicine

## 2015-07-25 NOTE — Telephone Encounter (Signed)
Colleen Nunez, Patient have knee replacement 08/15/2015. Have her medication delivered at office. I am going to wait before giving it to her because she will need rehab.

## 2015-07-26 ENCOUNTER — Ambulatory Visit (INDEPENDENT_AMBULATORY_CARE_PROVIDER_SITE_OTHER): Payer: Medicaid Other | Admitting: Internal Medicine

## 2015-07-26 NOTE — Telephone Encounter (Signed)
I have sent a message to [facility] with [place]. I ask that they hold shipment of medications  Until patient has had knee Replacement and Rehab. We will let them know.

## 2015-08-06 DIAGNOSIS — D649 Anemia, unspecified: Secondary | ICD-10-CM | POA: Insufficient documentation

## 2015-08-06 DIAGNOSIS — N183 Chronic kidney disease, stage 3 unspecified: Secondary | ICD-10-CM | POA: Insufficient documentation

## 2015-08-15 DIAGNOSIS — Z96652 Presence of left artificial knee joint: Secondary | ICD-10-CM | POA: Insufficient documentation

## 2015-09-06 ENCOUNTER — Ambulatory Visit (HOSPITAL_COMMUNITY): Payer: Medicaid Other | Attending: Orthopedic Surgery | Admitting: Physical Therapy

## 2015-09-06 DIAGNOSIS — R269 Unspecified abnormalities of gait and mobility: Secondary | ICD-10-CM | POA: Insufficient documentation

## 2015-09-06 DIAGNOSIS — R262 Difficulty in walking, not elsewhere classified: Secondary | ICD-10-CM | POA: Insufficient documentation

## 2015-09-06 DIAGNOSIS — M25662 Stiffness of left knee, not elsewhere classified: Secondary | ICD-10-CM | POA: Diagnosis present

## 2015-09-06 DIAGNOSIS — R29898 Other symptoms and signs involving the musculoskeletal system: Secondary | ICD-10-CM | POA: Diagnosis present

## 2015-09-06 DIAGNOSIS — R2689 Other abnormalities of gait and mobility: Secondary | ICD-10-CM | POA: Insufficient documentation

## 2015-09-06 NOTE — Therapy (Signed)
[facility] [place], [place], [address] Phone: [phone]   Fax:  [phone]  Physical Therapy Evaluation  Patient Details  Name: Colleen Nunez MRN: [id number] Date of Birth: 1966-09-11 Referring Provider: Eden Lathe  Encounter Date: 09/06/2015      PT End of Session - 09/06/15 1155    Visit Number 1   Number of Visits 8   Date for PT Re-Evaluation 10/06/15   Authorization Type medicaid   PT Start Time 1110   PT Stop Time 1155   PT Time Calculation (min) 45 min   Activity Tolerance Patient tolerated treatment well   Behavior During Therapy [facility] for tasks assessed/performed      Past Medical History  Diagnosis Date  . Hypertension   . Arthritis   . Hepatitis C antibody test positive        . Renal insufficiency   . Anxiety   . Depression     h/o suicide attempts in the past  . GERD (gastroesophageal reflux disease)   . Polysubstance abuse     h/o  . Anemia   . Gout     Past Surgical History  Procedure Laterality Date  . Knee arthroscopy    . Abdominal hysterectomy    . Cartilage removal      left knee  . Anterior cervical corpectomy N/A 06/09/2013    Procedure: C4 C5 Corpectomy with C6-7 Anterior cervical fusion/Peek cage/Trestle plate;  Surgeon: Otilio Connors, MD;  Location: [facility];  Service: Neurosurgery;  Laterality: N/A;  Cervical Four, Cervical Five Corpectomy and Cervical six-seven Anterior cervical fusion/Peek cage Three-Five /Trestle Plating Cervical Three to Cervical Seven  . X-stop implantation    . Bilateral soo and appendectomy  2006  . Anterior cervical decomp/discectomy fusion N/A 08/12/2013    Procedure: Repair of Anterior  Cervical CSF LEAK, lumbar drain placement.;  Surgeon: Floyce Stakes, MD;  Location: [facility];  Service: Neurosurgery;  Laterality: N/A;  . Colonoscopy N/A 07/04/2015    Procedure: COLONOSCOPY;  Surgeon: Rogene Houston, MD;  [facility];  Service:  Endoscopy;  Laterality: N/A;  100    There were no vitals filed for this visit.  Visit Diagnosis:  Abnormality of gait  Difficulty walking  Left leg weakness  Unstable balance  Stiffness of left knee      Subjective Assessment - 09/06/15 1113    Subjective Ma. Libbert states that she had a partial knee replacement on her Lt knee on 08/15/2015.  She stayed in the hospital until 08/18/2015.  She states she has had no home health service.  She is currently using a walker to walk with.  She has been referred to physical therapy to maximize her functional ability.    Pertinent History HTN,    Limitations Sitting;Standing;Walking   How long can you sit comfortably? an hour    How long can you stand comfortably? able to stand for an hour now.    How long can you walk comfortably? walking with a walker for 10 minutes    Patient Stated Goals to walk better would like to be able to walk with a cane.    Currently in Pain? Yes  worst is a 8/10    Pain Score 1    Pain Location Knee   Pain Orientation Left   Pain Descriptors / Indicators Aching   Pain Onset More than a month ago   Pain Frequency Intermittent   Aggravating  Factors  activity   Pain Relieving Factors rest             [facility] PT Assessment - 09/06/15 1120    Assessment   Medical Diagnosis Lt partial knee replacement   Referring Provider [place]   Onset Date/Surgical Date 08/15/15   Next MD Visit 09/25/2015   Prior Therapy acute   Precautions   Precautions None   Restrictions   Weight Bearing Restrictions No   Balance Screen   Has the patient fallen in the past 6 months No   Has the patient had a decrease in activity level because of a fear of falling?  No   Is the patient reluctant to leave their home because of a fear of falling?  No   Home Ecologist residence   Home Access Stairs to enter   Entrance Stairs-Number of Steps [place] One level   Prior Function   Level of  Independence Independent with household mobility with device   Vocation Unemployed   Leisure walking    Cognition   Overall Cognitive Status Within Functional Limits for tasks assessed   Observation/Other Assessments   Focus on Therapeutic Outcomes (FOTO)  45   Functional Tests   Functional tests Single leg stance;Sit to Stand   Single Leg Stance   Comments Rt 3 seconds Lt 0 seconds    Sit to Stand   Comments 42.51 for 5 sit to stand    ROM / Strength   AROM / PROM / Strength AROM;Strength   AROM   AROM Assessment Site Knee   Right/Left Knee Left   Left Knee Extension 0   Left Knee Flexion 110   Strength   Strength Assessment Site Hip;Knee;Ankle   Right/Left Hip Right;Left   Right Hip Flexion 5/5   Right Hip Extension 3/5   Right Hip ABduction 4/5   Left Hip Flexion 5/5   Left Hip Extension 3/5   Left Hip ABduction 4-/5   Right/Left Knee Right;Left   Right Knee Flexion 3/5   Right Knee Extension 5/5   Left Knee Flexion 3/5   Left Knee Extension 3+/5   Right/Left Ankle Right;Left   Right Ankle Dorsiflexion 5/5   Left Ankle Dorsiflexion 5/5                           PT Education - 09/06/15 1153    Education provided Yes   Education Details HEP   Person(s) Educated Patient   Methods Explanation;Handout   Comprehension Verbalized understanding;Returned demonstration          PT Short Term Goals - 09/06/15 1204    PT SHORT TERM GOAL #1   Title 1 HEP   Time 1   Period Weeks   Status New   PT SHORT TERM GOAL #2   Title Pt to be able to walk with a cane in her home   Time 3   Period Weeks   Status New   PT SHORT TERM GOAL #3   Title Pt strength to be improve by 1/2 grade to be able to go up and down steps in a reciprocol manner   Time 3   Period Weeks           PT Long Term Goals - 09/06/15 1205    PT LONG TERM GOAL #1   Title I in advance HPE   Time 5   Period  Weeks   PT LONG TERM GOAL #2   Title Pt strength to be increased by  1 grade to be able to come sit to stand without difficulty   Time 5   Period Weeks   PT LONG TERM GOAL #3   Title Pt to feel comfortable walking outside without an assistive device for 20    Time 4   Period Weeks   PT LONG TERM GOAL #4   Title Pt to be able to SLS for 10 seconds to reduce risk of falling    Time 5   Period Weeks               Plan - 09/06/15 1155    Clinical Impression Statement Pt is a 59 yo female who has had a partial knee replacement on her Lt  knee.  She has been referred for skilled physcial therapy.  The pt is currently walking with a walker.  Examination demonstrates antalgic gait, decreased balance, decreased strength, decreased ROM and increased pain.  Ms. Fahl   Pt will benefit from skilled therapeutic intervention in order to improve on the following deficits Abnormal gait;Decreased activity tolerance;Decreased balance;Decreased endurance;Decreased range of motion;Decreased strength;Difficulty walking;Pain   Rehab Potential Good   PT Frequency 2x / week   PT Duration 4 weeks   PT Treatment/Interventions ADLs/Self Care Home Management;Stair training;Gait training;Functional mobility training;Therapeutic activities;Therapeutic exercise;Balance training;Patient/family education   PT Next Visit Plan Begin rockerboard, SLS, forward and lateral step ups, heel raises, mini-squats, Gt with cane, bridges    PT Home Exercise Plan given   Consulted and Agree with Plan of Care Patient           Problem List Patient Active Problem List   Diagnosis Date Noted  . Hepatitis C 03/02/2015  . Spondylosis, cervical, with myelopathy 09/21/2013  . Quadriplegia ([place]) 09/21/2013  . Osteoarthritis of left knee 09/21/2013  . Hypokalemia 08/16/2013  . AKI (acute kidney injury) ([place]) 08/16/2013  . UTI (lower urinary tract infection) 08/07/2013  . Nausea & vomiting 08/07/2013  . Chronic kidney disease, stage IV (severe) ([place]) 08/07/2013  . Cervical myelopathy ([place])  08/07/2013  . GERD 06/12/2009  . CHEST PAIN 06/12/2009  . LEG PAIN, BILATERAL 05/28/2009  . OBESITY 12/17/2007  . DYSPHAGIA UNSPECIFIED 12/03/2007  . ANEMIA, NORMOCYTIC 07/13/2007  . ALCOHOL ABUSE 07/13/2007  . RENAL DISEASE, CHRONIC, STAGE II 07/13/2007  . ANXIETY 06/29/2007  . DEPRESSION 06/29/2007  . HYPERTENSION 06/29/2007  . CONSTIPATION 06/29/2007  . ARTHRITIS 06/29/2007  . MALAISE AND FATIGUE 06/29/2007    Rayetta Humphrey, PT CLT [phone]  09/06/2015, 12:12 PM  [facility] [address] [place], [place], [address] Phone: [phone]   Fax:  [phone]  Name: HABIBA CORNELIOUS MRN: [id number] Date of Birth: [birth year]

## 2015-09-06 NOTE — Patient Instructions (Addendum)
  Copyright  VHI. All rights reserved.  Knee Extension (Sitting)    Place __2__ pound weight on left ankle and straighten knee fully, lower slowly. Repeat _10___ times per set. Do ____ sets per session. Do ____ sessions per day. 1 http://orth.exer.us/732   Copyright  VHI. All rights reserved.  Self-Mobilization: Heel Slide (Supine)    Slide left heel toward buttocks until a gentle stretch is felt. Hold _10___ seconds. Relax. Repeat __1__ times per set. Do ___2_ sets per session. Do _2___ sessions per day.  http://orth.exer.us/710   Copyright  VHI. All rights reserved.  Strengthening: Quadriceps Set    Tighten muscles on top of thighs by pushing knees down into surface. Hold ___3_ seconds. Repeat __10__ times per set. Do 1____ sets per session. Do __3__ sessions per day.  http://orth.exer.us/602   Copyright  VHI. All rights reserved.  Strengthening: Hip Abduction (Side-Lying)    Tighten muscles on front of left thigh, then lift leg _15___ inches from surface, keeping knee locked.  Repeat __10__ times per set. Do __1__ sets per session. Do __2__ sessions per day.  http://orth.exer.us/622   Copyright  VHI. All rights reserved.  Strengthening: Hip Extension (Prone)    Tighten muscles on front of left thigh, then lift leg __2__ inches from surface, keeping knee locked. Repeat _10___ times per set. Do _1___ sets per session. Do ___2_ sessions per day.  http://orth.exer.us/620   Copyright  VHI. All rights reserved.  Self-Mobilization: Knee Flexion (Prone)    Bring left heel toward buttocks as close as possible. Hold __5__ seconds. Relax. Repeat 10____ times per set. Do __1__ sets per session. Do __2__ sessions per day.  http://orth.exer.us/596   Copyright  VHI. All rights reserved.

## 2015-09-18 ENCOUNTER — Ambulatory Visit (HOSPITAL_COMMUNITY): Payer: Medicaid Other | Attending: Orthopedic Surgery | Admitting: Physical Therapy

## 2015-09-18 DIAGNOSIS — R269 Unspecified abnormalities of gait and mobility: Secondary | ICD-10-CM | POA: Insufficient documentation

## 2015-09-18 DIAGNOSIS — R29898 Other symptoms and signs involving the musculoskeletal system: Secondary | ICD-10-CM | POA: Diagnosis present

## 2015-09-18 DIAGNOSIS — R262 Difficulty in walking, not elsewhere classified: Secondary | ICD-10-CM | POA: Insufficient documentation

## 2015-09-18 DIAGNOSIS — R2689 Other abnormalities of gait and mobility: Secondary | ICD-10-CM | POA: Insufficient documentation

## 2015-09-18 DIAGNOSIS — M25662 Stiffness of left knee, not elsewhere classified: Secondary | ICD-10-CM | POA: Diagnosis present

## 2015-09-18 NOTE — Patient Instructions (Signed)
Bridging    Slowly raise buttocks from floor, keeping stomach tight. Repeat __10__ times per set. Do __1__ sets per session. Do _2___ sessions per day.  http://orth.exer.us/1096   Copyright  VHI. All rights reserved.  Heel Raise: Bilateral (Standing)   At the kitchen counter:  Rise on balls of feet. Repeat _10___ times per set. Do _1___ sets per session. Do ____2 sessions per day.  http://orth.exer.us/38   Copyright  VHI. All rights reserved.  Functional Quadriceps: Chair Squat    Keeping feet flat on floor, shoulder width apart, squat as low as is comfortable. Use support as necessary. Repeat ___10_ times per set. Do ___1_ sets per session. Do ___2_ sessions per day.  http://orth.exer.us/736   Copyright  VHI. All rights reserved.

## 2015-09-18 NOTE — Therapy (Signed)
[facility] [address] [place], [place], [address] Phone: [phone]   Fax:  [phone]  Physical Therapy Treatment  Patient Details  Name: Colleen Nunez MRN: [id number] Date of Birth: [birth year] Referring Provider: Eden Lathe  Encounter Date: 09/18/2015      PT End of Session - 09/18/15 1103    Visit Number 2   Number of Visits 8   Date for PT Re-Evaluation 10/06/15   Authorization Type medicaid   PT Start Time 1020   PT Stop Time 1105   PT Time Calculation (min) 45 min   Equipment Utilized During Treatment Gait belt   Activity Tolerance Patient tolerated treatment well   Behavior During Therapy [facility] for tasks assessed/performed      Past Medical History  Diagnosis Date  . Hypertension   . Arthritis   . Hepatitis C antibody test positive        . Renal insufficiency   . Anxiety   . Depression     h/o suicide attempts in the past  . GERD (gastroesophageal reflux disease)   . Polysubstance abuse     h/o  . Anemia   . Gout     Past Surgical History  Procedure Laterality Date  . Knee arthroscopy    . Abdominal hysterectomy    . Cartilage removal      left knee  . Anterior cervical corpectomy N/A 06/09/2013    Procedure: C4 C5 Corpectomy with C6-7 Anterior cervical fusion/Peek cage/Trestle plate;  Surgeon: Otilio Connors, MD;  Location: [facility];  Service: Neurosurgery;  Laterality: N/A;  Cervical Four, Cervical Five Corpectomy and Cervical six-seven Anterior cervical fusion/Peek cage Three-Five /Trestle Plating Cervical Three to Cervical Seven  . X-stop implantation    . Bilateral soo and appendectomy  2006  . Anterior cervical decomp/discectomy fusion N/A 08/12/2013    Procedure: Repair of Anterior  Cervical CSF LEAK, lumbar drain placement.;  Surgeon: Floyce Stakes, MD;  Location: [facility];  Service: Neurosurgery;  Laterality: N/A;  . Colonoscopy N/A 07/04/2015    Procedure: COLONOSCOPY;  Surgeon: Rogene Houston, MD;  [facility];  Service: Endoscopy;  Laterality: N/A;  100    There were no vitals filed for this visit.  Visit Diagnosis:  Abnormality of gait  Difficulty walking  Left leg weakness  Unstable balance  Stiffness of left knee      Subjective Assessment - 09/18/15 1036    Subjective PT states tnat she has been doing her exercises and she is sore /   Currently in Pain? No/denies                         [facility] Adult PT Treatment/Exercise - 09/18/15 0001    Ambulation/Gait   Ambulation/Gait Yes   Ambulation/Gait Assistance 6: Modified independent (Device/Increase time)   Ambulation Distance (Feet) 225 Feet   Assistive device Straight cane   Gait Pattern Step-through pattern   Exercises   Exercises Knee/Hip   Knee/Hip Exercises: Stretches   Hip Flexor Stretch Left;3 reps;30 seconds   Hip Flexor Stretch Limitations on 2nd of 4" step    Gastroc Stretch Both;3 reps;30 seconds   Knee/Hip Exercises: Standing   Heel Raises Both;10 reps   Functional Squat 10 reps   Rocker Board 2 minutes   SLS x5 max    Knee/Hip Exercises: Supine   Heel Slides 10 reps   Bridges 10 reps  PT Education - 09/18/15 1103    Education provided Yes   Education Details additional exercises; gt training with cane   Person(s) Educated Patient   Methods Explanation;Handout;Verbal cues;Tactile cues   Comprehension Verbalized understanding          PT Short Term Goals - 09/06/15 1204    PT SHORT TERM GOAL #1   Title 1 HEP   Time 1   Period Weeks   Status New   PT SHORT TERM GOAL #2   Title Pt to be able to walk with a cane in her home   Time 3   Period Weeks   Status New   PT SHORT TERM GOAL #3   Title Pt strength to be improve by 1/2 grade to be able to go up and down steps in a reciprocol manner   Time 3   Period Weeks           PT Long Term Goals - 09/06/15 1205    PT LONG TERM GOAL #1   Title I in advance HPE   Time  5   Period Weeks   PT LONG TERM GOAL #2   Title Pt strength to be increased by 1 grade to be able to come sit to stand without difficulty   Time 5   Period Weeks   PT LONG TERM GOAL #3   Title Pt to feel comfortable walking outside without an assistive device for 20    Time 4   Period Weeks   PT LONG TERM GOAL #4   Title Pt to be able to SLS for 10 seconds to reduce risk of falling    Time 5   Period Weeks               Plan - 09/18/15 1104    Clinical Impression Statement Pt has progressed well with impoved ability in walking.  Gt trained with cane.  Instructed pt in new exercises and given new exercise sheet.ROM to 118    PT Next Visit Plan begin lateral and forward step ups as well as lunges         Problem List Patient Active Problem List   Diagnosis Date Noted  . Hepatitis C 03/02/2015  . Spondylosis, cervical, with myelopathy 09/21/2013  . Quadriplegia ([place]) 09/21/2013  . Osteoarthritis of left knee 09/21/2013  . Hypokalemia 08/16/2013  . AKI (acute kidney injury) ([place]) 08/16/2013  . UTI (lower urinary tract infection) 08/07/2013  . Nausea & vomiting 08/07/2013  . Chronic kidney disease, stage IV (severe) ([place]) 08/07/2013  . Cervical myelopathy ([place]) 08/07/2013  . GERD 06/12/2009  . CHEST PAIN 06/12/2009  . LEG PAIN, BILATERAL 05/28/2009  . OBESITY 12/17/2007  . DYSPHAGIA UNSPECIFIED 12/03/2007  . ANEMIA, NORMOCYTIC 07/13/2007  . ALCOHOL ABUSE 07/13/2007  . RENAL DISEASE, CHRONIC, STAGE II 07/13/2007  . ANXIETY 06/29/2007  . DEPRESSION 06/29/2007  . HYPERTENSION 06/29/2007  . CONSTIPATION 06/29/2007  . ARTHRITIS 06/29/2007  . MALAISE AND FATIGUE 06/29/2007   Rayetta Humphrey, PT CLT [phone] 09/18/2015, 11:08 AM  [facility] [address] [place], [place], [address] Phone: [phone]   Fax:  [phone]  Name: Colleen Nunez MRN: [id number] Date of Birth: Apr 06, 1956

## 2015-09-20 ENCOUNTER — Ambulatory Visit (HOSPITAL_COMMUNITY): Payer: Medicaid Other | Admitting: Physical Therapy

## 2015-09-20 DIAGNOSIS — R269 Unspecified abnormalities of gait and mobility: Secondary | ICD-10-CM | POA: Diagnosis not present

## 2015-09-20 DIAGNOSIS — R29898 Other symptoms and signs involving the musculoskeletal system: Secondary | ICD-10-CM

## 2015-09-20 DIAGNOSIS — R2689 Other abnormalities of gait and mobility: Secondary | ICD-10-CM

## 2015-09-20 DIAGNOSIS — R262 Difficulty in walking, not elsewhere classified: Secondary | ICD-10-CM

## 2015-09-20 DIAGNOSIS — M25662 Stiffness of left knee, not elsewhere classified: Secondary | ICD-10-CM

## 2015-09-20 NOTE — Therapy (Signed)
[facility] [address] [place], [place], [address] Phone: [phone]   Fax:  [phone]  Physical Therapy Treatment  Patient Details  Name: Colleen Nunez MRN: [id number] Date of Birth: 02-08-1956 Referring Provider: Eden Lathe  Encounter Date: 09/20/2015      PT End of Session - 09/20/15 1058    Visit Number 2   Number of Visits 8   Date for PT Re-Evaluation 10/06/15   Authorization Type medicaid   PT Start Time 1016   PT Stop Time 1058   PT Time Calculation (min) 42 min   Equipment Utilized During Treatment Gait belt   Activity Tolerance Patient tolerated treatment well   Behavior During Therapy [facility] for tasks assessed/performed      Past Medical History  Diagnosis Date  . Hypertension   . Arthritis   . Hepatitis C antibody test positive        . Renal insufficiency   . Anxiety   . Depression     h/o suicide attempts in the past  . GERD (gastroesophageal reflux disease)   . Polysubstance abuse     h/o  . Anemia   . Gout     Past Surgical History  Procedure Laterality Date  . Knee arthroscopy    . Abdominal hysterectomy    . Cartilage removal      left knee  . Anterior cervical corpectomy N/A 06/09/2013    Procedure: C4 C5 Corpectomy with C6-7 Anterior cervical fusion/Peek cage/Trestle plate;  Surgeon: Otilio Connors, MD;  Location: [facility];  Service: Neurosurgery;  Laterality: N/A;  Cervical Four, Cervical Five Corpectomy and Cervical six-seven Anterior cervical fusion/Peek cage Three-Five /Trestle Plating Cervical Three to Cervical Seven  . X-stop implantation    . Bilateral soo and appendectomy  2006  . Anterior cervical decomp/discectomy fusion N/A 08/12/2013    Procedure: Repair of Anterior  Cervical CSF LEAK, lumbar drain placement.;  Surgeon: Floyce Stakes, MD;  Location: [facility];  Service: Neurosurgery;  Laterality: N/A;  . Colonoscopy N/A 07/04/2015    Procedure: COLONOSCOPY;  Surgeon: Rogene Houston, MD;  [facility];  Service: Endoscopy;  Laterality: N/A;  100    There were no vitals filed for this visit.  Visit Diagnosis:  Abnormality of gait  Difficulty walking  Left leg weakness  Unstable balance  Stiffness of left knee      Subjective Assessment - 09/20/15 1021    Subjective Pt reports no pain and states she is working it out at home to keep everything moving.     Currently in Pain? No/denies                         [facility] Adult PT Treatment/Exercise - 09/20/15 1022    Knee/Hip Exercises: Standing   Heel Raises Both;10 reps   Heel Raises Limitations toeraise 10 reps   Forward Lunges Both;10 reps   Forward Lunges Limitations 4" box   Lateral Step Up Left;10 reps;Step Height: 4";Hand Hold: 2   Lateral Step Up Limitations 4"   Forward Step Up Left;10 reps;Step Height: 4";Hand Hold: 1   Forward Step Up Limitations 4"   Functional Squat Limitations unable due to grinding and pain Rt knee   Rocker Board 2 minutes   SLS x5 max    Knee/Hip Exercises: Supine   Heel Slides 10 reps   Heel Slides Limitations AROM 120   Bridges 15 reps  Straight Leg Raises Both;10 reps                  PT Short Term Goals - 09/06/15 1204    PT SHORT TERM GOAL #1   Title 1 HEP   Time 1   Period Weeks   Status New   PT SHORT TERM GOAL #2   Title Pt to be able to walk with a cane in her home   Time 3   Period Weeks   Status New   PT SHORT TERM GOAL #3   Title Pt strength to be improve by 1/2 grade to be able to go up and down steps in a reciprocol manner   Time 3   Period Weeks           PT Long Term Goals - 09/06/15 1205    PT LONG TERM GOAL #1   Title I in advance HPE   Time 5   Period Weeks   PT LONG TERM GOAL #2   Title Pt strength to be increased by 1 grade to be able to come sit to stand without difficulty   Time 5   Period Weeks   PT LONG TERM GOAL #3   Title Pt to feel comfortable walking outside without an  assistive device for 20    Time 4   Period Weeks   PT LONG TERM GOAL #4   Title Pt to be able to SLS for 10 seconds to reduce risk of falling    Time 5   Period Weeks               Plan - 09/20/15 1059    Clinical Impression Statement Progressed therex to include forward lunges and step ups. Pt unable to complete step ups with Rt LE or squats due to severe crepitus and pain in Rt knee with weight bearing ROM.  PT able to achieve 120 degrees knee flexion today.  Pt limited by Rt knee at this point and still requires use of RW because of this.     PT Next Visit Plan Continue to progress functional strength.          Problem List Patient Active Problem List   Diagnosis Date Noted  . Hepatitis C 03/02/2015  . Spondylosis, cervical, with myelopathy 09/21/2013  . Quadriplegia ([place]) 09/21/2013  . Osteoarthritis of left knee 09/21/2013  . Hypokalemia 08/16/2013  . AKI (acute kidney injury) ([place]) 08/16/2013  . UTI (lower urinary tract infection) 08/07/2013  . Nausea & vomiting 08/07/2013  . Chronic kidney disease, stage IV (severe) ([place]) 08/07/2013  . Cervical myelopathy ([place]) 08/07/2013  . GERD 06/12/2009  . CHEST PAIN 06/12/2009  . LEG PAIN, BILATERAL 05/28/2009  . OBESITY 12/17/2007  . DYSPHAGIA UNSPECIFIED 12/03/2007  . ANEMIA, NORMOCYTIC 07/13/2007  . ALCOHOL ABUSE 07/13/2007  . RENAL DISEASE, CHRONIC, STAGE II 07/13/2007  . ANXIETY 06/29/2007  . DEPRESSION 06/29/2007  . HYPERTENSION 06/29/2007  . CONSTIPATION 06/29/2007  . ARTHRITIS 06/29/2007  . MALAISE AND FATIGUE 06/29/2007    Teena Irani, PTA/CLT [phone] 09/20/2015, 11:17 AM  [facility] [address] [place], [place], [address] Phone: [phone]   Fax:  [phone]  Name: Colleen Nunez MRN: [id number] Date of Birth: 1956/08/25

## 2015-10-02 ENCOUNTER — Ambulatory Visit (HOSPITAL_COMMUNITY): Payer: Medicaid Other

## 2015-10-02 DIAGNOSIS — M25662 Stiffness of left knee, not elsewhere classified: Secondary | ICD-10-CM

## 2015-10-02 DIAGNOSIS — R29898 Other symptoms and signs involving the musculoskeletal system: Secondary | ICD-10-CM

## 2015-10-02 DIAGNOSIS — R262 Difficulty in walking, not elsewhere classified: Secondary | ICD-10-CM

## 2015-10-02 DIAGNOSIS — R269 Unspecified abnormalities of gait and mobility: Secondary | ICD-10-CM

## 2015-10-02 DIAGNOSIS — R2689 Other abnormalities of gait and mobility: Secondary | ICD-10-CM

## 2015-10-02 NOTE — Therapy (Signed)
[facility] [address] [place], [place], [address] Phone: [phone]   Fax:  [phone]  Physical Therapy Treatment  Patient Details  Name: Colleen Nunez MRN: [id number] Date of Birth: May 28, 1956 Referring Provider: Eden Lathe  Encounter Date: 10/02/2015    Past Medical History  Diagnosis Date  . Hypertension   . Arthritis   . Hepatitis C antibody test positive        . Renal insufficiency   . Anxiety   . Depression     h/o suicide attempts in the past  . GERD (gastroesophageal reflux disease)   . Polysubstance abuse     h/o  . Anemia   . Gout     Past Surgical History  Procedure Laterality Date  . Knee arthroscopy    . Abdominal hysterectomy    . Cartilage removal      left knee  . Anterior cervical corpectomy N/A 06/09/2013    Procedure: C4 C5 Corpectomy with C6-7 Anterior cervical fusion/Peek cage/Trestle plate;  Surgeon: Otilio Connors, MD;  Location: [facility];  Service: Neurosurgery;  Laterality: N/A;  Cervical Four, Cervical Five Corpectomy and Cervical six-seven Anterior cervical fusion/Peek cage Three-Five /Trestle Plating Cervical Three to Cervical Seven  . X-stop implantation    . Bilateral soo and appendectomy  2006  . Anterior cervical decomp/discectomy fusion N/A 08/12/2013    Procedure: Repair of Anterior  Cervical CSF LEAK, lumbar drain placement.;  Surgeon: Floyce Stakes, MD;  Location: [facility];  Service: Neurosurgery;  Laterality: N/A;  . Colonoscopy N/A 07/04/2015    Procedure: COLONOSCOPY;  Surgeon: Rogene Houston, MD;  [facility];  Service: Endoscopy;  Laterality: N/A;  100    There were no vitals filed for this visit.  Visit Diagnosis:  Abnormality of gait  Difficulty walking  Left leg weakness  Unstable balance  Stiffness of left knee      Subjective Assessment - 10/02/15 1015    Subjective Pt reports knee is feeling good, reports her Lt hip is bothering her.  Primary  MD referred her to Dr Luna Glasgow for hip pain.   Currently in Pain? Yes   Pain Score 5    Pain Location Hip   Pain Orientation Left   Pain Descriptors / Indicators Sore             [facility] Adult PT Treatment/Exercise - 10/02/15 0001    Knee/Hip Exercises: Standing   Heel Raises Both;15 reps   Forward Lunges Both;10 reps   Forward Lunges Limitations 4" box   Hip Abduction Both;10 reps   Lateral Step Up Left;10 reps;Step Height: 4";Hand Hold: 2   Lateral Step Up Limitations 4"   Forward Step Up Left;10 reps;Step Height: 4";Hand Hold: 1   Forward Step Up Limitations 4"   Functional Squat Limitations unable due to grinding and pain Rt knee   Wall Squat Limitations unable due to grinding and pain Rt knee   Rocker Board Limitations crepitus noted Rt knee   SLS Rt 5", Lt 3" max of 5   Other Standing Knee Exercises tandem stance 3x 30"            PT Short Term Goals - 09/06/15 1204    PT SHORT TERM GOAL #1   Title 1 HEP   Time 1   Period Weeks   Status New   PT SHORT TERM GOAL #2   Title Pt to be able to walk with a cane in  her home   Time 3   Period Weeks   Status New   PT SHORT TERM GOAL #3   Title Pt strength to be improve by 1/2 grade to be able to go up and down steps in a reciprocol manner   Time 3   Period Weeks           PT Long Term Goals - 09/06/15 1205    PT LONG TERM GOAL #1   Title I in advance HPE   Time 5   Period Weeks   PT LONG TERM GOAL #2   Title Pt strength to be increased by 1 grade to be able to come sit to stand without difficulty   Time 5   Period Weeks   PT LONG TERM GOAL #3   Title Pt to feel comfortable walking outside without an assistive device for 20    Time 4   Period Weeks   PT LONG TERM GOAL #4   Title Pt to be able to SLS for 10 seconds to reduce risk of falling    Time 5   Period Weeks               Plan - 10/02/15 1147    Clinical Impression Statement Session focus on functional strengthening and began balance  activities.  Unable to complete squats, wall squats or rockerboard this session due to severe crepitus and reports of pain Rt knee with weight bearing.. Began abduction for glut med strengthening to improve balance.   Added tandem stance following reports of bed balance with min assistance required for LOB episodes.  No reports of increased pain through session   PT Next Visit Plan Continue to progress functional strength.          Problem List Patient Active Problem List   Diagnosis Date Noted  . Hepatitis C 03/02/2015  . Spondylosis, cervical, with myelopathy 09/21/2013  . Quadriplegia ([place]) 09/21/2013  . Osteoarthritis of left knee 09/21/2013  . Hypokalemia 08/16/2013  . AKI (acute kidney injury) ([place]) 08/16/2013  . UTI (lower urinary tract infection) 08/07/2013  . Nausea & vomiting 08/07/2013  . Chronic kidney disease, stage IV (severe) ([place]) 08/07/2013  . Cervical myelopathy ([place]) 08/07/2013  . GERD 06/12/2009  . CHEST PAIN 06/12/2009  . LEG PAIN, BILATERAL 05/28/2009  . OBESITY 12/17/2007  . DYSPHAGIA UNSPECIFIED 12/03/2007  . ANEMIA, NORMOCYTIC 07/13/2007  . ALCOHOL ABUSE 07/13/2007  . RENAL DISEASE, CHRONIC, STAGE II 07/13/2007  . ANXIETY 06/29/2007  . DEPRESSION 06/29/2007  . HYPERTENSION 06/29/2007  . CONSTIPATION 06/29/2007  . ARTHRITIS 06/29/2007  . MALAISE AND FATIGUE 06/29/2007   Ihor Austin, LPTA; CBIS [phone]  Aldona Lento 10/02/2015, 12:07 PM  [facility] [address] [place], [place], [address] Phone: [phone]   Fax:  [phone]  Name: VONZELLA MACHIN MRN: [id number] Date of Birth: [birth year]

## 2015-10-04 ENCOUNTER — Ambulatory Visit (HOSPITAL_COMMUNITY): Payer: Medicaid Other | Admitting: Physical Therapy

## 2015-10-04 DIAGNOSIS — R262 Difficulty in walking, not elsewhere classified: Secondary | ICD-10-CM

## 2015-10-04 DIAGNOSIS — M25662 Stiffness of left knee, not elsewhere classified: Secondary | ICD-10-CM

## 2015-10-04 DIAGNOSIS — R269 Unspecified abnormalities of gait and mobility: Secondary | ICD-10-CM

## 2015-10-04 DIAGNOSIS — R2689 Other abnormalities of gait and mobility: Secondary | ICD-10-CM

## 2015-10-04 DIAGNOSIS — R29898 Other symptoms and signs involving the musculoskeletal system: Secondary | ICD-10-CM

## 2015-10-04 NOTE — Therapy (Signed)
[facility] [place], [place], [address] Phone: [phone]   Fax:  [phone]  Physical Therapy Treatment  Patient Details  Name: Colleen Nunez MRN: [id number] Date of Birth: 07-Dec-1955 Referring Provider: Eden Lathe  Encounter Date: 10/04/2015      PT End of Session - 10/04/15 1044    Visit Number 3   Number of Visits 8   Date for PT Re-Evaluation 10/06/15   Authorization Type medicaid   PT Start Time 1020   PT Stop Time 1100   PT Time Calculation (min) 40 min   Equipment Utilized During Treatment Gait belt   Activity Tolerance Patient tolerated treatment well   Behavior During Therapy [facility] for tasks assessed/performed      Past Medical History  Diagnosis Date  . Hypertension   . Arthritis   . Hepatitis C antibody test positive        . Renal insufficiency   . Anxiety   . Depression     h/o suicide attempts in the past  . GERD (gastroesophageal reflux disease)   . Polysubstance abuse     h/o  . Anemia   . Gout     Past Surgical History  Procedure Laterality Date  . Knee arthroscopy    . Abdominal hysterectomy    . Cartilage removal      left knee  . Anterior cervical corpectomy N/A 06/09/2013    Procedure: C4 C5 Corpectomy with C6-7 Anterior cervical fusion/Peek cage/Trestle plate;  Surgeon: Otilio Connors, MD;  Location: [facility];  Service: Neurosurgery;  Laterality: N/A;  Cervical Four, Cervical Five Corpectomy and Cervical six-seven Anterior cervical fusion/Peek cage Three-Five /Trestle Plating Cervical Three to Cervical Seven  . X-stop implantation    . Bilateral soo and appendectomy  2006  . Anterior cervical decomp/discectomy fusion N/A 08/12/2013    Procedure: Repair of Anterior  Cervical CSF LEAK, lumbar drain placement.;  Surgeon: Floyce Stakes, MD;  Location: [facility];  Service: Neurosurgery;  Laterality: N/A;  . Colonoscopy N/A 07/04/2015    Procedure: COLONOSCOPY;  Surgeon: Rogene Houston, MD;  [facility];  Service: Endoscopy;  Laterality: N/A;  100    There were no vitals filed for this visit.  Visit Diagnosis:  Difficulty walking  Unstable balance  Stiffness of left knee  Left leg weakness  Abnormality of gait      Subjective Assessment - 10/04/15 1030    Subjective Pt states her LT hip pain has progressively gotten worse.  STates she's going to see Dr. Luna Glasgow today at 1:30.  Currently with 6/10 pain in Lt hip.   Currently in Pain? Yes   Pain Score 6    Pain Location Hip   Pain Orientation Left   Pain Descriptors / Indicators Shooting;Sharp;Sore   Aggravating Factors  weight bearing and moblility                         [facility] Adult PT Treatment/Exercise - 10/04/15 0001    Knee/Hip Exercises: Stretches   Active Hamstring Stretch Left;3 reps;20 seconds   Active Hamstring Stretch Limitations supine with rope   Piriformis Stretch Left;20 seconds;3 reps   Piriformis Stretch Limitations PROM   Knee/Hip Exercises: Supine   Heel Slides 10 reps   Hip Adduction Isometric 10 reps   Bridges 15 reps   Straight Leg Raises Both;10 reps   Knee/Hip Exercises: Sidelying   Hip ABduction Right;5  reps   Clams 10 reps right                  PT Short Term Goals - 09/06/15 1204    PT SHORT TERM GOAL #1   Title 1 HEP   Time 1   Period Weeks   Status New   PT SHORT TERM GOAL #2   Title Pt to be able to walk with a cane in her home   Time 3   Period Weeks   Status New   PT SHORT TERM GOAL #3   Title Pt strength to be improve by 1/2 grade to be able to go up and down steps in a reciprocol manner   Time 3   Period Weeks           PT Long Term Goals - 09/06/15 1205    PT LONG TERM GOAL #1   Title I in advance HPE   Time 5   Period Weeks   PT LONG TERM GOAL #2   Title Pt strength to be increased by 1 grade to be able to come sit to stand without difficulty   Time 5   Period Weeks   PT LONG TERM GOAL #3    Title Pt to feel comfortable walking outside without an assistive device for 20    Time 4   Period Weeks   PT LONG TERM GOAL #4   Title Pt to be able to SLS for 10 seconds to reduce risk of falling    Time 5   Period Weeks               Plan - 10/04/15 1044    Clinical Impression Statement Pt with increased Lt hip pain today with appt scheduled following therapy today with hopes of getting a shot in her hip.  PT with increased pain with weight bearing actvities so these held today.  Focused on supine and sidelying therex and stretching today to help decrease pain.  Added sidelying hip abduction and clams to increase stregnth. Pt reported overall pain reduction at end of session.     PT Next Visit Plan Continue to progress functional strength and resume weight bearing exercises as able.         Problem List Patient Active Problem List   Diagnosis Date Noted  . Hepatitis C 03/02/2015  . Spondylosis, cervical, with myelopathy 09/21/2013  . Quadriplegia ([place]) 09/21/2013  . Osteoarthritis of left knee 09/21/2013  . Hypokalemia 08/16/2013  . AKI (acute kidney injury) ([place]) 08/16/2013  . UTI (lower urinary tract infection) 08/07/2013  . Nausea & vomiting 08/07/2013  . Chronic kidney disease, stage IV (severe) ([place]) 08/07/2013  . Cervical myelopathy ([place]) 08/07/2013  . GERD 06/12/2009  . CHEST PAIN 06/12/2009  . LEG PAIN, BILATERAL 05/28/2009  . OBESITY 12/17/2007  . DYSPHAGIA UNSPECIFIED 12/03/2007  . ANEMIA, NORMOCYTIC 07/13/2007  . ALCOHOL ABUSE 07/13/2007  . RENAL DISEASE, CHRONIC, STAGE II 07/13/2007  . ANXIETY 06/29/2007  . DEPRESSION 06/29/2007  . HYPERTENSION 06/29/2007  . CONSTIPATION 06/29/2007  . ARTHRITIS 06/29/2007  . MALAISE AND FATIGUE 06/29/2007    Teena Irani, PTA/CLT [phone]  10/04/2015, 11:22 AM  [facility] [address] [place], [place], [address] Phone: [phone]   Fax:   [phone]  Name: Colleen Nunez MRN: [id number] Date of Birth: 1955-12-15

## 2015-10-09 ENCOUNTER — Ambulatory Visit (HOSPITAL_COMMUNITY): Payer: Medicaid Other | Admitting: Physical Therapy

## 2015-10-09 DIAGNOSIS — R262 Difficulty in walking, not elsewhere classified: Secondary | ICD-10-CM

## 2015-10-09 DIAGNOSIS — R269 Unspecified abnormalities of gait and mobility: Secondary | ICD-10-CM | POA: Diagnosis not present

## 2015-10-09 DIAGNOSIS — R2689 Other abnormalities of gait and mobility: Secondary | ICD-10-CM

## 2015-10-09 DIAGNOSIS — M25662 Stiffness of left knee, not elsewhere classified: Secondary | ICD-10-CM

## 2015-10-09 DIAGNOSIS — R29898 Other symptoms and signs involving the musculoskeletal system: Secondary | ICD-10-CM

## 2015-10-09 NOTE — Therapy (Signed)
[place], [place], [address] Phone: [phone]   Fax:  [phone]  Physical Therapy Treatment  Patient Details  Name: Colleen Nunez MRN: [id number] Date of Birth: [birth year] Referring Provider: Eden Lathe  Encounter Date: 10/09/2015      PT End of Session - 10/09/15 1207    Visit Number 4   Number of Visits 9   Date for PT Re-Evaluation 11/08/15   Authorization Type medicaid- end date 10/14/2015- Pt should have 5 more visits ( first visit was an evaluation)  will need to contact medicaid for authorization of extended dates    PT Start Time 1020   PT Stop Time 1105   PT Time Calculation (min) 45 min   Equipment Utilized During Treatment Gait belt   Activity Tolerance Patient limited by pain      Past Medical History  Diagnosis Date  . Hypertension   . Arthritis   . Hepatitis C antibody test positive        . Renal insufficiency   . Anxiety   . Depression     h/o suicide attempts in the past  . GERD (gastroesophageal reflux disease)   . Polysubstance abuse     h/o  . Anemia   . Gout     Past Surgical History  Procedure Laterality Date  . Knee arthroscopy    . Abdominal hysterectomy    . Cartilage removal      left knee  . Anterior cervical corpectomy N/A 06/09/2013    Procedure: C4 C5 Corpectomy with C6-7 Anterior cervical fusion/Peek cage/Trestle plate;  Surgeon: Otilio Connors, MD;  Location: [facility];  Service: Neurosurgery;  Laterality: N/A;  Cervical Four, Cervical Five Corpectomy and Cervical six-seven Anterior cervical fusion/Peek cage Three-Five /Trestle Plating Cervical Three to Cervical Seven  . X-stop implantation    . Bilateral soo and appendectomy  2006  . Anterior cervical decomp/discectomy fusion N/A 08/12/2013    Procedure: Repair of Anterior  Cervical CSF LEAK, lumbar drain placement.;  Surgeon: Floyce Stakes, MD;  Location: [facility];  Service: Neurosurgery;  Laterality:  N/A;  . Colonoscopy N/A 07/04/2015    Procedure: COLONOSCOPY;  Surgeon: Rogene Houston, MD;  [facility];  Service: Endoscopy;  Laterality: N/A;  100    There were no vitals filed for this visit.  Visit Diagnosis:  Unstable balance  Stiffness of left knee  Difficulty walking  Left leg weakness      Subjective Assessment - 10/09/15 1018    Subjective Pt states she got a shot in her hip so it is feeling better.    Currently in Pain? Yes   Pain Score 3    Pain Location Leg   Pain Orientation Left   Pain Descriptors / Indicators Aching            [facility] PT Assessment - 10/09/15 0001    Functional Tests   Functional tests Single leg stance;Sit to Stand   Single Leg Stance   Comments Rt 8; Lt 4 was Rt 3 seconds Lt 0 seconds    Sit to Stand   Comments 25 seconds was 42.51    ROM / Strength   AROM / PROM / Strength AROM   AROM   Right/Left Knee Left   Left Knee Extension 0   Left Knee Flexion 125   Strength   Left Hip Flexion 5/5   Left Hip Extension 4-/5  was 3/5  Left Hip ABduction 4/5  was 4-/5    Left Knee Flexion 5/5  was 3/5    Left Knee Extension 5/5  was 3+/5    Left Ankle Dorsiflexion 5/5     foto 55 was 45.                 [facility] Adult PT Treatment/Exercise - 10/09/15 1024    Exercises   Exercises Knee/Hip   Knee/Hip Exercises: Standing   Heel Raises 10 reps   Forward Lunges Left;10 reps   Side Lunges 10 reps   Knee/Hip Exercises: Seated   Sit to Sand 10 reps   Knee/Hip Exercises: Supine   Bridges 15 reps   Knee Extension Limitations 0   Knee Flexion Limitations 125   Other Supine Knee/Hip Exercises decompression 1-5 for hip    Knee/Hip Exercises: Sidelying   Hip ABduction Left;10 reps   Knee/Hip Exercises: Prone   Hip Extension Strengthening;Both;10 reps                PT Education - 10/09/15 1205    Education provided Yes   Education Details given new exercises for balance    Person(s) Educated Patient    Methods Explanation;Demonstration;Handout   Comprehension Verbalized understanding;Returned demonstration          PT Short Term Goals - 10/09/15 1056    PT SHORT TERM GOAL #1   Title 1 HEP   Time 1   Period Weeks   Status Achieved   PT SHORT TERM GOAL #2   Title Pt to be able to walk with a cane in her home   Time 3   Period Weeks   Status Achieved   PT SHORT TERM GOAL #3   Title Pt strength to be improve by 1/2 grade to be able to go up and down steps in a reciprocol manner   Time 3   Period Weeks   Status Achieved           PT Long Term Goals - 10/09/15 1056    PT LONG TERM GOAL #1   Title I in advance HPE   Time 5   Period Weeks   Status Achieved   PT LONG TERM GOAL #2   Title Pt strength to be increased by 1 grade to be able to come sit to stand without difficulty   Time 5   Period Weeks   Status Achieved   PT LONG TERM GOAL #3   Title Pt to feel comfortable walking outside without an assistive device for 20    Time 4   Period Weeks   Status On-going   PT LONG TERM GOAL #4   Title Pt to be able to SLS for 10 seconds to reduce risk of falling    Period Weeks   Status On-going               Plan - 10/09/15 1209    Clinical Impression Statement Pt weightbearing activitiy is limited by both Lt hiip pain and Rt knee pain with significant crepitus in Rt knee.  PT reassessed with good ROM and improving strength and balance of Lt knee.  Pt needs to continue using cane due to Rt LE .   PT Next Visit Plan attempt reauthorization to extend date for medicaid visits.         Problem List Patient Active Problem List   Diagnosis Date Noted  . Hepatitis C 03/02/2015  . Spondylosis, cervical, with myelopathy 09/21/2013  .  Quadriplegia ([place]) 09/21/2013  . Osteoarthritis of left knee 09/21/2013  . Hypokalemia 08/16/2013  . AKI (acute kidney injury) ([place]) 08/16/2013  . UTI (lower urinary tract infection) 08/07/2013  . Nausea & vomiting 08/07/2013  .  Chronic kidney disease, stage IV (severe) ([place]) 08/07/2013  . Cervical myelopathy ([place]) 08/07/2013  . GERD 06/12/2009  . CHEST PAIN 06/12/2009  . LEG PAIN, BILATERAL 05/28/2009  . OBESITY 12/17/2007  . DYSPHAGIA UNSPECIFIED 12/03/2007  . ANEMIA, NORMOCYTIC 07/13/2007  . ALCOHOL ABUSE 07/13/2007  . RENAL DISEASE, CHRONIC, STAGE II 07/13/2007  . ANXIETY 06/29/2007  . DEPRESSION 06/29/2007  . HYPERTENSION 06/29/2007  . CONSTIPATION 06/29/2007  . ARTHRITIS 06/29/2007  . MALAISE AND FATIGUE 06/29/2007  Rayetta Humphrey, PT CLT [phone] 10/09/2015, 12:12 PM  [facility] [address] [place], [place], [address] Phone: [phone]   Fax:  [phone]  Name: JULIENE STELLMAN MRN: [id number] Date of Birth: 04-04-1956

## 2015-10-09 NOTE — Patient Instructions (Signed)
Heel Raise: Bilateral (Standing)    Rise on balls of feet. Repeat 10____ times per set. Do ___1_ sets per session. Do __2__ sessions per day.  http://orth.exer.us/38   Copyright  VHI. All rights reserved.  Balance: Unilateral    Attempt to balance on left leg, eyes open. Hold __5-10__ seconds. Repeat __3__ times per set. Do _1___ sets per session. Do __2__ sessions per day. Perform exercise with eyes closed.  http://orth.exer.us/28   Copyright  VHI. All rights reserved.  Functional Quadriceps: Sit to Stand    Sit on edge of chair, feet flat on floor. Stand upright, extending knees fully. Repeat _10___ times per set. Do _1___ sets per session. Do _2___ sessions per day.  http://orth.exer.us/734   Copyright  VHI. All rights reserved.

## 2015-10-16 ENCOUNTER — Ambulatory Visit (HOSPITAL_COMMUNITY): Payer: Medicaid Other | Admitting: Physical Therapy

## 2015-10-18 ENCOUNTER — Encounter (HOSPITAL_COMMUNITY): Payer: Medicaid Other

## 2015-10-23 ENCOUNTER — Encounter (HOSPITAL_COMMUNITY): Payer: Medicaid Other

## 2015-12-22 ENCOUNTER — Emergency Department (HOSPITAL_COMMUNITY)
Admission: EM | Admit: 2015-12-22 | Discharge: 2015-12-22 | Disposition: A | Discharge status: Home / Self Care | Payer: Medicaid Other | Attending: Emergency Medicine | Admitting: Emergency Medicine

## 2015-12-22 ENCOUNTER — Encounter (HOSPITAL_COMMUNITY): Payer: Self-pay | Admitting: Emergency Medicine

## 2015-12-22 DIAGNOSIS — Z862 Personal history of diseases of the blood and blood-forming organs and certain disorders involving the immune mechanism: Secondary | ICD-10-CM | POA: Diagnosis not present

## 2015-12-22 DIAGNOSIS — M150 Primary generalized (osteo)arthritis: Secondary | ICD-10-CM

## 2015-12-22 DIAGNOSIS — W1839XA Other fall on same level, initial encounter: Secondary | ICD-10-CM | POA: Insufficient documentation

## 2015-12-22 DIAGNOSIS — M159 Polyosteoarthritis, unspecified: Secondary | ICD-10-CM | POA: Insufficient documentation

## 2015-12-22 DIAGNOSIS — S39012A Strain of muscle, fascia and tendon of lower back, initial encounter: Secondary | ICD-10-CM | POA: Diagnosis not present

## 2015-12-22 DIAGNOSIS — S79912A Unspecified injury of left hip, initial encounter: Secondary | ICD-10-CM | POA: Insufficient documentation

## 2015-12-22 DIAGNOSIS — Z8719 Personal history of other diseases of the digestive system: Secondary | ICD-10-CM | POA: Diagnosis not present

## 2015-12-22 DIAGNOSIS — Z9889 Other specified postprocedural states: Secondary | ICD-10-CM | POA: Insufficient documentation

## 2015-12-22 DIAGNOSIS — Y9389 Activity, other specified: Secondary | ICD-10-CM | POA: Diagnosis not present

## 2015-12-22 DIAGNOSIS — Y92009 Unspecified place in unspecified non-institutional (private) residence as the place of occurrence of the external cause: Secondary | ICD-10-CM | POA: Diagnosis not present

## 2015-12-22 DIAGNOSIS — Z87448 Personal history of other diseases of urinary system: Secondary | ICD-10-CM | POA: Diagnosis not present

## 2015-12-22 DIAGNOSIS — Z8619 Personal history of other infectious and parasitic diseases: Secondary | ICD-10-CM | POA: Diagnosis not present

## 2015-12-22 DIAGNOSIS — F419 Anxiety disorder, unspecified: Secondary | ICD-10-CM | POA: Diagnosis not present

## 2015-12-22 DIAGNOSIS — I10 Essential (primary) hypertension: Secondary | ICD-10-CM | POA: Diagnosis not present

## 2015-12-22 DIAGNOSIS — Z79899 Other long term (current) drug therapy: Secondary | ICD-10-CM | POA: Insufficient documentation

## 2015-12-22 DIAGNOSIS — Y998 Other external cause status: Secondary | ICD-10-CM | POA: Insufficient documentation

## 2015-12-22 DIAGNOSIS — M25552 Pain in left hip: Secondary | ICD-10-CM

## 2015-12-22 MED ORDER — METHOCARBAMOL 500 MG PO TABS
500.0000 mg | ORAL_TABLET | Freq: Once | ORAL | Status: AC
  Administered 2015-12-22: 500 mg via ORAL
  Filled 2015-12-22: qty 1

## 2015-12-22 MED ORDER — DEXAMETHASONE SODIUM PHOSPHATE 4 MG/ML IJ SOLN
8.0000 mg | Freq: Once | INTRAMUSCULAR | Status: AC
  Administered 2015-12-22: 8 mg via INTRAMUSCULAR
  Filled 2015-12-22: qty 2

## 2015-12-22 MED ORDER — DEXAMETHASONE 4 MG PO TABS: 4.0000 mg | ORAL_TABLET | Freq: Two times a day (BID) | ORAL | Status: DC

## 2015-12-22 MED ORDER — KETOROLAC TROMETHAMINE 60 MG/2ML IM SOLN
60.0000 mg | Freq: Once | INTRAMUSCULAR | Status: AC
  Administered 2015-12-22: 60 mg via INTRAMUSCULAR
  Filled 2015-12-22: qty 2

## 2015-12-22 MED ORDER — HYDROCODONE-ACETAMINOPHEN 5-325 MG PO TABS
2.0000 | ORAL_TABLET | Freq: Once | ORAL | Status: AC
  Administered 2015-12-22: 2 via ORAL
  Filled 2015-12-22: qty 2

## 2015-12-22 MED ORDER — METHOCARBAMOL 500 MG PO TABS: 500.0000 mg | ORAL_TABLET | Freq: Three times a day (TID) | ORAL | Status: DC

## 2015-12-22 MED ORDER — TRAMADOL HCL 50 MG PO TABS: 50.0000 mg | ORAL_TABLET | Freq: Four times a day (QID) | ORAL | Status: DC | PRN

## 2015-12-22 NOTE — ED Notes (Signed)
Fell at home on Jan 20 th, injury left hip with foot swelling.  Went to [facility] last Monday follow up knee surgery and fall.  Xray done and told to go to PCP, but pt did not go.  Here today because she is still having pain.  Pt says she can not afford to go to PCP, need pain medication.  Left hip pain 5/10 currently.

## 2015-12-22 NOTE — Discharge Instructions (Signed)
Please use Decadron 2 times daily with food, and Robaxin 3 times daily for back spasm. May use Ultram for more severe pain. Ultram and Robaxin may cause drowsiness, please use these medications with caution. Please see the physicians at the [facility] Arthritis Arthritis means joint pain. It can also mean joint disease. A joint is a place where bones come together. People who have arthritis may have:  Red joints.  Swollen joints.  Stiff joints.  Warm joints.  A fever.  A feeling of being sick. HOME CARE Pay attention to any changes in your symptoms. Take these actions to help with your pain and swelling. Medicines  Take over-the-counter and prescription medicines only as told by your doctor.  Do not take aspirin for pain if your doctor says that you may have gout. Activities  Rest your joint if your doctor tells you to.  Avoid activities that make the pain worse.  Exercise your joint regularly as told by your doctor. Try doing exercises like:  Swimming.  Water aerobics.  Biking.  Walking. Joint Care  If your joint is swollen, keep it raised (elevated) if told by your doctor.  If your joint feels stiff in the morning, try taking a warm shower.  If you have diabetes, do not apply heat without asking your doctor.  If told, apply heat to the joint:  Put a towel between the joint and the hot pack or heating pad.  Leave the heat on the area for 20-30 minutes.  If told, apply ice to the joint:  Put ice in a plastic bag.  Place a towel between your skin and the bag.  Leave the ice on for 20 minutes, 2-3 times per day.  Keep all follow-up visits as told by your doctor. GET HELP IF:  The pain gets worse.  You have a fever. GET HELP RIGHT AWAY IF:  You have very bad pain in your joint.  You have swelling in your joint.  Your joint is red.  Many joints become painful and swollen.  You have very bad back pain.  Your leg is very weak.  You cannot  control your pee (urine) or poop (stool).   This information is not intended to replace advice given to you by your health care provider. Make sure you discuss any questions you have with your health care provider.   Document Released: 01/28/2010 Document Revised: 07/25/2015 Document Reviewed: 01/29/2015 Elsevier Interactive Patient Education 2016 [facility], until you're able to see her primary physician.

## 2015-12-22 NOTE — ED Provider Notes (Signed)
CSN: [id number]     Arrival date & time 12/22/15  1353 History   First MD Initiated Contact with Patient 12/22/15 1429     Chief Complaint  Patient presents with  . Fall     (Consider location/radiation/quality/duration/timing/severity/associated sxs/prior Treatment) HPI Comments: Patient is a 60 year old female who presents to the emergency department with a complaint of left hip pain. Patient has a history of arthritis of multiple sites, hypertension, hepatitis C, anxiety, and gout. The patient states that she has had knee surgery approximately a year ago, she sustained a fall on January 20, and injured the left hip. She states that since that time she has pain especially when the weather gets cold at multiple joints. She was scheduled to see Dr. Luna Glasgow for assistance with her joint pain, and she was also scheduled to see her primary care physician, but states she does not have enough money left from her check to pay the co-pay to see either of these physicians. She is currently out of pain medication, and presents now for assistance with her pain and to have her hip checked.    Patient is a 60 y.o. female presenting with fall. The history is provided by the patient.  Fall Associated symptoms include arthralgias.    Past Medical History  Diagnosis Date  . Hypertension   . Arthritis   . Hepatitis C antibody test positive        . Renal insufficiency   . Anxiety   . Depression     h/o suicide attempts in the past  . GERD (gastroesophageal reflux disease)   . Polysubstance abuse     h/o  . Anemia   . Gout    Past Surgical History  Procedure Laterality Date  . Knee arthroscopy    . Abdominal hysterectomy    . Cartilage removal      left knee  . Anterior cervical corpectomy N/A 06/09/2013    Procedure: C4 C5 Corpectomy with C6-7 Anterior cervical fusion/Peek cage/Trestle plate;  Surgeon: Otilio Connors, MD;  Location: [place] [facility];  Service: Neurosurgery;  Laterality: N/A;   Cervical Four, Cervical Five Corpectomy and Cervical six-seven Anterior cervical fusion/Peek cage Three-Five /Trestle Plating Cervical Three to Cervical Seven  . X-stop implantation    . Bilateral soo and appendectomy  2006  . Anterior cervical decomp/discectomy fusion N/A 08/12/2013    Procedure: Repair of Anterior  Cervical CSF LEAK, lumbar drain placement.;  Surgeon: Floyce Stakes, MD;  Location: [facility];  Service: Neurosurgery;  Laterality: N/A;  . Colonoscopy N/A 07/04/2015    Procedure: COLONOSCOPY;  Surgeon: Rogene Houston, MD;  [facility];  Service: Endoscopy;  Laterality: N/A;  100   Family History  Problem Relation Age of Onset  . Diabetes Sister   . Diabetes Mother     deceased age 70  . Kidney failure Mother   . Alcohol abuse Father   . Colon cancer Neg Hx    Social History  Substance Use Topics  . Smoking status: Never Smoker   . Smokeless tobacco: None  . Alcohol Use: 1.8 oz/week    3 Cans of beer per week   OB History    No data available     Review of Systems  Musculoskeletal: Positive for back pain, arthralgias and gait problem.  All other systems reviewed and are negative.     Allergies  Aspirin and Oxycodone  Home Medications   Prior to Admission medications   Medication  Sig Start Date End Date Taking? Authorizing Provider  acetaminophen (TYLENOL) 500 MG tablet Take 500 mg by mouth every 8 (eight) hours as needed.    Historical Provider, MD  amLODipine (NORVASC) 10 MG tablet Take 0.5 tablets (5 mg total) by mouth daily. 09/02/13   Bary Leriche, PA-C  colchicine 0.6 MG tablet Take 1 tablet (0.6 mg total) by mouth 2 (two) times daily. Patient not taking: Reported on 06/30/2015 09/06/13   Meredith Staggers, MD  diclofenac sodium (VOLTAREN) 1 % GEL Apply 2 g topically 4 (four) times daily. To Left knee for arthritis. Patient not taking: Reported on 06/30/2015 09/02/13   Ivan Anchors Love, PA-C  traMADol (ULTRAM) 50 MG tablet Take 1 tablet (50 mg  total) by mouth every 12 (twelve) hours as needed for severe pain. 06/30/15   Merrily Pew, MD   BP 164/72 mmHg  Pulse 67  Temp(Src) 97.9 F (36.6 C) (Oral)  Resp 16  Ht 5\' 2"  (1.575 m)  Wt 72.576 kg  BMI 29.26 kg/m2  SpO2 100% Physical Exam  Constitutional: She is oriented to person, place, and time. She appears well-developed and well-nourished.  Non-toxic appearance.  Strong smell of kerosene on the patient.  HENT:  Head: Normocephalic.  Right Ear: Tympanic membrane and external ear normal.  Left Ear: Tympanic membrane and external ear normal.  Eyes: EOM and lids are normal. Pupils are equal, round, and reactive to light.  Neck: Normal range of motion. Neck supple. Carotid bruit is not present.  Cardiovascular: Normal rate, regular rhythm, normal heart sounds, intact distal pulses and normal pulses.   Pulmonary/Chest: Breath sounds normal. No respiratory distress.  Abdominal: Soft. Bowel sounds are normal. There is no tenderness. There is no guarding.  Musculoskeletal: Normal range of motion.  There is pain with attempted range of motion of the left hip. There is no evidence for dislocation. There is pain with range of motion of both knees. There are degenerative changes noted of both right and left upper extremities. No hot joints appreciated.  Lymphadenopathy:       Head (right side): No submandibular adenopathy present.       Head (left side): No submandibular adenopathy present.    She has no cervical adenopathy.  Neurological: She is alert and oriented to person, place, and time. She has normal strength. No cranial nerve deficit or sensory deficit.  Patient walks with a walker. This is not new for the patient.  Skin: Skin is warm and dry.  Psychiatric: She has a normal mood and affect. Her speech is normal.  Nursing note and vitals reviewed.   ED Course  Procedures (including critical care time) Labs Review Labs Reviewed - No data to display  Imaging Review No results  found. I have personally reviewed and evaluated these images and lab results as part of my medical decision-making.   EKG Interpretation None      MDM  Vital signs are within normal limits. The examination favors exacerbation of degenerative joint disease involving the left hip, and multiple other joints. The patient has a strong smell of kerosene present. I discussed with the patient the importance of being careful because of carbon monoxide issues and poisoning. The patient is referred to the [facility]. she will use this resource until she is able to see her primary physician. Prescription for Decadron, Robaxin, and Ultram given to the patient.    Final diagnoses:  None    **I have reviewed nursing notes,  vital signs, and all appropriate lab and imaging results for this patient.Lily Kocher, PA-C 12/22/15 1516  Julianne Rice, MD 12/24/15 2043

## 2015-12-22 NOTE — ED Notes (Signed)
Pt awaiting "shot time".

## 2016-01-16 ENCOUNTER — Emergency Department (HOSPITAL_COMMUNITY)
Admission: EM | Admit: 2016-01-16 | Discharge: 2016-01-16 | Disposition: A | Discharge status: Home / Self Care | Payer: Medicaid Other | Attending: Emergency Medicine | Admitting: Emergency Medicine

## 2016-01-16 ENCOUNTER — Emergency Department (HOSPITAL_COMMUNITY): Payer: Medicaid Other

## 2016-01-16 ENCOUNTER — Telehealth (INDEPENDENT_AMBULATORY_CARE_PROVIDER_SITE_OTHER): Payer: Self-pay | Admitting: Internal Medicine

## 2016-01-16 ENCOUNTER — Encounter: Payer: Self-pay | Admitting: Orthopaedic Surgery

## 2016-01-16 ENCOUNTER — Ambulatory Visit (INDEPENDENT_AMBULATORY_CARE_PROVIDER_SITE_OTHER): Payer: Medicaid Other | Admitting: Orthopaedic Surgery

## 2016-01-16 ENCOUNTER — Encounter (HOSPITAL_COMMUNITY): Payer: Self-pay

## 2016-01-16 VITALS — BP 145/99 | HR 86 | Temp 97.5°F | Resp 16 | Ht 62.0 in | Wt 160.0 lb

## 2016-01-16 DIAGNOSIS — Z79899 Other long term (current) drug therapy: Secondary | ICD-10-CM | POA: Diagnosis not present

## 2016-01-16 DIAGNOSIS — M25561 Pain in right knee: Secondary | ICD-10-CM

## 2016-01-16 DIAGNOSIS — Z91419 Personal history of unspecified adult abuse: Secondary | ICD-10-CM | POA: Insufficient documentation

## 2016-01-16 DIAGNOSIS — Z87448 Personal history of other diseases of urinary system: Secondary | ICD-10-CM | POA: Insufficient documentation

## 2016-01-16 DIAGNOSIS — I10 Essential (primary) hypertension: Secondary | ICD-10-CM | POA: Insufficient documentation

## 2016-01-16 DIAGNOSIS — M1711 Unilateral primary osteoarthritis, right knee: Secondary | ICD-10-CM | POA: Diagnosis not present

## 2016-01-16 DIAGNOSIS — F1010 Alcohol abuse, uncomplicated: Secondary | ICD-10-CM | POA: Diagnosis not present

## 2016-01-16 DIAGNOSIS — Z8719 Personal history of other diseases of the digestive system: Secondary | ICD-10-CM | POA: Diagnosis not present

## 2016-01-16 DIAGNOSIS — B1710 Acute hepatitis C without hepatic coma: Secondary | ICD-10-CM

## 2016-01-16 DIAGNOSIS — Z791 Long term (current) use of non-steroidal anti-inflammatories (NSAID): Secondary | ICD-10-CM | POA: Insufficient documentation

## 2016-01-16 DIAGNOSIS — Z862 Personal history of diseases of the blood and blood-forming organs and certain disorders involving the immune mechanism: Secondary | ICD-10-CM | POA: Insufficient documentation

## 2016-01-16 MED ORDER — HYDROCODONE-ACETAMINOPHEN 5-325 MG PO TABS: 1.0000 | ORAL_TABLET | ORAL | Status: DC | PRN

## 2016-01-16 MED ORDER — IBUPROFEN 800 MG PO TABS
800.0000 mg | ORAL_TABLET | Freq: Once | ORAL | Status: AC
  Administered 2016-01-16: 800 mg via ORAL
  Filled 2016-01-16: qty 1

## 2016-01-16 NOTE — ED Notes (Signed)
Complain of pain in right knee. Denies injury. States she has arthritis and has been trying to get in touch with Dr. Luna Glasgow

## 2016-01-16 NOTE — Telephone Encounter (Signed)
Labs for her Hepatitis C. When back will submit to [name] for her insurance.

## 2016-01-16 NOTE — Progress Notes (Addendum)
Patient [id number] [name], female DOB:Jun 10, 1956, 60 y.o. [id number]  No chief complaint on file.   HPI  NAZYAH TODHUNTER is a 60 y.o. female who fell last night and again this morning and hurt her right knee.  She was seen in the ER just a while ago. She had x-rays done there.  She was evaluated there and told to come here to see me today.  She came across the street and was seen.  She has no other injury other than the right knee. She has severe DJD of the right knee and it is getting worse and worse.  She uses a walker.  She had seen Dr. Aline Brochure who sent her to [facility] last year where Dr. Eden Lathe did a total knee on the left.  She needs to see him again.  I will make the referral.  HPI  Body mass index is 29.26 kg/(m^2).  Review of Systems  Constitutional:       Patient does not have Diabetes Mellitus. Patient has hypertension. Patient has COPD or shortness of breath. Patient does not have BMI > 35. Patient does not have current smoking history.  HENT: Negative for congestion.   Respiratory: Negative for cough and shortness of breath.   Cardiovascular: Negative for chest pain.  Gastrointestinal:       GERD   Endocrine: Positive for cold intolerance.  Musculoskeletal: Positive for myalgias, joint swelling, arthralgias and gait problem.  Allergic/Immunologic: Negative for environmental allergies.  All other systems reviewed and are negative.   Past Medical History  Diagnosis Date  . Hypertension   . Arthritis   . Hepatitis C antibody test positive        . Renal insufficiency   . Anxiety   . Depression     h/o suicide attempts in the past  . GERD (gastroesophageal reflux disease)   . Polysubstance abuse     h/o  . Anemia   . Gout     Past Surgical History  Procedure Laterality Date  . Knee arthroscopy    . Abdominal hysterectomy    . Cartilage removal      left knee  . Anterior cervical corpectomy N/A 06/09/2013    Procedure: C4 C5 Corpectomy  with C6-7 Anterior cervical fusion/Peek cage/Trestle plate;  Surgeon: Otilio Connors, MD;  Location: [place] [facility];  Service: Neurosurgery;  Laterality: N/A;  Cervical Four, Cervical Five Corpectomy and Cervical six-seven Anterior cervical fusion/Peek cage Three-Five /Trestle Plating Cervical Three to Cervical Seven  . X-stop implantation    . Bilateral soo and appendectomy  2006  . Anterior cervical decomp/discectomy fusion N/A 08/12/2013    Procedure: Repair of Anterior  Cervical CSF LEAK, lumbar drain placement.;  Surgeon: Floyce Stakes, MD;  Location: [place];  Service: Neurosurgery;  Laterality: N/A;  . Colonoscopy N/A 07/04/2015    Procedure: COLONOSCOPY;  Surgeon: Rogene Houston, MD;  [facility];  Service: Endoscopy;  Laterality: N/A;  100    Family History  Problem Relation Age of Onset  . Diabetes Sister   . Diabetes Mother     deceased age 67  . Kidney failure Mother   . Alcohol abuse Father   . Colon cancer Neg Hx     Social History Social History  Substance Use Topics  . Smoking status: Never Smoker   . Smokeless tobacco: None  . Alcohol Use: 1.8 oz/week    3 Cans of beer per week    Allergies  Allergen Reactions  . Aspirin     REACTION: Hx of overdose on this years ago - too scared to take. Tried to committ suicide.  . Oxycodone Nausea And Vomiting    Current Outpatient Prescriptions  Medication Sig Dispense Refill  . acetaminophen (TYLENOL) 500 MG tablet Take 500 mg by mouth every 8 (eight) hours as needed.    Marland Kitchen amLODipine (NORVASC) 10 MG tablet Take 0.5 tablets (5 mg total) by mouth daily.    . colchicine 0.6 MG tablet Take 1 tablet (0.6 mg total) by mouth 2 (two) times daily. 60 tablet 0  . dexamethasone (DECADRON) 4 MG tablet Take 1 tablet (4 mg total) by mouth 2 (two) times daily with a meal. 12 tablet 0  . diclofenac sodium (VOLTAREN) 1 % GEL Apply 2 g topically 4 (four) times daily. To Left knee for arthritis. 4 Tube 1  . ferrous sulfate 325  (65 FE) MG tablet Take 325 mg by mouth daily with breakfast.    . methocarbamol (ROBAXIN) 500 MG tablet Take 1 tablet (500 mg total) by mouth 3 (three) times daily. 21 tablet 0  . traMADol (ULTRAM) 50 MG tablet Take 1 tablet (50 mg total) by mouth every 6 (six) hours as needed. 15 tablet 0  . HYDROcodone-acetaminophen (NORCO/VICODIN) 5-325 MG tablet Take 1 tablet by mouth every 4 (four) hours as needed for moderate pain (Must last 30 days.  Do not take and drive a car or use machinery.). 120 tablet 0   No current facility-administered medications for this visit.     Physical Exam  Blood pressure 145/99, pulse 86, temperature 97.5 F (36.4 C), resp. rate 16, height 5\' 2"  (1.575 m), weight 160 lb (72.576 kg).  Constitutional: overall normal hygiene, normal nutrition, well developed, normal grooming, normal body habitus. Assistive device:walker  Musculoskeletal: gait and station Limp right, muscle tone and strength are normal, no tremors or atrophy is present.  .  Neurological: coordination overall normal.  Deep tendon reflex/nerve stretch intact.  Sensation normal.  Cranial nerves II-XII intact.   Skin:   normal overall  She has left knee scars, lesions, ulcers or rashes. No psoriasis.  Psychiatric: Alert and oriented x 3.  Recent memory intact, remote memory unclear.  Normal mood and affect. Well groomed.  Good eye contact.  Cardiovascular: overall no swelling, no varicosities, no edema bilaterally, normal temperatures of the legs and arms, no clubbing, cyanosis and good capillary refill.  Lymphatic: palpation is normal.  The right lower extremity is examined:  Inspection:  Thigh:  Non-tender and no defects  Knee has swelling 2+ effusion.                        Joint tenderness is present                        Patient is tender over the medial joint line  Lower Leg:  Has normal appearance and no tenderness or defects  Ankle:  Non-tender and no defects  Foot:  Non-tender and no  defects Range of Motion:  Knee:  Range of motion is: 0 to 95                        Crepitus is  present  Ankle:  Range of motion is normal. Strength and Tone:  The right lower extremity has normal strength and tone. Stability:  Knee:  The knee is stable.  Ankle:  The ankle is stable.   Additional services performed: I reviewed the x-rays and x-ray report done earlier today of the right knee at the ER.  I reviewed the ER notes.  I reviewed Dr. Ruthe Mannan notes from last year and we were able to find op note from [facility] to review as well for the left knee.  The patient has been educated about the nature of the problem(s) and counseled on treatment options.  The patient appeared to understand what I have discussed and is in agreement with it.  Encounter Diagnosis  Name Primary?  . Right knee pain Yes    PLAN Call if any problems.  Precautions discussed.  Continue current medications.   Return to clinic to see Dr. Eden Lathe at [facility].

## 2016-01-16 NOTE — ED Provider Notes (Signed)
CSN: [id number]     Arrival date & time 01/16/16  0813 History  By signing my name below, I, Colleen Nunez, attest that this documentation has been prepared under the direction and in the presence of Pattricia Boss, MD. Electronically Signed: Eustaquio Nunez, ED Scribe. 01/16/2016. 8:31 AM.   Chief Complaint  Patient presents with  . Knee Pain   The history is provided by the patient. No language interpreter was used.     HPI Comments: Colleen Nunez is a 60 y.o. female with PMHx HTN, Arthritis, and Gout who presents to the Emergency Department complaining of sudden onset, constant, burning, right knee pain x 5 days. No known injury to the knee but pt does report that she was washing dishes 5 days ago when she heard a popping noise in her knee which caused the pain. The pain is exacerbated with bending and walking. Pt has been taking Advil with some relief. She reports similar pain in her left knee in the past which required knee replacement. Denies joint swelling, redness, weakness, numbness, tingling, or any other associated symptoms.    Past Medical History  Diagnosis Date  . Hypertension   . Arthritis   . Hepatitis C antibody test positive        . Renal insufficiency   . Anxiety   . Depression     h/o suicide attempts in the past  . GERD (gastroesophageal reflux disease)   . Polysubstance abuse     h/o  . Anemia   . Gout    Past Surgical History  Procedure Laterality Date  . Knee arthroscopy    . Abdominal hysterectomy    . Cartilage removal      left knee  . Anterior cervical corpectomy N/A 06/09/2013    Procedure: C4 C5 Corpectomy with C6-7 Anterior cervical fusion/Peek cage/Trestle plate;  Surgeon: Colleen Connors, MD;  Location: [facility];  Service: Neurosurgery;  Laterality: N/A;  Cervical Four, Cervical Five Corpectomy and Cervical six-seven Anterior cervical fusion/Peek cage Three-Five /Trestle Plating Cervical Three to Cervical Seven  . X-stop implantation    .  Bilateral soo and appendectomy  2006  . Anterior cervical decomp/discectomy fusion N/A 08/12/2013    Procedure: Repair of Anterior  Cervical CSF LEAK, lumbar drain placement.;  Surgeon: Colleen Stakes, MD;  Location: [place];  Service: Neurosurgery;  Laterality: N/A;  . Colonoscopy N/A 07/04/2015    Procedure: COLONOSCOPY;  Surgeon: Colleen Houston, MD;  Location: [facility];  Service: Endoscopy;  Laterality: N/A;  100   Family History  Problem Relation Age of Onset  . Diabetes Sister   . Diabetes Mother     deceased age 87  . Kidney failure Mother   . Alcohol abuse Father   . Colon cancer Neg Hx    Social History  Substance Use Topics  . Smoking status: Never Smoker   . Smokeless tobacco: None  . Alcohol Use: 1.8 oz/week    3 Cans of beer per week   OB History    No data available     Review of Systems  Musculoskeletal: Positive for arthralgias (right knee). Negative for joint swelling.  Skin: Negative for color change.  Neurological: Negative for weakness and numbness.  All other systems reviewed and are negative.  Allergies  Aspirin and Oxycodone  Home Medications   Prior to Admission medications   Medication Sig Start Date End Date Taking? Authorizing Provider  acetaminophen (TYLENOL) 500 MG tablet Take 500  mg by mouth every 8 (eight) hours as needed.    Historical Provider, MD  amLODipine (NORVASC) 10 MG tablet Take 0.5 tablets (5 mg total) by mouth daily. 09/02/13   Bary Leriche, PA-C  colchicine 0.6 MG tablet Take 1 tablet (0.6 mg total) by mouth 2 (two) times daily. Patient not taking: Reported on 06/30/2015 09/06/13   Colleen Staggers, MD  dexamethasone (DECADRON) 4 MG tablet Take 1 tablet (4 mg total) by mouth 2 (two) times daily with a meal. 12/22/15   Colleen Kocher, PA-C  diclofenac sodium (VOLTAREN) 1 % GEL Apply 2 g topically 4 (four) times daily. To Left knee for arthritis. Patient not taking: Reported on 06/30/2015 09/02/13   Colleen Anchors Love, PA-C   methocarbamol (ROBAXIN) 500 MG tablet Take 1 tablet (500 mg total) by mouth 3 (three) times daily. 12/22/15   Colleen Kocher, PA-C  traMADol (ULTRAM) 50 MG tablet Take 1 tablet (50 mg total) by mouth every 6 (six) hours as needed. 12/22/15   Colleen Kocher, PA-C   BP 158/95 mmHg  Pulse 82  Temp(Src) 98.6 F (37 C) (Oral)  Resp 18  Ht 5\' 2"  (1.575 m)  Wt 160 lb (72.576 kg)  BMI 29.26 kg/m2  SpO2 99%   Physical Exam  Constitutional: She is oriented to person, place, and time. She appears well-developed and well-nourished. No distress.  HENT:  Head: Normocephalic and atraumatic.  Right Ear: External ear normal.  Left Ear: External ear normal.  Nose: Nose normal.  Eyes: Conjunctivae and EOM are normal. Pupils are equal, round, and reactive to light.  Neck: Normal range of motion. Neck supple.  Pulmonary/Chest: Effort normal.  Musculoskeletal: Normal range of motion. She exhibits tenderness. She exhibits no edema.       Legs: Neurological: She is alert and oriented to person, place, and time. She exhibits normal muscle tone. Coordination normal.  Skin: Skin is warm and dry.  Psychiatric: She has a normal mood and affect. Her behavior is normal. Thought content normal.  Nursing note and vitals reviewed.    ED Course  Procedures (including critical care time)  DIAGNOSTIC STUDIES: Oxygen Saturation is 99% on RA, normal by my interpretation.    COORDINATION OF CARE: 8:28 AM-Discussed treatment plan which includes DG R Knee with pt at bedside and pt agreed to plan.   9:43 AM - Discussed discharge instructions with pt.  Advised to apply ace wrap and cold therapy to knee. Pt should follow up orthopedist, Dr. Luna Glasgow, and continue taking Ibuprofen for the pain.  Pt understands and agrees with plan.   Labs Review Labs Reviewed - No data to display  Imaging Review Dg Knee Complete 4 Views Right  01/16/2016  CLINICAL DATA:  Acute onset pain and popping sensation four days prior EXAM: RIGHT  KNEE - COMPLETE 4+ VIEW COMPARISON:  None. FINDINGS: Frontal, lateral, and bilateral oblique views were obtained. There is no acute fracture or dislocation. There is a small joint effusion. There is marked narrowing laterally with spurring laterally. There is mild osteochondritis dissecans along the lateral distal femoral condyle. There is moderate narrowing of the patellofemoral joint. There is slight narrowing medially. There is extensive chondrocalcinosis. IMPRESSION: Extensive osteoarthritic change, most marked laterally. There is a degree of osteochondritis dissecans along the lateral distal femoral condyle. Small joint effusion. No acute fracture or dislocation. Extensive chondrocalcinosis. Chondrocalcinosis may be seen with osteoarthritis but also may be seen with calcium pyrophosphate deposition disease. Electronically Signed   By: Gwyndolyn Saxon  Jasmine December III M.D.   On: 01/16/2016 08:55   I have personally reviewed and evaluated these images as part of my medical decision-making.   MDM   Final diagnoses:  Primary osteoarthritis of right knee    I personally performed the services described in this documentation, which was scribed in my presence. The recorded information has been reviewed and considered.   Pattricia Boss, MD 01/17/16 [phone]

## 2016-01-16 NOTE — Discharge Instructions (Signed)

## 2016-01-22 LAB — CBC WITH DIFFERENTIAL/PLATELET
BASOS ABS: 0 10*3/uL (ref 0.0–0.1)
BASOS PCT: 0 % (ref 0–1)
EOS ABS: 0.1 10*3/uL (ref 0.0–0.7)
EOS PCT: 3 % (ref 0–5)
HCT: 35.2 % — ABNORMAL LOW (ref 36.0–46.0)
Hemoglobin: 11.5 g/dL — ABNORMAL LOW (ref 12.0–15.0)
Lymphocytes Relative: 42 % (ref 12–46)
Lymphs Abs: 2.1 10*3/uL (ref 0.7–4.0)
MCH: 29.1 pg (ref 26.0–34.0)
MCHC: 32.7 g/dL (ref 30.0–36.0)
MCV: 89.1 fL (ref 78.0–100.0)
MPV: 10.4 fL (ref 8.6–12.4)
Monocytes Absolute: 0.4 10*3/uL (ref 0.1–1.0)
Monocytes Relative: 8 % (ref 3–12)
NEUTROS PCT: 47 % (ref 43–77)
Neutro Abs: 2.3 10*3/uL (ref 1.7–7.7)
PLATELETS: 230 10*3/uL (ref 150–400)
RBC: 3.95 MIL/uL (ref 3.87–5.11)
RDW: 14.3 % (ref 11.5–15.5)
WBC: 4.9 10*3/uL (ref 4.0–10.5)

## 2016-01-23 LAB — HEPATIC FUNCTION PANEL
ALBUMIN: 3.8 g/dL (ref 3.6–5.1)
ALK PHOS: 78 U/L (ref 33–130)
ALT: 13 U/L (ref 6–29)
AST: 21 U/L (ref 10–35)
BILIRUBIN INDIRECT: 0.5 mg/dL (ref 0.2–1.2)
BILIRUBIN TOTAL: 0.6 mg/dL (ref 0.2–1.2)
Bilirubin, Direct: 0.1 mg/dL (ref <=0.2)
TOTAL PROTEIN: 7.3 g/dL (ref 6.1–8.1)

## 2016-01-23 LAB — DRUG SCREEN, URINE
AMPHETAMINE SCRN UR: NEGATIVE
BARBITURATE QUANT UR: NEGATIVE
BENZODIAZEPINES.: NEGATIVE
COCAINE METABOLITES: NEGATIVE
CREATININE, U: 56.78 mg/dL
METHADONE: NEGATIVE
Marijuana Metabolite: NEGATIVE
Opiates: NEGATIVE
Phencyclidine (PCP): NEGATIVE
Propoxyphene: NEGATIVE

## 2016-01-23 LAB — HEPATITIS C RNA QUANTITATIVE
HCV QUANT LOG: 6.48 {Log} — AB (ref <1.18)
HCV Quantitative: 3043971 IU/mL — ABNORMAL HIGH (ref <15)

## 2016-01-24 ENCOUNTER — Telehealth (INDEPENDENT_AMBULATORY_CARE_PROVIDER_SITE_OTHER): Payer: Self-pay | Admitting: Internal Medicine

## 2016-01-24 NOTE — Telephone Encounter (Signed)
Per Con Memos - patient states that she is in [facility], she is planning to have another knee replacement. We will wait to hear from the patient ,after surgery , rehab,ect. To start the PA process and treatment.

## 2016-01-24 NOTE — Telephone Encounter (Signed)
Colleen Nunez, this is a Hep C patient.   Will treat with either Epclusa x 12 weeks or Harvoni x 12 weeks.

## 2016-02-11 ENCOUNTER — Telehealth: Payer: Self-pay | Admitting: Orthopaedic Surgery

## 2016-02-11 NOTE — Telephone Encounter (Signed)
Patient states she went to [facility] and that doctor has given her an injection and wants to hold off on knee replacement.  She is to return to him in 3 months and has an appointment back at [facility] in June.  She said she needs to get pain medication but that doctor told her she had to get it from previous doctor.    She requests Norco 7.5-325 mgs.  Qty  120  Sig: Take 1 tablet by mouth every 4 (four) hours as needed for moderate pain (Must last 30 days. Do not take and drive a car or use machinery.).

## 2016-02-12 MED ORDER — HYDROCODONE-ACETAMINOPHEN 5-325 MG PO TABS: 1.0000 | ORAL_TABLET | ORAL | Status: DC | PRN

## 2016-02-12 NOTE — Telephone Encounter (Signed)
Rx done. 

## 2016-03-13 ENCOUNTER — Telehealth: Payer: Self-pay | Admitting: Orthopaedic Surgery

## 2016-03-13 MED ORDER — HYDROCODONE-ACETAMINOPHEN 5-325 MG PO TABS: 1.0000 | ORAL_TABLET | ORAL | Status: DC | PRN

## 2016-03-13 NOTE — Telephone Encounter (Signed)
Hydrocodone-Acetaminophen  5/325mg  Qty 120 Tablets °

## 2016-03-13 NOTE — Telephone Encounter (Signed)
Rx done. 

## 2016-03-19 ENCOUNTER — Ambulatory Visit (INDEPENDENT_AMBULATORY_CARE_PROVIDER_SITE_OTHER): Payer: Medicaid Other | Admitting: Orthopaedic Surgery

## 2016-03-19 ENCOUNTER — Encounter: Payer: Self-pay | Admitting: Orthopaedic Surgery

## 2016-03-19 VITALS — BP 92/58 | HR 78 | Temp 97.7°F | Resp 16 | Ht 62.0 in | Wt 161.0 lb

## 2016-03-19 DIAGNOSIS — M25562 Pain in left knee: Secondary | ICD-10-CM

## 2016-03-19 DIAGNOSIS — N184 Chronic kidney disease, stage 4 (severe): Secondary | ICD-10-CM | POA: Diagnosis not present

## 2016-03-19 DIAGNOSIS — I10 Essential (primary) hypertension: Secondary | ICD-10-CM | POA: Diagnosis not present

## 2016-03-19 DIAGNOSIS — M7062 Trochanteric bursitis, left hip: Secondary | ICD-10-CM | POA: Diagnosis not present

## 2016-03-19 NOTE — Progress Notes (Signed)
Patient Colleen Nunez, female DOB:[birth year], 60 y.o. [id number]  CC:  My left hip is hurting today.  HPI  Colleen Nunez is a 60 y.o. female who has significant arthritis of her left knee is having pain of the left lateral hip.  The hip has been hurting over the last few weeks.  She has localized lateral pain of the hip. She has no redness.  She has no trauma.    She has been seen at [facility] for knee problems.  She needs a total knee on the left and she has been putting it off.  She has significant deformity and pain of the knee.  She has renal disease and is also being followed at [facility] for this.  She has to watch what she eats and medicine she takes.  She says it is chronically stable.  She has hypertension as well, some related to the renal disease and some essential.  She says this is also well controlled.  HPI  Body mass index is 29.44 kg/(m^2).   Review of Systems  Constitutional:       Patient does not have Diabetes Mellitus. Patient has hypertension. Patient has COPD or shortness of breath. Patient does not have BMI > 35. Patient does not have current smoking history.  HENT: Negative for congestion.   Respiratory: Negative for cough and shortness of breath.   Cardiovascular: Negative for chest pain.  Gastrointestinal:       GERD   Endocrine: Positive for cold intolerance.  Musculoskeletal: Positive for myalgias, joint swelling, arthralgias and gait problem.  Allergic/Immunologic: Negative for environmental allergies.  All other systems reviewed and are negative.   Past Medical History  Diagnosis Date  . Hypertension   . Arthritis   . Hepatitis C antibody test positive        . Renal insufficiency   . Anxiety   . Depression     h/o suicide attempts in the past  . GERD (gastroesophageal reflux disease)   . Polysubstance abuse     h/o  . Anemia   . Gout     Past Surgical History  Procedure Laterality Date  . Knee arthroscopy    .  Abdominal hysterectomy    . Cartilage removal      left knee  . Anterior cervical corpectomy N/A 06/09/2013    Procedure: C4 C5 Corpectomy with C6-7 Anterior cervical fusion/Peek cage/Trestle plate;  Surgeon: Otilio Connors, MD;  Location: [place] [facility];  Service: Neurosurgery;  Laterality: N/A;  Cervical Four, Cervical Five Corpectomy and Cervical six-seven Anterior cervical fusion/Peek cage Three-Five /Trestle Plating Cervical Three to Cervical Seven  . X-stop implantation    . Bilateral soo and appendectomy  2006  . Anterior cervical decomp/discectomy fusion N/A 08/12/2013    Procedure: Repair of Anterior  Cervical CSF LEAK, lumbar drain placement.;  Surgeon: Floyce Stakes, MD;  Location: [place];  Service: Neurosurgery;  Laterality: N/A;  . Colonoscopy N/A 07/04/2015    Procedure: COLONOSCOPY;  Surgeon: Rogene Houston, MD;  [facility];  Service: Endoscopy;  Laterality: N/A;  100    Family History  Problem Relation Age of Onset  . Diabetes Sister   . Diabetes Mother     deceased age 75  . Kidney failure Mother   . Alcohol abuse Father   . Colon cancer Neg Hx     Social History Social History  Substance Use Topics  . Smoking status: Never Smoker   .  Smokeless tobacco: None  . Alcohol Use: 1.8 oz/week    3 Cans of beer per week    Allergies  Allergen Reactions  . Aspirin     REACTION: Hx of overdose on this years ago - too scared to take. Tried to committ suicide.  . Oxycodone Nausea And Vomiting    Current Outpatient Prescriptions  Medication Sig Dispense Refill  . acetaminophen (TYLENOL) 500 MG tablet Take 500 mg by mouth every 8 (eight) hours as needed.    Marland Kitchen amLODipine (NORVASC) 10 MG tablet Take 0.5 tablets (5 mg total) by mouth daily.    . colchicine 0.6 MG tablet Take 1 tablet (0.6 mg total) by mouth 2 (two) times daily. 60 tablet 0  . dexamethasone (DECADRON) 4 MG tablet Take 1 tablet (4 mg total) by mouth 2 (two) times daily with a meal. 12 tablet 0  .  diclofenac sodium (VOLTAREN) 1 % GEL Apply 2 g topically 4 (four) times daily. To Left knee for arthritis. 4 Tube 1  . ferrous sulfate 325 (65 FE) MG tablet Take 325 mg by mouth daily with breakfast.    . HYDROcodone-acetaminophen (NORCO/VICODIN) 5-325 MG tablet Take 1 tablet by mouth every 4 (four) hours as needed for moderate pain (Must last 30 days.  Do not take and drive a car or use machinery.). 120 tablet 0  . methocarbamol (ROBAXIN) 500 MG tablet Take 1 tablet (500 mg total) by mouth 3 (three) times daily. (Patient not taking: Reported on 03/19/2016) 21 tablet 0   No current facility-administered medications for this visit.     Physical Exam  Blood pressure 92/58, pulse 78, temperature 97.7 F (36.5 C), resp. rate 16, height 5\' 2"  (1.575 m), weight 161 lb (73.029 kg).  Constitutional: overall normal hygiene, normal nutrition, well developed, normal grooming, normal body habitus. Assistive device: walker  Musculoskeletal: gait and station Limp left, muscle tone and strength are normal, no tremors or atrophy is present.  .  Neurological: coordination overall normal.  Deep tendon reflex/nerve stretch intact.  Sensation normal.  Cranial nerves II-XII intact.   Skin:   Normal with scars of right knee, no rashes. No psoriasis.  Psychiatric: Alert and oriented x 3.  Recent memory intact, remote memory unclear.  Normal mood and affect. Well groomed.  Good eye contact.  Cardiovascular: overall no swelling, no varicosities, no edema bilaterally, normal temperatures of the legs and arms, no clubbing, cyanosis and good capillary refill.  Lymphatic: palpation is normal.  Right Hip Exam  Right hip exam is normal.    Left Hip Exam   Tenderness  The patient is experiencing tenderness in the greater trochanter and lateral.  Range of Motion  The patient has normal left hip ROM.  Muscle Strength  The patient has normal left hip strength.   Other  Erythema: absent Scars:  absent Sensation: normal Pulse: present  Comments:  She is very tender over the lateral trochanteric area of the hip on the left.       The patient has been educated about the nature of the problem(s) and counseled on treatment options.  The patient appeared to understand what I have discussed and is in agreement with it.  Encounter Diagnoses  Name Primary?  . Trochanteric bursitis of left hip Yes  . Left knee pain   . Essential hypertension   . Chronic kidney disease, stage IV (severe) ([place])     PROCEDURE NOTE:  The patient request injection, verbal consent was obtained.  The  left trochanteric area of the hip was prepped appropriately after time out was performed.   Sterile technique was observed and injection of 1 cc of Depo-Medrol 40 mg with several cc's of plain xylocaine. Anesthesia was provided by ethyl chloride and a 20-gauge needle was used to inject the hip area. The injection was tolerated well.  A band aid dressing was applied.  The patient was advised to apply ice later today and tomorrow to the injection sight as needed.  PLAN Call if any problems.  Precautions discussed.  Continue current medications.   Return to clinic 1 month

## 2016-04-10 ENCOUNTER — Telehealth: Payer: Self-pay | Admitting: Orthopaedic Surgery

## 2016-04-10 MED ORDER — HYDROCODONE-ACETAMINOPHEN 5-325 MG PO TABS: 1.0000 | ORAL_TABLET | ORAL | Status: DC | PRN

## 2016-04-10 NOTE — Telephone Encounter (Signed)
Rx done. 

## 2016-04-10 NOTE — Telephone Encounter (Signed)
Hydrocodone-Acetaminophen  5/325mg  Qty 120 Tablets °

## 2016-04-22 ENCOUNTER — Ambulatory Visit (INDEPENDENT_AMBULATORY_CARE_PROVIDER_SITE_OTHER): Payer: Medicaid Other | Admitting: Orthopaedic Surgery

## 2016-04-22 ENCOUNTER — Ambulatory Visit: Payer: Medicaid Other

## 2016-04-22 ENCOUNTER — Encounter: Payer: Self-pay | Admitting: Orthopaedic Surgery

## 2016-04-22 VITALS — BP 172/107 | HR 77 | Temp 97.3°F | Resp 16 | Ht 62.0 in | Wt 157.0 lb

## 2016-04-22 DIAGNOSIS — M25562 Pain in left knee: Secondary | ICD-10-CM

## 2016-04-22 DIAGNOSIS — M7062 Trochanteric bursitis, left hip: Secondary | ICD-10-CM | POA: Diagnosis not present

## 2016-04-22 NOTE — Progress Notes (Signed)
CC:  My left hip is hurting again  She has recurrent left hip trochanteric bursitis.  It is tender.  It is localized.  She has no redness.  She has no numbness.  NV is intact.  ROM is good.  She uses a walker.  Encounter Diagnoses  Name Primary?  . Trochanteric bursitis of left hip   . Left knee pain Yes    PROCEDURE NOTE  I prepped the left hip and injected 1% Xylocaine and 1 cc Depomedrol by sterile technique tolerated well.  Return in one month.  Call if any problem.

## 2016-04-22 NOTE — Patient Instructions (Signed)
Continue medications

## 2016-05-13 ENCOUNTER — Telehealth: Payer: Self-pay | Admitting: Orthopaedic Surgery

## 2016-05-13 MED ORDER — HYDROCODONE-ACETAMINOPHEN 5-325 MG PO TABS: 1.0000 | ORAL_TABLET | ORAL | Status: DC | PRN

## 2016-05-13 NOTE — Telephone Encounter (Signed)
Patient requests refill on medication: HYDROcodone-acetaminophen (NORCO/VICODIN) 5-325 MG tablet [id number] - quantity 120.

## 2016-05-13 NOTE — Telephone Encounter (Signed)
Rx done. 

## 2016-05-22 ENCOUNTER — Encounter: Payer: Self-pay | Admitting: Orthopaedic Surgery

## 2016-05-22 ENCOUNTER — Ambulatory Visit: Payer: Medicaid Other | Admitting: Orthopaedic Surgery

## 2016-05-27 ENCOUNTER — Encounter: Payer: Self-pay | Admitting: Orthopaedic Surgery

## 2016-05-27 ENCOUNTER — Ambulatory Visit (INDEPENDENT_AMBULATORY_CARE_PROVIDER_SITE_OTHER): Payer: Medicaid Other | Admitting: Orthopaedic Surgery

## 2016-05-27 VITALS — BP 136/88 | HR 85 | Temp 97.0°F | Resp 16 | Ht 61.0 in | Wt 156.0 lb

## 2016-05-27 DIAGNOSIS — M7062 Trochanteric bursitis, left hip: Secondary | ICD-10-CM | POA: Diagnosis not present

## 2016-05-27 DIAGNOSIS — I10 Essential (primary) hypertension: Secondary | ICD-10-CM

## 2016-05-27 NOTE — Progress Notes (Signed)
CC:  My left hip hurts  She has recurrent left hip trochanteric hip pain.  She has no paresthesias.  She uses a cane.  The left hip trochanteric area is very tender.  She has no redness.  NV is intact, ROM is full.  Encounter Diagnoses  Name Primary?  . Trochanteric bursitis of left hip Yes  . Essential hypertension       PROCEDURE NOTE:  The patient request injection, verbal consent was obtained.  The left trochanteric area of the hip was prepped appropriately after time out was performed.   Sterile technique was observed and injection of 1 cc of Depo-Medrol 40 mg with several cc's of plain xylocaine. Anesthesia was provided by ethyl chloride and a 20-gauge needle was used to inject the hip area. The injection was tolerated well.  A band aid dressing was applied.  The patient was advised to apply ice later today and tomorrow to the injection sight as needed.  Return in one month.  While she was here, I trimmed her toe nails.  Electronically Signed Sanjuana Kava, MD 7/11/20179:41 AM

## 2016-06-16 ENCOUNTER — Encounter: Payer: Self-pay | Admitting: Orthopaedic Surgery

## 2016-06-16 ENCOUNTER — Telehealth: Payer: Self-pay | Admitting: Orthopaedic Surgery

## 2016-06-16 MED ORDER — HYDROCODONE-ACETAMINOPHEN 5-325 MG PO TABS: 1.0000 | ORAL_TABLET | ORAL | 0 refills | Status: DC | PRN

## 2016-06-16 NOTE — Telephone Encounter (Signed)
Patient called and requested a refill on Hydrocodone/Acetaminophen 5-325 gs.  Qty 120  Sig: Take 1 tablet by mouth every 4 (four) hours as needed for moderate pain (Must last 30 days. Do not take and drive a car or use machinery.).

## 2016-06-24 ENCOUNTER — Encounter: Payer: Self-pay | Admitting: Orthopaedic Surgery

## 2016-06-24 ENCOUNTER — Ambulatory Visit (INDEPENDENT_AMBULATORY_CARE_PROVIDER_SITE_OTHER): Payer: Medicaid Other | Admitting: Orthopaedic Surgery

## 2016-06-24 VITALS — BP 145/86 | HR 65 | Temp 97.2°F | Ht 61.0 in | Wt 159.2 lb

## 2016-06-24 DIAGNOSIS — M25561 Pain in right knee: Secondary | ICD-10-CM

## 2016-06-24 NOTE — Progress Notes (Signed)
CC:  I have pain of my right knee. I would like an injection.  The patient has chronic pain of the right knee.  There is no recent trauma.  There is no redness.  Injections in the past have helped.  The knee has no redness, has an effusion and crepitus present.  ROM of the knee is 0-105.  Impression:  Chronic knee pain right.  Return: 1 month   PROCEDURE NOTE:  The patient requests injections of the right knee , verbal consent was obtained.  The right knee was prepped appropriately after time out was performed.   Sterile technique was observed and injection of 1 cc of Depo-Medrol 40 mg with several cc's of plain xylocaine. Anesthesia was provided by ethyl chloride and a 20-gauge needle was used to inject the knee area. The injection was tolerated well.  A band aid dressing was applied.  The patient was advised to apply ice later today and tomorrow to the injection sight as needed.  Electronically Signed Sanjuana Kava, MD 8/8/20178:57 AM

## 2016-07-16 ENCOUNTER — Telehealth: Payer: Self-pay | Admitting: Orthopaedic Surgery

## 2016-07-16 MED ORDER — HYDROCODONE-ACETAMINOPHEN 5-325 MG PO TABS: 1.0000 | ORAL_TABLET | ORAL | 0 refills | Status: DC | PRN

## 2016-07-16 NOTE — Telephone Encounter (Signed)
Patient requests refill: HYDROcodone-acetaminophen (NORCO/VICODIN) 5-325 MG tablet   - insurance:  Medicaid

## 2016-07-17 ENCOUNTER — Other Ambulatory Visit (INDEPENDENT_AMBULATORY_CARE_PROVIDER_SITE_OTHER): Payer: Self-pay | Admitting: *Deleted

## 2016-07-17 ENCOUNTER — Telehealth (INDEPENDENT_AMBULATORY_CARE_PROVIDER_SITE_OTHER): Payer: Self-pay | Admitting: *Deleted

## 2016-07-17 ENCOUNTER — Encounter (INDEPENDENT_AMBULATORY_CARE_PROVIDER_SITE_OTHER): Payer: Self-pay | Admitting: Internal Medicine

## 2016-07-17 DIAGNOSIS — B182 Chronic viral hepatitis C: Secondary | ICD-10-CM

## 2016-07-17 NOTE — Telephone Encounter (Signed)
Needed to cancel the message

## 2016-07-17 NOTE — Telephone Encounter (Signed)
Patient presented to the office today . She states that her PCP told her she needed to be on the Hep C treatment.  Previous we worked on the Hep C for the patient ,as documented she told us that she was going to have knee surgery and she was going to contact us when this was over. We had not heard from patient until day.  Per Lelon Perla new lab values are needed,once we rec'c results, we will begin the prior authorization for the medication. Patient called and made aware.

## 2016-07-18 ENCOUNTER — Other Ambulatory Visit (HOSPITAL_COMMUNITY): Payer: Self-pay | Admitting: Internal Medicine

## 2016-07-18 DIAGNOSIS — N189 Chronic kidney disease, unspecified: Secondary | ICD-10-CM

## 2016-07-22 ENCOUNTER — Ambulatory Visit (INDEPENDENT_AMBULATORY_CARE_PROVIDER_SITE_OTHER): Payer: Medicaid Other | Admitting: Orthopaedic Surgery

## 2016-07-22 ENCOUNTER — Encounter: Payer: Self-pay | Admitting: Orthopaedic Surgery

## 2016-07-22 VITALS — BP 137/81 | HR 65 | Temp 97.5°F | Ht 61.0 in | Wt 161.0 lb

## 2016-07-22 DIAGNOSIS — M7062 Trochanteric bursitis, left hip: Secondary | ICD-10-CM

## 2016-07-22 NOTE — Progress Notes (Signed)
CC:  My left hip is tender  She has lateral tenderness of the left hip area.   She has no redness.  ROM is full.  Encounter Diagnosis  Name Primary?  . Trochanteric bursitis of left hip Yes   The left hip was prepped and then injected with 1 % xylocaine and 1 cc DepoMedrol 40 and the trochanteric bursa was injected by sterile technique tolerated well.  Return in one month.  I pared her nails while she was here.  Call if any problem.  Electronically Signed Sanjuana Kava, MD 9/5/20179:31 AM

## 2016-07-23 ENCOUNTER — Ambulatory Visit (HOSPITAL_COMMUNITY)
Admission: RE | Admit: 2016-07-23 | Discharge: 2016-07-23 | Disposition: A | Discharge status: Home / Self Care | Payer: Medicaid Other | Source: Ambulatory Visit | Attending: Internal Medicine | Admitting: Internal Medicine

## 2016-07-23 DIAGNOSIS — N261 Atrophy of kidney (terminal): Secondary | ICD-10-CM | POA: Diagnosis not present

## 2016-07-23 DIAGNOSIS — N189 Chronic kidney disease, unspecified: Secondary | ICD-10-CM | POA: Diagnosis not present

## 2016-07-29 ENCOUNTER — Telehealth: Payer: Self-pay | Admitting: Orthopaedic Surgery

## 2016-07-29 MED ORDER — HYDROCODONE-ACETAMINOPHEN 5-325 MG PO TABS: 1.0000 | ORAL_TABLET | Freq: Four times a day (QID) | ORAL | 0 refills | Status: DC | PRN

## 2016-07-29 NOTE — Telephone Encounter (Signed)
Patient called to request refill: HYDROcodone-acetaminophen (NORCO/VICODIN) 5-325 MG tablet.  Patient's insurance is Medicaid.

## 2016-08-18 ENCOUNTER — Other Ambulatory Visit: Payer: Self-pay | Admitting: *Deleted

## 2016-08-18 ENCOUNTER — Telehealth: Payer: Self-pay | Admitting: Orthopedic Surgery

## 2016-08-18 MED ORDER — HYDROCODONE-ACETAMINOPHEN 5-325 MG PO TABS: 1.0000 | ORAL_TABLET | Freq: Four times a day (QID) | ORAL | 0 refills | Status: DC | PRN

## 2016-08-18 NOTE — Telephone Encounter (Signed)
Patient requests a refill on Hydrocodone/Acetaminophen (Norco)  5-325 mgs.   Qty  52   Sig: Take 1 tablet by mouth every 6 (six) hours as needed for moderate pain (Must last 14 days.Do not take and drive a car or use machinery.).

## 2016-08-19 ENCOUNTER — Ambulatory Visit: Payer: Medicaid Other | Admitting: Orthopaedic Surgery

## 2016-08-26 ENCOUNTER — Encounter: Payer: Self-pay | Admitting: Orthopaedic Surgery

## 2016-08-26 ENCOUNTER — Ambulatory Visit (INDEPENDENT_AMBULATORY_CARE_PROVIDER_SITE_OTHER): Payer: Medicaid Other | Admitting: Orthopaedic Surgery

## 2016-08-26 VITALS — BP 130/81 | HR 77 | Ht 61.0 in | Wt 160.0 lb

## 2016-08-26 DIAGNOSIS — M7062 Trochanteric bursitis, left hip: Secondary | ICD-10-CM | POA: Diagnosis not present

## 2016-08-26 NOTE — Progress Notes (Signed)
CC:  My left hip is hurting again  HPI:  She has recurrent pain of the left hip trochanteric area.  She has no redness, no new trauma.  NV intact. ROM full.  Encounter Diagnosis  Name Primary?  . Trochanteric bursitis of left hip Yes       PROCEDURE NOTE:  The patient request injection, verbal consent was obtained.  The left trochanteric area of the hip was prepped appropriately after time out was performed.   Sterile technique was observed and injection of 1 cc of Depo-Medrol 40 mg with several cc's of plain xylocaine. Anesthesia was provided by ethyl chloride and a 20-gauge needle was used to inject the hip area. The injection was tolerated well.  A band aid dressing was applied.  The patient was advised to apply ice later today and tomorrow to the injection sight as needed.  Return in one month.  Electronically Signed Sanjuana Kava, MD 10/10/20178:42 AM

## 2016-09-01 ENCOUNTER — Telehealth: Payer: Self-pay | Admitting: Orthopaedic Surgery

## 2016-09-01 NOTE — Telephone Encounter (Signed)
Patient called for a refill on Hydrocodone/Acetaminophen 5-325 mgs.   Qty  52   Sig: Take 1 tablet by mouth every 6 (six) hours as needed for moderate pain (Must last 14 days.Do not take and drive a car or use machinery.).

## 2016-09-02 MED ORDER — HYDROCODONE-ACETAMINOPHEN 5-325 MG PO TABS: 1.0000 | ORAL_TABLET | Freq: Four times a day (QID) | ORAL | 0 refills | Status: DC | PRN

## 2016-09-15 ENCOUNTER — Telehealth: Payer: Self-pay | Admitting: Orthopaedic Surgery

## 2016-09-15 MED ORDER — HYDROCODONE-ACETAMINOPHEN 5-325 MG PO TABS: 1.0000 | ORAL_TABLET | Freq: Four times a day (QID) | ORAL | 0 refills | Status: DC | PRN

## 2016-09-15 NOTE — Telephone Encounter (Signed)
Hydrocodone-Acetaminophen  5/325mg   Qty 45 Tablets

## 2016-09-23 ENCOUNTER — Ambulatory Visit (INDEPENDENT_AMBULATORY_CARE_PROVIDER_SITE_OTHER): Payer: Medicaid Other | Admitting: Orthopaedic Surgery

## 2016-09-23 ENCOUNTER — Encounter: Payer: Self-pay | Admitting: Orthopaedic Surgery

## 2016-09-23 VITALS — BP 137/96 | HR 84 | Temp 97.2°F | Ht 61.0 in | Wt 161.0 lb

## 2016-09-23 DIAGNOSIS — I10 Essential (primary) hypertension: Secondary | ICD-10-CM

## 2016-09-23 DIAGNOSIS — G8929 Other chronic pain: Secondary | ICD-10-CM

## 2016-09-23 DIAGNOSIS — M7062 Trochanteric bursitis, left hip: Secondary | ICD-10-CM | POA: Diagnosis not present

## 2016-09-23 DIAGNOSIS — N184 Chronic kidney disease, stage 4 (severe): Secondary | ICD-10-CM

## 2016-09-23 DIAGNOSIS — M25561 Pain in right knee: Secondary | ICD-10-CM

## 2016-09-23 NOTE — Progress Notes (Signed)
CC:  My left hip is hurting again  She has recurrent left hip trochanteric pain.  She has no new trauma.  She uses a cane.  She has tenderness of the left hip trochanteric area with no redness.  ROM of the left hip is full.    Encounter Diagnoses  Name Primary?  . Trochanteric bursitis of left hip Yes  . Chronic pain of right knee   . Essential hypertension   . Chronic kidney disease, stage IV (severe) ([place])     PROCEDURE NOTE:  The patient request injection, verbal consent was obtained.  The left trochanteric area of the hip was prepped appropriately after time out was performed.   Sterile technique was observed and injection of 1 cc of Depo-Medrol 40 mg with several cc's of plain xylocaine. Anesthesia was provided by ethyl chloride and a 20-gauge needle was used to inject the hip area. The injection was tolerated well.  A band aid dressing was applied.  The patient was advised to apply ice later today and tomorrow to the injection sight as needed.  Electronically Signed Sanjuana Kava, MD 11/7/20179:39 AM

## 2016-09-29 ENCOUNTER — Other Ambulatory Visit: Payer: Self-pay | Admitting: *Deleted

## 2016-09-29 ENCOUNTER — Telehealth: Payer: Self-pay | Admitting: Orthopaedic Surgery

## 2016-09-29 MED ORDER — HYDROCODONE-ACETAMINOPHEN 5-325 MG PO TABS: 1.0000 | ORAL_TABLET | Freq: Four times a day (QID) | ORAL | 0 refills | Status: DC | PRN

## 2016-09-29 NOTE — Telephone Encounter (Signed)
Hydrocodone-Acetaminophen  5/325mg   Qty 40 Tablets  Take 1 tablet by mouth every 6 (six) hours as needed for moderate pain (Must last 14 days. Do not take and drive a car or use machinery.).

## 2016-10-11 ENCOUNTER — Emergency Department (HOSPITAL_COMMUNITY)
Admission: EM | Admit: 2016-10-11 | Discharge: 2016-10-11 | Disposition: A | Discharge status: Home / Self Care | Payer: Medicaid Other | Attending: Emergency Medicine | Admitting: Emergency Medicine

## 2016-10-11 ENCOUNTER — Emergency Department (HOSPITAL_COMMUNITY): Payer: Medicaid Other

## 2016-10-11 ENCOUNTER — Encounter (HOSPITAL_COMMUNITY): Payer: Self-pay | Admitting: Emergency Medicine

## 2016-10-11 DIAGNOSIS — M10071 Idiopathic gout, right ankle and foot: Secondary | ICD-10-CM | POA: Insufficient documentation

## 2016-10-11 DIAGNOSIS — Z79899 Other long term (current) drug therapy: Secondary | ICD-10-CM | POA: Diagnosis not present

## 2016-10-11 DIAGNOSIS — N184 Chronic kidney disease, stage 4 (severe): Secondary | ICD-10-CM | POA: Diagnosis not present

## 2016-10-11 DIAGNOSIS — I129 Hypertensive chronic kidney disease with stage 1 through stage 4 chronic kidney disease, or unspecified chronic kidney disease: Secondary | ICD-10-CM | POA: Diagnosis not present

## 2016-10-11 DIAGNOSIS — M25571 Pain in right ankle and joints of right foot: Secondary | ICD-10-CM | POA: Diagnosis present

## 2016-10-11 DIAGNOSIS — M109 Gout, unspecified: Secondary | ICD-10-CM

## 2016-10-11 HISTORY — DX: Repeated falls: R29.6

## 2016-10-11 HISTORY — DX: Other chronic pain: G89.29

## 2016-10-11 HISTORY — DX: Pain in unspecified knee: M25.569

## 2016-10-11 HISTORY — DX: Pain in unspecified hip: M25.559

## 2016-10-11 MED ORDER — PREDNISONE 50 MG PO TABS
60.0000 mg | ORAL_TABLET | Freq: Once | ORAL | Status: AC
  Administered 2016-10-11: 60 mg via ORAL
  Filled 2016-10-11: qty 1

## 2016-10-11 MED ORDER — PREDNISONE 20 MG PO TABS: 40.0000 mg | ORAL_TABLET | Freq: Every day | ORAL | 0 refills | Status: DC

## 2016-10-11 NOTE — ED Provider Notes (Signed)
[facility] DEPT Provider Note   CSN: [id number] Arrival date & time: 10/11/16  0541     History   Chief Complaint Chief Complaint  Patient presents with  . Ankle Pain    HPI Colleen Nunez is a 60 y.o. female.  HPI  Pt was seen at 69. Per pt, c/o gradual onset and persistence of constant right ankle "pain" for the past several days. Describes her pain as "my gout." Endorses hx of chronic pain. Denies injury. Denies fevers, no focal motor weakness, no tingling/numbness in extremities.    [facility] Past Medical History:  Diagnosis Date  . Anemia   . Anxiety   . Arthritis   . Chronic hip pain   . Chronic knee pain   . Depression    h/o suicide attempts in the past  . GERD (gastroesophageal reflux disease)   . Gout   . Hepatitis C antibody test positive       . Hypertension   . Polysubstance abuse    h/o  . Recurrent falls   . Renal insufficiency     Patient Active Problem List   Diagnosis Date Noted  . Hepatitis C 03/02/2015  . Spondylosis, cervical, with myelopathy 09/21/2013  . Quadriplegia ([place]) 09/21/2013  . Osteoarthritis of left knee 09/21/2013  . Hypokalemia 08/16/2013  . AKI (acute kidney injury) ([place]) 08/16/2013  . UTI (lower urinary tract infection) 08/07/2013  . Nausea & vomiting 08/07/2013  . Chronic kidney disease, stage IV (severe) ([place]) 08/07/2013  . Cervical myelopathy ([place]) 08/07/2013  . GERD 06/12/2009  . CHEST PAIN 06/12/2009  . LEG PAIN, BILATERAL 05/28/2009  . OBESITY 12/17/2007  . DYSPHAGIA UNSPECIFIED 12/03/2007  . ANEMIA, NORMOCYTIC 07/13/2007  . ALCOHOL ABUSE 07/13/2007  . RENAL DISEASE, CHRONIC, STAGE II 07/13/2007  . ANXIETY 06/29/2007  . DEPRESSION 06/29/2007  . Essential hypertension 06/29/2007  . CONSTIPATION 06/29/2007  . ARTHRITIS 06/29/2007  . MALAISE AND FATIGUE 06/29/2007    Past Surgical History:  Procedure Laterality Date  . ABDOMINAL HYSTERECTOMY    . ANTERIOR CERVICAL CORPECTOMY N/A 06/09/2013   Procedure: C4 C5 Corpectomy with C6-7 Anterior cervical fusion/Peek cage/Trestle plate;  Surgeon: Otilio Connors, MD;  Location: [facility];  Service: Neurosurgery;  Laterality: N/A;  Cervical Four, Cervical Five Corpectomy and Cervical six-seven Anterior cervical fusion/Peek cage Three-Five /Trestle Plating Cervical Three to Cervical Seven  . ANTERIOR CERVICAL DECOMP/DISCECTOMY FUSION N/A 08/12/2013   Procedure: Repair of Anterior  Cervical CSF LEAK, lumbar drain placement.;  Surgeon: Floyce Stakes, MD;  Location: [facility];  Service: Neurosurgery;  Laterality: N/A;  . bilateral SOO and appendectomy  2006  . cartilage removal     left knee  . COLONOSCOPY N/A 07/04/2015   Procedure: COLONOSCOPY;  Surgeon: Rogene Houston, MD;  [facility];  Service: Endoscopy;  Laterality: N/A;  100  . KNEE ARTHROSCOPY    . X-STOP IMPLANTATION        Home Medications    Prior to Admission medications   Medication Sig Start Date End Date Taking? Authorizing Provider  acetaminophen (TYLENOL) 500 MG tablet Take 500 mg by mouth every 8 (eight) hours as needed.   Yes Historical Provider, MD  amLODipine (NORVASC) 10 MG tablet Take 0.5 tablets (5 mg total) by mouth daily. 09/02/13  Yes Ivan Anchors Love, PA-C  diclofenac sodium (VOLTAREN) 1 % GEL Apply 2 g topically 4 (four) times daily. To Left knee for arthritis. 09/02/13  Yes Bary Leriche, PA-C  diphenhydrAMINE (BENADRYL) 25 MG tablet Take 25 mg by mouth every 6 (six) hours as needed.   Yes Historical Provider, MD  ferrous sulfate 325 (65 FE) MG tablet Take 325 mg by mouth daily with breakfast.   Yes Historical Provider, MD  HYDROcodone-acetaminophen (NORCO/VICODIN) 5-325 MG tablet Take 1 tablet by mouth every 6 (six) hours as needed for moderate pain (Must last 14 days.Do not take and drive a car or use machinery.). 09/29/16  Yes Carole Civil, MD  methocarbamol (ROBAXIN) 500 MG tablet Take 1 tablet (500 mg total) by mouth 3 (three) times daily.  12/22/15  Yes Lily Kocher, PA-C  colchicine 0.6 MG tablet Take 1 tablet (0.6 mg total) by mouth 2 (two) times daily. 09/06/13   Meredith Staggers, MD  dexamethasone (DECADRON) 4 MG tablet Take 1 tablet (4 mg total) by mouth 2 (two) times daily with a meal. 12/22/15   Lily Kocher, PA-C    Family History Family History  Problem Relation Age of Onset  . Diabetes Sister   . Diabetes Mother     deceased age 76  . Kidney failure Mother   . Alcohol abuse Father   . Colon cancer Neg Hx     Social History Social History  Substance Use Topics  . Smoking status: Never Smoker  . Smokeless tobacco: Never Used  . Alcohol use 1.8 oz/week    3 Cans of beer per week     Allergies   Aspirin and Oxycodone   Review of Systems Review of Systems ROS: Statement: All systems negative except as marked or noted in the HPI; Constitutional: Negative for fever and chills. ; ; Eyes: Negative for eye pain, redness and discharge. ; ; ENMT: Negative for ear pain, hoarseness, nasal congestion, sinus pressure and sore throat. ; ; Cardiovascular: Negative for chest pain, palpitations, diaphoresis, dyspnea and peripheral edema. ; ; Respiratory: Negative for cough, wheezing and stridor. ; ; Gastrointestinal: Negative for nausea, vomiting, diarrhea, abdominal pain, blood in stool, hematemesis, jaundice and rectal bleeding. . ; ; Genitourinary: Negative for dysuria, flank pain and hematuria. ; ; Musculoskeletal: +ankle pain. Negative for back pain and neck pain. Negative for swelling and trauma.; ; Skin: Negative for pruritus, rash, abrasions, blisters, bruising and skin lesion.; ; Neuro: Negative for headache, lightheadedness and neck stiffness. Negative for weakness, altered level of consciousness, altered mental status, extremity weakness, paresthesias, involuntary movement, seizure and syncope.      Physical Exam Updated Vital Signs BP 138/94 (BP Location: Left Arm)   Pulse 87   Temp 98.9 F (37.2 C) (Oral)    Resp 20   Ht 5\' 1"  (1.549 m)   Wt 160 lb (72.6 kg)   SpO2 100%   BMI 30.23 kg/m   Physical Exam 0725: Physical examination:  Nursing notes reviewed; Vital signs and O2 SAT reviewed;  Constitutional: Well developed, Well nourished, Well hydrated, In no acute distress; Head:  Normocephalic, atraumatic; Eyes: EOMI, PERRL, No scleral icterus; ENMT: Mouth and pharynx normal, Mucous membranes moist; Neck: Supple, Full range of motion, No lymphadenopathy; Cardiovascular: Regular rate and rhythm, No gallop; Respiratory: Breath sounds clear & equal bilaterally, No wheezes.  Speaking full sentences with ease, Normal respiratory effort/excursion; Chest: Nontender, Movement normal; Abdomen: Soft, Nontender, Nondistended, Normal bowel sounds; Genitourinary: No CVA tenderness; Extremities: Pulses normal, +generalized tenderness to palp right lateral and medial maleolar areas without edema, erythema, ecchymosis, warmth, deformity. NMS intact right foot, strong pedal pp, LE muscle compartments soft.  No right proximal  fibular head tenderness, no knee tenderness, no foot tenderness. +plantarflexion of right foot w/calf squeeze.  No palpable gap right Achilles's tendon.  No calf tenderness, edema or asymmetry.; Neuro: AA&Ox3, Major CN grossly intact.  Speech clear. No gross focal motor or sensory deficits in extremities.; Skin: Color normal, Warm, Dry.    ED Treatments / Results  Labs (all labs ordered are listed, but only abnormal results are displayed)   EKG  EKG Interpretation None       Radiology   Procedures Procedures (including critical care time)  Medications Ordered in ED Medications - No data to display   Initial Impression / Assessment and Plan / ED Course  I have reviewed the triage vital signs and the nursing notes.  Pertinent labs & imaging results that were available during my care of the patient were reviewed by me and considered in my medical decision making (see chart for  details).  MDM Reviewed: previous chart, vitals and nursing note Interpretation: x-ray   Dg Ankle Complete Right Result Date: 10/11/2016 CLINICAL DATA:  60 year old female with right ankle pain since yesterday. Clinical history of gout. No known injury. EXAM: RIGHT ANKLE - COMPLETE 3+ VIEW COMPARISON:  Prior radiographs of the right foot and ankle 08/08/2013 FINDINGS: Positive for small ankle joint effusion. No evidence of acute fracture or malalignment. Degenerative osteoarthritis in the midfoot at the talonavicular joint. Similarly, there is degenerative change at the due to tibiofibular interface an the interface of the fibula and talus which are unchanged dating back to 2014. Small osteochondral defect in the medial aspect of the tailor dome. Ossification of the Achilles tendon insertion consistent with chronic tendinopathy or remote injury. No focal soft tissue abnormality. IMPRESSION: 1. Small ankle joint effusion. This may be degenerative or inflammatory given the patient's clinical history of gout. Infection cannot be excluded radiographically although there are no new destructive changes. 2. Chronic degenerative osteoarthritis at the tibiotalar, fibulotalar and talonavicular joints. 3. Ossification in the Achilles tendon at its insertion on the calcaneus suggests chronic tendinopathy versus remote injury. Electronically Signed   By: Jacqulynn Cadet M.D.   On: 10/11/2016 08:16     0730: [place] Controlled Substance Database accessed: pt filled #40, hydrocodone/APAP on 09/30/16. Will not dose further narcotics; pt aware.  0820:  Will tx for gout; doubt septic joint without swelling/warmth/fever. Pt already has crutches and Ortho MD. Dx and testing d/w pt.  Questions answered.  Verb understanding, agreeable to d/c home with outpt f/u.    Final Clinical Impressions(s) / ED Diagnoses   Final diagnoses:  None    New Prescriptions New Prescriptions   No medications on file     Francine Graven, DO 10/14/16 1624

## 2016-10-11 NOTE — ED Triage Notes (Signed)
When patient woke up yesterday morning, she could not bear weight on right ankle and had to drag her leg, denies any injury.

## 2016-10-11 NOTE — Discharge Instructions (Signed)
Take the prescription as directed.  Apply moist heat or ice to the area(s) of discomfort, for 15 minutes at a time, several times per day for the next few days.  Do not fall asleep on a heating or ice pack.  Use your crutches and wear the ankle support for until you are seen in follow up. Call your regular Orthopedic doctor on Monday to schedule a follow up appointment in the next 2 days.  Return to the Emergency Department immediately if worsening.

## 2016-10-14 ENCOUNTER — Telehealth: Payer: Self-pay | Admitting: Orthopaedic Surgery

## 2016-10-14 MED ORDER — HYDROCODONE-ACETAMINOPHEN 5-325 MG PO TABS: 1.0000 | ORAL_TABLET | Freq: Four times a day (QID) | ORAL | 0 refills | Status: DC | PRN

## 2016-10-14 NOTE — Telephone Encounter (Signed)
Patient requests refill on Hydrocodone/Acetaminophen 5-325  Mgs.   Qty  40  Take 1 tablet by mouth every 6 (six) hours as needed for moderate pain (Must last 14 days. Do not take and drive a car or use machinery.).

## 2016-10-16 ENCOUNTER — Encounter: Payer: Self-pay | Admitting: Orthopaedic Surgery

## 2016-10-16 ENCOUNTER — Ambulatory Visit (INDEPENDENT_AMBULATORY_CARE_PROVIDER_SITE_OTHER): Payer: Medicaid Other | Admitting: Orthopaedic Surgery

## 2016-10-16 VITALS — BP 137/94 | HR 82 | Temp 97.2°F | Ht 62.0 in | Wt 166.0 lb

## 2016-10-16 DIAGNOSIS — G8929 Other chronic pain: Secondary | ICD-10-CM | POA: Diagnosis not present

## 2016-10-16 DIAGNOSIS — M25561 Pain in right knee: Secondary | ICD-10-CM | POA: Diagnosis not present

## 2016-10-16 NOTE — Progress Notes (Signed)
CC:  I have pain of my right knee. I would like an injection.  The patient has chronic pain of the right knee.  There is no recent trauma.  There is no redness.  Injections in the past have helped.  The knee has no redness, has an effusion and crepitus present.  ROM of the right knee is 0-105.  Impression:  Chronic knee pain right  Return: 6 weeks  PROCEDURE NOTE:  The patient requests injections of the right knee , verbal consent was obtained.  The right knee was prepped appropriately after time out was performed.   Sterile technique was observed and injection of 1 cc of Depo-Medrol 40 mg with several cc's of plain xylocaine. Anesthesia was provided by ethyl chloride and a 20-gauge needle was used to inject the knee area. The injection was tolerated well.  A band aid dressing was applied.  The patient was advised to apply ice later today and tomorrow to the injection sight as needed.  Electronically Signed Sanjuana Kava, MD 11/30/20179:23 AM

## 2016-10-21 ENCOUNTER — Ambulatory Visit: Payer: Medicaid Other | Admitting: Orthopaedic Surgery

## 2016-10-23 ENCOUNTER — Other Ambulatory Visit (HOSPITAL_COMMUNITY): Payer: Self-pay | Admitting: Internal Medicine

## 2016-10-23 DIAGNOSIS — Z1231 Encounter for screening mammogram for malignant neoplasm of breast: Secondary | ICD-10-CM

## 2016-10-28 ENCOUNTER — Telehealth: Payer: Self-pay | Admitting: Orthopaedic Surgery

## 2016-10-28 MED ORDER — HYDROCODONE-ACETAMINOPHEN 5-325 MG PO TABS: 1.0000 | ORAL_TABLET | Freq: Four times a day (QID) | ORAL | 0 refills | Status: DC | PRN

## 2016-10-28 NOTE — Telephone Encounter (Signed)
Patient called to request refill: HYDROcodone-acetaminophen (NORCO/VICODIN) 5-325 MG tablet 40 tablet

## 2016-11-03 ENCOUNTER — Ambulatory Visit (HOSPITAL_COMMUNITY)
Admission: RE | Admit: 2016-11-03 | Discharge: 2016-11-03 | Disposition: A | Discharge status: Home / Self Care | Payer: Medicaid Other | Source: Ambulatory Visit | Attending: Internal Medicine | Admitting: Internal Medicine

## 2016-11-03 DIAGNOSIS — Z1231 Encounter for screening mammogram for malignant neoplasm of breast: Secondary | ICD-10-CM | POA: Insufficient documentation

## 2016-11-04 ENCOUNTER — Telehealth: Payer: Self-pay | Admitting: Orthopaedic Surgery

## 2016-11-04 MED ORDER — HYDROCODONE-ACETAMINOPHEN 5-325 MG PO TABS: 1.0000 | ORAL_TABLET | Freq: Four times a day (QID) | ORAL | 0 refills | Status: DC | PRN

## 2016-11-04 NOTE — Telephone Encounter (Signed)
Hydrocodone-Acetaminophen  5/325mg   Qty 40 Tablets

## 2016-11-26 ENCOUNTER — Other Ambulatory Visit: Payer: Self-pay | Admitting: *Deleted

## 2016-11-26 ENCOUNTER — Telehealth: Payer: Self-pay | Admitting: Orthopaedic Surgery

## 2016-11-26 MED ORDER — HYDROCODONE-ACETAMINOPHEN 5-325 MG PO TABS: 1.0000 | ORAL_TABLET | Freq: Four times a day (QID) | ORAL | 0 refills | Status: DC | PRN

## 2016-11-26 NOTE — Telephone Encounter (Signed)
Dr. Brooke Bonito patient requests a refill on Hydrocodone/Acetaminophen (Norco)  5-325  Mgs.    Qty  35  Sig: Take 1 tablet by mouth every 6 (six) hours as needed for moderate pain (Must last 14 days.Do not take and drive a car or use machinery.).

## 2016-11-27 ENCOUNTER — Ambulatory Visit: Payer: Medicaid Other | Admitting: Orthopaedic Surgery

## 2016-12-03 ENCOUNTER — Ambulatory Visit: Payer: Medicaid Other | Admitting: Orthopaedic Surgery

## 2016-12-11 ENCOUNTER — Encounter: Payer: Self-pay | Admitting: Orthopaedic Surgery

## 2016-12-11 ENCOUNTER — Ambulatory Visit (INDEPENDENT_AMBULATORY_CARE_PROVIDER_SITE_OTHER): Payer: Medicaid Other | Admitting: Orthopaedic Surgery

## 2016-12-11 VITALS — BP 138/86 | HR 76 | Temp 97.2°F | Ht 62.0 in | Wt 167.0 lb

## 2016-12-11 DIAGNOSIS — M7062 Trochanteric bursitis, left hip: Secondary | ICD-10-CM

## 2016-12-11 MED ORDER — HYDROCODONE-ACETAMINOPHEN 5-325 MG PO TABS: 1.0000 | ORAL_TABLET | Freq: Four times a day (QID) | ORAL | 0 refills | Status: DC | PRN

## 2016-12-11 NOTE — Progress Notes (Signed)
PROCEDURE NOTE:  The patient request injection, verbal consent was obtained.  The left trochanteric area of the hip was prepped appropriately after time out was performed.   Sterile technique was observed and injection of 1 cc of Depo-Medrol 40 mg with several cc's of plain xylocaine. Anesthesia was provided by ethyl chloride and a 20-gauge needle was used to inject the hip area. The injection was tolerated well.  A band aid dressing was applied.  The patient was advised to apply ice later today and tomorrow to the injection sight as needed.  Return in six weeks.  I pared her toe nails while she was here.  Pain medicine was given after first checking the state narcotic site.  Electronically Signed Sanjuana Kava, MD 1/25/20189:31 AM

## 2016-12-24 ENCOUNTER — Telehealth: Payer: Self-pay | Admitting: *Deleted

## 2016-12-24 ENCOUNTER — Telehealth: Payer: Self-pay | Admitting: Orthopaedic Surgery

## 2016-12-24 NOTE — Telephone Encounter (Signed)
Patient informed. 

## 2016-12-24 NOTE — Telephone Encounter (Signed)
Patient is requesting refill on Hydrocodone 5-325 mg QTY: 35 tablets

## 2016-12-24 NOTE — Telephone Encounter (Signed)
Pt needs refill of pain meds please

## 2016-12-24 NOTE — Telephone Encounter (Signed)
No.  Needs to get from family doctor.

## 2016-12-24 NOTE — Telephone Encounter (Signed)
She has medicaid.  She needs to get medicine from family doctor.

## 2017-01-28 ENCOUNTER — Ambulatory Visit (INDEPENDENT_AMBULATORY_CARE_PROVIDER_SITE_OTHER): Payer: Medicaid Other | Admitting: Orthopaedic Surgery

## 2017-01-28 VITALS — BP 124/85 | HR 67 | Temp 97.3°F | Ht 62.0 in | Wt 173.0 lb

## 2017-01-28 DIAGNOSIS — M7062 Trochanteric bursitis, left hip: Secondary | ICD-10-CM | POA: Diagnosis not present

## 2017-01-28 NOTE — Progress Notes (Signed)
PROCEDURE NOTE:  The patient request injection, verbal consent was obtained.  The left trochanteric area of the hip was prepped appropriately after time out was performed.   Sterile technique was observed and injection of 1 cc of Depo-Medrol 40 mg with several cc's of plain xylocaine. Anesthesia was provided by ethyl chloride and a 20-gauge needle was used to inject the hip area. The injection was tolerated well.  A band aid dressing was applied.  The patient was advised to apply ice later today and tomorrow to the injection sight as needed.  Encounter Diagnosis  Name Primary?  . Trochanteric bursitis of left hip Yes   I will see her in six weeks.  Call if any problem.  Electronically Signed Sanjuana Kava, MD 3/14/20189:48 AM

## 2017-02-03 ENCOUNTER — Telehealth: Payer: Self-pay | Admitting: Orthopaedic Surgery

## 2017-02-03 NOTE — Telephone Encounter (Signed)
Colleen Nunez called stating that she is having PT at [facility].  She said that while there, she showed her left ring finger to the therapist.  They discussed possibly putting a splint or a brace on it.    Colleen Nunez says she told you about her finger and even showed it to you in passing while she was here in the office.  She wants you to write a prescription or order for her to get this splint or brace for her left ring finger.  I told her that I would ask but I wasn't sure if this could be done.  [facility] in [place], [place]  Phone # [phone] Fax #      [phone]

## 2017-02-03 NOTE — Telephone Encounter (Signed)
done

## 2017-03-11 ENCOUNTER — Ambulatory Visit: Payer: Medicaid Other | Admitting: Orthopaedic Surgery

## 2017-03-12 ENCOUNTER — Ambulatory Visit (INDEPENDENT_AMBULATORY_CARE_PROVIDER_SITE_OTHER): Payer: Medicaid Other | Admitting: Orthopaedic Surgery

## 2017-03-12 ENCOUNTER — Encounter: Payer: Self-pay | Admitting: Orthopaedic Surgery

## 2017-03-12 VITALS — BP 113/72 | HR 71 | Temp 98.1°F | Ht 62.0 in | Wt 177.0 lb

## 2017-03-12 DIAGNOSIS — M7062 Trochanteric bursitis, left hip: Secondary | ICD-10-CM

## 2017-03-12 NOTE — Progress Notes (Signed)
PROCEDURE NOTE:  The patient request injection, verbal consent was obtained.  The left trochanteric area of the hip was prepped appropriately after time out was performed.   Sterile technique was observed and injection of 1 cc of Depo-Medrol 40 mg with several cc's of plain xylocaine. Anesthesia was provided by ethyl chloride and a 20-gauge needle was used to inject the hip area. The injection was tolerated well.  A band aid dressing was applied.  The patient was advised to apply ice later today and tomorrow to the injection sight as needed.  I also pared her toe nails while here.  Return in one month.  Call if any problem.  Electronically Signed Sanjuana Kava, MD 4/26/20181:48 PM

## 2017-03-31 DIAGNOSIS — M545 Low back pain, unspecified: Secondary | ICD-10-CM | POA: Insufficient documentation

## 2017-03-31 DIAGNOSIS — Z981 Arthrodesis status: Secondary | ICD-10-CM | POA: Insufficient documentation

## 2017-04-09 ENCOUNTER — Ambulatory Visit (INDEPENDENT_AMBULATORY_CARE_PROVIDER_SITE_OTHER): Payer: Medicaid Other | Admitting: Orthopaedic Surgery

## 2017-04-09 DIAGNOSIS — I10 Essential (primary) hypertension: Secondary | ICD-10-CM

## 2017-04-09 DIAGNOSIS — M7062 Trochanteric bursitis, left hip: Secondary | ICD-10-CM | POA: Diagnosis not present

## 2017-04-09 NOTE — Progress Notes (Signed)
PROCEDURE NOTE:  The patient request injection, verbal consent was obtained.  The left trochanteric area of the hip was prepped appropriately after time out was performed.   Sterile technique was observed and injection of 1 cc of Depo-Medrol 40 mg with several cc's of plain xylocaine. Anesthesia was provided by ethyl chloride and a 20-gauge needle was used to inject the hip area. The injection was tolerated well.  A band aid dressing was applied.  The patient was advised to apply ice later today and tomorrow to the injection sight as needed.  Encounter Diagnoses  Name Primary?  . Trochanteric bursitis of left hip Yes  . Essential hypertension    I will see her as needed.  Electronically Signed Sanjuana Kava, MD 5/24/20181:41 PM

## 2017-05-21 ENCOUNTER — Encounter (INDEPENDENT_AMBULATORY_CARE_PROVIDER_SITE_OTHER): Payer: Self-pay | Admitting: *Deleted

## 2017-05-21 ENCOUNTER — Ambulatory Visit (INDEPENDENT_AMBULATORY_CARE_PROVIDER_SITE_OTHER): Payer: Medicaid Other | Admitting: Internal Medicine

## 2017-05-21 ENCOUNTER — Encounter (INDEPENDENT_AMBULATORY_CARE_PROVIDER_SITE_OTHER): Payer: Self-pay | Admitting: Internal Medicine

## 2017-05-21 ENCOUNTER — Encounter (INDEPENDENT_AMBULATORY_CARE_PROVIDER_SITE_OTHER): Payer: Self-pay

## 2017-05-21 VITALS — BP 120/80 | HR 56 | Temp 98.0°F | Ht 62.0 in | Wt 168.3 lb

## 2017-05-21 DIAGNOSIS — B1710 Acute hepatitis C without hepatic coma: Secondary | ICD-10-CM

## 2017-05-21 NOTE — Progress Notes (Addendum)
Subjective:    Patient ID: Colleen Nunez, female    DOB: January 26, 1956, 61 y.o.   MRN: [id number]  HPI Referred by Dr Legrand Rams for Hepatitis C. Has been seen by our office for same but postponed tx due to having multiple surgeries. She is Genotype 1A.  Hep C quaint positive. 02/27/2015 [place] Elastrography F0-F1 03/03/2015 Genotype 1A.  Has done cocaine in the past (years ago). She drinks a beer occasionally. Appetite for the most part is okay. She thinks she may have lost a small amt of weight. Has a BM x 1 week.    12/28/2014 Hep C Ab reactive. Hep B surface Ag negative. ANA negative Hx of chronic renal insufficiency Last colonoscopy in 2016 (average risk)  59mm cecal polyp. Polyp not a adenoma. Next colonoscopy in 10 years Review of Systems Past Medical History:  Diagnosis Date  . Anemia   . Anxiety   . Arthritis   . Chronic hip pain   . Chronic knee pain   . Depression    h/o suicide attempts in the past  . GERD (gastroesophageal reflux disease)   . Gout   . Hepatitis C antibody test positive       . Hypertension   . Polysubstance abuse    h/o  . Recurrent falls   . Renal insufficiency     Past Surgical History:  Procedure Laterality Date  . ABDOMINAL HYSTERECTOMY    . ANTERIOR CERVICAL CORPECTOMY N/A 06/09/2013   Procedure: C4 C5 Corpectomy with C6-7 Anterior cervical fusion/Peek cage/Trestle plate;  Surgeon: Otilio Connors, MD;  Location: [facility];  Service: Neurosurgery;  Laterality: N/A;  Cervical Four, Cervical Five Corpectomy and Cervical six-seven Anterior cervical fusion/Peek cage Three-Five /Trestle Plating Cervical Three to Cervical Seven  . ANTERIOR CERVICAL DECOMP/DISCECTOMY FUSION N/A 08/12/2013   Procedure: Repair of Anterior  Cervical CSF LEAK, lumbar drain placement.;  Surgeon: Floyce Stakes, MD;  Location: [facility];  Service: Neurosurgery;  Laterality: N/A;  . bilateral SOO and appendectomy  2006  . cartilage removal     left knee  . COLONOSCOPY N/A  07/04/2015   Procedure: COLONOSCOPY;  Surgeon: Rogene Houston, MD;  [facility];  Service: Endoscopy;  Laterality: N/A;  100  . KNEE ARTHROSCOPY    . X-STOP IMPLANTATION      Allergies  Allergen Reactions  . Aspirin     REACTION: Hx of overdose on this years ago - too scared to take. Tried to committ suicide.  . Oxycodone Nausea And Vomiting    Current Outpatient Prescriptions on File Prior to Visit  Medication Sig Dispense Refill  . acetaminophen (TYLENOL) 500 MG tablet Take 500 mg by mouth every 8 (eight) hours as needed.    Marland Kitchen amLODipine (NORVASC) 10 MG tablet Take 0.5 tablets (5 mg total) by mouth daily.    . colchicine 0.6 MG tablet Take 1 tablet (0.6 mg total) by mouth 2 (two) times daily. 60 tablet 0  . dexamethasone (DECADRON) 4 MG tablet Take 1 tablet (4 mg total) by mouth 2 (two) times daily with a meal. 12 tablet 0  . diclofenac sodium (VOLTAREN) 1 % GEL Apply 2 g topically 4 (four) times daily. To Left knee for arthritis. 4 Tube 1  . diphenhydrAMINE (BENADRYL) 25 MG tablet Take 25 mg by mouth every 6 (six) hours as needed.    . ferrous sulfate 325 (65 FE) MG tablet Take 325 mg by mouth daily with breakfast.    .  HYDROcodone-acetaminophen (NORCO/VICODIN) 5-325 MG tablet Take 1 tablet by mouth every 6 (six) hours as needed for moderate pain (Must last 14 days.Do not take and drive a car or use machinery.). 35 tablet 0  . methocarbamol (ROBAXIN) 500 MG tablet Take 1 tablet (500 mg total) by mouth 3 (three) times daily. 21 tablet 0  . predniSONE (DELTASONE) 20 MG tablet Take 2 tablets (40 mg total) by mouth daily. 10 tablet 0   No current facility-administered medications on file prior to visit.         Objective:   Physical Exam Blood pressure 120/80, pulse (!) 56, temperature 98 F (36.7 C), height 5\' 2"  (1.575 m), weight 168 lb 4.8 oz (76.3 kg). Alert and oriented. Skin warm and dry. Oral mucosa is moist.   . Sclera anicteric, conjunctivae is pink. Thyroid  not enlarged. No cervical lymphadenopathy. Lungs clear. Heart regular rate and rhythm.  Abdomen is soft. Bowel sounds are positive. No hepatomegaly. No abdominal masses felt. No tenderness.  No edema to lower extremities.           Assessment & Plan:  Hepatitis C.  Labs today PT/INR, urine drug screen. She will have her PCP sent [place] a list of her medications. She did not bring list in today so I was unable to verify her medications

## 2017-05-21 NOTE — Patient Instructions (Signed)
OV pending.  

## 2017-05-22 LAB — DRUG ABUSE PANEL 10-50, U
AMPHETAMINES (1000 NG/ML SCRN): NEGATIVE
BARBITURATES: NEGATIVE
BENZODIAZEPINES: NEGATIVE
COCAINE METABOLITES: NEGATIVE
MARIJUANA MET (50 ng/mL SCRN): NEGATIVE
METHADONE: NEGATIVE
METHAQUALONE: NEGATIVE
OPIATES: NEGATIVE
PHENCYCLIDINE: NEGATIVE
PROPOXYPHENE: NEGATIVE

## 2017-05-22 LAB — PROTIME-INR
INR: 1.1
Prothrombin Time: 11.2 s (ref 9.0–11.5)

## 2017-05-25 ENCOUNTER — Ambulatory Visit (HOSPITAL_COMMUNITY)
Admission: RE | Admit: 2017-05-25 | Discharge: 2017-05-25 | Disposition: A | Discharge status: Home / Self Care | Payer: Medicaid Other | Source: Ambulatory Visit | Attending: Internal Medicine | Admitting: Internal Medicine

## 2017-05-25 ENCOUNTER — Encounter (INDEPENDENT_AMBULATORY_CARE_PROVIDER_SITE_OTHER): Payer: Self-pay

## 2017-05-25 DIAGNOSIS — N261 Atrophy of kidney (terminal): Secondary | ICD-10-CM | POA: Insufficient documentation

## 2017-05-25 DIAGNOSIS — B1710 Acute hepatitis C without hepatic coma: Secondary | ICD-10-CM | POA: Insufficient documentation

## 2017-05-26 ENCOUNTER — Telehealth (INDEPENDENT_AMBULATORY_CARE_PROVIDER_SITE_OTHER): Payer: Self-pay | Admitting: Internal Medicine

## 2017-05-26 NOTE — Telephone Encounter (Signed)
   Colleen Nunez, will treat with  Mavyret x 8 weeks.

## 2017-05-27 NOTE — Telephone Encounter (Signed)
Per Karna Christmas do not do PA for medication until patient provided to [place] a list of all her current medications.

## 2017-05-28 ENCOUNTER — Telehealth (INDEPENDENT_AMBULATORY_CARE_PROVIDER_SITE_OTHER): Payer: Self-pay | Admitting: Internal Medicine

## 2017-05-28 NOTE — Telephone Encounter (Signed)
Talked with [name], She states that she called Dr.Fanta this week and ask them to send to Ms. Terri all of her medications. The ones that she is on and the ones she is not. She states that she is going to call Ms. Ida at Dr.Fanta's and have them send it to Ms Karna Christmas. I advised the patient that we could not move forward with the Hep C medication until we had it.

## 2017-05-28 NOTE — Telephone Encounter (Signed)
err

## 2017-06-09 ENCOUNTER — Telehealth (INDEPENDENT_AMBULATORY_CARE_PROVIDER_SITE_OTHER): Payer: Self-pay | Admitting: Internal Medicine

## 2017-06-09 NOTE — Telephone Encounter (Signed)
I talked with Colleen Nunez with the [employer].  [name] is aware that she cannot take the Colchicine and the [place]. She will stop the colchicine.

## 2017-06-17 ENCOUNTER — Telehealth (INDEPENDENT_AMBULATORY_CARE_PROVIDER_SITE_OTHER): Payer: Self-pay | Admitting: *Deleted

## 2017-06-17 NOTE — Telephone Encounter (Signed)
Patient was called and made aware that her medication was here to be picked up. She presented to the office to get her Medication, we went over the instructions and she was also advised that she is not to drink alcohol nor take the Colchicine. She says that she is going to start on 06/18/2017 @ 6 pm, I ask that she call and let is know so that we may order her lab work and office visit.

## 2017-06-25 ENCOUNTER — Emergency Department (HOSPITAL_COMMUNITY)
Admission: EM | Admit: 2017-06-25 | Discharge: 2017-06-25 | Disposition: A | Discharge status: Home / Self Care | Payer: Medicaid Other | Attending: Emergency Medicine | Admitting: Emergency Medicine

## 2017-06-25 ENCOUNTER — Encounter (HOSPITAL_COMMUNITY): Payer: Self-pay

## 2017-06-25 ENCOUNTER — Emergency Department (HOSPITAL_COMMUNITY): Payer: Medicaid Other

## 2017-06-25 DIAGNOSIS — M25552 Pain in left hip: Secondary | ICD-10-CM | POA: Diagnosis present

## 2017-06-25 DIAGNOSIS — Z79899 Other long term (current) drug therapy: Secondary | ICD-10-CM | POA: Insufficient documentation

## 2017-06-25 DIAGNOSIS — I129 Hypertensive chronic kidney disease with stage 1 through stage 4 chronic kidney disease, or unspecified chronic kidney disease: Secondary | ICD-10-CM | POA: Insufficient documentation

## 2017-06-25 DIAGNOSIS — M5442 Lumbago with sciatica, left side: Secondary | ICD-10-CM | POA: Insufficient documentation

## 2017-06-25 DIAGNOSIS — G8929 Other chronic pain: Secondary | ICD-10-CM

## 2017-06-25 DIAGNOSIS — N184 Chronic kidney disease, stage 4 (severe): Secondary | ICD-10-CM | POA: Diagnosis not present

## 2017-06-25 LAB — URINALYSIS, ROUTINE W REFLEX MICROSCOPIC
BACTERIA UA: NONE SEEN
BILIRUBIN URINE: NEGATIVE
Glucose, UA: NEGATIVE mg/dL
Hgb urine dipstick: NEGATIVE
KETONES UR: NEGATIVE mg/dL
Nitrite: NEGATIVE
PROTEIN: NEGATIVE mg/dL
Specific Gravity, Urine: 1.009 (ref 1.005–1.030)
pH: 5 (ref 5.0–8.0)

## 2017-06-25 MED ORDER — HYDROCODONE-ACETAMINOPHEN 5-325 MG PO TABS
1.0000 | ORAL_TABLET | Freq: Once | ORAL | Status: AC
  Administered 2017-06-25: 1 via ORAL
  Filled 2017-06-25: qty 1

## 2017-06-25 NOTE — ED Provider Notes (Signed)
[facility] DEPT Provider Note   CSN: [id number] Arrival date & time: 06/25/17  0805     History   Chief Complaint Chief Complaint  Patient presents with  . Hip Pain    HPI Colleen Nunez is a 61 y.o. female.  HPI   Colleen Nunez is a 61 y.o. female who presents to the Emergency Department complaining of left hip and low back pain.  Symptoms have been persistent for some time, but worse for one month.  She describes pain with standing, bending over and weight bearing.  She complains of sharp pain from her lower back that radiates into her left buttock, hip and thigh.  No recent injury.  She also complains of malodorous, dark urine for several days.  Denies burning, increased frequency, or bloody urine.  Has an appt to see nephrology, Dr. Lowanda Foster, on 07/15/17.  Has hx of CKD.  She denies fever, flank pain, vomiting, abd pain, numbness or weakness of the lower extremities, and bowel changes.   Past Medical History:  Diagnosis Date  . Anemia   . Anxiety   . Arthritis   . Chronic hip pain   . Chronic knee pain   . Depression    h/o suicide attempts in the past  . GERD (gastroesophageal reflux disease)   . Gout   . Hepatitis C antibody test positive       . Hypertension   . Polysubstance abuse    h/o  . Recurrent falls   . Renal insufficiency     Patient Active Problem List   Diagnosis Date Noted  . Hepatitis C 03/02/2015  . Spondylosis, cervical, with myelopathy 09/21/2013  . Quadriplegia ([place]) 09/21/2013  . Osteoarthritis of left knee 09/21/2013  . Hypokalemia 08/16/2013  . AKI (acute kidney injury) ([place]) 08/16/2013  . UTI (lower urinary tract infection) 08/07/2013  . Nausea & vomiting 08/07/2013  . Chronic kidney disease, stage IV (severe) ([place]) 08/07/2013  . Cervical myelopathy ([place]) 08/07/2013  . GERD 06/12/2009  . CHEST PAIN 06/12/2009  . LEG PAIN, BILATERAL 05/28/2009  . OBESITY 12/17/2007  . DYSPHAGIA UNSPECIFIED 12/03/2007  . ANEMIA, NORMOCYTIC  07/13/2007  . ALCOHOL ABUSE 07/13/2007  . RENAL DISEASE, CHRONIC, STAGE II 07/13/2007  . ANXIETY 06/29/2007  . DEPRESSION 06/29/2007  . Essential hypertension 06/29/2007  . CONSTIPATION 06/29/2007  . ARTHRITIS 06/29/2007  . MALAISE AND FATIGUE 06/29/2007    Past Surgical History:  Procedure Laterality Date  . ABDOMINAL HYSTERECTOMY    . ANTERIOR CERVICAL CORPECTOMY N/A 06/09/2013   Procedure: C4 C5 Corpectomy with C6-7 Anterior cervical fusion/Peek cage/Trestle plate;  Surgeon: Otilio Connors, MD;  Location: [facility];  Service: Neurosurgery;  Laterality: N/A;  Cervical Four, Cervical Five Corpectomy and Cervical six-seven Anterior cervical fusion/Peek cage Three-Five /Trestle Plating Cervical Three to Cervical Seven  . ANTERIOR CERVICAL DECOMP/DISCECTOMY FUSION N/A 08/12/2013   Procedure: Repair of Anterior  Cervical CSF LEAK, lumbar drain placement.;  Surgeon: Floyce Stakes, MD;  Location: [facility];  Service: Neurosurgery;  Laterality: N/A;  . bilateral SOO and appendectomy  2006  . cartilage removal     left knee  . COLONOSCOPY N/A 07/04/2015   Procedure: COLONOSCOPY;  Surgeon: Rogene Houston, MD;  [facility];  Service: Endoscopy;  Laterality: N/A;  100  . KNEE ARTHROSCOPY    . X-STOP IMPLANTATION      OB History    No data available       Home Medications  Prior to Admission medications   Medication Sig Start Date End Date Taking? Authorizing Provider  amLODipine (NORVASC) 10 MG tablet Take 0.5 tablets (5 mg total) by mouth daily. 09/02/13   Love, Ivan Anchors, PA-C  colchicine 0.6 MG tablet Take 1 tablet (0.6 mg total) by mouth 2 (two) times daily. 09/06/13   Meredith Staggers, MD  dexamethasone (DECADRON) 4 MG tablet Take 1 tablet (4 mg total) by mouth 2 (two) times daily with a meal. 12/22/15   Lily Kocher, PA-C  diclofenac sodium (VOLTAREN) 1 % GEL Apply 2 g topically 4 (four) times daily. To Left knee for arthritis. 09/02/13   Love, Ivan Anchors, PA-C    diphenhydrAMINE (BENADRYL) 25 MG tablet Take 25 mg by mouth every 6 (six) hours as needed.    [provider]  ferrous sulfate 325 (65 FE) MG tablet Take 325 mg by mouth daily with breakfast.    [provider]  HYDROcodone-acetaminophen (NORCO/VICODIN) 5-325 MG tablet Take 1 tablet by mouth every 6 (six) hours as needed for moderate pain (Must last 14 days.Do not take and drive a car or use machinery.). 12/11/16   Sanjuana Kava, MD  methocarbamol (ROBAXIN) 500 MG tablet Take 1 tablet (500 mg total) by mouth 3 (three) times daily. 12/22/15   Lily Kocher, PA-C  predniSONE (DELTASONE) 20 MG tablet Take 2 tablets (40 mg total) by mouth daily. 10/11/16   Francine Graven, DO    Family History Family History  Problem Relation Age of Onset  . Diabetes Sister   . Diabetes Mother        deceased age 38  . Kidney failure Mother   . Alcohol abuse Father   . Colon cancer Neg Hx     Social History Social History  Substance Use Topics  . Smoking status: Never Smoker  . Smokeless tobacco: Never Used  . Alcohol use 1.8 oz/week    3 Cans of beer per week     Comment: occasionally     Allergies   Aspirin and Oxycodone   Review of Systems Review of Systems  Constitutional: Negative for fever.  Respiratory: Negative for chest tightness and shortness of breath.   Cardiovascular: Negative for chest pain.  Gastrointestinal: Negative for abdominal pain, constipation and vomiting.  Genitourinary: Positive for dysuria. Negative for decreased urine volume, difficulty urinating, flank pain and hematuria.  Musculoskeletal: Positive for back pain (lower back and left hip pain). Negative for joint swelling and neck pain.  Skin: Negative for rash.  Neurological: Negative for dizziness, weakness and numbness.  All other systems reviewed and are negative.    Physical Exam Updated Vital Signs BP 138/73 (BP Location: Left Arm)   Pulse 89   Temp 97.9 F (36.6 C) (Oral)   Resp  18   Ht 5\' 2"  (1.575 m)   Wt 77.1 kg (170 lb)   SpO2 99%   BMI 31.09 kg/m   Physical Exam  Constitutional: She is oriented to person, place, and time. She appears well-developed and well-nourished. No distress.  HENT:  Head: Normocephalic and atraumatic.  Mouth/Throat: Oropharynx is clear and moist.  Neck: Normal range of motion. Neck supple.  Cardiovascular: Normal rate, regular rhythm, normal heart sounds and intact distal pulses.   Pulmonary/Chest: Effort normal and breath sounds normal. No respiratory distress.  Abdominal: Soft. She exhibits no distension. There is no tenderness. There is no CVA tenderness.  Musculoskeletal: She exhibits tenderness. She exhibits no edema.       Lumbar  back: She exhibits tenderness and pain. She exhibits normal range of motion, no swelling, no deformity, no laceration and normal pulse.  ttp of the lower midline lumbar spine and left lumbar paraspinal muscles and SI joint.  Distal sensation intact.  Pt has 5/5 strength against resistance of bilateral lower extremities.     Neurological: She is alert and oriented to person, place, and time. She has normal strength. No sensory deficit. She exhibits normal muscle tone. Coordination and gait normal.  Reflex Scores:      Patellar reflexes are 2+ on the right side and 2+ on the left side.      Achilles reflexes are 2+ on the right side and 2+ on the left side. Skin: Skin is warm and dry. Capillary refill takes less than 2 seconds. No rash noted.  Nursing note and vitals reviewed.    ED Treatments / Results  Labs (all labs ordered are listed, but only abnormal results are displayed) Labs Reviewed  URINALYSIS, ROUTINE W REFLEX MICROSCOPIC - Abnormal; Notable for the following:       Result Value   Leukocytes, UA SMALL (*)    Squamous Epithelial / LPF 0-5 (*)    All other components within normal limits  URINE CULTURE    EKG  EKG Interpretation None       Radiology Dg Lumbar Spine  Complete  Result Date: 06/25/2017 CLINICAL DATA:  Chronic lumbago EXAM: LUMBAR SPINE - COMPLETE 4+ VIEW COMPARISON:  April 28, 2007 lumbar radiographs and lumbar spine MRI FINDINGS: Frontal, lateral, spot lumbosacral lateral, and bilateral oblique views were obtained. There are 5 non-rib-bearing lumbar type vertebral bodies. There is no fracture or spondylolisthesis. There is mild disc space narrowing at L3-4, L4-5 and L5-S1. There is calcification in the anterior longitudinal ligament at L4-5. There is facet osteoarthritic change at L3-4, L4-5, and L5-S1 bilaterally. IMPRESSION: Osteoarthritic change at several levels. No fracture or spondylolisthesis. Appearance similar to prior study. Electronically Signed   By: Lowella Grip III M.D.   On: 06/25/2017 09:26   Dg Hip Unilat W Or Wo Pelvis 2-3 Views Left  Result Date: 06/25/2017 CLINICAL DATA:  Pain for 1 month EXAM: DG HIP (WITH OR WITHOUT PELVIS) 2-3V LEFT COMPARISON:  None. FINDINGS: Frontal pelvis as well as frontal and lateral left hip images were obtained. There is no acute fracture or dislocation. There is moderate symmetric narrowing of both hip joints. There is bony overgrowth along each greater trochanter with calcification superior to each greater trochanter, likely calcification in tendons or ligaments. There is mild osteoarthritic change in each sacroiliac joint. IMPRESSION: Moderate symmetric narrowing of both hip joints. No fracture or dislocation. Mild osteoarthritic change in each sacroiliac joint. Electronically Signed   By: Lowella Grip III M.D.   On: 06/25/2017 09:24    Procedures Procedures (including critical care time)  Medications Ordered in ED Medications  HYDROcodone-acetaminophen (NORCO/VICODIN) 5-325 MG per tablet 1 tablet (not administered)     Initial Impression / Assessment and Plan / ED Course  I have reviewed the triage vital signs and the nursing notes.  Pertinent labs & imaging results that were  available during my care of the patient were reviewed by me and considered in my medical decision making (see chart for details).     Pt ambulates with a cane at baseline.  No focal neuro deficits.  Gait steady.    Reviewed on the [place], receives monthly prescriptions for hydrocodone 5/325 mg  No concerning sx's for septic  joint.  Since pt has CKD, I will refrain from NSAID's, pt was prescribed pain medication from pain management yesterday.  Prefers to f/u with Dr. Luna Glasgow.  Appears stable for d/c  Final Clinical Impressions(s) / ED Diagnoses   Final diagnoses:  Left hip pain  Chronic midline low back pain with left-sided sciatica    New Prescriptions New Prescriptions   No medications on file     Kem Parkinson, PA-C 06/25/17 1000    Mesner, Corene Cornea, MD 06/25/17 1032

## 2017-06-25 NOTE — Discharge Instructions (Signed)
You can try apply warm heat to your back and hip.  Follow-up with your primary doctor or Dr. Luna Glasgow

## 2017-06-25 NOTE — ED Triage Notes (Signed)
Pt c/o pain in lower back and left hip x 1 month.  Reports urine has been very concentrated.   Denies injury.

## 2017-06-27 LAB — URINE CULTURE

## 2017-06-28 ENCOUNTER — Telehealth: Payer: Self-pay

## 2017-06-28 NOTE — Telephone Encounter (Signed)
No further treatment for UC ED 06/25/17 per Arlean Hopping PA

## 2017-06-28 NOTE — Progress Notes (Signed)
ED Antimicrobial Stewardship Positive Culture Follow Up   Colleen Nunez is an 61 y.o. female who presented to [facility] on 06/25/2017 with a chief complaint of  Chief Complaint  Patient presents with  . Hip Pain    Recent Results (from the past 720 hour(s))  Urine culture     Status: Abnormal   Collection Time: 06/25/17  8:24 AM  Result Value Ref Range Status   Specimen Description URINE, CLEAN CATCH  Final   Special Requests NONE  Final   Culture 20,000 COLONIES/mL ESCHERICHIA COLI (A)  Final   Report Status 06/27/2017 FINAL  Final   Organism ID, Bacteria ESCHERICHIA COLI (A)  Final      Susceptibility   Escherichia coli - MIC*    AMPICILLIN >=32 RESISTANT Resistant     CEFAZOLIN <=4 SENSITIVE Sensitive     CEFTRIAXONE <=1 SENSITIVE Sensitive     CIPROFLOXACIN <=0.25 SENSITIVE Sensitive     GENTAMICIN <=1 SENSITIVE Sensitive     IMIPENEM <=0.25 SENSITIVE Sensitive     NITROFURANTOIN <=16 SENSITIVE Sensitive     TRIMETH/SULFA <=20 SENSITIVE Sensitive     AMPICILLIN/SULBACTAM 16 INTERMEDIATE Intermediate     PIP/TAZO <=4 SENSITIVE Sensitive     Extended ESBL NEGATIVE Sensitive     * 20,000 COLONIES/mL ESCHERICHIA COLI    No urinary symptoms, UCx with only 20k colonies of Ecoli.  New antibiotic prescription: No further treatment necessary.  ED Provider: Arlean Hopping, PA   Elicia Lamp, PharmD, BCPS Clinical Pharmacist 06/28/2017 9:58 AM

## 2017-07-01 ENCOUNTER — Ambulatory Visit (INDEPENDENT_AMBULATORY_CARE_PROVIDER_SITE_OTHER): Payer: Medicaid Other | Admitting: Orthopaedic Surgery

## 2017-07-01 ENCOUNTER — Encounter: Payer: Self-pay | Admitting: Orthopaedic Surgery

## 2017-07-01 VITALS — BP 132/81 | HR 90 | Temp 97.9°F | Ht 62.0 in | Wt 172.0 lb

## 2017-07-01 DIAGNOSIS — M1612 Unilateral primary osteoarthritis, left hip: Secondary | ICD-10-CM | POA: Diagnosis not present

## 2017-07-01 MED ORDER — NAPROXEN 500 MG PO TABS: 500.0000 mg | ORAL_TABLET | Freq: Two times a day (BID) | ORAL | 5 refills | Status: DC

## 2017-07-01 NOTE — Progress Notes (Signed)
Patient Colleen Nunez [name], female DOB:1956/01/19, 61 y.o. XAJ:[id number]  Chief Complaint  Patient presents with  . Follow-up    hip pain    HPI  Colleen Nunez is a 61 y.o. female who has left hip pain.  She went to the ER on 06-25-17.  I have reviewed the notes and the X-rays and xray reports.  She has no trauma.  Her pain is with motion and activity.  She has no numbness.  She is not taking any NSAID. HPI  Body mass index is 31.46 kg/m.  ROS  Review of Systems  Constitutional:       Patient does not have Diabetes Mellitus. Patient has hypertension. Patient has COPD or shortness of breath. Patient does not have BMI > 35. Patient does not have current smoking history.  HENT: Negative for congestion.   Respiratory: Negative for cough and shortness of breath.   Cardiovascular: Negative for chest pain.  Gastrointestinal:       GERD   Endocrine: Positive for cold intolerance.  Musculoskeletal: Positive for arthralgias, gait problem, joint swelling and myalgias.  Allergic/Immunologic: Negative for environmental allergies.  All other systems reviewed and are negative.   Past Medical History:  Diagnosis Date  . Anemia   . Anxiety   . Arthritis   . Chronic hip pain   . Chronic knee pain   . Depression    h/o suicide attempts in the past  . GERD (gastroesophageal reflux disease)   . Gout   . Hepatitis C antibody test positive       . Hypertension   . Polysubstance abuse    h/o  . Recurrent falls   . Renal insufficiency     Past Surgical History:  Procedure Laterality Date  . ABDOMINAL HYSTERECTOMY    . ANTERIOR CERVICAL CORPECTOMY N/A 06/09/2013   Procedure: C4 C5 Corpectomy with C6-7 Anterior cervical fusion/Peek cage/Trestle plate;  Surgeon: Otilio Connors, MD;  Location: [facility];  Service: Neurosurgery;  Laterality: N/A;  Cervical Four, Cervical Five Corpectomy and Cervical six-seven Anterior cervical fusion/Peek cage Three-Five /Trestle Plating Cervical  Three to Cervical Seven  . ANTERIOR CERVICAL DECOMP/DISCECTOMY FUSION N/A 08/12/2013   Procedure: Repair of Anterior  Cervical CSF LEAK, lumbar drain placement.;  Surgeon: Floyce Stakes, MD;  Location: [facility];  Service: Neurosurgery;  Laterality: N/A;  . bilateral SOO and appendectomy  2006  . cartilage removal     left knee  . COLONOSCOPY N/A 07/04/2015   Procedure: COLONOSCOPY;  Surgeon: Rogene Houston, MD;  [facility];  Service: Endoscopy;  Laterality: N/A;  100  . KNEE ARTHROSCOPY    . X-STOP IMPLANTATION      Family History  Problem Relation Age of Onset  . Diabetes Sister   . Diabetes Mother        deceased age 75  . Kidney failure Mother   . Alcohol abuse Father   . Colon cancer Neg Hx     Social History Social History  Substance Use Topics  . Smoking status: Never Smoker  . Smokeless tobacco: Never Used  . Alcohol use 1.8 oz/week    3 Cans of beer per week     Comment: occasionally    Allergies  Allergen Reactions  . Aspirin     REACTION: Hx of overdose on this years ago - too scared to take. Tried to committ suicide.  . Oxycodone Nausea And Vomiting    Current Outpatient Prescriptions  Medication Sig  Dispense Refill  . amLODipine (NORVASC) 10 MG tablet Take 0.5 tablets (5 mg total) by mouth daily.    Marland Kitchen amoxicillin (AMOXIL) 500 MG capsule Take 500 mg by mouth 3 (three) times daily.    . colchicine 0.6 MG tablet Take 1 tablet (0.6 mg total) by mouth 2 (two) times daily. (Patient not taking: Reported on 06/25/2017) 60 tablet 0  . dexamethasone (DECADRON) 4 MG tablet Take 1 tablet (4 mg total) by mouth 2 (two) times daily with a meal. 12 tablet 0  . diclofenac sodium (VOLTAREN) 1 % GEL Apply 2 g topically 4 (four) times daily. To Left knee for arthritis. 4 Tube 1  . diphenhydrAMINE (BENADRYL) 25 MG tablet Take 25 mg by mouth every 6 (six) hours as needed.    . ferrous sulfate 325 (65 FE) MG tablet Take 325 mg by mouth daily with breakfast.    .  methocarbamol (ROBAXIN) 500 MG tablet Take 1 tablet (500 mg total) by mouth 3 (three) times daily. 21 tablet 0  . naproxen (NAPROSYN) 500 MG tablet Take 1 tablet (500 mg total) by mouth 2 (two) times daily with a meal. 60 tablet 5   No current facility-administered medications for this visit.      Physical Exam  Blood pressure 132/81, pulse 90, temperature 97.9 F (36.6 C), height 5\' 2"  (1.575 m), weight 172 lb (78 kg).  Constitutional: overall normal hygiene, normal nutrition, well developed, normal grooming, normal body habitus. Assistive device:none  Musculoskeletal: gait and station Limp left, muscle tone and strength are normal, no tremors or atrophy is present.  .  Neurological: coordination overall normal.  Deep tendon reflex/nerve stretch intact.  Sensation normal.  Cranial nerves II-XII intact.   Skin:   Normal overall no scars, lesions, ulcers or rashes. No psoriasis.  Psychiatric: Alert and oriented x 3.  Recent memory intact, remote memory unclear.  Normal mood and affect. Well groomed.  Good eye contact.  Cardiovascular: overall no swelling, no varicosities, no edema bilaterally, normal temperatures of the legs and arms, no clubbing, cyanosis and good capillary refill.  Lymphatic: palpation is normal.  Left hip has pain, internal rotation 20, external 15, flexion 90, abduction and adduction tender but nearly full, extension 5.  Limp to the left.  NV intact.  I pared her toenails while she was here.  The patient has been educated about the nature of the problem(s) and counseled on treatment options.  The patient appeared to understand what I have discussed and is in agreement with it.  Encounter Diagnosis  Name Primary?  . Primary osteoarthritis of left hip Yes    PLAN Call if any problems.  Precautions discussed.  Continue current medications.   Return to clinic 1 week   I called in prednisone dose pack.  Electronically Signed Sanjuana Kava,  MD 8/15/201810:35 AM

## 2017-07-02 ENCOUNTER — Telehealth (INDEPENDENT_AMBULATORY_CARE_PROVIDER_SITE_OTHER): Payer: Self-pay | Admitting: *Deleted

## 2017-07-02 NOTE — Telephone Encounter (Signed)
Call and have refill on your Hepatitis C medication.

## 2017-07-02 NOTE — Telephone Encounter (Signed)
Call patient -- she has questions about Hep C meds  [phone]

## 2017-07-07 ENCOUNTER — Telehealth (INDEPENDENT_AMBULATORY_CARE_PROVIDER_SITE_OTHER): Payer: Self-pay | Admitting: Internal Medicine

## 2017-07-07 NOTE — Telephone Encounter (Signed)
Message left on answering machine that her 2nd weeks of Hep C medication is at our office.    Hope, please call patient and let her know her medication is here.

## 2017-07-08 ENCOUNTER — Ambulatory Visit (INDEPENDENT_AMBULATORY_CARE_PROVIDER_SITE_OTHER): Payer: Medicaid Other | Admitting: Orthopaedic Surgery

## 2017-07-08 VITALS — BP 107/72 | HR 80 | Temp 97.2°F | Ht 62.0 in | Wt 171.0 lb

## 2017-07-08 DIAGNOSIS — M1612 Unilateral primary osteoarthritis, left hip: Secondary | ICD-10-CM

## 2017-07-08 DIAGNOSIS — M7062 Trochanteric bursitis, left hip: Secondary | ICD-10-CM

## 2017-07-08 NOTE — Progress Notes (Signed)
Patient Colleen Nunez, female DOB:1956-06-28, 61 y.o. GSU:[id number]  Chief Complaint  Patient presents with  . Follow-up    Left hip pain    HPI  Colleen Nunez is a 61 y.o. female who has left hip pain and trochanteric bursitis.  She is much improved after the prednisone dose pack.  She is walking better using her cane.  She has no new trauma, no redness. HPI  Body mass index is 31.28 kg/m.  ROS  Review of Systems  Constitutional:       Patient does not have Diabetes Mellitus. Patient has hypertension. Patient has COPD or shortness of breath. Patient does not have BMI > 35. Patient does not have current smoking history.  HENT: Negative for congestion.   Respiratory: Negative for cough and shortness of breath.   Cardiovascular: Negative for chest pain.  Gastrointestinal:       GERD   Endocrine: Positive for cold intolerance.  Musculoskeletal: Positive for arthralgias, gait problem, joint swelling and myalgias.  Allergic/Immunologic: Negative for environmental allergies.  All other systems reviewed and are negative.   Past Medical History:  Diagnosis Date  . Anemia   . Anxiety   . Arthritis   . Chronic hip pain   . Chronic knee pain   . Depression    h/o suicide attempts in the past  . GERD (gastroesophageal reflux disease)   . Gout   . Hepatitis C antibody test positive       . Hypertension   . Polysubstance abuse    h/o  . Recurrent falls   . Renal insufficiency     Past Surgical History:  Procedure Laterality Date  . ABDOMINAL HYSTERECTOMY    . ANTERIOR CERVICAL CORPECTOMY N/A 06/09/2013   Procedure: C4 C5 Corpectomy with C6-7 Anterior cervical fusion/Peek cage/Trestle plate;  Surgeon: Otilio Connors, MD;  Location: [place] [facility];  Service: Neurosurgery;  Laterality: N/A;  Cervical Four, Cervical Five Corpectomy and Cervical six-seven Anterior cervical fusion/Peek cage Three-Five /Trestle Plating Cervical Three to Cervical Seven  . ANTERIOR CERVICAL  DECOMP/DISCECTOMY FUSION N/A 08/12/2013   Procedure: Repair of Anterior  Cervical CSF LEAK, lumbar drain placement.;  Surgeon: Floyce Stakes, MD;  Location: [facility];  Service: Neurosurgery;  Laterality: N/A;  . bilateral SOO and appendectomy  2006  . cartilage removal     left knee  . COLONOSCOPY N/A 07/04/2015   Procedure: COLONOSCOPY;  Surgeon: Rogene Houston, MD;  [facility];  Service: Endoscopy;  Laterality: N/A;  100  . KNEE ARTHROSCOPY    . X-STOP IMPLANTATION      Family History  Problem Relation Age of Onset  . Diabetes Sister   . Diabetes Mother        deceased age 83  . Kidney failure Mother   . Alcohol abuse Father   . Colon cancer Neg Hx     Social History Social History  Substance Use Topics  . Smoking status: Never Smoker  . Smokeless tobacco: Never Used  . Alcohol use 1.8 oz/week    3 Cans of beer per week     Comment: occasionally    Allergies  Allergen Reactions  . Aspirin     REACTION: Hx of overdose on this years ago - too scared to take. Tried to committ suicide.  . Oxycodone Nausea And Vomiting    Current Outpatient Prescriptions  Medication Sig Dispense Refill  . amLODipine (NORVASC) 10 MG tablet Take 0.5 tablets (5 mg total)  by mouth daily.    Marland Kitchen amoxicillin (AMOXIL) 500 MG capsule Take 500 mg by mouth 3 (three) times daily.    . colchicine 0.6 MG tablet Take 1 tablet (0.6 mg total) by mouth 2 (two) times daily. (Patient not taking: Reported on 06/25/2017) 60 tablet 0  . dexamethasone (DECADRON) 4 MG tablet Take 1 tablet (4 mg total) by mouth 2 (two) times daily with a meal. 12 tablet 0  . diclofenac sodium (VOLTAREN) 1 % GEL Apply 2 g topically 4 (four) times daily. To Left knee for arthritis. 4 Tube 1  . diphenhydrAMINE (BENADRYL) 25 MG tablet Take 25 mg by mouth every 6 (six) hours as needed.    . ferrous sulfate 325 (65 FE) MG tablet Take 325 mg by mouth daily with breakfast.    . methocarbamol (ROBAXIN) 500 MG tablet Take 1 tablet  (500 mg total) by mouth 3 (three) times daily. 21 tablet 0  . naproxen (NAPROSYN) 500 MG tablet Take 1 tablet (500 mg total) by mouth 2 (two) times daily with a meal. 60 tablet 5   No current facility-administered medications for this visit.      Physical Exam  Blood pressure 107/72, pulse 80, temperature (!) 97.2 F (36.2 C), height 5\' 2"  (1.575 m), weight 171 lb (77.6 kg).  Constitutional: overall normal hygiene, normal nutrition, well developed, normal grooming, normal body habitus. Assistive device:cane  Musculoskeletal: gait and station Limp left, muscle tone and strength are normal, no tremors or atrophy is present.  .  Neurological: coordination overall normal.  Deep tendon reflex/nerve stretch intact.  Sensation normal.  Cranial nerves II-XII intact.   Skin:   Normal overall no scars, lesions, ulcers or rashes. No psoriasis.  Psychiatric: Alert and oriented x 3.  Recent memory intact, remote memory unclear.  Normal mood and affect. Well groomed.  Good eye contact.  Cardiovascular: overall no swelling, no varicosities, no edema bilaterally, normal temperatures of the legs and arms, no clubbing, cyanosis and good capillary refill.  Lymphatic: palpation is normal.  Left hip trochanteric area has no redness or swelling and is not painful today.  Hip motion is decreased but not painful.  She has limp to the left.  NV intact.  The patient has been educated about the nature of the problem(s) and counseled on treatment options.  The patient appeared to understand what I have discussed and is in agreement with it.  Encounter Diagnoses  Name Primary?  . Trochanteric bursitis of left hip Yes  . Primary osteoarthritis of left hip     PLAN Call if any problems.  Precautions discussed.  Continue current medications.   Return to clinic 1 month   Electronically Signed Sanjuana Kava, MD 8/22/20189:54 AM

## 2017-07-10 NOTE — Telephone Encounter (Signed)
Called and left the patient a message on her voicemail.

## 2017-07-13 ENCOUNTER — Telehealth (INDEPENDENT_AMBULATORY_CARE_PROVIDER_SITE_OTHER): Payer: Self-pay | Admitting: Internal Medicine

## 2017-07-13 NOTE — Telephone Encounter (Signed)
Patient called after hours on 07/10/17.  She thanked [place] for calling her about her medication.  She is a little sick. She will try to pick it up Monday, 07/13/17 when she gets a ride.  She'll pick it up when she can.

## 2017-07-13 NOTE — Telephone Encounter (Signed)
noted 

## 2017-08-05 ENCOUNTER — Telehealth (INDEPENDENT_AMBULATORY_CARE_PROVIDER_SITE_OTHER): Payer: Self-pay | Admitting: Internal Medicine

## 2017-08-05 NOTE — Telephone Encounter (Signed)
err

## 2017-08-05 NOTE — Telephone Encounter (Signed)
She will have her labs drawn tomorrow

## 2017-08-11 ENCOUNTER — Ambulatory Visit: Payer: Medicaid Other | Admitting: Orthopaedic Surgery

## 2017-08-11 ENCOUNTER — Ambulatory Visit (INDEPENDENT_AMBULATORY_CARE_PROVIDER_SITE_OTHER): Payer: Medicaid Other | Admitting: Orthopaedic Surgery

## 2017-08-11 ENCOUNTER — Encounter: Payer: Self-pay | Admitting: Orthopaedic Surgery

## 2017-08-11 VITALS — BP 141/89 | HR 83 | Temp 98.0°F | Ht 62.0 in | Wt 161.0 lb

## 2017-08-11 DIAGNOSIS — M7062 Trochanteric bursitis, left hip: Secondary | ICD-10-CM

## 2017-08-11 NOTE — Progress Notes (Signed)
PROCEDURE NOTE:  The patient request injection, verbal consent was obtained.  The left trochanteric area of the hip was prepped appropriately after time out was performed.   Sterile technique was observed and injection of 1 cc of Depo-Medrol 40 mg with several cc's of plain xylocaine. Anesthesia was provided by ethyl chloride and a 20-gauge needle was used to inject the hip area. The injection was tolerated well.  A band aid dressing was applied.  The patient was advised to apply ice later today and tomorrow to the injection sight as needed.  Encounter Diagnosis  Name Primary?  . Trochanteric bursitis of left hip Yes   Return in one month.  Call if any problem.  Precautions discussed.    Electronically Signed Sanjuana Kava, MD 9/25/20182:38 PM

## 2017-08-19 ENCOUNTER — Other Ambulatory Visit (INDEPENDENT_AMBULATORY_CARE_PROVIDER_SITE_OTHER): Payer: Self-pay | Admitting: *Deleted

## 2017-08-19 DIAGNOSIS — B182 Chronic viral hepatitis C: Secondary | ICD-10-CM

## 2017-08-25 ENCOUNTER — Telehealth (INDEPENDENT_AMBULATORY_CARE_PROVIDER_SITE_OTHER): Payer: Self-pay | Admitting: Internal Medicine

## 2017-08-26 NOTE — Telephone Encounter (Signed)
err

## 2017-08-28 LAB — CBC WITH DIFFERENTIAL/PLATELET
BASOS PCT: 0.4 %
Basophils Absolute: 30 cells/uL (ref 0–200)
EOS PCT: 1.9 %
Eosinophils Absolute: 143 cells/uL (ref 15–500)
HCT: 30.5 % — ABNORMAL LOW (ref 35.0–45.0)
HEMOGLOBIN: 10 g/dL — AB (ref 11.7–15.5)
Lymphs Abs: 3165 cells/uL (ref 850–3900)
MCH: 27.5 pg (ref 27.0–33.0)
MCHC: 32.8 g/dL (ref 32.0–36.0)
MCV: 83.8 fL (ref 80.0–100.0)
MONOS PCT: 6.8 %
MPV: 11 fL (ref 7.5–12.5)
NEUTROS ABS: 3653 {cells}/uL (ref 1500–7800)
Neutrophils Relative %: 48.7 %
Platelets: 166 10*3/uL (ref 140–400)
RBC: 3.64 10*6/uL — AB (ref 3.80–5.10)
RDW: 12.9 % (ref 11.0–15.0)
Total Lymphocyte: 42.2 %
WBC mixed population: 510 cells/uL (ref 200–950)
WBC: 7.5 10*3/uL (ref 3.8–10.8)

## 2017-08-28 LAB — HEPATIC FUNCTION PANEL
AG RATIO: 1.2 (calc) (ref 1.0–2.5)
ALBUMIN MSPROF: 3.7 g/dL (ref 3.6–5.1)
ALKALINE PHOSPHATASE (APISO): 105 U/L (ref 33–130)
ALT: 9 U/L (ref 6–29)
AST: 15 U/L (ref 10–35)
Bilirubin, Direct: 0.2 mg/dL (ref 0.0–0.2)
GLOBULIN: 3.2 g/dL (ref 1.9–3.7)
Indirect Bilirubin: 0.4 mg/dL (calc) (ref 0.2–1.2)
TOTAL PROTEIN: 6.9 g/dL (ref 6.1–8.1)
Total Bilirubin: 0.6 mg/dL (ref 0.2–1.2)

## 2017-08-28 LAB — HEPATITIS C RNA QUANTITATIVE
HCV QUANT LOG: NOT DETECTED {Log_IU}/mL
HCV RNA, PCR, QN: <15 IU/mL

## 2017-09-03 ENCOUNTER — Other Ambulatory Visit (INDEPENDENT_AMBULATORY_CARE_PROVIDER_SITE_OTHER): Payer: Self-pay | Admitting: *Deleted

## 2017-09-03 DIAGNOSIS — B182 Chronic viral hepatitis C: Secondary | ICD-10-CM

## 2017-09-08 ENCOUNTER — Encounter: Payer: Self-pay | Admitting: Orthopaedic Surgery

## 2017-09-08 ENCOUNTER — Ambulatory Visit (INDEPENDENT_AMBULATORY_CARE_PROVIDER_SITE_OTHER): Payer: Medicaid Other | Admitting: Orthopaedic Surgery

## 2017-09-08 VITALS — BP 138/97 | HR 76 | Temp 96.8°F | Ht 62.0 in | Wt 168.0 lb

## 2017-09-08 DIAGNOSIS — G8929 Other chronic pain: Secondary | ICD-10-CM

## 2017-09-08 DIAGNOSIS — M7062 Trochanteric bursitis, left hip: Secondary | ICD-10-CM

## 2017-09-08 DIAGNOSIS — M25561 Pain in right knee: Secondary | ICD-10-CM

## 2017-09-08 NOTE — Progress Notes (Signed)
PROCEDURE NOTE:  The patient request injection, verbal consent was obtained.  The left trochanteric area of the hip was prepped appropriately after time out was performed.   Sterile technique was observed and injection of 1 cc of Depo-Medrol 40 mg with several cc's of plain xylocaine. Anesthesia was provided by ethyl chloride and a 20-gauge needle was used to inject the hip area. The injection was tolerated well.  A band aid dressing was applied.  The patient was advised to apply ice later today and tomorrow to the injection sight as needed.  Encounter Diagnoses  Name Primary?  . Trochanteric bursitis of left hip Yes  . Chronic pain of right knee    Return in one month. Rx given for right hinged knee brace.  Call if any problem.  Precautions discussed.   Electronically Signed Sanjuana Kava, MD 10/23/20189:36 AM

## 2017-09-10 ENCOUNTER — Other Ambulatory Visit (INDEPENDENT_AMBULATORY_CARE_PROVIDER_SITE_OTHER): Payer: Self-pay | Admitting: *Deleted

## 2017-09-10 ENCOUNTER — Encounter (INDEPENDENT_AMBULATORY_CARE_PROVIDER_SITE_OTHER): Payer: Self-pay | Admitting: *Deleted

## 2017-09-10 DIAGNOSIS — B182 Chronic viral hepatitis C: Secondary | ICD-10-CM

## 2017-10-06 ENCOUNTER — Encounter: Payer: Self-pay | Admitting: Orthopaedic Surgery

## 2017-10-06 ENCOUNTER — Ambulatory Visit: Payer: Medicaid Other | Admitting: Orthopaedic Surgery

## 2017-10-06 VITALS — BP 182/100 | HR 65 | Temp 97.3°F | Ht 62.0 in | Wt 170.0 lb

## 2017-10-06 DIAGNOSIS — M7062 Trochanteric bursitis, left hip: Secondary | ICD-10-CM | POA: Diagnosis not present

## 2017-10-06 NOTE — Progress Notes (Signed)
PROCEDURE NOTE:  The patient request injection, verbal consent was obtained.  The left trochanteric area of the hip was prepped appropriately after time out was performed.   Sterile technique was observed and injection of 1 cc of Depo-Medrol 40 mg with several cc's of plain xylocaine. Anesthesia was provided by ethyl chloride and a 20-gauge needle was used to inject the hip area. The injection was tolerated well.  A band aid dressing was applied.  The patient was advised to apply ice later today and tomorrow to the injection sight as needed.  Return in one month.  Call if any problem.  Precautions discussed.   Electronically [place], MD 11/20/201810:54 AM

## 2017-10-10 IMAGING — US US ABDOMEN COMPLETE
1 series · 14 of 25 positions shown · non-contrast
Comparison: 07/23/2016

CLINICAL DATA: Hepatitis-C.  Chronic renal insufficiency.

EXAM:
ABDOMEN ULTRASOUND COMPLETE

[Series 1: us abdomen complete · 0.21mm/px · 14 of 95 slices shown]
[im 1/95]
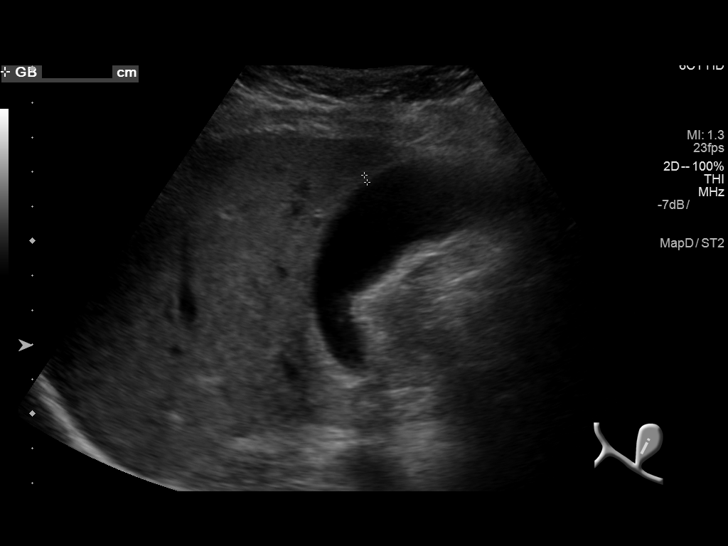
[im 8/95]
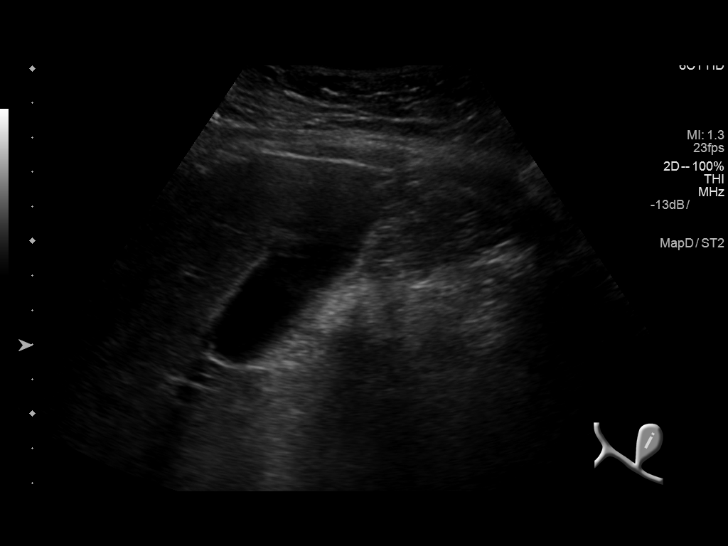
[im 16/95]
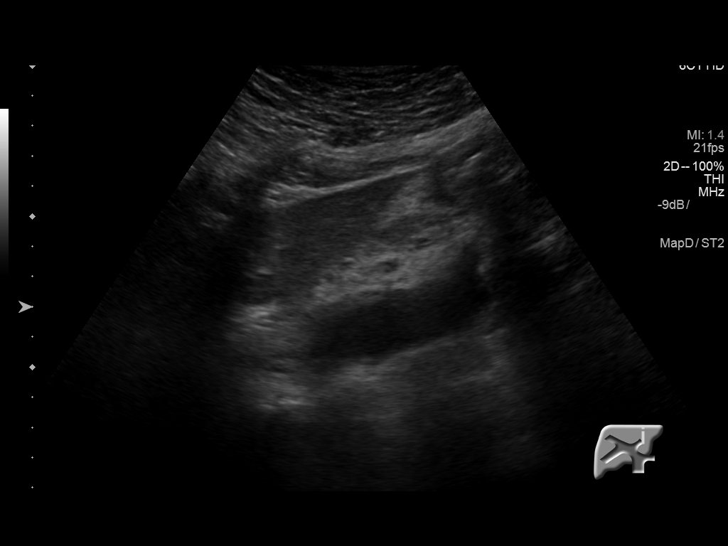
[im 24/95]
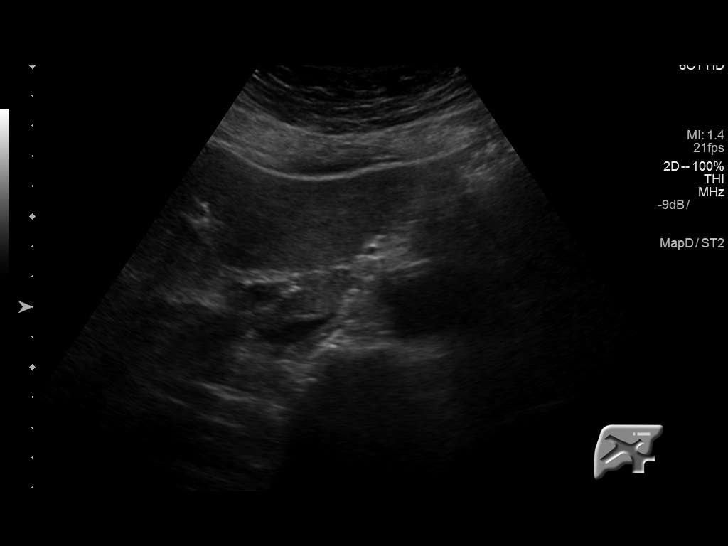
[im 32/95]
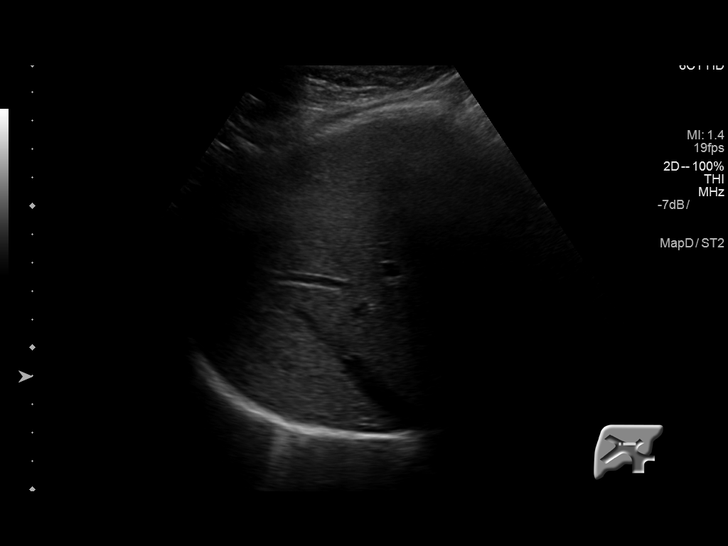
[im 36/95]
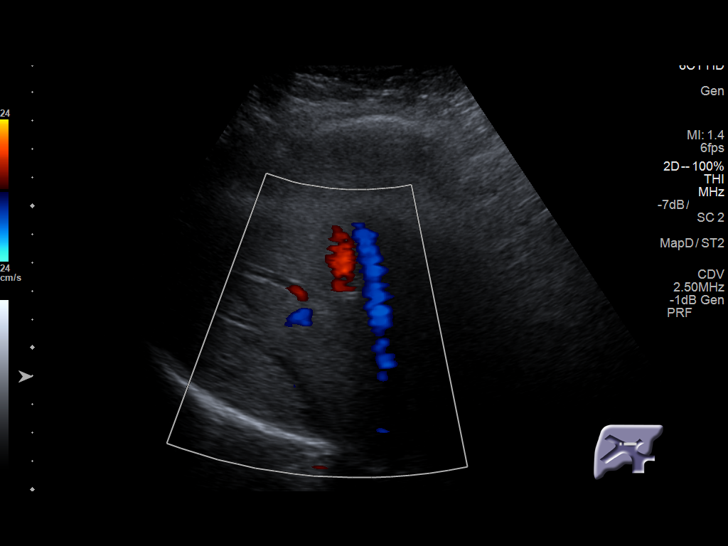
[im 44/95]
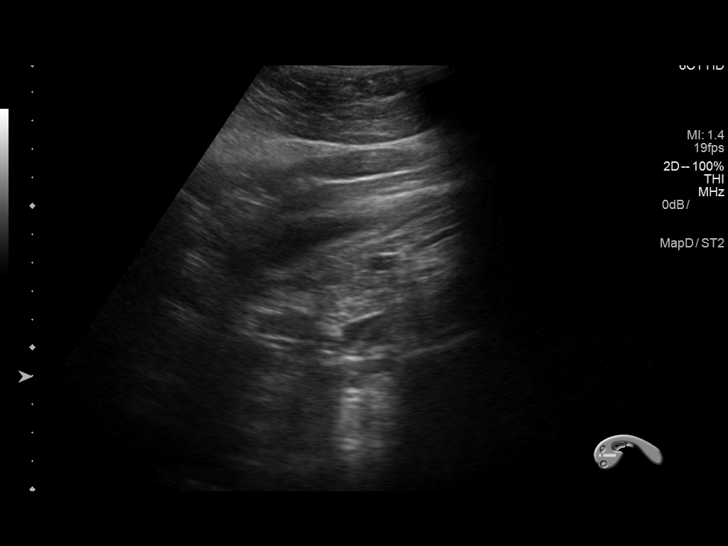
[im 51/95]
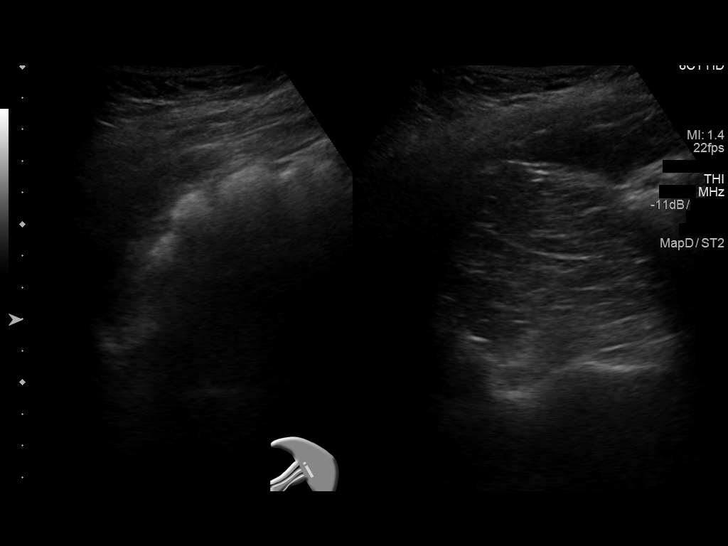
[im 59/95]
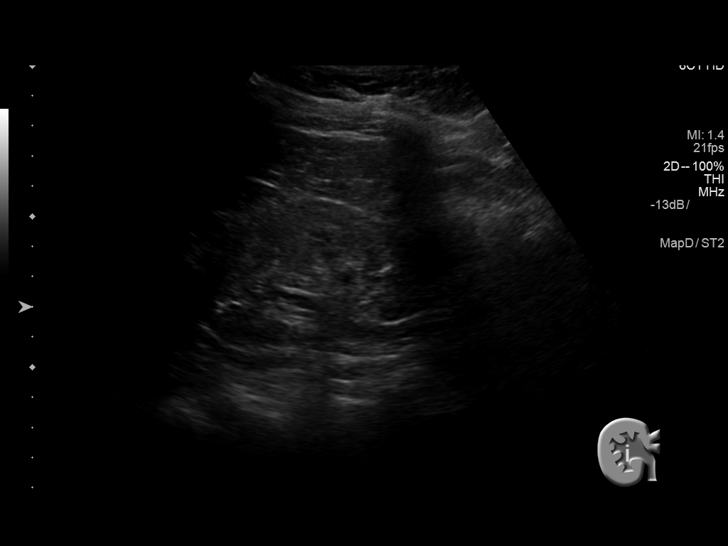
[im 63/95]
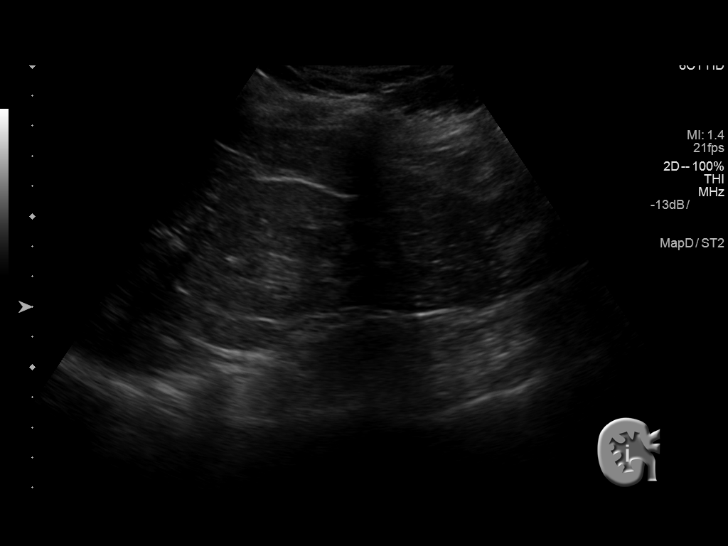
[im 71/95]
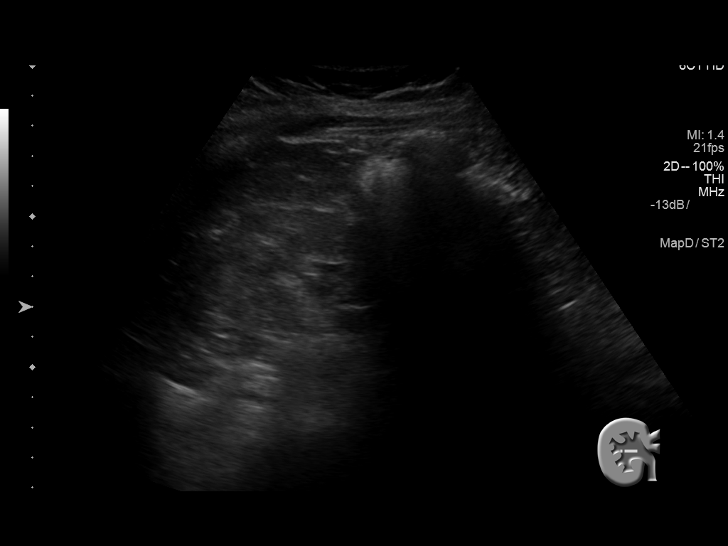
[im 79/95]
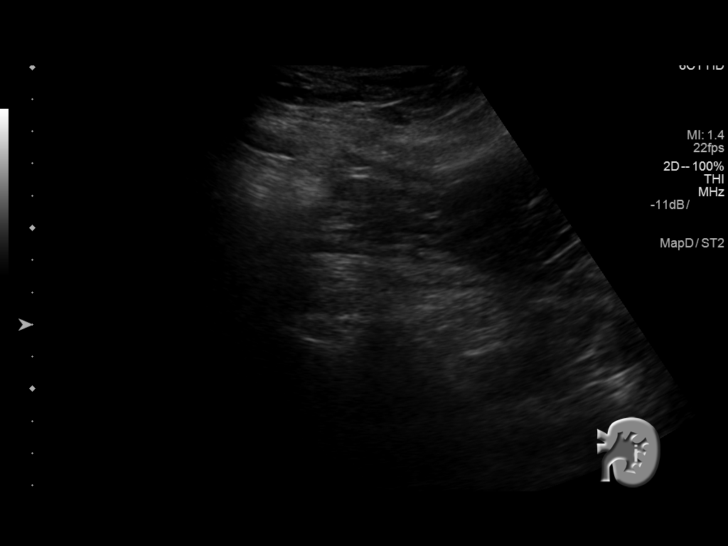
[im 87/95]
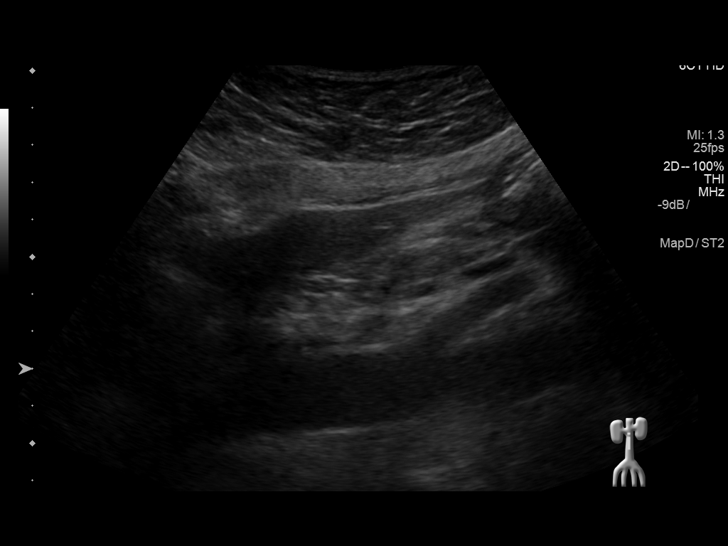
[im 95/95]
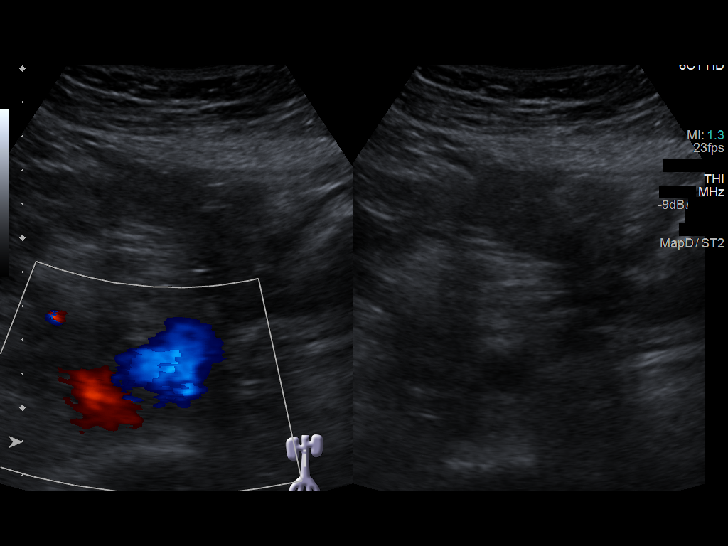

[14 of 25 positions shown; findings below may reference images not displayed]

FINDINGS: Gallbladder: No gallstones or wall thickening visualized. No
sonographic Murphy sign noted by sonographer.

Common bile duct: Diameter: 5 mm, normal

Liver: Increased hepatic parenchymal echogenicity but without focal
lesion.

IVC: No abnormality visualized.

Pancreas: Poorly seen due to overlying bowel gas.

Spleen: 5 cm, normal.

Right Kidney: Length: 8.8 cm.. Echogenic parenchyma consistent with
chronic renal parenchymal disease. No hydronephrosis.

Left Kidney: Length: Not visible sonographically..

Abdominal aorta: No aneurysm visualized.

Other findings: No ascites
IMPRESSION: Diffusely echogenic liver without focal lesion. No gallbladder
disease.

Small, echogenic right kidney. Left kidney not visible
sonographically, known to be chronically atrophic.

## 2017-11-03 ENCOUNTER — Ambulatory Visit: Payer: Medicaid Other | Admitting: Orthopaedic Surgery

## 2017-11-04 ENCOUNTER — Ambulatory Visit: Payer: Medicaid Other | Admitting: Orthopaedic Surgery

## 2018-02-09 ENCOUNTER — Telehealth (INDEPENDENT_AMBULATORY_CARE_PROVIDER_SITE_OTHER): Payer: Self-pay | Admitting: Internal Medicine

## 2018-02-09 NOTE — Telephone Encounter (Signed)
Colleen Nunez OV in 4 months

## 2018-02-11 NOTE — Telephone Encounter (Signed)
Routing to [employer]

## 2018-02-24 ENCOUNTER — Ambulatory Visit: Payer: Medicaid Other | Admitting: Orthopedic Surgery

## 2018-02-24 ENCOUNTER — Encounter: Payer: Self-pay | Admitting: Orthopedic Surgery

## 2018-02-24 VITALS — BP 147/96 | HR 73 | Ht 62.0 in | Wt 175.0 lb

## 2018-02-24 DIAGNOSIS — M7062 Trochanteric bursitis, left hip: Secondary | ICD-10-CM

## 2018-02-24 NOTE — Patient Instructions (Signed)
See DR Luna Glasgow FOR FU

## 2018-02-24 NOTE — Progress Notes (Signed)
Progress Note   Patient ID: Colleen Nunez, female   DOB: 1956/01/12, 62 y.o.   MRN: [id number]  Chief Complaint  Patient presents with  . Hip Pain    left    HPI 62 year old female requests injection left hip for trochanteric bursitis, Dr. Luna Glasgow gave her one last visit and it worked well  ROS Current Meds  Medication Sig  . amLODipine (NORVASC) 10 MG tablet Take 0.5 tablets (5 mg total) by mouth daily.  . colchicine 0.6 MG tablet Take 1 tablet (0.6 mg total) by mouth 2 (two) times daily.  . diclofenac sodium (VOLTAREN) 1 % GEL Apply 2 g topically 4 (four) times daily. To Left knee for arthritis.  Marland Kitchen diphenhydrAMINE (BENADRYL) 25 MG tablet Take 25 mg by mouth every 6 (six) hours as needed.  Marland Kitchen HYDROcodone-acetaminophen (NORCO/VICODIN) 5-325 MG tablet Take by mouth.  . methocarbamol (ROBAXIN) 500 MG tablet Take 1 tablet (500 mg total) by mouth 3 (three) times daily.  . naproxen (NAPROSYN) 500 MG tablet Take 1 tablet (500 mg total) by mouth 2 (two) times daily with a meal.    Allergies  Allergen Reactions  . Aspirin     REACTION: Hx of overdose on this years ago - too scared to take. Tried to committ suicide.  . Oxycodone Nausea And Vomiting     BP (!) 147/96   Pulse 73   Ht 5\' 2"  (1.575 m)   Wt 175 lb (79.4 kg)   BMI 32.01 kg/m   Physical Exam She has tenderness over the left greater trochanter with some swelling  Medical decision-making Encounter Diagnosis  Name Primary?  . Trochanteric bursitis of left hip Yes   Procedure note injection for   left hip bursitis  Verbal consent was obtained for injection of the left hip  Timeout was completed to confirm the injection site  The medications used were 40 mg of Depo-Medrol and 1% lidocaine 3 cc  Anesthesia was provided by ethyl chloride and the skin was prepped with alcohol.  After cleaning the skin with alcohol a 25-gauge needle was used to inject the left bursa of the hip       Arther Abbott,  MD 02/24/2018 9:54 AM

## 2018-03-02 ENCOUNTER — Telehealth (INDEPENDENT_AMBULATORY_CARE_PROVIDER_SITE_OTHER): Payer: Self-pay | Admitting: Internal Medicine

## 2018-03-02 NOTE — Telephone Encounter (Signed)
Patient called wanted to know when she needs to have lab work done - please call [phone]

## 2018-03-03 NOTE — Telephone Encounter (Signed)
Message left on answering machine 

## 2018-03-10 NOTE — Telephone Encounter (Signed)
I have called patient multiple times. She will not call back.   [name], CBC, Hepatic and Hep C quaint. Please send letter.

## 2018-03-11 ENCOUNTER — Other Ambulatory Visit (INDEPENDENT_AMBULATORY_CARE_PROVIDER_SITE_OTHER): Payer: Self-pay | Admitting: *Deleted

## 2018-03-11 ENCOUNTER — Encounter (INDEPENDENT_AMBULATORY_CARE_PROVIDER_SITE_OTHER): Payer: Self-pay | Admitting: *Deleted

## 2018-03-11 DIAGNOSIS — B182 Chronic viral hepatitis C: Secondary | ICD-10-CM

## 2018-03-11 NOTE — Telephone Encounter (Signed)
Lab work had been ordered and a letter has been sent.

## 2018-03-31 ENCOUNTER — Ambulatory Visit: Payer: Medicaid Other | Admitting: Orthopaedic Surgery

## 2018-03-31 ENCOUNTER — Encounter: Payer: Self-pay | Admitting: Orthopaedic Surgery

## 2018-03-31 DIAGNOSIS — M7062 Trochanteric bursitis, left hip: Secondary | ICD-10-CM

## 2018-03-31 NOTE — Progress Notes (Signed)
PROCEDURE NOTE:  The patient request injection, verbal consent was obtained.  The left trochanteric area of the hip was prepped appropriately after time out was performed.   Sterile technique was observed and injection of 1 cc of Depo-Medrol 40 mg with several cc's of plain xylocaine. Anesthesia was provided by ethyl chloride and a 20-gauge needle was used to inject the hip area. The injection was tolerated well.  A band aid dressing was applied.  The patient was advised to apply ice later today and tomorrow to the injection sight as needed.  Electronically Signed Sanjuana Kava, MD 5/15/20192:07 PM

## 2018-04-05 LAB — HEPATIC FUNCTION PANEL
AG RATIO: 1.1 (calc) (ref 1.0–2.5)
ALBUMIN MSPROF: 4.2 g/dL (ref 3.6–5.1)
ALT: 9 U/L (ref 6–29)
AST: 14 U/L (ref 10–35)
Alkaline phosphatase (APISO): 91 U/L (ref 33–130)
BILIRUBIN TOTAL: 0.3 mg/dL (ref 0.2–1.2)
Bilirubin, Direct: 0.1 mg/dL (ref 0.0–0.2)
Globulin: 3.7 g/dL (calc) (ref 1.9–3.7)
Indirect Bilirubin: 0.2 mg/dL (calc) (ref 0.2–1.2)
Total Protein: 7.9 g/dL (ref 6.1–8.1)

## 2018-04-05 LAB — CBC
HCT: 31.5 % — ABNORMAL LOW (ref 35.0–45.0)
HEMOGLOBIN: 10.2 g/dL — AB (ref 11.7–15.5)
MCH: 27.9 pg (ref 27.0–33.0)
MCHC: 32.4 g/dL (ref 32.0–36.0)
MCV: 86.3 fL (ref 80.0–100.0)
MPV: 10.7 fL (ref 7.5–12.5)
Platelets: 193 10*3/uL (ref 140–400)
RBC: 3.65 10*6/uL — AB (ref 3.80–5.10)
RDW: 12.7 % (ref 11.0–15.0)
WBC: 9.5 10*3/uL (ref 3.8–10.8)

## 2018-04-05 LAB — HEPATITIS C RNA QUANTITATIVE
HCV Quantitative Log: <1.18 Log IU/mL
HCV RNA, PCR, QN: <15 IU/mL

## 2018-04-29 ENCOUNTER — Ambulatory Visit: Payer: Self-pay | Admitting: Orthopaedic Surgery

## 2018-04-29 ENCOUNTER — Encounter: Payer: Self-pay | Admitting: Orthopaedic Surgery

## 2018-04-29 DIAGNOSIS — M7062 Trochanteric bursitis, left hip: Secondary | ICD-10-CM

## 2018-04-29 NOTE — Progress Notes (Signed)
PROCEDURE NOTE:  The patient request injection, verbal consent was obtained.  The left trochanteric area of the hip was prepped appropriately after time out was performed.   Sterile technique was observed and injection of 1 cc of Depo-Medrol 40 mg with several cc's of plain xylocaine. Anesthesia was provided by ethyl chloride and a 20-gauge needle was used to inject the hip area. The injection was tolerated well.  A band aid dressing was applied.  The patient was advised to apply ice later today and tomorrow to the injection sight as needed.  I will see her as needed.  Electronically Signed Sanjuana Kava, MD 6/13/201910:59 AM

## 2018-05-10 ENCOUNTER — Emergency Department (HOSPITAL_COMMUNITY): Payer: Medicaid Other

## 2018-05-10 ENCOUNTER — Emergency Department (HOSPITAL_COMMUNITY)
Admission: EM | Admit: 2018-05-10 | Discharge: 2018-05-10 | Disposition: A | Discharge status: Home / Self Care | Payer: Medicaid Other | Attending: Emergency Medicine | Admitting: Emergency Medicine

## 2018-05-10 ENCOUNTER — Encounter (HOSPITAL_COMMUNITY): Payer: Self-pay

## 2018-05-10 DIAGNOSIS — Z79899 Other long term (current) drug therapy: Secondary | ICD-10-CM | POA: Diagnosis not present

## 2018-05-10 DIAGNOSIS — R7989 Other specified abnormal findings of blood chemistry: Secondary | ICD-10-CM | POA: Diagnosis not present

## 2018-05-10 DIAGNOSIS — N184 Chronic kidney disease, stage 4 (severe): Secondary | ICD-10-CM | POA: Diagnosis not present

## 2018-05-10 DIAGNOSIS — R079 Chest pain, unspecified: Secondary | ICD-10-CM | POA: Insufficient documentation

## 2018-05-10 DIAGNOSIS — I129 Hypertensive chronic kidney disease with stage 1 through stage 4 chronic kidney disease, or unspecified chronic kidney disease: Secondary | ICD-10-CM | POA: Insufficient documentation

## 2018-05-10 LAB — BASIC METABOLIC PANEL
ANION GAP: 9 (ref 5–15)
BUN: 46 mg/dL — ABNORMAL HIGH (ref 6–20)
CHLORIDE: 105 mmol/L (ref 101–111)
CO2: 27 mmol/L (ref 22–32)
Calcium: 9.3 mg/dL (ref 8.9–10.3)
Creatinine, Ser: 2.71 mg/dL — ABNORMAL HIGH (ref 0.44–1.00)
GFR calc Af Amer: 21 mL/min — ABNORMAL LOW (ref >60)
GFR, EST NON AFRICAN AMERICAN: 18 mL/min — AB (ref >60)
GLUCOSE: 118 mg/dL — AB (ref 65–99)
POTASSIUM: 3.9 mmol/L (ref 3.5–5.1)
SODIUM: 141 mmol/L (ref 135–145)

## 2018-05-10 LAB — CBC
HEMATOCRIT: 33.2 % — AB (ref 36.0–46.0)
HEMOGLOBIN: 10.5 g/dL — AB (ref 12.0–15.0)
MCH: 28.4 pg (ref 26.0–34.0)
MCHC: 31.6 g/dL (ref 30.0–36.0)
MCV: 89.7 fL (ref 78.0–100.0)
Platelets: 227 10*3/uL (ref 150–400)
RBC: 3.7 MIL/uL — ABNORMAL LOW (ref 3.87–5.11)
RDW: 14.1 % (ref 11.5–15.5)
WBC: 8.3 10*3/uL (ref 4.0–10.5)

## 2018-05-10 LAB — TROPONIN I: Troponin I: <0.03 ng/mL (ref <0.03)

## 2018-05-10 MED ORDER — NITROGLYCERIN 0.4 MG SL SUBL
0.4000 mg | SUBLINGUAL_TABLET | Freq: Once | SUBLINGUAL | Status: AC
  Administered 2018-05-10: 0.4 mg via SUBLINGUAL
  Filled 2018-05-10: qty 1

## 2018-05-10 MED ORDER — FENTANYL CITRATE (PF) 100 MCG/2ML IJ SOLN
50.0000 ug | Freq: Once | INTRAMUSCULAR | Status: AC
  Administered 2018-05-10: 50 ug via INTRAVENOUS
  Filled 2018-05-10: qty 2

## 2018-05-10 NOTE — ED Triage Notes (Addendum)
Pt reports that she has chest pain in the center of her chest off and on for 3 days. Pain does not radiate. Pt says she is getting very tired doing her house work. Pt says certain movements and deep breaths increase pain Pt also reports that her left hip is hurting again as well

## 2018-05-10 NOTE — ED Provider Notes (Signed)
[facility] EMERGENCY DEPARTMENT Provider Note   CSN: [id number] Arrival date & time: 05/10/18  1321     History   Chief Complaint Chief Complaint  Patient presents with  . Chest Pain    HPI Colleen Nunez is a 62 y.o. female.  Patient complains of chest pain for 2 days described as heartburn.  Symptoms come and go and are not associate with any activity.  No radiation.  She took Alka-Seltzer which helped some.  Review of systems positive for dyspnea and chills but no diaphoresis or nausea.  Past medical history significant for hypertension, substance abuse, renal insufficiency, depression, many others.  No smoking.  Severity of symptoms is mild to moderate.  Nothing makes symptoms better or worse.     Past Medical History:  Diagnosis Date  . Anemia   . Anxiety   . Arthritis   . Chronic hip pain   . Chronic knee pain   . Depression    h/o suicide attempts in the past  . GERD (gastroesophageal reflux disease)   . Gout   . Hepatitis C antibody test positive       . Hypertension   . Polysubstance abuse ([place])    h/o  . Recurrent falls   . Renal insufficiency     Patient Active Problem List   Diagnosis Date Noted  . Hepatitis C 03/02/2015  . Spondylosis, cervical, with myelopathy 09/21/2013  . Quadriplegia ([place]) 09/21/2013  . Osteoarthritis of left knee 09/21/2013  . Hypokalemia 08/16/2013  . AKI (acute kidney injury) ([place]) 08/16/2013  . UTI (lower urinary tract infection) 08/07/2013  . Nausea & vomiting 08/07/2013  . Chronic kidney disease, stage IV (severe) ([place]) 08/07/2013  . Cervical myelopathy ([place]) 08/07/2013  . GERD 06/12/2009  . CHEST PAIN 06/12/2009  . LEG PAIN, BILATERAL 05/28/2009  . OBESITY 12/17/2007  . DYSPHAGIA UNSPECIFIED 12/03/2007  . ANEMIA, NORMOCYTIC 07/13/2007  . ALCOHOL ABUSE 07/13/2007  . RENAL DISEASE, CHRONIC, STAGE II 07/13/2007  . ANXIETY 06/29/2007  . DEPRESSION 06/29/2007  . Essential hypertension 06/29/2007  . CONSTIPATION  06/29/2007  . ARTHRITIS 06/29/2007  . MALAISE AND FATIGUE 06/29/2007    Past Surgical History:  Procedure Laterality Date  . ABDOMINAL HYSTERECTOMY    . ANTERIOR CERVICAL CORPECTOMY N/A 06/09/2013   Procedure: C4 C5 Corpectomy with C6-7 Anterior cervical fusion/Peek cage/Trestle plate;  Surgeon: Otilio Connors, MD;  Location: [place] [facility];  Service: Neurosurgery;  Laterality: N/A;  Cervical Four, Cervical Five Corpectomy and Cervical six-seven Anterior cervical fusion/Peek cage Three-Five /Trestle Plating Cervical Three to Cervical Seven  . ANTERIOR CERVICAL DECOMP/DISCECTOMY FUSION N/A 08/12/2013   Procedure: Repair of Anterior  Cervical CSF LEAK, lumbar drain placement.;  Surgeon: Floyce Stakes, MD;  Location: [facility];  Service: Neurosurgery;  Laterality: N/A;  . bilateral SOO and appendectomy  2006  . cartilage removal     left knee  . COLONOSCOPY N/A 07/04/2015   Procedure: COLONOSCOPY;  Surgeon: Rogene Houston, MD;  [facility];  Service: Endoscopy;  Laterality: N/A;  100  . KNEE ARTHROSCOPY    . X-STOP IMPLANTATION       OB History   None      Home Medications    Prior to Admission medications   Medication Sig Start Date End Date Taking? Authorizing Provider  acetaminophen (TYLENOL) 500 MG tablet Take 500 mg by mouth every 6 (six) hours as needed for mild pain or moderate pain.   Yes [provider]  amLODipine (NORVASC) 10 MG tablet Take 0.5 tablets (5 mg total) by mouth daily. Patient taking differently: Take 10 mg by mouth daily.  09/02/13  Yes Love, Ivan Anchors, PA-C  diclofenac sodium (VOLTAREN) 1 % GEL Apply 2 g topically 4 (four) times daily. To Left knee for arthritis. 09/02/13  Yes Love, Ivan Anchors, PA-C  gabapentin (NEURONTIN) 300 MG capsule Take 300 mg by mouth 2 (two) times daily as needed (for pain).   Yes [provider]  HYDROcodone-acetaminophen (NORCO/VICODIN) 5-325 MG tablet Take 1 tablet by mouth 2 (two) times daily as needed for  moderate pain.  01/16/16  Yes [provider]  magnesium oxide (MAG-OX) 400 MG tablet Take 400 mg by mouth 2 (two) times daily as needed.   Yes [provider]  naproxen (NAPROSYN) 500 MG tablet Take 1 tablet (500 mg total) by mouth 2 (two) times daily with a meal. Patient taking differently: Take 500 mg by mouth 2 (two) times daily as needed for mild pain or moderate pain.  07/01/17  Yes Sanjuana Kava, MD  potassium chloride (K-DUR) 10 MEQ tablet Take 10 mEq by mouth daily.   Yes [provider]  Difluprednate 0.05 % EMUL Instil 1 drop in operative eye(s) BID for 3 weeks following surgery 03/09/18   [provider]    Family History Family History  Problem Relation Age of Onset  . Diabetes Sister   . Diabetes Mother        deceased age 61  . Kidney failure Mother   . Alcohol abuse Father   . Colon cancer Neg Hx     Social History Social History   Tobacco Use  . Smoking status: Never Smoker  . Smokeless tobacco: Never Used  Substance Use Topics  . Alcohol use: Yes    Alcohol/week: 1.8 oz    Types: 3 Cans of beer per week    Comment: occasionally  . Drug use: No    Comment: h/o crack cocaine in past. Clean since 2006     Allergies   Aspirin and Oxycodone   Review of Systems Review of Systems  All other systems reviewed and are negative.    Physical Exam Updated Vital Signs BP (!) 135/91   Pulse 69   Temp 98.7 F (37.1 C) (Oral)   Resp 16   Wt 79.4 kg (175 lb)   SpO2 98%   BMI 32.01 kg/m   Physical Exam  Constitutional: She is oriented to person, place, and time. She appears well-developed and well-nourished.  HENT:  Head: Normocephalic and atraumatic.  Eyes: Conjunctivae are normal.  Neck: Neck supple.  Cardiovascular: Normal rate and regular rhythm.  Pulmonary/Chest: Effort normal and breath sounds normal.  Abdominal: Soft. Bowel sounds are normal.  Musculoskeletal: Normal range of motion.  Neurological: She is alert  and oriented to person, place, and time.  Skin: Skin is warm and dry.  Psychiatric: She has a normal mood and affect. Her behavior is normal.  Nursing note and vitals reviewed.    ED Treatments / Results  Labs (all labs ordered are listed, but only abnormal results are displayed) Labs Reviewed  BASIC METABOLIC PANEL - Abnormal; Notable for the following components:      Result Value   Glucose, Bld 118 (*)    BUN 46 (*)    Creatinine, Ser 2.71 (*)    GFR calc non Af Amer 18 (*)    GFR calc Af Amer 21 (*)    All other  components within normal limits  CBC - Abnormal; Notable for the following components:   RBC 3.70 (*)    Hemoglobin 10.5 (*)    HCT 33.2 (*)    All other components within normal limits  TROPONIN I  TROPONIN I    EKG EKG Interpretation  Date/Time:  Monday May 10 2018 13:31:36 EDT Ventricular Rate:  81 PR Interval:  170 QRS Duration: 78 QT Interval:  388 QTC Calculation: 450 R Axis:   -36 Text Interpretation:  Normal sinus rhythm Left axis deviation Cannot rule out Anterior infarct , age undetermined Abnormal ECG Confirmed by Nat Christen [phone]) on 05/10/2018 1:47:52 PM   Radiology Dg Chest 2 View  Result Date: 05/10/2018 CLINICAL DATA:  Central chest pain, intermittent for 3 days. Hypertension. EXAM: CHEST - 2 VIEW COMPARISON:  Chest x-ray dated 08/22/2013. FINDINGS: The heart size and mediastinal contours are within normal limits. Both lungs are clear. No pleural effusion or pneumothorax seen. No acute or suspicious osseous finding. Mild degenerative spurring within the thoracic spine. Scoliosis of the lower thoracic spine. Anterior cervical fusion hardware incompletely imaged at the upper aspects of the exam. IMPRESSION: No active cardiopulmonary disease. No evidence of pneumonia or pulmonary edema. Electronically Signed   By: Franki Cabot M.D.   On: 05/10/2018 14:57    Procedures Procedures (including critical care time)  Medications Ordered in  ED Medications  nitroGLYCERIN (NITROSTAT) SL tablet 0.4 mg (0.4 mg Sublingual Given 05/10/18 1545)  fentaNYL (SUBLIMAZE) injection 50 mcg (50 mcg Intravenous Given 05/10/18 2129)     Initial Impression / Assessment and Plan / ED Course  I have reviewed the triage vital signs and the nursing notes.  Pertinent labs & imaging results that were available during my care of the patient were reviewed by me and considered in my medical decision making (see chart for details).     Patient presents with atypical chest pain.  She is hemodynamically stable.  EKG, chest x-ray, troponin x2-.  She has primary care follow-up in the morning.  Discussed elevated creatinine  Final Clinical Impressions(s) / ED Diagnoses   Final diagnoses:  Chest pain, unspecified type  Creatinine elevation    ED Discharge Orders    None       Nat Christen, MD 05/10/18 2146

## 2018-05-10 NOTE — Discharge Instructions (Signed)
Your creatinine is elevated which is a measure of kidney function.  Otherwise heart tests were good.  Follow-up with your primary care doctor tomorrow.

## 2018-05-17 ENCOUNTER — Ambulatory Visit (INDEPENDENT_AMBULATORY_CARE_PROVIDER_SITE_OTHER): Payer: Medicaid Other | Admitting: Internal Medicine

## 2018-05-19 ENCOUNTER — Ambulatory Visit (INDEPENDENT_AMBULATORY_CARE_PROVIDER_SITE_OTHER): Payer: Medicaid Other | Admitting: Internal Medicine

## 2018-05-19 ENCOUNTER — Encounter (INDEPENDENT_AMBULATORY_CARE_PROVIDER_SITE_OTHER): Payer: Self-pay | Admitting: Internal Medicine

## 2018-05-19 VITALS — BP 140/80 | HR 64 | Temp 97.5°F | Ht 62.0 in | Wt 170.9 lb

## 2018-05-19 DIAGNOSIS — B182 Chronic viral hepatitis C: Secondary | ICD-10-CM | POA: Diagnosis not present

## 2018-05-19 NOTE — Progress Notes (Signed)
 Subjective:    Patient ID: Colleen Nunez, female    DOB: 03/03/1956, 61 y.o.   MRN: [id number]  HPI Here today for f/u. Last see in July of 2018.   02/27/2015 US Elastrography F0-F1 03/03/2015 Genotype 1A.  She cleared the virus. She says she is in pain from her left hip. Has osteoporosis. Followed by Dr. Keeling. She has multiple complaints with her joints and legs. Her appetite is not good.  She has gained weight about 2 pounds since her last visit.  She has a BM daily. She says she leans constipation.     04/02/2018 Hep C quaint undetected She was treated with Mavyret x 8 weeks.   05/25/2018 US abdomen:  Diffusely echogenic liver without focal lesion. No gallbladder disease. CBC    Component Value Date/Time   WBC 8.3 05/10/2018 1333   RBC 3.70 (L) 05/10/2018 1333   HGB 10.5 (L) 05/10/2018 1333   HCT 33.2 (L) 05/10/2018 1333   PLT 227 05/10/2018 1333   MCV 89.7 05/10/2018 1333   MCH 28.4 05/10/2018 1333   MCHC 31.6 05/10/2018 1333   RDW 14.1 05/10/2018 1333   LYMPHSABS 3,165 08/26/2017 0911   MONOABS 0.4 01/22/2016 1127   EOSABS 143 08/26/2017 0911   BASOSABS 30 08/26/2017 0911   Hepatic Function Latest Ref Rng & Units 04/02/2018 08/26/2017 01/22/2016  Total Protein 6.1 - 8.1 g/dL 7.9 6.9 7.3  Albumin 3.6 - 5.1 g/dL - - 3.8  AST 10 - 35 U/L 14 15 21  ALT 6 - 29 U/L 9 9 13  Alk Phosphatase 33 - 130 U/L - - 78  Total Bilirubin 0.2 - 1.2 mg/dL 0.3 0.6 0.6  Bilirubin, Direct 0.0 - 0.2 mg/dL 0.1 0.2 0.1     Hx of chronic renal insufficiency Last colonoscopy in 2016 (average risk)  3mm cecal polyp. Polyp not a adenoma. Next colonoscopy in 10 years    Review of Systems Past Medical History:  Diagnosis Date  . Anemia   . Anxiety   . Arthritis   . Chronic hip pain   . Chronic knee pain   . Depression    h/o suicide attempts in the past  . GERD (gastroesophageal reflux disease)   . Gout   . Hepatitis C antibody test positive       . Hypertension   .  Polysubstance abuse (HCC)    h/o  . Recurrent falls   . Renal insufficiency     Past Surgical History:  Procedure Laterality Date  . ABDOMINAL HYSTERECTOMY    . ANTERIOR CERVICAL CORPECTOMY N/A 06/09/2013   Procedure: C4 C5 Corpectomy with C6-7 Anterior cervical fusion/Peek cage/Trestle plate;  Surgeon: James R Hirsch, MD;  Location: [facility];  Service: Neurosurgery;  Laterality: N/A;  Cervical Four, Cervical Five Corpectomy and Cervical six-seven Anterior cervical fusion/Peek cage Three-Five /Trestle Plating Cervical Three to Cervical Seven  . ANTERIOR CERVICAL DECOMP/DISCECTOMY FUSION N/A 08/12/2013   Procedure: Repair of Anterior  Cervical CSF LEAK, lumbar drain placement.;  Surgeon: Ernesto M Botero, MD;  Location: [facility];  Service: Neurosurgery;  Laterality: N/A;  . bilateral SOO and appendectomy  2006  . cartilage removal     left knee  . COLONOSCOPY N/A 07/04/2015   Procedure: COLONOSCOPY;  Surgeon: Najeeb U Rehman, MD;  Location: [facility];  Service: Endoscopy;  Laterality: N/A;  100  . KNEE ARTHROSCOPY    . X-STOP IMPLANTATION      Allergies  Allergen Reactions  .   Aspirin     REACTION: Hx of overdose on this years ago - too scared to take. Tried to committ suicide.  . Oxycodone Nausea And Vomiting    Current Outpatient Medications on File Prior to Visit  Medication Sig Dispense Refill  . acetaminophen (TYLENOL) 500 MG tablet Take 500 mg by mouth every 6 (six) hours as needed for mild pain or moderate pain.    Marland Kitchen amLODipine (NORVASC) 10 MG tablet Take 0.5 tablets (5 mg total) by mouth daily. (Patient taking differently: Take 10 mg by mouth daily. )    . gabapentin (NEURONTIN) 300 MG capsule Take 300 mg by mouth 2 (two) times daily as needed (for pain).    Marland Kitchen HYDROcodone-acetaminophen (NORCO/VICODIN) 5-325 MG tablet Take 1 tablet by mouth 2 (two) times daily as needed for moderate pain.     . naproxen (NAPROSYN) 500 MG tablet Take 1 tablet (500 mg total) by mouth 2 (two)  times daily with a meal. (Patient taking differently: Take 500 mg by mouth 2 (two) times daily as needed for mild pain or moderate pain. ) 60 tablet 5  . potassium chloride (K-DUR) 10 MEQ tablet Take 10 mEq by mouth daily.     No current facility-administered medications on file prior to visit.         Objective:   Physical Exam Blood pressure 140/80, pulse 64, temperature (!) 97.5 F (36.4 C), height 5' 2" (1.575 m), weight 170 lb 14.4 oz (77.5 kg). Alert and oriented. Skin warm and dry. Oral mucosa is moist.   . Sclera anicteric, conjunctivae is pink. Thyroid not enlarged. No cervical lymphadenopathy. Lungs clear. Heart regular rate and rhythm.  Abdomen is soft. Bowel sounds are positive. No hepatomegaly. No abdominal masses felt. No tenderness.  No edema to lower extremities.          Assessment & Plan:  Hepatitis C. She has cleared virus. Will get an [place] RUQ, Hepatic, Hep C quaint. OV in 1 year.

## 2018-05-19 NOTE — Patient Instructions (Signed)
OV in 1 year.  

## 2018-05-22 LAB — HEPATITIS C RNA QUANTITATIVE
HCV QUANT LOG: NOT DETECTED {Log_IU}/mL
HCV RNA, PCR, QN: NOT DETECTED [IU]/mL

## 2018-05-27 ENCOUNTER — Other Ambulatory Visit: Payer: Self-pay

## 2018-05-27 ENCOUNTER — Ambulatory Visit (HOSPITAL_COMMUNITY)
Admission: RE | Admit: 2018-05-27 | Discharge: 2018-05-27 | Disposition: A | Discharge status: Home / Self Care | Payer: Medicaid Other | Source: Ambulatory Visit | Attending: Internal Medicine | Admitting: Internal Medicine

## 2018-05-27 ENCOUNTER — Emergency Department (HOSPITAL_COMMUNITY)
Admission: EM | Admit: 2018-05-27 | Discharge: 2018-05-27 | Disposition: A | Discharge status: Home / Self Care | Payer: Medicaid Other | Attending: Emergency Medicine | Admitting: Emergency Medicine

## 2018-05-27 ENCOUNTER — Encounter (HOSPITAL_COMMUNITY): Payer: Self-pay | Admitting: *Deleted

## 2018-05-27 DIAGNOSIS — Z79899 Other long term (current) drug therapy: Secondary | ICD-10-CM | POA: Diagnosis not present

## 2018-05-27 DIAGNOSIS — N184 Chronic kidney disease, stage 4 (severe): Secondary | ICD-10-CM | POA: Diagnosis not present

## 2018-05-27 DIAGNOSIS — B182 Chronic viral hepatitis C: Secondary | ICD-10-CM | POA: Diagnosis not present

## 2018-05-27 DIAGNOSIS — I129 Hypertensive chronic kidney disease with stage 1 through stage 4 chronic kidney disease, or unspecified chronic kidney disease: Secondary | ICD-10-CM | POA: Diagnosis not present

## 2018-05-27 DIAGNOSIS — Z7722 Contact with and (suspected) exposure to environmental tobacco smoke (acute) (chronic): Secondary | ICD-10-CM | POA: Insufficient documentation

## 2018-05-27 DIAGNOSIS — K047 Periapical abscess without sinus: Secondary | ICD-10-CM

## 2018-05-27 DIAGNOSIS — K0889 Other specified disorders of teeth and supporting structures: Secondary | ICD-10-CM | POA: Diagnosis present

## 2018-05-27 MED ORDER — AMOXICILLIN 250 MG PO CAPS
500.0000 mg | ORAL_CAPSULE | Freq: Once | ORAL | Status: AC
  Administered 2018-05-27: 500 mg via ORAL
  Filled 2018-05-27: qty 2

## 2018-05-27 MED ORDER — NAPROXEN 250 MG PO TABS
500.0000 mg | ORAL_TABLET | Freq: Once | ORAL | Status: AC
  Administered 2018-05-27: 500 mg via ORAL
  Filled 2018-05-27: qty 2

## 2018-05-27 MED ORDER — AMOXICILLIN 500 MG PO CAPS: 500.0000 mg | ORAL_CAPSULE | Freq: Three times a day (TID) | ORAL | 0 refills | Status: AC

## 2018-05-27 MED ORDER — NAPROXEN 500 MG PO TABS: 500.0000 mg | ORAL_TABLET | Freq: Two times a day (BID) | ORAL | 0 refills | Status: DC

## 2018-05-27 NOTE — ED Triage Notes (Addendum)
Pt c/o upper and lower right sided dental pain that started a few days ago.

## 2018-05-27 NOTE — Discharge Instructions (Addendum)
It appears you have an infection in the gumline of your right side. Complete the entire course of the antibiotics.  Add the naproxen for pain and swelling.  You may continue taking your regularly prescribed hydrocodone additionally for pain relief.  Call your dentist for further evaluation of your dental pain and infection.

## 2018-05-27 NOTE — ED Provider Notes (Signed)
[facility] EMERGENCY DEPARTMENT Provider Note   CSN: [id number] Arrival date & time: 05/27/18  3664     History   Chief Complaint Chief Complaint  Patient presents with  . Dental Pain    HPI Colleen Nunez is a 62 y.o. female  Presenting with a several day history of dental pain and gingival swelling.   The patient has a history of decay in the teeth involved which has recently started to cause increased  pain. She endorses cold sensitivity on her right side, both upper and lower.  There has been no fevers, chills, nausea or vomiting, also no complaint of difficulty swallowing, although chewing makes pain worse.  The patient has tried tylenol without relief of symptoms.      HPI  Past Medical History:  Diagnosis Date  . Anemia   . Anxiety   . Arthritis   . Chronic hip pain   . Chronic knee pain   . Depression    h/o suicide attempts in the past  . GERD (gastroesophageal reflux disease)   . Gout   . Hepatitis C antibody test positive       . Hypertension   . Polysubstance abuse ([place])    h/o  . Recurrent falls   . Renal insufficiency     Patient Active Problem List   Diagnosis Date Noted  . Hepatitis C 03/02/2015  . Spondylosis, cervical, with myelopathy 09/21/2013  . Quadriplegia ([place]) 09/21/2013  . Osteoarthritis of left knee 09/21/2013  . Hypokalemia 08/16/2013  . AKI (acute kidney injury) ([place]) 08/16/2013  . UTI (lower urinary tract infection) 08/07/2013  . Nausea & vomiting 08/07/2013  . Chronic kidney disease, stage IV (severe) ([place]) 08/07/2013  . Cervical myelopathy ([place]) 08/07/2013  . GERD 06/12/2009  . CHEST PAIN 06/12/2009  . LEG PAIN, BILATERAL 05/28/2009  . OBESITY 12/17/2007  . DYSPHAGIA UNSPECIFIED 12/03/2007  . ANEMIA, NORMOCYTIC 07/13/2007  . ALCOHOL ABUSE 07/13/2007  . RENAL DISEASE, CHRONIC, STAGE II 07/13/2007  . ANXIETY 06/29/2007  . DEPRESSION 06/29/2007  . Essential hypertension 06/29/2007  . CONSTIPATION 06/29/2007  . ARTHRITIS  06/29/2007  . MALAISE AND FATIGUE 06/29/2007    Past Surgical History:  Procedure Laterality Date  . ABDOMINAL HYSTERECTOMY    . ANTERIOR CERVICAL CORPECTOMY N/A 06/09/2013   Procedure: C4 C5 Corpectomy with C6-7 Anterior cervical fusion/Peek cage/Trestle plate;  Surgeon: Otilio Connors, MD;  Location: [place] [facility];  Service: Neurosurgery;  Laterality: N/A;  Cervical Four, Cervical Five Corpectomy and Cervical six-seven Anterior cervical fusion/Peek cage Three-Five /Trestle Plating Cervical Three to Cervical Seven  . ANTERIOR CERVICAL DECOMP/DISCECTOMY FUSION N/A 08/12/2013   Procedure: Repair of Anterior  Cervical CSF LEAK, lumbar drain placement.;  Surgeon: Floyce Stakes, MD;  Location: [place];  Service: Neurosurgery;  Laterality: N/A;  . bilateral SOO and appendectomy  2006  . cartilage removal     left knee  . COLONOSCOPY N/A 07/04/2015   Procedure: COLONOSCOPY;  Surgeon: Rogene Houston, MD;  [facility]: [facility];  Service: Endoscopy;  Laterality: N/A;  100  . KNEE ARTHROSCOPY    . X-STOP IMPLANTATION       OB History   None      Home Medications    Prior to Admission medications   Medication Sig Start Date End Date Taking? Authorizing Provider  acetaminophen (TYLENOL) 500 MG tablet Take 500 mg by mouth every 6 (six) hours as needed for mild pain or moderate pain.    [provider]  amLODipine (NORVASC) 10 MG tablet Take 0.5 tablets (5 mg total) by mouth daily. Patient taking differently: Take 10 mg by mouth daily.  09/02/13   Love, Ivan Anchors, PA-C  amoxicillin (AMOXIL) 500 MG capsule Take 1 capsule (500 mg total) by mouth 3 (three) times daily for 10 days. 05/27/18 06/06/18  Evalee Jefferson, PA-C  gabapentin (NEURONTIN) 300 MG capsule Take 300 mg by mouth 2 (two) times daily as needed (for pain).    [provider]  HYDROcodone-acetaminophen (NORCO/VICODIN) 5-325 MG tablet Take 1 tablet by mouth 2 (two) times daily as needed for moderate pain.  01/16/16    [provider]  naproxen (NAPROSYN) 500 MG tablet Take 1 tablet (500 mg total) by mouth 2 (two) times daily. 05/27/18   Evalee Jefferson, PA-C  potassium chloride (K-DUR) 10 MEQ tablet Take 10 mEq by mouth daily.    [provider]    Family History Family History  Problem Relation Age of Onset  . Diabetes Sister   . Diabetes Mother        deceased age 33  . Kidney failure Mother   . Alcohol abuse Father   . Colon cancer Neg Hx     Social History Social History   Tobacco Use  . Smoking status: Passive Smoke Exposure - Never Smoker  . Smokeless tobacco: Never Used  Substance Use Topics  . Alcohol use: Yes    Alcohol/week: 1.8 oz    Types: 3 Cans of beer per week    Comment: occasionally  . Drug use: No    Comment: h/o crack cocaine in past. Clean since 2006     Allergies   Aspirin and Oxycodone   Review of Systems Review of Systems  Constitutional: Negative for fever.  HENT: Positive for dental problem. Negative for facial swelling and sore throat.   Respiratory: Negative for shortness of breath.   Musculoskeletal: Negative for neck pain and neck stiffness.     Physical Exam Updated Vital Signs BP (!) 154/85   Pulse 67   Temp 98 F (36.7 C) (Oral)   Resp 16   Ht 5\' 2"  (1.575 m)   Wt 77.1 kg (170 lb)   SpO2 100%   BMI 31.09 kg/m   Physical Exam  Constitutional: She is oriented to person, place, and time. She appears well-developed and well-nourished. No distress.  HENT:  Head: Normocephalic and atraumatic.  Right Ear: Tympanic membrane and external ear normal.  Left Ear: Tympanic membrane and external ear normal.  Mouth/Throat: Oropharynx is clear and moist and mucous membranes are normal. No oral lesions. No trismus in the jaw. Abnormal dentition. No dental abscesses or uvula swelling. No oropharyngeal exudate or posterior oropharyngeal edema.  Multiple fillings, decay noted right upper 2nd premolar.  Mild gingival edema noted both upper  and lower right buccal side without fluctuance or abscess formation.  No drainage. No trismus, sublingual space soft.  Eyes: Conjunctivae are normal.  Neck: Normal range of motion. Neck supple.  Cardiovascular: Normal rate.  Pulmonary/Chest: Effort normal.  Musculoskeletal: Normal range of motion.  Lymphadenopathy:       Head (right side): Submandibular adenopathy present.    She has no cervical adenopathy.  Neurological: She is alert and oriented to person, place, and time.  Skin: Skin is warm and dry. No erythema.  Psychiatric: She has a normal mood and affect.     ED Treatments / Results  Labs (all labs ordered are listed, but only abnormal results  are displayed) Labs Reviewed - No data to display  EKG None  Radiology No results found.  Procedures Procedures (including critical care time)  Medications Ordered in ED Medications  naproxen (NAPROSYN) tablet 500 mg (has no administration in time range)  amoxicillin (AMOXIL) capsule 500 mg (has no administration in time range)     Initial Impression / Assessment and Plan / ED Course  I have reviewed the triage vital signs and the nursing notes.  Pertinent labs & imaging results that were available during my care of the patient were reviewed by me and considered in my medical decision making (see chart for details).     Pt placed on amoxil and naproxen, advised f/u with dentistry (sees Dr Arnoldo Morale in [place]).  Prn f/u anticipated.  No facial induration or cellulitis. No signs of ludwigs angina.   [place] controlled substance database reviewed. Pt receives #60 hydrocodone monthly from Dr. Merlene Laughter.   Final Clinical Impressions(s) / ED Diagnoses   Final diagnoses:  Dental infection    ED Discharge Orders        Ordered    amoxicillin (AMOXIL) 500 MG capsule  3 times daily     05/27/18 0927    naproxen (NAPROSYN) 500 MG tablet  2 times daily     05/27/18 5003       Evalee Jefferson, PA-C 05/27/18 7048    Virgel Manifold,  MD 05/28/18 [phone]

## 2018-07-08 ENCOUNTER — Encounter: Payer: Self-pay | Admitting: Orthopaedic Surgery

## 2018-07-08 ENCOUNTER — Ambulatory Visit: Payer: Medicaid Other | Admitting: Orthopaedic Surgery

## 2018-07-08 VITALS — BP 131/83 | HR 86 | Ht 62.0 in | Wt 169.0 lb

## 2018-07-08 DIAGNOSIS — G8929 Other chronic pain: Secondary | ICD-10-CM | POA: Diagnosis not present

## 2018-07-08 DIAGNOSIS — M25561 Pain in right knee: Secondary | ICD-10-CM | POA: Diagnosis not present

## 2018-07-08 NOTE — Progress Notes (Signed)
CC:  I have pain of my right knee. I would like an injection.  The patient has chronic pain of the right knee.  There is no recent trauma.  There is no redness.  Injections in the past have helped.  The knee has no redness, has an effusion and crepitus present.  ROM of the right knee is 0-100.  Impression:  Chronic knee pain right  Return: 2 months  PROCEDURE NOTE:  The patient requests injections of the right knee , verbal consent was obtained.  The right knee was prepped appropriately after time out was performed.   Sterile technique was observed and injection of 1 cc of Depo-Medrol 40 mg with several cc's of plain xylocaine. Anesthesia was provided by ethyl chloride and a 20-gauge needle was used to inject the knee area. The injection was tolerated well.  A band aid dressing was applied.  The patient was advised to apply ice later today and tomorrow to the injection sight as needed.  Electronically Signed Sanjuana Kava, MD 8/22/201910:45 AM

## 2018-09-02 ENCOUNTER — Ambulatory Visit: Payer: Medicaid Other | Admitting: Orthopaedic Surgery

## 2018-09-08 ENCOUNTER — Ambulatory Visit: Payer: Medicaid Other | Admitting: Orthopaedic Surgery

## 2018-09-21 ENCOUNTER — Ambulatory Visit: Payer: Medicaid Other | Admitting: Orthopaedic Surgery

## 2018-09-21 ENCOUNTER — Encounter: Payer: Self-pay | Admitting: Orthopaedic Surgery

## 2018-09-21 VITALS — BP 120/82 | HR 82 | Ht 62.0 in | Wt 170.0 lb

## 2018-09-21 DIAGNOSIS — M7062 Trochanteric bursitis, left hip: Secondary | ICD-10-CM

## 2018-09-21 DIAGNOSIS — B351 Tinea unguium: Secondary | ICD-10-CM

## 2018-09-21 NOTE — Progress Notes (Signed)
Patient BO:FBPZWCH Colleen Nunez, female DOB:[birth year], 62 y.o. ENI:[id number]  Chief Complaint  Patient presents with  . Hip Pain    left wants injection  . Nail Problem    wants toenails clipped     HPI  Colleen Nunez is a 62 y.o. female who has left hip trochanteric bursitis that has flared up and also has painful toes from long nails and nail fungus.  She has no trauma, no redness.     Body mass index is 31.09 kg/m.  ROS  Review of Systems  Constitutional:       Patient does not have Diabetes Mellitus. Patient has hypertension. Patient has COPD or shortness of breath. Patient does not have BMI > 35. Patient does not have current smoking history.  HENT: Negative for congestion.   Respiratory: Negative for cough and shortness of breath.   Cardiovascular: Negative for chest pain.  Gastrointestinal:       GERD   Endocrine: Positive for cold intolerance.  Musculoskeletal: Positive for arthralgias, gait problem, joint swelling and myalgias.  Allergic/Immunologic: Negative for environmental allergies.  All other systems reviewed and are negative.   All other systems reviewed and are negative.  The following is a summary of the past history medically, past history surgically, known current medicines, social history and family history.  This information is gathered electronically by the computer from prior information and documentation.  I review this each visit and have found including this information at this point in the chart is beneficial and informative.    Past Medical History:  Diagnosis Date  . Anemia   . Anxiety   . Arthritis   . Chronic hip pain   . Chronic knee pain   . Depression    h/o suicide attempts in the past  . GERD (gastroesophageal reflux disease)   . Gout   . Hepatitis C antibody test positive       . Hypertension   . Polysubstance abuse ([place])    h/o  . Recurrent falls   . Renal insufficiency     Past Surgical History:  Procedure  Laterality Date  . ABDOMINAL HYSTERECTOMY    . ANTERIOR CERVICAL CORPECTOMY N/A 06/09/2013   Procedure: C4 C5 Corpectomy with C6-7 Anterior cervical fusion/Peek cage/Trestle plate;  Surgeon: Otilio Connors, MD;  Location: [place] [facility];  Service: Neurosurgery;  Laterality: N/A;  Cervical Four, Cervical Five Corpectomy and Cervical six-seven Anterior cervical fusion/Peek cage Three-Five /Trestle Plating Cervical Three to Cervical Seven  . ANTERIOR CERVICAL DECOMP/DISCECTOMY FUSION N/A 08/12/2013   Procedure: Repair of Anterior  Cervical CSF LEAK, lumbar drain placement.;  Surgeon: Floyce Stakes, MD;  Location: [facility];  Service: Neurosurgery;  Laterality: N/A;  . bilateral SOO and appendectomy  2006  . cartilage removal     left knee  . COLONOSCOPY N/A 07/04/2015   Procedure: COLONOSCOPY;  Surgeon: Rogene Houston, MD;  [facility];  Service: Endoscopy;  Laterality: N/A;  100  . KNEE ARTHROSCOPY    . X-STOP IMPLANTATION      Family History  Problem Relation Age of Onset  . Diabetes Sister   . Diabetes Mother        deceased age 83  . Kidney failure Mother   . Alcohol abuse Father   . Colon cancer Neg Hx     Social History Social History   Tobacco Use  . Smoking status: Passive Smoke Exposure - Never Smoker  . Smokeless tobacco: Never Used  Substance Use Topics  . Alcohol use: Yes    Alcohol/week: 3.0 standard drinks    Types: 3 Cans of beer per week    Comment: occasionally  . Drug use: No    Comment: h/o crack cocaine in past. Clean since 2006    Allergies  Allergen Reactions  . Aspirin     REACTION: Hx of overdose on this years ago - too scared to take. Tried to committ suicide.  . Oxycodone Nausea And Vomiting    Current Outpatient Medications  Medication Sig Dispense Refill  . acetaminophen (TYLENOL) 500 MG tablet Take 500 mg by mouth every 6 (six) hours as needed for mild pain or moderate pain.    Marland Kitchen amLODipine (NORVASC) 10 MG tablet Take 0.5 tablets (5  mg total) by mouth daily. (Patient taking differently: Take 10 mg by mouth daily. )    . gabapentin (NEURONTIN) 300 MG capsule Take 300 mg by mouth 2 (two) times daily as needed (for pain).    Marland Kitchen HYDROcodone-acetaminophen (NORCO/VICODIN) 5-325 MG tablet Take 1 tablet by mouth 2 (two) times daily as needed for moderate pain.     . naproxen (NAPROSYN) 500 MG tablet Take 1 tablet (500 mg total) by mouth 2 (two) times daily. 20 tablet 0  . potassium chloride (K-DUR) 10 MEQ tablet Take 10 mEq by mouth daily.     No current facility-administered medications for this visit.      Physical Exam  Blood pressure 120/82, pulse 82, height 5\' 2"  (1.575 m), weight 170 lb (77.1 kg).  Constitutional: overall normal hygiene, normal nutrition, well developed, normal grooming, normal body habitus. Assistive device:none  Musculoskeletal: gait and station Limp left, muscle tone and strength are normal, no tremors or atrophy is present.  .  Neurological: coordination overall normal.  Deep tendon reflex/nerve stretch intact.  Sensation normal.  Cranial nerves II-XII intact.   Skin:   Normal overall no scars, lesions, ulcers or rashes. No psoriasis.  Psychiatric: Alert and oriented x 3.  Recent memory intact, remote memory unclear.  Normal mood and affect. Well groomed.  Good eye contact.  Cardiovascular: overall no swelling, no varicosities, no edema bilaterally, normal temperatures of the legs and arms, no clubbing, cyanosis and good capillary refill.  Lymphatic: palpation is normal.  Left hip is tender over the trochanteric area.  She has no swelling or redness.  NV intact.  Her nails on her feet are very long  She has toe fungus as well.    I pared the nails and trimmed them.  All other systems reviewed and are negative   The patient has been educated about the nature of the problem(s) and counseled on treatment options.  The patient appeared to understand what I have discussed and is in agreement  with it.  Encounter Diagnoses  Name Primary?  . Trochanteric bursitis of left hip Yes  . Toenail fungus     PLAN Call if any problems.  Precautions discussed.  Continue current medications.   Return to clinic 1 month   PROCEDURE NOTE:  The patient request injection, verbal consent was obtained.  The left trochanteric area of the hip was prepped appropriately after time out was performed.   Sterile technique was observed and injection of 1 cc of Depo-Medrol 40 mg with several cc's of plain xylocaine. Anesthesia was provided by ethyl chloride and a 20-gauge needle was used to inject the hip area. The injection was tolerated well.  A band aid dressing was applied.  The patient was advised to apply ice later today and tomorrow to the injection sight as needed.  Electronically Signed Sanjuana Kava, MD 11/5/201910:19 AM

## 2018-10-07 DIAGNOSIS — M130 Polyarthritis, unspecified: Secondary | ICD-10-CM | POA: Insufficient documentation

## 2018-10-19 ENCOUNTER — Telehealth: Payer: Self-pay | Admitting: Orthopaedic Surgery

## 2018-10-19 ENCOUNTER — Ambulatory Visit: Payer: Medicaid Other | Admitting: Orthopaedic Surgery

## 2018-10-19 NOTE — Telephone Encounter (Signed)
Patient was unable to keep today's appointment for hip; states getting ready for her knee replacement surgery at [facility], Friday 10/22/18.  Will call back when able to re-schedule.

## 2018-10-23 DIAGNOSIS — Z789 Other specified health status: Secondary | ICD-10-CM | POA: Insufficient documentation

## 2018-10-23 DIAGNOSIS — Z7289 Other problems related to lifestyle: Secondary | ICD-10-CM | POA: Insufficient documentation

## 2018-11-19 ENCOUNTER — Other Ambulatory Visit: Payer: Self-pay

## 2018-11-19 ENCOUNTER — Emergency Department (HOSPITAL_COMMUNITY)
Admission: EM | Admit: 2018-11-19 | Discharge: 2018-11-19 | Disposition: A | Discharge status: Home / Self Care | Payer: Medicaid Other | Attending: Emergency Medicine | Admitting: Emergency Medicine

## 2018-11-19 ENCOUNTER — Encounter (HOSPITAL_COMMUNITY): Payer: Self-pay

## 2018-11-19 DIAGNOSIS — Z4802 Encounter for removal of sutures: Secondary | ICD-10-CM | POA: Insufficient documentation

## 2018-11-19 NOTE — Discharge Instructions (Addendum)
As discussed, your incision site looks very healthy and well-healed.  I have applied a few Steri-Strips for extra support for the next several days.  These should fall away on their own.

## 2018-11-19 NOTE — ED Provider Notes (Signed)
[facility] EMERGENCY DEPARTMENT Provider Note   CSN: [id number] Arrival date & time: 11/19/18  1340     History   Chief Complaint Chief Complaint  Patient presents with  . Suture / Staple Removal    HPI Colleen Nunez is a 63 y.o. female underwent a total knee replacement on December 6 at [facility] and due to transportation issues and has been unable to get back to this provider for follow-up care including suture removal.  She discussed this with the provider today who recommended that the stitches needed to be out today and she could come to the emergency room for this.  She is scheduled to see him on December 9 for her first postop visit and to get physical therapy initiated.  Denies any problems with the incision site, specifically no increased pain, swelling or drainage.  She did bump the site about a week ago and there was a small area that bled which has resolved.  She does endorse a small patch of skin next to the incision site which is been numb since her surgery, this has not changed.  The history is provided by the patient.   Nursing notes were reviewed from outside hospital with documentation from today advising patient to present at her closest facility for suture removal today.  Past Medical History:  Diagnosis Date  . Anemia   . Anxiety   . Arthritis   . Chronic hip pain   . Chronic knee pain   . Depression    h/o suicide attempts in the past  . GERD (gastroesophageal reflux disease)   . Gout   . Hepatitis C antibody test positive       . Hypertension   . Polysubstance abuse ([place])    h/o  . Recurrent falls   . Renal insufficiency     Patient Active Problem List   Diagnosis Date Noted  . Hepatitis C 03/02/2015  . Spondylosis, cervical, with myelopathy 09/21/2013  . Quadriplegia ([place]) 09/21/2013  . Osteoarthritis of left knee 09/21/2013  . Hypokalemia 08/16/2013  . AKI (acute kidney injury) ([place]) 08/16/2013  . UTI (lower urinary tract infection)  08/07/2013  . Nausea & vomiting 08/07/2013  . Chronic kidney disease, stage IV (severe) ([place]) 08/07/2013  . Cervical myelopathy ([place]) 08/07/2013  . GERD 06/12/2009  . CHEST PAIN 06/12/2009  . LEG PAIN, BILATERAL 05/28/2009  . OBESITY 12/17/2007  . DYSPHAGIA UNSPECIFIED 12/03/2007  . ANEMIA, NORMOCYTIC 07/13/2007  . ALCOHOL ABUSE 07/13/2007  . RENAL DISEASE, CHRONIC, STAGE II 07/13/2007  . ANXIETY 06/29/2007  . DEPRESSION 06/29/2007  . Essential hypertension 06/29/2007  . CONSTIPATION 06/29/2007  . ARTHRITIS 06/29/2007  . MALAISE AND FATIGUE 06/29/2007    Past Surgical History:  Procedure Laterality Date  . ABDOMINAL HYSTERECTOMY    . ANTERIOR CERVICAL CORPECTOMY N/A 06/09/2013   Procedure: C4 C5 Corpectomy with C6-7 Anterior cervical fusion/Peek cage/Trestle plate;  Surgeon: Otilio Connors, MD;  Location: [place] [facility]URO ORS;  Service: Neurosurgery;  Laterality: N/A;  Cervical Four, Cervical Five Corpectomy and Cervical six-seven Anterior cervical fusion/Peek cage Three-Five /Trestle Plating Cervical Three to Cervical Seven  . ANTERIOR CERVICAL DECOMP/DISCECTOMY FUSION N/A 08/12/2013   Procedure: Repair of Anterior  Cervical CSF LEAK, lumbar drain placement.;  Surgeon: Floyce Stakes, MD;  Location: [facility];  Service: Neurosurgery;  Laterality: N/A;  . bilateral SOO and appendectomy  2006  . cartilage removal     left knee  . COLONOSCOPY N/A 07/04/2015   Procedure: COLONOSCOPY;  Surgeon: Rogene Houston, MD;  [facility];  Service: Endoscopy;  Laterality: N/A;  100  . KNEE ARTHROSCOPY    . X-STOP IMPLANTATION       OB History   No obstetric history on file.      Home Medications    Prior to Admission medications   Medication Sig Start Date End Date Taking? Authorizing Provider  acetaminophen (TYLENOL) 500 MG tablet Take 500 mg by mouth every 6 (six) hours as needed for mild pain or moderate pain.    [provider]  amLODipine (NORVASC) 10 MG tablet Take 0.5  tablets (5 mg total) by mouth daily. Patient taking differently: Take 10 mg by mouth daily.  09/02/13   Love, Ivan Anchors, PA-C  gabapentin (NEURONTIN) 300 MG capsule Take 300 mg by mouth 2 (two) times daily as needed (for pain).    [provider]  HYDROcodone-acetaminophen (NORCO/VICODIN) 5-325 MG tablet Take 1 tablet by mouth 2 (two) times daily as needed for moderate pain.  01/16/16   [provider]  naproxen (NAPROSYN) 500 MG tablet Take 1 tablet (500 mg total) by mouth 2 (two) times daily. 05/27/18   Evalee Jefferson, PA-C  potassium chloride (K-DUR) 10 MEQ tablet Take 10 mEq by mouth daily.    [provider]    Family History Family History  Problem Relation Age of Onset  . Diabetes Sister   . Diabetes Mother        deceased age 2  . Kidney failure Mother   . Alcohol abuse Father   . Colon cancer Neg Hx     Social History Social History   Tobacco Use  . Smoking status: Passive Smoke Exposure - Never Smoker  . Smokeless tobacco: Never Used  Substance Use Topics  . Alcohol use: Yes    Alcohol/week: 3.0 standard drinks    Types: 3 Cans of beer per week    Comment: occasionally  . Drug use: No    Comment: h/o crack cocaine in past. Clean since 2006     Allergies   Aspirin and Oxycodone   Review of Systems Review of Systems  Constitutional: Negative for chills and fever.  Respiratory: Negative for shortness of breath and wheezing.   Skin: Positive for wound.     Physical Exam Updated Vital Signs BP (!) 149/108 (BP Location: Right Arm)   Pulse 97   Temp 97.7 F (36.5 C) (Oral)   Resp 18   Ht 5\' 2"  (1.575 m)   Wt 75.4 kg   SpO2 100%   BMI 30.40 kg/m   Physical Exam Constitutional:      Appearance: She is well-developed.  HENT:     Head: Normocephalic.  Cardiovascular:     Rate and Rhythm: Normal rate.  Pulmonary:     Effort: Pulmonary effort is normal.  Musculoskeletal:        General: No swelling or deformity.     Left lower  leg: No edema.  Skin:    General: Skin is warm and dry.     Comments: Very well healed appearing incision site at the right knee.  There is no drainage or bleeding.  Incision edges are well approximated.  No surrounding erythema or significant edema.  Neurological:     Mental Status: She is alert and oriented to person, place, and time.     Sensory: No sensory deficit.      ED Treatments / Results  Labs (all labs ordered are listed,  but only abnormal results are displayed) Labs Reviewed - No data to display  EKG None  Radiology No results found.  Procedures Procedures (including critical care time)  SUTURE REMOVAL Performed by: Evalee Jefferson  Consent: Verbal consent obtained. Patient identity confirmed: provided demographic data Time out: Immediately prior to procedure a "time out" was called to verify the correct patient, procedure, equipment, support staff and site/side marked as required.  Location details: right knee  Wound Appearance: clean  Sutures/Staples Removed: #28 running sutures.  No wound dehiscensce,no drainage or bleeding. The knots were very embedded within the skin but were removed without complication.  Sterile strips applied for support of wound edges, probably not necessary but will offer strength for the next few days.   Facility: outside facility Patient tolerance: Patient tolerated the procedure well with no immediate complications.     Medications Ordered in ED Medications - No data to display   Initial Impression / Assessment and Plan / ED Course  I have reviewed the triage vital signs and the nursing notes.  Pertinent labs & imaging results that were available during my care of the patient were reviewed by me and considered in my medical decision making (see chart for details).     Patient was advised to follow-up with her orthopedic surgeon on the ninth as she has already scheduled.  PRN follow-up here anticipated.  Final Clinical  Impressions(s) / ED Diagnoses   Final diagnoses:  Visit for suture removal    ED Discharge Orders    None       Landis Martins 11/19/18 1705    Davonna Belling, MD 11/20/18 1534

## 2018-11-19 NOTE — ED Triage Notes (Signed)
Pt had a knee surgery on December 6. States she missed her appointment to get her stiches out due to transportation issues. Wants to be seen here to get them taken out. Has a followup appointment with her surgeon on December 9.

## 2018-11-19 NOTE — ED Notes (Signed)
Sutures intact. No redness or drainage at site

## 2018-11-24 DIAGNOSIS — Z96651 Presence of right artificial knee joint: Secondary | ICD-10-CM | POA: Insufficient documentation

## 2019-06-07 ENCOUNTER — Ambulatory Visit: Payer: Medicaid Other | Admitting: Orthopaedic Surgery

## 2019-06-07 ENCOUNTER — Other Ambulatory Visit: Payer: Self-pay

## 2019-06-07 ENCOUNTER — Encounter: Payer: Self-pay | Admitting: Orthopaedic Surgery

## 2019-06-07 VITALS — Temp 97.9°F

## 2019-06-07 DIAGNOSIS — M24542 Contracture, left hand: Secondary | ICD-10-CM

## 2019-06-07 DIAGNOSIS — M7062 Trochanteric bursitis, left hip: Secondary | ICD-10-CM | POA: Diagnosis not present

## 2019-06-07 NOTE — Progress Notes (Signed)
PROCEDURE NOTE:  The patient request injection, verbal consent was obtained.  The left trochanteric area of the hip was prepped appropriately after time out was performed.   Sterile technique was observed and injection of 1 cc of Depo-Medrol 40 mg with several cc's of plain xylocaine. Anesthesia was provided by ethyl chloride and a 20-gauge needle was used to inject the hip area. The injection was tolerated well.  A band aid dressing was applied.  The patient was advised to apply ice later today and tomorrow to the injection sight as needed.  She has hand contracture and will need splint on the left long finger.  I will send her to OT but she may need to see hand surgeon.  Electronically Signed Sanjuana Kava, MD 7/21/20209:09 AM

## 2019-06-14 ENCOUNTER — Other Ambulatory Visit: Payer: Self-pay

## 2019-06-14 ENCOUNTER — Ambulatory Visit (HOSPITAL_COMMUNITY): Payer: Medicaid Other | Attending: Orthopaedic Surgery

## 2019-06-14 ENCOUNTER — Encounter (HOSPITAL_COMMUNITY): Payer: Self-pay

## 2019-06-14 DIAGNOSIS — M25642 Stiffness of left hand, not elsewhere classified: Secondary | ICD-10-CM

## 2019-06-14 DIAGNOSIS — R29898 Other symptoms and signs involving the musculoskeletal system: Secondary | ICD-10-CM

## 2019-06-14 NOTE — Therapy (Signed)
[facility] [address] [place], [place], [address] Phone: [phone]   Fax:  [phone]  Occupational Therapy Evaluation  Patient Details  Name: Colleen Nunez MRN: [id number] Date of Birth: 01/27/1956 Referring Provider (OT): Dr. Sanjuana Kava   Encounter Date: 06/14/2019  OT End of Session - 06/14/19 1026    Visit Number  1    Number of Visits  1    Authorization Type  Medicaid    OT Start Time  8756    OT Stop Time  0930    OT Time Calculation (min)  55 min    Activity Tolerance  Patient tolerated treatment well    Behavior During Therapy  [facility] for tasks assessed/performed       Past Medical History:  Diagnosis Date  . Anemia   . Anxiety   . Arthritis   . Chronic hip pain   . Chronic knee pain   . Depression    h/o suicide attempts in the past  . GERD (gastroesophageal reflux disease)   . Gout   . Hepatitis C antibody test positive       . Hypertension   . Polysubstance abuse ([place])    h/o  . Recurrent falls   . Renal insufficiency     Past Surgical History:  Procedure Laterality Date  . ABDOMINAL HYSTERECTOMY    . ANTERIOR CERVICAL CORPECTOMY N/A 06/09/2013   Procedure: C4 C5 Corpectomy with C6-7 Anterior cervical fusion/Peek cage/Trestle plate;  Surgeon: Otilio Connors, MD;  Location: [facility] NEURO ORS;  Service: Neurosurgery;  Laterality: N/A;  Cervical Four, Cervical Five Corpectomy and Cervical six-seven Anterior cervical fusion/Peek cage Three-Five /Trestle Plating Cervical Three to Cervical Seven  . ANTERIOR CERVICAL DECOMP/DISCECTOMY FUSION N/A 08/12/2013   Procedure: Repair of Anterior  Cervical CSF LEAK, lumbar drain placement.;  Surgeon: Floyce Stakes, MD;  Location: [facility];  Service: Neurosurgery;  Laterality: N/A;  . bilateral SOO and appendectomy  2006  . cartilage removal     left knee  . COLONOSCOPY N/A 07/04/2015   Procedure: COLONOSCOPY;  Surgeon: Rogene Houston, MD;  [facility];   Service: Endoscopy;  Laterality: N/A;  100  . KNEE ARTHROSCOPY    . X-STOP IMPLANTATION      There were no vitals filed for this visit.  Subjective Assessment - 06/14/19 0959    Subjective   S: it's been like this for years and it's gotten worse.    Pertinent History  patient is a 63 y/o female presenting with a left hand contracture which has been progressively getting worse for the past 10 years per patient. Dr. Luna Glasgow has referred patient to occupational therapy for a finger extension splint.    Patient Stated Goals  To receive a splint for her hand.    Currently in Pain?  No/denies   Pt reports increased pain when she is attempting to stretch her finger out. Nothing at rest.       [facility] OT Assessment - 06/14/19 1002      Assessment   Medical Diagnosis  Splint fabrication    Referring Provider (OT)  Dr. Sanjuana Kava    Onset Date/Surgical Date  --   ongoing for approximately 10 yrs   Hand Dominance  Right    Next MD Visit  unknown    Prior Therapy  None for this condition      Precautions   Precautions  None      Restrictions  Weight Bearing Restrictions  No      Balance Screen   Has the patient fallen in the past 6 months  No      Home  Environment   Family/patient expects to be discharged to:  Private residence      Prior Function   Level of Independence  Independent;Requires assistive device for independence    Vocation  Unemployed      ADL   ADL comments  Pt reports that she unable to grip items and hold onto them. Her hand tightens up at night. She is unable to braid her hair.       Mobility   Mobility Status  Independent      Written Expression   Dominant Hand  Right      Vision - History   Baseline Vision  No visual deficits      Cognition   Overall Cognitive Status  Within Functional Limits for tasks assessed      Observation/Other Assessments   Focus on Therapeutic Outcomes (FOTO)   N/A      Coordination   Gross Motor Movements are Fluid  and Coordinated  No    Fine Motor Movements are Fluid and Coordinated  No      ROM / Strength   AROM / PROM / Strength  AROM;PROM      AROM   Overall AROM   Deficits    Overall AROM Comments  Unable to fully extend 2-5th PIP and DIP joints of the left hand. 50% extension achieved. MCP joints with visable caved in appearance especially 3rd and 4th MCP joint.      PROM   Overall PROM Comments  Able to extend DIP and PIP joints almost to full extension passively.                OT Treatments/Exercises (OP) - 06/14/19 1014      Exercises   Exercises  Hand      Splinting   Splinting  Custom hand splint fabricated to allow finger extension of 2-5th digits. Able to tolerate slight wrist extension.             OT Education - 06/14/19 1017    Education Details  splint wear handout provided. Discussed wearing schedule, precautions, donning/doffing technique, and care management.    Person(s) Educated  Patient    Methods  Explanation;Demonstration;Handout    Comprehension  Verbalized understanding;Returned demonstration       OT Short Term Goals - 06/14/19 1029      OT SHORT TERM GOAL #1   Title  Patient will be provided with a fabricated hand splint and verbalize and demonstrate all precautions, wearing schedule, care management, and donning/dofifng technique in order to help decrease tone in her left hand and allow her to utilize it during daily tasks.    Time  1    Period  Days    Status  Achieved    Target Date  06/14/19               Plan - 06/14/19 1026    Clinical Impression Statement  A: Pt is a 63 y/o female with left hand contracture causing increased tone, pain with movement and use, and decreased coordination and functional use of her left hand resulting in difficulty completing daily tasks using her left hand as her non-dominant. Splint was fabricated with all education completed. All questions addressed. patient verbalized understanding of use and  education provided. Patient will follow up  if needed for any splint adjustments.    OT Occupational Profile and History  Problem Focused Assessment - Including review of records relating to presenting problem    Occupational performance deficits (Please refer to evaluation for details):  ADL's;IADL's;Rest and Sleep    Body Structure / Function / Physical Skills  ADL;UE functional use;Flexibility;Pain;FMC;ROM;GMC;Coordination;IADL;Tone;Mobility;Strength    Rehab Potential  Excellent    Clinical Decision Making  Limited treatment options, no task modification necessary    Comorbidities Affecting Occupational Performance:  May have comorbidities impacting occupational performance    Modification or Assistance to Complete Evaluation   No modification of tasks or assist necessary to complete eval    OT Frequency  One time visit    OT Treatment/Interventions  Splinting;Patient/family education    Plan  P: one time visit for splint fabrication. patient will follow up if needed for any adjustments.    Consulted and Agree with Plan of Care  Patient       Patient will benefit from skilled therapeutic intervention in order to improve the following deficits and impairments:   Body Structure / Function / Physical Skills: ADL, UE functional use, Flexibility, Pain, FMC, ROM, GMC, Coordination, IADL, Tone, Mobility, Strength       Visit Diagnosis: 1. Other symptoms and signs involving the musculoskeletal system   2. Stiffness of left hand, not elsewhere classified       Problem List Patient Active Problem List   Diagnosis Date Noted  . Status post total right knee replacement 11/24/2018  . Alcohol use 10/23/2018  . Polyarticular arthritis 10/07/2018  . Low back pain 03/31/2017  . Status post cervical spinal fusion 03/31/2017  . S/P left unicompartmental knee replacement 08/15/2015  . Anemia 08/06/2015  . Stage 3 chronic kidney disease ([place]) 08/06/2015  . Hepatitis C 03/02/2015  . Spondylosis,  cervical, with myelopathy 09/21/2013  . Quadriplegia ([place]) 09/21/2013  . Osteoarthritis of left knee 09/21/2013  . Hypokalemia 08/16/2013  . AKI (acute kidney injury) ([place]) 08/16/2013  . UTI (lower urinary tract infection) 08/07/2013  . Nausea & vomiting 08/07/2013  . Chronic kidney disease, stage IV (severe) ([place]) 08/07/2013  . Cervical myelopathy ([place]) 08/07/2013  . Osteoarthrosis, unspecified whether generalized or localized, involving lower leg 06/03/2013  . GERD 06/12/2009  . CHEST PAIN 06/12/2009  . LEG PAIN, BILATERAL 05/28/2009  . OBESITY 12/17/2007  . DYSPHAGIA UNSPECIFIED 12/03/2007  . ANEMIA, NORMOCYTIC 07/13/2007  . ALCOHOL ABUSE 07/13/2007  . RENAL DISEASE, CHRONIC, STAGE II 07/13/2007  . ANXIETY 06/29/2007  . DEPRESSION 06/29/2007  . Essential hypertension 06/29/2007  . CONSTIPATION 06/29/2007  . ARTHRITIS 06/29/2007  . MALAISE AND FATIGUE 06/29/2007   Ailene Ravel, OTR/L,CBIS  [id number]  06/14/2019, 10:32 AM  [facility] [address] [place], [place], [address] Phone: [phone]   Fax:  [phone]  Name: Colleen Nunez MRN: [id number] Date of Birth: Apr 13, 1956

## 2019-06-14 NOTE — Patient Instructions (Signed)
Your Splint This splint should initially be fitted by a healthcare practitioner.  The healthcare practitioner is responsible for providing wearing instructions and precautions to the patient, other healthcare practitioners and care provider involved in the patient's care.  This splint was custom made for you. Please read the following instructions to learn about wearing and caring for your splint.  Precautions Should your splint cause any of the following problems, remove the splint immediately and contact your therapist/physician.  Swelling  Severe Pain  Pressure Areas  Stiffness  Numbness  Do not wear your splint while operating machinery unless it has been fabricated for that purpose.  When To Wear Your Splint Where your splint according to your therapist/physician instructions. All the time  Care and Cleaning of Your Splint 1. Keep your splint away from open flames. 2. Your splint will lose its shape in temperatures over 135 degrees Farenheit, ( in car windows, near radiators, ovens or in hot water).  Never make any adjustments to your splint, if the splint needs adjusting remove it and make an appointment to see your therapist. 3. Your splint, including the cushion liner may be cleaned with soap and lukewarm water.  Do not immerse in hot water over 135 degrees Farenheit. 4. Straps may be washed with soap and water, but do not moisten the self-adhesive portion. 5. For ink or hard to remove spots use a scouring cleanser which contains chlorine.  Rinse the splint thoroughly after using chlorine cleanser.

## 2019-06-21 ENCOUNTER — Telehealth: Payer: Self-pay | Admitting: Orthopaedic Surgery

## 2019-06-21 NOTE — Telephone Encounter (Signed)
Patient called, voice message, and upon return call, relays that she has been having swelling around the sides of her feet, and that she also is running a fever.  Relayed to patient that we are unable to schedule an in-person appointment due to fever, during Covid-19, however, may schedule a telephone visit.  States she has an appointment with her primary care, Dr Legrand Rams, on 06/30/19. Discussed whether Dr Josephine Cables office is aware of her fever. Patient said she did not tell them about it, but will call back and let them know; will call back to our office.

## 2019-06-22 NOTE — Telephone Encounter (Signed)
Called back to follow up with patient as no response back. States still has a fever; therefore, aware cannot come to office for in-person visit; said she does have an appointment with Dr Legrand Rams 06/30/19; said she spoke to their office about the issue.

## 2019-06-26 ENCOUNTER — Emergency Department (HOSPITAL_COMMUNITY)
Admission: EM | Admit: 2019-06-26 | Discharge: 2019-06-26 | Disposition: A | Discharge status: Home / Self Care | Payer: Medicaid Other | Attending: Emergency Medicine | Admitting: Emergency Medicine

## 2019-06-26 ENCOUNTER — Emergency Department (HOSPITAL_COMMUNITY): Payer: Medicaid Other

## 2019-06-26 ENCOUNTER — Encounter (HOSPITAL_COMMUNITY): Payer: Self-pay | Admitting: Emergency Medicine

## 2019-06-26 ENCOUNTER — Other Ambulatory Visit: Payer: Self-pay

## 2019-06-26 DIAGNOSIS — N184 Chronic kidney disease, stage 4 (severe): Secondary | ICD-10-CM | POA: Insufficient documentation

## 2019-06-26 DIAGNOSIS — Z79899 Other long term (current) drug therapy: Secondary | ICD-10-CM | POA: Diagnosis not present

## 2019-06-26 DIAGNOSIS — Z7982 Long term (current) use of aspirin: Secondary | ICD-10-CM | POA: Diagnosis not present

## 2019-06-26 DIAGNOSIS — Z7722 Contact with and (suspected) exposure to environmental tobacco smoke (acute) (chronic): Secondary | ICD-10-CM | POA: Diagnosis not present

## 2019-06-26 DIAGNOSIS — Y9301 Activity, walking, marching and hiking: Secondary | ICD-10-CM | POA: Diagnosis not present

## 2019-06-26 DIAGNOSIS — Y999 Unspecified external cause status: Secondary | ICD-10-CM | POA: Insufficient documentation

## 2019-06-26 DIAGNOSIS — S9032XA Contusion of left foot, initial encounter: Secondary | ICD-10-CM | POA: Insufficient documentation

## 2019-06-26 DIAGNOSIS — I129 Hypertensive chronic kidney disease with stage 1 through stage 4 chronic kidney disease, or unspecified chronic kidney disease: Secondary | ICD-10-CM | POA: Diagnosis not present

## 2019-06-26 DIAGNOSIS — W2201XA Walked into wall, initial encounter: Secondary | ICD-10-CM | POA: Diagnosis not present

## 2019-06-26 DIAGNOSIS — Y929 Unspecified place or not applicable: Secondary | ICD-10-CM | POA: Insufficient documentation

## 2019-06-26 DIAGNOSIS — S99922A Unspecified injury of left foot, initial encounter: Secondary | ICD-10-CM | POA: Diagnosis present

## 2019-06-26 MED ORDER — SULFAMETHOXAZOLE-TRIMETHOPRIM 800-160 MG PO TABS: 1.0000 | ORAL_TABLET | Freq: Two times a day (BID) | ORAL | 0 refills | Status: AC

## 2019-06-26 MED ORDER — TRAMADOL HCL 50 MG PO TABS: 50.0000 mg | ORAL_TABLET | Freq: Four times a day (QID) | ORAL | 0 refills | Status: DC | PRN

## 2019-06-26 MED ORDER — HYDROCODONE-ACETAMINOPHEN 5-325 MG PO TABS
1.0000 | ORAL_TABLET | Freq: Once | ORAL | Status: AC
  Administered 2019-06-26: 16:00:00 1 via ORAL
  Filled 2019-06-26: qty 1

## 2019-06-26 NOTE — ED Notes (Signed)
TT in to discuss findings

## 2019-06-26 NOTE — ED Triage Notes (Signed)
Pt stubbed great LT toe a weeks and a half ago. A week ago, pt began having pain and swelling to entire foot to wear it is difficult to ambulate. Warmth noted to LT foot.

## 2019-06-26 NOTE — Discharge Instructions (Addendum)
Elevate your foot when possible.  Prop it up on pillows.  Take the antibiotic as directed.  Keep the foot wrapped for weight bearing, but not continuously.  Keep your appt with Dr. Legrand Rams this week

## 2019-06-26 NOTE — ED Notes (Signed)
Pt reports hitting foot several days ago   Now with pain and swelling to her L foot   Has appt with her physician Dr Legrand Rams on 8/13

## 2019-06-26 NOTE — ED Notes (Signed)
Out of bed to [facility] for BR

## 2019-06-26 NOTE — ED Notes (Signed)
Bulky Jones wrap to L foot

## 2019-06-26 NOTE — ED Notes (Signed)
To Rad 

## 2019-06-26 NOTE — ED Provider Notes (Signed)
[facility] EMERGENCY DEPARTMENT Provider Note   CSN: [id number] Arrival date & time: 06/26/19  1306     History   Chief Complaint Chief Complaint  Patient presents with  . Foot Problem    HPI Colleen Nunez is a 63 y.o. female.     HPI   Colleen Nunez is a 63 y.o. female with a history of chronic kidney disease, OA, and hypertension who presents to the Emergency Department complaining of left foot pain for nearly 2 weeks.  States she struck her toe on the door frame 2 weeks ago, since then she has continued to develop throbbing pain to her toe that now radiates to her dorsal foot and ankle.  She is noticed swelling, warmth, and redness to the dorsal foot.  She states her symptoms make it difficult for her to bear weight on her left foot.  She has been soaking her foot in warm water and Epson salt without relief.  She denies open wounds to her foot, numbness, or pain radiating proximal to the ankle.  She denies fall.    Past Medical History:  Diagnosis Date  . Anemia   . Anxiety   . Arthritis   . Chronic hip pain   . Chronic knee pain   . Depression    h/o suicide attempts in the past  . GERD (gastroesophageal reflux disease)   . Gout   . Hepatitis C antibody test positive       . Hypertension   . Polysubstance abuse ([place])    h/o  . Recurrent falls   . Renal insufficiency     Patient Active Problem List   Diagnosis Date Noted  . Status post total right knee replacement 11/24/2018  . Alcohol use 10/23/2018  . Polyarticular arthritis 10/07/2018  . Low back pain 03/31/2017  . Status post cervical spinal fusion 03/31/2017  . S/P left unicompartmental knee replacement 08/15/2015  . Anemia 08/06/2015  . Stage 3 chronic kidney disease ([place]) 08/06/2015  . Hepatitis C 03/02/2015  . Spondylosis, cervical, with myelopathy 09/21/2013  . Quadriplegia ([place]) 09/21/2013  . Osteoarthritis of left knee 09/21/2013  . Hypokalemia 08/16/2013  . AKI (acute kidney injury)  ([place]) 08/16/2013  . UTI (lower urinary tract infection) 08/07/2013  . Nausea & vomiting 08/07/2013  . Chronic kidney disease, stage IV (severe) ([place]) 08/07/2013  . Cervical myelopathy ([place]) 08/07/2013  . Osteoarthrosis, unspecified whether generalized or localized, involving lower leg 06/03/2013  . GERD 06/12/2009  . CHEST PAIN 06/12/2009  . LEG PAIN, BILATERAL 05/28/2009  . OBESITY 12/17/2007  . DYSPHAGIA UNSPECIFIED 12/03/2007  . ANEMIA, NORMOCYTIC 07/13/2007  . ALCOHOL ABUSE 07/13/2007  . RENAL DISEASE, CHRONIC, STAGE II 07/13/2007  . ANXIETY 06/29/2007  . DEPRESSION 06/29/2007  . Essential hypertension 06/29/2007  . CONSTIPATION 06/29/2007  . ARTHRITIS 06/29/2007  . MALAISE AND FATIGUE 06/29/2007    Past Surgical History:  Procedure Laterality Date  . ABDOMINAL HYSTERECTOMY    . ANTERIOR CERVICAL CORPECTOMY N/A 06/09/2013   Procedure: C4 C5 Corpectomy with C6-7 Anterior cervical fusion/Peek cage/Trestle plate;  Surgeon: Otilio Connors, MD;  Location: [place] NEURO ORS;  Service: Neurosurgery;  Laterality: N/A;  Cervical Four, Cervical Five Corpectomy and Cervical six-seven Anterior cervical fusion/Peek cage Three-Five /Trestle Plating Cervical Three to Cervical Seven  . ANTERIOR CERVICAL DECOMP/DISCECTOMY FUSION N/A 08/12/2013   Procedure: Repair of Anterior  Cervical CSF LEAK, lumbar drain placement.;  Surgeon: Floyce Stakes, MD;  Location: [place];  Service: Neurosurgery;  Laterality: N/A;  . bilateral SOO and appendectomy  2006  . cartilage removal     left knee  . COLONOSCOPY N/A 07/04/2015   Procedure: COLONOSCOPY;  Surgeon: Rogene Houston, MD;  [facility];  Service: Endoscopy;  Laterality: N/A;  100  . KNEE ARTHROSCOPY    . X-STOP IMPLANTATION       OB History   No obstetric history on file.      Home Medications    Prior to Admission medications   Medication Sig Start Date End Date Taking? Authorizing Provider  acetaminophen (TYLENOL) 500 MG tablet  Take 500 mg by mouth every 6 (six) hours as needed for mild pain or moderate pain.    [provider]  amLODipine (NORVASC) 10 MG tablet Take 0.5 tablets (5 mg total) by mouth daily. Patient taking differently: Take 10 mg by mouth daily.  09/02/13   LoveIvan Anchors, PA-C  aspirin 325 MG EC tablet Take 325 mg by mouth daily.    [provider]  calcium-vitamin D (OSCAL WITH D) 500-200 MG-UNIT tablet Take 1 tablet by mouth.    [provider]  DULoxetine (CYMBALTA) 20 MG capsule Take 20 mg by mouth daily.    [provider]  gabapentin (NEURONTIN) 300 MG capsule Take 300 mg by mouth 2 (two) times daily as needed (for pain).    [provider]  HYDROcodone-acetaminophen (NORCO/VICODIN) 5-325 MG tablet Take 1 tablet by mouth 2 (two) times daily as needed for moderate pain.  01/16/16   [provider]  hydrOXYzine (ATARAX/VISTARIL) 25 MG tablet Take 25 mg by mouth 3 (three) times daily as needed.    [provider]  iron polysaccharides (NIFEREX) 150 MG capsule Take 150 mg by mouth daily.    [provider]  naloxegol oxalate (MOVANTIK) 25 MG TABS tablet Take by mouth daily.    [provider]  naproxen (NAPROSYN) 500 MG tablet Take 1 tablet (500 mg total) by mouth 2 (two) times daily. 05/27/18   Evalee Jefferson, PA-C  potassium chloride (K-DUR) 10 MEQ tablet Take 10 mEq by mouth daily.    [provider]  senna (SENOKOT) 8.6 MG tablet Take 1 tablet by mouth daily.    [provider]  sulfamethoxazole-trimethoprim (BACTRIM DS) 800-160 MG tablet Take 1 tablet by mouth 2 (two) times daily for 7 days. 06/26/19 07/03/19  Zaylan Kissoon, PA-C  traMADol (ULTRAM) 50 MG tablet Take 1 tablet (50 mg total) by mouth every 6 (six) hours as needed. 06/26/19   Kem Parkinson, PA-C    Family History Family History  Problem Relation Age of Onset  . Diabetes Sister   . Diabetes Mother        deceased age 20  . Kidney failure  Mother   . Alcohol abuse Father   . Colon cancer Neg Hx     Social History Social History   Tobacco Use  . Smoking status: Passive Smoke Exposure - Never Smoker  . Smokeless tobacco: Never Used  Substance Use Topics  . Alcohol use: Yes    Alcohol/week: 3.0 standard drinks    Types: 3 Cans of beer per week    Comment: occasionally  . Drug use: Not Currently    Comment: h/o crack cocaine in past. Clean since 2006     Allergies   Aspirin and Oxycodone   Review of Systems Review of Systems  Constitutional: Negative for chills and fever.  Gastrointestinal: Negative for nausea and vomiting.  Musculoskeletal: Positive for arthralgias (Pain and swelling to the left foot and ankle). Negative for joint swelling.  Skin: Positive for color change. Negative for wound.  Neurological: Negative for weakness and numbness.     Physical Exam Updated Vital Signs BP 108/66 (BP Location: Right Arm)   Pulse 82   Temp 99.3 F (37.4 C) (Oral)   Resp 16   Ht 5\' 2"  (1.575 m)   Wt 77.1 kg   SpO2 96%   BMI 31.09 kg/m   Physical Exam Vitals signs and nursing note reviewed.  Constitutional:      General: She is not in acute distress.    Appearance: Normal appearance. She is not toxic-appearing.  HENT:     Head: Atraumatic.  Cardiovascular:     Rate and Rhythm: Normal rate and regular rhythm.     Pulses: Normal pulses.  Pulmonary:     Effort: Pulmonary effort is normal.  Chest:     Chest wall: No tenderness.  Musculoskeletal:        General: Swelling, tenderness and signs of injury present.     Comments: Tenderness to palpation of the left great toe.  No avulsion of the left great toenail, no subungual hematoma.  Tenderness also to palpation of the dorsal left foot with mild to moderate edema noted.  No bony deformities.  Dorsalis pedis and posterior tibial pulses palpable and equal bilaterally.  Compartments are soft.  Skin:    General: Skin is warm.     Capillary Refill:  Capillary refill takes less than 2 seconds.     Findings: Erythema present.     Comments: Mild erythema and warmth noted to the dorsal left foot.  No obvious open wounds to the webspaces of the toes or plantar surface of the foot.  No lymphangitis.  Neurological:     General: No focal deficit present.     Mental Status: She is alert.     Sensory: No sensory deficit.     Motor: No weakness.      ED Treatments / Results  Labs (all labs ordered are listed, but only abnormal results are displayed) Labs Reviewed - No data to display  EKG None  Radiology Dg Ankle Complete Left  Result Date: 06/26/2019 CLINICAL DATA:  Blunt injury to the left great toe approximately 10 days ago with increasing pain and swelling. EXAM: LEFT ANKLE COMPLETE - 3+ VIEW COMPARISON:  None. FINDINGS: There is no evidence of fracture, dislocation, or joint effusion. There is no evidence of arthropathy or other focal bone abnormality. Diffuse soft tissue swelling about the ankle. IMPRESSION: 1. No acute fracture or dislocation identified about the left ankle. 2. Diffuse soft tissue swelling about the ankle. Electronically Signed   By: Fidela Salisbury M.D.   On: 06/26/2019 14:41   Dg Foot Complete Left  Result Date: 06/26/2019 CLINICAL DATA:  Post blunt injury to the left great toe with increasing pain and swelling. EXAM: LEFT FOOT - COMPLETE 3+ VIEW COMPARISON:  None. FINDINGS: There is no evidence of fracture or dislocation. There is no evidence of arthropathy or other focal bone abnormality. Diffuse soft tissue swelling. IMPRESSION: 1. No acute fracture or dislocation identified about the left foot. 2. Diffuse soft tissue swelling. Electronically Signed   By: Fidela Salisbury M.D.   On: 06/26/2019 14:42    Procedures Procedures (including critical care time)  Medications Ordered in ED Medications  HYDROcodone-acetaminophen (NORCO/VICODIN) 5-325 MG per tablet 1 tablet (1 tablet Oral Given 06/26/19 1604)  Initial Impression / Assessment and Plan / ED Course  I have reviewed the triage vital signs and the nursing notes.  Pertinent labs & imaging results that were available during my care of the patient were reviewed by me and considered in my medical decision making (see chart for details).        Patient with likely contusion of the left foot.  Neurovascularly intact.  No obvious open wounds, but redness and warmth is noted.  Given concern for possible early cellulitis, I will start patient on antibiotic.  Bulky dressing applied to left foot for support.  Patient agrees to elevate when possible.  She has a follow-up appointment with her PCP for 06/30/2019.  Strict return precautions were discussed and patient agrees to plan.  Final Clinical Impressions(s) / ED Diagnoses   Final diagnoses:  Contusion of left foot, initial encounter    ED Discharge Orders         Ordered    sulfamethoxazole-trimethoprim (BACTRIM DS) 800-160 MG tablet  2 times daily     06/26/19 1554    traMADol (ULTRAM) 50 MG tablet  Every 6 hours PRN     06/26/19 [place], [place], PA-C 06/27/19 [place], [place], DO 06/30/19 1536

## 2019-06-26 NOTE — ED Notes (Signed)
TT in to assess 

## 2019-06-26 NOTE — ED Notes (Signed)
From Rad 

## 2019-07-07 ENCOUNTER — Encounter: Payer: Self-pay | Admitting: Orthopaedic Surgery

## 2019-07-07 ENCOUNTER — Other Ambulatory Visit: Payer: Self-pay

## 2019-07-07 ENCOUNTER — Ambulatory Visit: Payer: Medicaid Other | Admitting: Orthopaedic Surgery

## 2019-07-07 VITALS — BP 150/89 | HR 85 | Temp 98.6°F | Ht 62.0 in | Wt 170.0 lb

## 2019-07-07 DIAGNOSIS — S96912A Strain of unspecified muscle and tendon at ankle and foot level, left foot, initial encounter: Secondary | ICD-10-CM | POA: Diagnosis not present

## 2019-07-07 NOTE — Progress Notes (Signed)
Patient Colleen Nunez, female DOB:20-Sep-1956, 63 y.o. GYI:[id number]  Chief Complaint  Patient presents with  . Foot Pain    Lef tfoot pain.    HPI  Colleen Nunez is a 63 y.o. female who twisted her left ankle just over 2 weeks ago. She was seen in the ER on 06-26-2019 and had x-rays which were negative. She was given a wrap and told to elevate the foot.  She has done so but the foot still hurts laterally and the lateral left ankle. She has no redness or other injury.  I have reviewed her notes and x-rays from 06-26-2019 and notes fromDr. F[place] 06-30-2019.   Body mass index is 31.09 kg/m.  ROS  Review of Systems  Constitutional:       Patient does not have Diabetes Mellitus. Patient has hypertension. Patient has COPD or shortness of breath. Patient does not have BMI > 35. Patient does not have current smoking history.  HENT: Negative for congestion.   Respiratory: Negative for cough and shortness of breath.   Cardiovascular: Negative for chest pain.  Gastrointestinal:       GERD   Endocrine: Positive for cold intolerance.  Musculoskeletal: Positive for arthralgias, gait problem, joint swelling and myalgias.  Allergic/Immunologic: Negative for environmental allergies.  All other systems reviewed and are negative.   All other systems reviewed and are negative.  The following is a summary of the past history medically, past history surgically, known current medicines, social history and family history.  This information is gathered electronically by the computer from prior information and documentation.  I review this each visit and have found including this information at this point in the chart is beneficial and informative.    Past Medical History:  Diagnosis Date  . Anemia   . Anxiety   . Arthritis   . Chronic hip pain   . Chronic knee pain   . Depression    h/o suicide attempts in the past  . GERD (gastroesophageal reflux disease)   . Gout   . Hepatitis C  antibody test positive       . Hypertension   . Polysubstance abuse ([place])    h/o  . Recurrent falls   . Renal insufficiency     Past Surgical History:  Procedure Laterality Date  . ABDOMINAL HYSTERECTOMY    . ANTERIOR CERVICAL CORPECTOMY N/A 06/09/2013   Procedure: C4 C5 Corpectomy with C6-7 Anterior cervical fusion/Peek cage/Trestle plate;  Surgeon: Otilio Connors, MD;  Location: [place] NEURO ORS;  Service: Neurosurgery;  Laterality: N/A;  Cervical Four, Cervical Five Corpectomy and Cervical six-seven Anterior cervical fusion/Peek cage Three-Five /Trestle Plating Cervical Three to Cervical Seven  . ANTERIOR CERVICAL DECOMP/DISCECTOMY FUSION N/A 08/12/2013   Procedure: Repair of Anterior  Cervical CSF LEAK, lumbar drain placement.;  Surgeon: Floyce Stakes, MD;  Location: [place];  Service: Neurosurgery;  Laterality: N/A;  . bilateral SOO and appendectomy  2006  . cartilage removal     left knee  . COLONOSCOPY N/A 07/04/2015   Procedure: COLONOSCOPY;  Surgeon: Rogene Houston, MD;  [facility];  Service: Endoscopy;  Laterality: N/A;  100  . KNEE ARTHROSCOPY    . X-STOP IMPLANTATION      Family History  Problem Relation Age of Onset  . Diabetes Sister   . Diabetes Mother        deceased age 4  . Kidney failure Mother   . Alcohol abuse Father   . Colon  cancer Neg Hx     Social History Social History   Tobacco Use  . Smoking status: Passive Smoke Exposure - Never Smoker  . Smokeless tobacco: Never Used  Substance Use Topics  . Alcohol use: Yes    Alcohol/week: 3.0 standard drinks    Types: 3 Cans of beer per week    Comment: occasionally  . Drug use: Not Currently    Comment: h/o crack cocaine in past. Clean since 2006    Allergies  Allergen Reactions  . Aspirin     REACTION: Hx of overdose on this years ago - too scared to take. Tried to committ suicide.  . Oxycodone Nausea And Vomiting    Current Outpatient Medications  Medication Sig Dispense Refill  .  acetaminophen (TYLENOL) 500 MG tablet Take 500 mg by mouth every 6 (six) hours as needed for mild pain or moderate pain.    Marland Kitchen amLODipine (NORVASC) 10 MG tablet Take 0.5 tablets (5 mg total) by mouth daily. (Patient taking differently: Take 10 mg by mouth daily. )    . aspirin 325 MG EC tablet Take 325 mg by mouth daily.    . calcium-vitamin D (OSCAL WITH D) 500-200 MG-UNIT tablet Take 1 tablet by mouth.    . DULoxetine (CYMBALTA) 20 MG capsule Take 20 mg by mouth daily.    Marland Kitchen gabapentin (NEURONTIN) 300 MG capsule Take 300 mg by mouth 2 (two) times daily as needed (for pain).    Marland Kitchen HYDROcodone-acetaminophen (NORCO/VICODIN) 5-325 MG tablet Take 1 tablet by mouth 2 (two) times daily as needed for moderate pain.     . hydrOXYzine (ATARAX/VISTARIL) 25 MG tablet Take 25 mg by mouth 3 (three) times daily as needed.    . iron polysaccharides (NIFEREX) 150 MG capsule Take 150 mg by mouth daily.    . naloxegol oxalate (MOVANTIK) 25 MG TABS tablet Take by mouth daily.    . naproxen (NAPROSYN) 500 MG tablet Take 1 tablet (500 mg total) by mouth 2 (two) times daily. 20 tablet 0  . potassium chloride (K-DUR) 10 MEQ tablet Take 10 mEq by mouth daily.    Marland Kitchen senna (SENOKOT) 8.6 MG tablet Take 1 tablet by mouth daily.    . traMADol (ULTRAM) 50 MG tablet Take 1 tablet (50 mg total) by mouth every 6 (six) hours as needed. 10 tablet 0   No current facility-administered medications for this visit.      Physical Exam  Blood pressure (!) 150/89, pulse 85, temperature 98.6 F (37 C), height 5\' 2"  (1.575 m), weight 170 lb (77.1 kg).  Constitutional: overall normal hygiene, normal nutrition, well developed, normal grooming, normal body habitus. Assistive device:ace left ankle  Musculoskeletal: gait and station Limp left, muscle tone and strength are normal, no tremors or atrophy is present.  .  Neurological: coordination overall normal.  Deep tendon reflex/nerve stretch intact.  Sensation normal.  Cranial nerves  II-XII intact.   Skin:   Normal overall no scars, lesions, ulcers or rashes. No psoriasis.  Psychiatric: Alert and oriented x 3.  Recent memory intact, remote memory unclear.  Normal mood and affect. Well groomed.  Good eye contact.  Cardiovascular: overall no swelling, no varicosities, no edema bilaterally, normal temperatures of the legs and arms, no clubbing, cyanosis and good capillary refill.  Lymphatic: palpation is normal.  Left ankle with swelling laterally, pain lateral anterior talofibular ligament.  She has no ecchymosis.  ROM is tender.  NV intact.  All other systems reviewed and are  negative   The patient has been educated about the nature of the problem(s) and counseled on treatment options.  The patient appeared to understand what I have discussed and is in agreement with it.  Encounter Diagnosis  Name Primary?  . Strain of left ankle, initial encounter Yes    PLAN Call if any problems.  Precautions discussed.  Continue current medications.   Return to clinic 2 weeks   She was given ankle brace.  Contrast bath instructions given.  Electronically Signed Sanjuana Kava, MD 8/20/202010:22 AM

## 2019-07-21 ENCOUNTER — Ambulatory Visit (INDEPENDENT_AMBULATORY_CARE_PROVIDER_SITE_OTHER): Payer: Medicaid Other | Admitting: Orthopaedic Surgery

## 2019-07-21 ENCOUNTER — Other Ambulatory Visit: Payer: Self-pay

## 2019-07-21 ENCOUNTER — Encounter: Payer: Self-pay | Admitting: Orthopaedic Surgery

## 2019-07-21 VITALS — BP 103/75 | HR 81 | Ht 62.0 in | Wt 170.0 lb

## 2019-07-21 DIAGNOSIS — M7062 Trochanteric bursitis, left hip: Secondary | ICD-10-CM

## 2019-07-21 NOTE — Progress Notes (Signed)
PROCEDURE NOTE:  The patient request injection, verbal consent was obtained.  The left trochanteric area of the hip was prepped appropriately after time out was performed.   Sterile technique was observed and injection of 1 cc of Depo-Medrol 40 mg with several cc's of plain xylocaine. Anesthesia was provided by ethyl chloride and a 20-gauge needle was used to inject the hip area. The injection was tolerated well.  A band aid dressing was applied.  The patient was advised to apply ice later today and tomorrow to the injection sight as needed.  Call if any problem.  Precautions discussed.   Electronically Signed Sanjuana Kava, MD 9/3/202011:39 AM

## 2019-08-04 ENCOUNTER — Ambulatory Visit: Payer: Medicaid Other | Admitting: Orthopaedic Surgery

## 2019-08-23 ENCOUNTER — Ambulatory Visit (INDEPENDENT_AMBULATORY_CARE_PROVIDER_SITE_OTHER): Payer: Medicaid Other | Admitting: Internal Medicine

## 2019-08-30 ENCOUNTER — Ambulatory Visit: Payer: Medicaid Other | Admitting: Orthopaedic Surgery

## 2019-09-01 ENCOUNTER — Encounter: Payer: Self-pay | Admitting: Orthopaedic Surgery

## 2019-09-01 ENCOUNTER — Ambulatory Visit: Payer: Medicaid Other | Admitting: Orthopaedic Surgery

## 2019-09-01 ENCOUNTER — Other Ambulatory Visit: Payer: Self-pay

## 2019-09-01 DIAGNOSIS — M7062 Trochanteric bursitis, left hip: Secondary | ICD-10-CM | POA: Diagnosis not present

## 2019-09-01 DIAGNOSIS — B351 Tinea unguium: Secondary | ICD-10-CM

## 2019-09-01 NOTE — Progress Notes (Signed)
PROCEDURE NOTE:  The patient request injection, verbal consent was obtained.  The left trochanteric area of the hip was prepped appropriately after time out was performed.   Sterile technique was observed and injection of 1 cc of Depo-Medrol 40 mg with several cc's of plain xylocaine. Anesthesia was provided by ethyl chloride and a 20-gauge needle was used to inject the hip area. The injection was tolerated well.  A band aid dressing was applied.  The patient was advised to apply ice later today and tomorrow to the injection sight as needed.  I pared her nails while she was here.  Return in three months.  Electronically Signed Sanjuana Kava, MD 10/15/20208:59 AM

## 2019-11-08 ENCOUNTER — Ambulatory Visit: Payer: Medicaid Other | Admitting: Orthopaedic Surgery

## 2019-11-08 ENCOUNTER — Other Ambulatory Visit: Payer: Self-pay

## 2019-11-08 ENCOUNTER — Encounter: Payer: Self-pay | Admitting: Orthopaedic Surgery

## 2019-11-08 DIAGNOSIS — M7062 Trochanteric bursitis, left hip: Secondary | ICD-10-CM | POA: Diagnosis not present

## 2019-11-08 NOTE — Progress Notes (Signed)
PROCEDURE NOTE:  The patient request injection, verbal consent was obtained.  The left trochanteric area of the hip was prepped appropriately after time out was performed.   Sterile technique was observed and injection of 1 cc of Depo-Medrol 40 mg with several cc's of plain xylocaine. Anesthesia was provided by ethyl chloride and a 20-gauge needle was used to inject the hip area. The injection was tolerated well.  A band aid dressing was applied.  The patient was advised to apply ice later today and tomorrow to the injection sight as needed.  Electronically Signed Sanjuana Kava, MD 12/22/20202:54 PM

## 2019-12-01 ENCOUNTER — Ambulatory Visit: Payer: Medicaid Other | Admitting: Orthopaedic Surgery

## 2020-01-21 DIAGNOSIS — M542 Cervicalgia: Secondary | ICD-10-CM | POA: Insufficient documentation

## 2020-01-21 DIAGNOSIS — Z79891 Long term (current) use of opiate analgesic: Secondary | ICD-10-CM | POA: Insufficient documentation

## 2020-01-21 DIAGNOSIS — M25569 Pain in unspecified knee: Secondary | ICD-10-CM | POA: Insufficient documentation

## 2020-02-07 ENCOUNTER — Other Ambulatory Visit: Payer: Self-pay

## 2020-02-07 ENCOUNTER — Encounter: Payer: Self-pay | Admitting: Orthopaedic Surgery

## 2020-02-07 ENCOUNTER — Ambulatory Visit: Payer: Medicaid Other | Admitting: Orthopaedic Surgery

## 2020-02-07 DIAGNOSIS — M7062 Trochanteric bursitis, left hip: Secondary | ICD-10-CM | POA: Diagnosis not present

## 2020-02-07 DIAGNOSIS — B351 Tinea unguium: Secondary | ICD-10-CM

## 2020-02-07 NOTE — Progress Notes (Signed)
PROCEDURE NOTE:  The patient request injection, verbal consent was obtained.  The left trochanteric area of the hip was prepped appropriately after time out was performed.   Sterile technique was observed and injection of 1 cc of Depo-Medrol 40 mg with several cc's of plain xylocaine. Anesthesia was provided by ethyl chloride and a 20-gauge needle was used to inject the hip area. The injection was tolerated well.  A band aid dressing was applied.  The patient was advised to apply ice later today and tomorrow to the injection sight as needed.  She has toenail fungus.  I pared the foot toe nails while she was here also.  Return as needed.  Electronically Signed Sanjuana Kava, MD 3/23/20219:02 AM

## 2020-02-17 ENCOUNTER — Ambulatory Visit: Payer: Medicaid Other

## 2020-05-15 ENCOUNTER — Ambulatory Visit: Payer: Medicaid Other | Admitting: Orthopaedic Surgery

## 2020-05-17 ENCOUNTER — Other Ambulatory Visit: Payer: Self-pay

## 2020-05-17 ENCOUNTER — Ambulatory Visit: Payer: Medicaid Other | Admitting: Orthopaedic Surgery

## 2020-05-17 ENCOUNTER — Encounter: Payer: Self-pay | Admitting: Orthopaedic Surgery

## 2020-05-17 DIAGNOSIS — M7062 Trochanteric bursitis, left hip: Secondary | ICD-10-CM

## 2020-05-17 DIAGNOSIS — B351 Tinea unguium: Secondary | ICD-10-CM

## 2020-05-17 NOTE — Progress Notes (Signed)
PROCEDURE NOTE:  The patient request injection, verbal consent was obtained.  The left trochanteric area of the hip was prepped appropriately after time out was performed.   Sterile technique was observed and injection of 1 cc of Depo-Medrol 40 mg with several cc's of plain xylocaine. Anesthesia was provided by ethyl chloride and a 20-gauge needle was used to inject the hip area. The injection was tolerated well.  A band aid dressing was applied.  The patient was advised to apply ice later today and tomorrow to the injection sight as needed.  I pared her nails of the feet while she was here.  Return as needed.  Call if any problem.  Precautions discussed.   Electronically Signed Sanjuana Kava, MD 7/1/20219:53 AM

## 2020-05-28 ENCOUNTER — Telehealth: Payer: Self-pay | Admitting: Orthopaedic Surgery

## 2020-05-28 MED ORDER — HYDROCODONE-ACETAMINOPHEN 5-325 MG PO TABS: 1.0000 | ORAL_TABLET | Freq: Two times a day (BID) | ORAL | 0 refills | Status: DC | PRN

## 2020-05-28 NOTE — Telephone Encounter (Signed)
Colleen Nunez called and left a message stating she needs something for pain sent in to [facility]

## 2020-07-31 ENCOUNTER — Telehealth: Payer: Self-pay | Admitting: Orthopaedic Surgery

## 2020-07-31 NOTE — Telephone Encounter (Signed)
Patient called regarding possible appointment - states has new insurance. Reviewed coverage - patient is currently, as of 05/17/20, covered under one of the Medicaid plans with which [facility] is out of network. States as of 09/17/20, she will be covered under Medicare as primary. Aware to contact department of social services regarding her Medicaid plan, in event she may still have the option to change plan to one of the in-network plans -gave patient names of in network Medicare plans for [facility]. Voiced understanding.

## 2020-09-18 ENCOUNTER — Ambulatory Visit (INDEPENDENT_AMBULATORY_CARE_PROVIDER_SITE_OTHER): Payer: Medicare Other | Admitting: Orthopaedic Surgery

## 2020-09-18 ENCOUNTER — Encounter: Payer: Self-pay | Admitting: Orthopaedic Surgery

## 2020-09-18 ENCOUNTER — Other Ambulatory Visit: Payer: Self-pay

## 2020-09-18 VITALS — BP 120/78 | HR 75 | Ht 62.0 in | Wt 177.0 lb

## 2020-09-18 DIAGNOSIS — M7062 Trochanteric bursitis, left hip: Secondary | ICD-10-CM

## 2020-09-18 NOTE — Progress Notes (Signed)
PROCEDURE NOTE:  The patient request injection, verbal consent was obtained.  The left trochanteric area of the hip was prepped appropriately after time out was performed.   Sterile technique was observed and injection of 1 cc of Depo-Medrol 40 mg with several cc's of plain xylocaine. Anesthesia was provided by ethyl chloride and a 20-gauge needle was used to inject the hip area. The injection was tolerated well.  A band aid dressing was applied.  The patient was advised to apply ice later today and tomorrow to the injection sight as needed.  I also pared her toe nails.  Return in three months.  Electronically Signed Sanjuana Kava, MD 11/2/20218:45 AM

## 2020-11-19 ENCOUNTER — Encounter (HOSPITAL_COMMUNITY): Payer: Self-pay

## 2020-11-19 ENCOUNTER — Other Ambulatory Visit: Payer: Self-pay

## 2020-11-19 ENCOUNTER — Ambulatory Visit (HOSPITAL_COMMUNITY): Payer: Medicare Other | Attending: Orthopedic Surgery

## 2020-11-19 DIAGNOSIS — G8929 Other chronic pain: Secondary | ICD-10-CM | POA: Diagnosis present

## 2020-11-19 DIAGNOSIS — R262 Difficulty in walking, not elsewhere classified: Secondary | ICD-10-CM | POA: Insufficient documentation

## 2020-11-19 DIAGNOSIS — M6281 Muscle weakness (generalized): Secondary | ICD-10-CM | POA: Insufficient documentation

## 2020-11-19 DIAGNOSIS — M25561 Pain in right knee: Secondary | ICD-10-CM | POA: Insufficient documentation

## 2020-11-19 DIAGNOSIS — M25661 Stiffness of right knee, not elsewhere classified: Secondary | ICD-10-CM | POA: Insufficient documentation

## 2020-11-19 NOTE — Patient Instructions (Signed)
Access Code: [email or web address] URL: [email or web address] Date: 11/19/2020 Prepared by: Sherlyn Lees  Exercises Supine Quad Set - 1 x daily - 7 x weekly - 3 sets - 10 reps - 2 hold Active Straight Leg Raise with Quad Set - 1 x daily - 7 x weekly - 3 sets - 10 reps - 2 hold Supine Hip Adduction Isometric with Ball - 1 x daily - 7 x weekly - 3 sets - 10 reps - 2 hold

## 2020-11-19 NOTE — Therapy (Signed)
[place], [place], [address] Phone: [phone]   Fax:  [phone]  Physical Therapy Evaluation  Patient Details  Name: Colleen Nunez MRN: [id number] Date of Birth: [birth year] Referring Provider (PT): Dr. Magda Bernheim, MD   Encounter Date: 11/19/2020   PT End of Session - 11/19/20 1028    Visit Number 1    Number of Visits 12    Date for PT Re-Evaluation 12/31/20    Authorization Type Medicare B 80/20    PT Start Time 1000    PT Stop Time 1045    PT Time Calculation (min) 45 min    Activity Tolerance Patient tolerated treatment well;Patient limited by fatigue    Behavior During Therapy [id number] for tasks assessed/performed           Past Medical History:  Diagnosis Date  . Anemia   . Anxiety   . Arthritis   . Chronic hip pain   . Chronic knee pain   . Depression    h/o suicide attempts in the past  . GERD (gastroesophageal reflux disease)   . Gout   . Hepatitis C antibody test positive       . Hypertension   . Polysubstance abuse ([place])    h/o  . Recurrent falls   . Renal insufficiency     Past Surgical History:  Procedure Laterality Date  . ABDOMINAL HYSTERECTOMY    . ANTERIOR CERVICAL CORPECTOMY N/A 06/09/2013   Procedure: C4 C5 Corpectomy with C6-7 Anterior cervical fusion/Peek cage/Trestle plate;  Surgeon: Otilio Connors, MD;  Location: [place] [facility];  Service: Neurosurgery;  Laterality: N/A;  Cervical Four, Cervical Five Corpectomy and Cervical six-seven Anterior cervical fusion/Peek cage Three-Five /Trestle Plating Cervical Three to Cervical Seven  . ANTERIOR CERVICAL DECOMP/DISCECTOMY FUSION N/A 08/12/2013   Procedure: Repair of Anterior  Cervical CSF LEAK, lumbar drain placement.;  Surgeon: Floyce Stakes, MD;  Location: [facility];  Service: Neurosurgery;  Laterality: N/A;  . bilateral SOO and appendectomy  2006  . cartilage removal     left knee  . COLONOSCOPY N/A 07/04/2015   Procedure:  COLONOSCOPY;  Surgeon: Rogene Houston, MD;  [facility];  Service: Endoscopy;  Laterality: N/A;  100  . KNEE ARTHROSCOPY    . X-STOP IMPLANTATION      There were no vitals filed for this visit.    Subjective Assessment - 11/19/20 1005    Subjective Patient reports her right knee has been bothering her with increased frequency and intensity. "Feels like it wants to lock up on me".  Patient denies any single injury or incident that precipitated her pain. Patient reports use of single crutch or RW for ambulation. Patient also has had flexion contractures bilaterally    Currently in Pain? Yes    Pain Score 6     Pain Location Knee    Pain Orientation Right    Pain Descriptors / Indicators Aching    Pain Type Chronic pain    Pain Onset More than a month ago    Pain Frequency Intermittent              [facility] PT Assessment - 11/19/20 0001      Assessment   Medical Diagnosis Right knee pain/TKR    Referring Provider (PT) Dr. Magda Bernheim, MD    Onset Date/Surgical Date 10/04/18    Prior Therapy after her right TKR in 2019      Balance  Screen   Has the patient fallen in the past 6 months No    Has the patient had a decrease in activity level because of a fear of falling?  No    Is the patient reluctant to leave their home because of a fear of falling?  No      Home Environment   Living Environment Private residence    Living Arrangements Spouse/significant other    Type of [place] to enter    Entrance Stairs-Number of Steps 2    [place] One level    [place] Crutches;Walker - 2 wheels      Prior Function   Level of Independence Independent with household mobility with device      Observation/Other Assessments   Focus on Therapeutic Outcomes (FOTO)  41% function      ROM / Strength   AROM / PROM / Strength AROM;PROM;Strength      AROM   AROM Assessment Site Knee    Right/Left Knee Right;Left    Right Knee Extension 0     Right Knee Flexion 105    Left Knee Extension 0    Left Knee Flexion 115      PROM   PROM Assessment Site Knee    Right/Left Knee Right;Left      Strength   Strength Assessment Site Knee;Hip    Right/Left Hip Right;Left    Right Hip Flexion 3/5    Right Hip Extension 2+/5    Right Hip ABduction 2+/5    Right Hip ADduction 3-/5    Left Hip Flexion 3/5    Left Hip Extension 2+/5    Left Hip ABduction 2+/5    Left Hip ADduction 3-/5    Right/Left Knee Right;Left    Right Knee Flexion 3+/5    Right Knee Extension 3+/5    Left Knee Flexion 4/5    Left Knee Extension 4/5      Ambulation/Gait   Ambulation/Gait Yes    Ambulation/Gait Assistance 6: Modified independent (Device/Increase time)    Ambulation Distance (Feet) 110 Feet    Assistive device L Axillary Crutch    Gait Pattern Step-through pattern;Decreased stride length;Antalgic    Ambulation Surface Level    Gait velocity decreased    Stairs Yes    Stairs Assistance 5: Supervision    Stair Management Technique One rail Left;With crutches    Number of Stairs 2    Gait Comments 2MWT                      Objective measurements completed on examination: See above findings.               PT Education - 11/19/20 1027    Education Details pt educated on importance of HEP and benefits of strengthening for RLE dysfunction    Person(s) Educated Patient    Methods Explanation;Demonstration    Comprehension Verbalized understanding;Returned demonstration            PT Short Term Goals - 11/19/20 1047      PT SHORT TERM GOAL #1   Title Patient will report at least 25% improvement in symptoms for improved quality of life.    Time 3    Period Weeks    Status New    Target Date 12/10/20      PT SHORT TERM GOAL #2   Title Patient will be independent with HEP in order to improve  functional outcomes.    Baseline in development    Time 3    Period Weeks    Status New    Target Date 12/10/20       PT SHORT TERM GOAL #3   Title Pt strength to be improve by 1 grade to be able to go up and down steps in a reciprocol manner    Baseline non-reciprocal with HR and crutch    Time 3    Period Weeks             PT Long Term Goals - 11/19/20 1049      PT LONG TERM GOAL #1   Title Patient will improve on FOTO score to meet predicted outcomes to improve functional independence    Baseline 41% function    Time 6    Period Weeks    Status New    Target Date 12/31/20      PT LONG TERM GOAL #2   Title Patient will demonstrate improved ambulation as evidenced by distance of 300 ft during 2MWT using least restrictive AD    Baseline 110 ft with single crutch    Time 6    Period Weeks    Status New    Target Date 12/31/20                  Plan - 11/19/20 1038    Clinical Impression Statement Patient demonstrates generalized weakness, difficulty walking, unsteadiness on feet and knee pain limiting her function and reduced activity tolerance. Patient exhibits gait dysfunction and increased risk for falls due to her deficits and limitations.  Patient would benefit from PT services to increase strength, activity tolerance, and dynamic balance to reduce risk for falls and improve function to enhance quality of life and restore capabilities to PLOF    Personal Factors and Comorbidities Age;Comorbidity 3+;Time since onset of injury/illness/exacerbation;Past/Current Experience;Fitness    Comorbidities PMH    Examination-Activity Limitations Bed Mobility;Bend;Carry;Lift;Stand;Stairs;Squat;Locomotion Level    Stability/Clinical Decision Making Evolving/Moderate complexity    Clinical Decision Making Moderate    Rehab Potential Fair    PT Frequency 2x / week    PT Duration 6 weeks    PT Treatment/Interventions ADLs/Self Care Home Management;Aquatic Therapy;Biofeedback;Cryotherapy;Electrical Stimulation;DME Instruction;Ultrasound;Moist Heat;Gait training;Stair training;Functional mobility  training;Therapeutic activities;Therapeutic exercise;Balance training;Patient/family education;Neuromuscular re-education;Manual techniques;Passive range of motion;Taping;Splinting;Energy conservation;Dry needling;Spinal Manipulations;Joint Manipulations    PT Next Visit Plan continue with LE PRE    PT Home Exercise Plan quad set, SLR, hip add           Patient will benefit from skilled therapeutic intervention in order to improve the following deficits and impairments:  Abnormal gait,Decreased activity tolerance,Decreased balance,Decreased mobility,Decreased knowledge of use of DME,Decreased endurance,Decreased range of motion,Decreased strength,Difficulty walking,Impaired perceived functional ability,Improper body mechanics,Impaired UE functional use,Pain  Visit Diagnosis: Chronic pain of right knee  Stiffness of right knee, not elsewhere classified  Muscle weakness (generalized)  Difficulty in walking, not elsewhere classified     Problem List Patient Active Problem List   Diagnosis Date Noted  . Long term (current) use of opiate analgesic 01/21/2020  . Knee pain 01/21/2020  . Pain radiating to neck 01/21/2020  . Status post total right knee replacement 11/24/2018  . Alcohol use 10/23/2018  . Polyarticular arthritis 10/07/2018  . Low back pain 03/31/2017  . Status post cervical spinal fusion 03/31/2017  . S/P left unicompartmental knee replacement 08/15/2015  . Anemia 08/06/2015  . Stage 3 chronic kidney disease ([place]) 08/06/2015  .  Hepatitis C 03/02/2015  . Spondylosis, cervical, with myelopathy 09/21/2013  . Quadriplegia ([place]) 09/21/2013  . Osteoarthritis of left knee 09/21/2013  . Hypokalemia 08/16/2013  . AKI (acute kidney injury) ([place]) 08/16/2013  . Acute kidney failure, unspecified ([place]) 08/16/2013  . UTI (lower urinary tract infection) 08/07/2013  . Nausea & vomiting 08/07/2013  . Chronic kidney disease, stage IV (severe) ([place]) 08/07/2013  . Cervical myelopathy  ([place]) 08/07/2013  . Osteoarthrosis, unspecified whether generalized or localized, involving lower leg 06/03/2013  . GERD 06/12/2009  . CHEST PAIN 06/12/2009  . LEG PAIN, BILATERAL 05/28/2009  . OBESITY 12/17/2007  . DYSPHAGIA UNSPECIFIED 12/03/2007  . ANEMIA, NORMOCYTIC 07/13/2007  . ALCOHOL ABUSE 07/13/2007  . RENAL DISEASE, CHRONIC, STAGE II 07/13/2007  . Alcohol abuse 07/13/2007  . ANXIETY 06/29/2007  . DEPRESSION 06/29/2007  . Essential hypertension 06/29/2007  . CONSTIPATION 06/29/2007  . ARTHRITIS 06/29/2007  . MALAISE AND FATIGUE 06/29/2007    10:52 AM, 11/19/20 M. Sherlyn Lees, PT, DPT Physical Therapist- [place] Office Number: [phone]  [facility] [address] [place], [place], [address] Phone: [phone]   Fax:  [phone]  Name: RITAMARIE ARKIN MRN: [id number] Date of Birth: July 07, 1956

## 2020-11-21 ENCOUNTER — Other Ambulatory Visit: Payer: Self-pay

## 2020-11-21 ENCOUNTER — Encounter (HOSPITAL_COMMUNITY): Payer: Self-pay

## 2020-11-21 ENCOUNTER — Ambulatory Visit (HOSPITAL_COMMUNITY): Payer: Medicare Other

## 2020-11-21 DIAGNOSIS — G8929 Other chronic pain: Secondary | ICD-10-CM

## 2020-11-21 DIAGNOSIS — M25561 Pain in right knee: Secondary | ICD-10-CM | POA: Diagnosis not present

## 2020-11-21 DIAGNOSIS — R262 Difficulty in walking, not elsewhere classified: Secondary | ICD-10-CM

## 2020-11-21 DIAGNOSIS — M6281 Muscle weakness (generalized): Secondary | ICD-10-CM

## 2020-11-21 DIAGNOSIS — M25661 Stiffness of right knee, not elsewhere classified: Secondary | ICD-10-CM

## 2020-11-21 NOTE — Patient Instructions (Signed)
Access Code: [email or web address] URL: [email or web address] Date: 11/21/2020 Prepared by: Sherlyn Lees  Exercises Clamshell - 1 x daily - 7 x weekly - 3 sets - 10 reps - 2 hold Sit to Stand with Counter Support - 1 x daily - 7 x weekly - 3 sets - 10 reps

## 2020-11-21 NOTE — Therapy (Signed)
[facility] [address] [place], [place], [address] Phone: [phone]   Fax:  [phone]  Physical Therapy Treatment  Patient Details  Name: Colleen Nunez MRN: [id number] Date of Birth: 12/23/1955 Referring Provider (PT): Dr. Magda Bernheim, MD   Encounter Date: 11/21/2020   PT End of Session - 11/21/20 0830    Visit Number 2    Number of Visits 12    Date for PT Re-Evaluation 12/31/20    Authorization Type Medicare B 80/20    Progress Note Due on Visit 10    PT Start Time 0823   late arrival   PT Stop Time 0855    PT Time Calculation (min) 32 min    Activity Tolerance Patient tolerated treatment well;Patient limited by fatigue    Behavior During Therapy [facility] for tasks assessed/performed           Past Medical History:  Diagnosis Date  . Anemia   . Anxiety   . Arthritis   . Chronic hip pain   . Chronic knee pain   . Depression    h/o suicide attempts in the past  . GERD (gastroesophageal reflux disease)   . Gout   . Hepatitis C antibody test positive       . Hypertension   . Polysubstance abuse ([place])    h/o  . Recurrent falls   . Renal insufficiency     Past Surgical History:  Procedure Laterality Date  . ABDOMINAL HYSTERECTOMY    . ANTERIOR CERVICAL CORPECTOMY N/A 06/09/2013   Procedure: C4 C5 Corpectomy with C6-7 Anterior cervical fusion/Peek cage/Trestle plate;  Surgeon: Otilio Connors, MD;  Location: [facility];  Service: Neurosurgery;  Laterality: N/A;  Cervical Four, Cervical Five Corpectomy and Cervical six-seven Anterior cervical fusion/Peek cage Three-Five /Trestle Plating Cervical Three to Cervical Seven  . ANTERIOR CERVICAL DECOMP/DISCECTOMY FUSION N/A 08/12/2013   Procedure: Repair of Anterior  Cervical CSF LEAK, lumbar drain placement.;  Surgeon: Floyce Stakes, MD;  Location: [facility];  Service: Neurosurgery;  Laterality: N/A;  . bilateral SOO and appendectomy  2006  . cartilage removal     left knee   . COLONOSCOPY N/A 07/04/2015   Procedure: COLONOSCOPY;  Surgeon: Rogene Houston, MD;  [facility];  Service: Endoscopy;  Laterality: N/A;  100  . KNEE ARTHROSCOPY    . X-STOP IMPLANTATION      There were no vitals filed for this visit.   Subjective Assessment - 11/21/20 0827    Subjective Patient reports muscle soreness following last session and reports having to take more frequent breaks while performing housework as a result. Patient reports trying HEP activities one time since initial visit.    Currently in Pain? Yes    Pain Score 5     Pain Location Knee    Pain Orientation Right    Pain Descriptors / Indicators Aching    Pain Type Chronic pain    Pain Onset More than a month ago    Pain Frequency Intermittent                             [facility] Adult PT Treatment/Exercise - 11/21/20 0001      Knee/Hip Exercises: Standing   Other Standing Knee Exercises sit to stand 1x10      Knee/Hip Exercises: Supine   Quad Sets Strengthening;Right;3 sets;10 reps    Hip Adduction Isometric Strengthening;Both;3 sets;10  reps    Straight Leg Raises Strengthening;Right;3 sets;10 reps      Knee/Hip Exercises: Sidelying   Clams 2x10 RLE                  PT Education - 11/21/20 0834    Education Details pt educated on HEP activities and additions with explanation of muscle soreness and self-monitoring for adverse signs and symptoms.    Person(s) Educated Patient    Methods Explanation    Comprehension Verbalized understanding;Returned demonstration            PT Short Term Goals - 11/19/20 1047      PT SHORT TERM GOAL #1   Title Patient will report at least 25% improvement in symptoms for improved quality of life.    Time 3    Period Weeks    Status New    Target Date 12/10/20      PT SHORT TERM GOAL #2   Title Patient will be independent with HEP in order to improve functional outcomes.    Baseline in development    Time 3    Period  Weeks    Status New    Target Date 12/10/20      PT SHORT TERM GOAL #3   Title Pt strength to be improve by 1 grade to be able to go up and down steps in a reciprocol manner    Baseline non-reciprocal with HR and crutch    Time 3    Period Weeks             PT Long Term Goals - 11/19/20 1049      PT LONG TERM GOAL #1   Title Patient will improve on FOTO score to meet predicted outcomes to improve functional independence    Baseline 41% function    Time 6    Period Weeks    Status New    Target Date 12/31/20      PT LONG TERM GOAL #2   Title Patient will demonstrate improved ambulation as evidenced by distance of 300 ft during 2MWT using least restrictive AD    Baseline 110 ft with single crutch    Time 6    Period Weeks    Status New    Target Date 12/31/20                 Plan - 11/21/20 0843    Clinical Impression Statement Patient reports diffuse symptoms and generalized soreness "all over my body from my toes to the top of my head."  Patient demonstrates generalized weakness and overall poor functional activity tolerance requiring frequent therapeutic rest periods due to c/o symptoms.  Many of her complaints are not ameliorated with position or activity change. Patient requires frequent cues for proper form and sequence of movements/body mechanics.  Continued services indicated to improve overall strength and activity tolerance to improve functional capacity.    Personal Factors and Comorbidities Age;Comorbidity 3+;Time since onset of injury/illness/exacerbation;Past/Current Experience;Fitness    Comorbidities PMH    Examination-Activity Limitations Bed Mobility;Bend;Carry;Lift;Stand;Stairs;Squat;Locomotion Level    Examination-Participation Restrictions Shop;Cleaning;Meal Prep    Stability/Clinical Decision Making Evolving/Moderate complexity    Rehab Potential Fair    PT Frequency 2x / week    PT Duration 6 weeks    PT Treatment/Interventions ADLs/Self Care  Home Management;Aquatic Therapy;Biofeedback;Cryotherapy;Electrical Stimulation;DME Instruction;Ultrasound;Moist Heat;Gait training;Stair training;Functional mobility training;Therapeutic activities;Therapeutic exercise;Balance training;Patient/family education;Neuromuscular re-education;Manual techniques;Passive range of motion;Taping;Splinting;Energy conservation;Dry needling;Spinal Manipulations;Joint Manipulations    PT Next Visit Plan  continue with LE PRE    PT Home Exercise Plan quad set, SLR, hip add           Patient will benefit from skilled therapeutic intervention in order to improve the following deficits and impairments:  Abnormal gait,Decreased activity tolerance,Decreased balance,Decreased mobility,Decreased knowledge of use of DME,Decreased endurance,Decreased range of motion,Decreased strength,Difficulty walking,Impaired perceived functional ability,Improper body mechanics,Impaired UE functional use,Pain  Visit Diagnosis: Chronic pain of right knee  Stiffness of right knee, not elsewhere classified  Muscle weakness (generalized)  Difficulty in walking, not elsewhere classified     Problem List Patient Active Problem List   Diagnosis Date Noted  . Long term (current) use of opiate analgesic 01/21/2020  . Knee pain 01/21/2020  . Pain radiating to neck 01/21/2020  . Status post total right knee replacement 11/24/2018  . Alcohol use 10/23/2018  . Polyarticular arthritis 10/07/2018  . Low back pain 03/31/2017  . Status post cervical spinal fusion 03/31/2017  . S/P left unicompartmental knee replacement 08/15/2015  . Anemia 08/06/2015  . Stage 3 chronic kidney disease ([place]) 08/06/2015  . Hepatitis C 03/02/2015  . Spondylosis, cervical, with myelopathy 09/21/2013  . Quadriplegia ([place]) 09/21/2013  . Osteoarthritis of left knee 09/21/2013  . Hypokalemia 08/16/2013  . AKI (acute kidney injury) ([place]) 08/16/2013  . Acute kidney failure, unspecified ([place]) 08/16/2013  .  UTI (lower urinary tract infection) 08/07/2013  . Nausea & vomiting 08/07/2013  . Chronic kidney disease, stage IV (severe) ([place]) 08/07/2013  . Cervical myelopathy ([place]) 08/07/2013  . Osteoarthrosis, unspecified whether generalized or localized, involving lower leg 06/03/2013  . GERD 06/12/2009  . CHEST PAIN 06/12/2009  . LEG PAIN, BILATERAL 05/28/2009  . OBESITY 12/17/2007  . DYSPHAGIA UNSPECIFIED 12/03/2007  . ANEMIA, NORMOCYTIC 07/13/2007  . ALCOHOL ABUSE 07/13/2007  . RENAL DISEASE, CHRONIC, STAGE II 07/13/2007  . Alcohol abuse 07/13/2007  . ANXIETY 06/29/2007  . DEPRESSION 06/29/2007  . Essential hypertension 06/29/2007  . CONSTIPATION 06/29/2007  . ARTHRITIS 06/29/2007  . MALAISE AND FATIGUE 06/29/2007    9:00 AM, 11/21/20 M. Sherlyn Lees, PT, DPT Physical Therapist- [place] Office Number: [phone]  [facility] [address] [place], [place], [address] Phone: [phone]   Fax:  [phone]  Name: Colleen Nunez MRN: [id number] Date of Birth: 04-02-1956

## 2020-11-23 ENCOUNTER — Ambulatory Visit (HOSPITAL_COMMUNITY): Payer: Medicare Other

## 2020-11-23 ENCOUNTER — Other Ambulatory Visit: Payer: Self-pay

## 2020-11-23 ENCOUNTER — Encounter (HOSPITAL_COMMUNITY): Payer: Self-pay

## 2020-11-23 DIAGNOSIS — M25561 Pain in right knee: Secondary | ICD-10-CM | POA: Diagnosis not present

## 2020-11-23 DIAGNOSIS — R262 Difficulty in walking, not elsewhere classified: Secondary | ICD-10-CM

## 2020-11-23 DIAGNOSIS — M25661 Stiffness of right knee, not elsewhere classified: Secondary | ICD-10-CM

## 2020-11-23 DIAGNOSIS — G8929 Other chronic pain: Secondary | ICD-10-CM

## 2020-11-23 DIAGNOSIS — M6281 Muscle weakness (generalized): Secondary | ICD-10-CM

## 2020-11-23 NOTE — Therapy (Signed)
[place], [place], [address] Phone: [phone]   Fax:  [phone]  Physical Therapy Treatment  Patient Details  Name: KEYANA GUEVARA MRN: [id number] Date of Birth: 06-11-1956 Referring Provider (PT): Dr. Magda Bernheim, MD   Encounter Date: 11/23/2020   PT End of Session - 11/23/20 1045    Visit Number 3    Number of Visits 12    Date for PT Re-Evaluation 12/31/20    Authorization Type Medicare B 80/20    Progress Note Due on Visit 10    PT Start Time 4401    PT Stop Time 1122    PT Time Calculation (min) 42 min    Activity Tolerance Patient tolerated treatment well;Patient limited by fatigue;No increased pain    Behavior During Therapy WFL for tasks assessed/performed           Past Medical History:  Diagnosis Date  . Anemia   . Anxiety   . Arthritis   . Chronic hip pain   . Chronic knee pain   . Depression    h/o suicide attempts in the past  . GERD (gastroesophageal reflux disease)   . Gout   . Hepatitis C antibody test positive       . Hypertension   . Polysubstance abuse ([place])    h/o  . Recurrent falls   . Renal insufficiency     Past Surgical History:  Procedure Laterality Date  . ABDOMINAL HYSTERECTOMY    . ANTERIOR CERVICAL CORPECTOMY N/A 06/09/2013   Procedure: C4 C5 Corpectomy with C6-7 Anterior cervical fusion/Peek cage/Trestle plate;  Surgeon: Otilio Connors, MD;  Location: [facility];  Service: Neurosurgery;  Laterality: N/A;  Cervical Four, Cervical Five Corpectomy and Cervical six-seven Anterior cervical fusion/Peek cage Three-Five /Trestle Plating Cervical Three to Cervical Seven  . ANTERIOR CERVICAL DECOMP/DISCECTOMY FUSION N/A 08/12/2013   Procedure: Repair of Anterior  Cervical CSF LEAK, lumbar drain placement.;  Surgeon: Floyce Stakes, MD;  Location: [facility];  Service: Neurosurgery;  Laterality: N/A;  . bilateral SOO and appendectomy  2006  . cartilage removal     left  knee  . COLONOSCOPY N/A 07/04/2015   Procedure: COLONOSCOPY;  Surgeon: Rogene Houston, MD;  [facility];  Service: Endoscopy;  Laterality: N/A;  100  . KNEE ARTHROSCOPY    . X-STOP IMPLANTATION      There were no vitals filed for this visit.   Subjective Assessment - 11/23/20 1040    Subjective Pt reports soreness Rt knee and stiffness with the cold weather.  Reports she began STS at home without HHA.  Took potassium pill prior session today to reduce cramping.    Currently in Pain? Yes    Pain Score 5     Pain Location Knee    Pain Orientation Right    Pain Descriptors / Indicators Sore    Pain Type Chronic pain    Pain Onset More than a month ago    Pain Frequency Intermittent    Aggravating Factors  cold weather, LBP standing/ walk    Pain Relieving Factors pain meds                             [facility] Adult PT Treatment/Exercise - 11/23/20 0001      Exercises   Exercises Knee/Hip      Knee/Hip Exercises: Aerobic   Stationary Bike 4' warm  up for mobility seat 6      Knee/Hip Exercises: Supine   Quad Sets Strengthening;2 sets;10 reps    Quad Sets Limitations 5" holds tactile cueing    Hip Adduction Isometric Strengthening;Both;10 reps    Bridges 2 sets;5 reps    Bridges Limitations limited range, isometric gluteal sets 5"    Straight Leg Raises Strengthening;Right;3 sets;10 reps    Straight Leg Raises Limitations educated on positioning for pain control      Knee/Hip Exercises: Sidelying   Hip ABduction AAROM;2 sets;5 reps    Hip ABduction Limitations tactile cueing to reduce hip flexion    Clams 10x 5" BLE                    PT Short Term Goals - 11/19/20 1047      PT SHORT TERM GOAL #1   Title Patient will report at least 25% improvement in symptoms for improved quality of life.    Time 3    Period Weeks    Status New    Target Date 12/10/20      PT SHORT TERM GOAL #2   Title Patient will be independent with HEP in  order to improve functional outcomes.    Baseline in development    Time 3    Period Weeks    Status New    Target Date 12/10/20      PT SHORT TERM GOAL #3   Title Pt strength to be improve by 1 grade to be able to go up and down steps in a reciprocol manner    Baseline non-reciprocal with HR and crutch    Time 3    Period Weeks             PT Long Term Goals - 11/19/20 1049      PT LONG TERM GOAL #1   Title Patient will improve on FOTO score to meet predicted outcomes to improve functional independence    Baseline 41% function    Time 6    Period Weeks    Status New    Target Date 12/31/20      PT LONG TERM GOAL #2   Title Patient will demonstrate improved ambulation as evidenced by distance of 300 ft during 2MWT using least restrictive AD    Baseline 110 ft with single crutch    Time 6    Period Weeks    Status New    Target Date 12/31/20                 Plan - 11/23/20 1100    Clinical Impression Statement Added reciprocal bicycle for mobility to address stiffness.  Continued strengthening exercises, reviewed form to assure correct mechanics.  Pt was limited by arthritic pain symptoms through session, verbal and tactile cueing to improve positioning with exercises for pain control.  Educated on benefits with movements for strengthening, mobility and pain control.  Added bridges to POC for gluteal strengthening, limited range and cueing for gluteal activation vs lumbar activation.  EOS no reports pain reduced and feels looser.    Personal Factors and Comorbidities Age;Comorbidity 3+;Time since onset of injury/illness/exacerbation;Past/Current Experience;Fitness    Comorbidities PMH    Examination-Activity Limitations Bed Mobility;Bend;Carry;Lift;Stand;Stairs;Squat;Locomotion Level    Examination-Participation Restrictions Shop;Cleaning;Meal Prep    Stability/Clinical Decision Making Evolving/Moderate complexity    Clinical Decision Making Moderate    Rehab  Potential Fair    PT Frequency 2x / week    PT Duration  6 weeks    PT Treatment/Interventions ADLs/Self Care Home Management;Aquatic Therapy;Biofeedback;Cryotherapy;Electrical Stimulation;DME Instruction;Ultrasound;Moist Heat;Gait training;Stair training;Functional mobility training;Therapeutic activities;Therapeutic exercise;Balance training;Patient/family education;Neuromuscular re-education;Manual techniques;Passive range of motion;Taping;Splinting;Energy conservation;Dry needling;Spinal Manipulations;Joint Manipulations    PT Next Visit Plan Continue strengthening and mobility for pain control    PT Home Exercise Plan quad set, SLR, hip add, STS           Patient will benefit from skilled therapeutic intervention in order to improve the following deficits and impairments:  Abnormal gait,Decreased activity tolerance,Decreased balance,Decreased mobility,Decreased knowledge of use of DME,Decreased endurance,Decreased range of motion,Decreased strength,Difficulty walking,Impaired perceived functional ability,Improper body mechanics,Impaired UE functional use,Pain  Visit Diagnosis: Stiffness of right knee, not elsewhere classified  Muscle weakness (generalized)  Difficulty in walking, not elsewhere classified  Chronic pain of right knee     Problem List Patient Active Problem List   Diagnosis Date Noted  . Long term (current) use of opiate analgesic 01/21/2020  . Knee pain 01/21/2020  . Pain radiating to neck 01/21/2020  . Status post total right knee replacement 11/24/2018  . Alcohol use 10/23/2018  . Polyarticular arthritis 10/07/2018  . Low back pain 03/31/2017  . Status post cervical spinal fusion 03/31/2017  . S/P left unicompartmental knee replacement 08/15/2015  . Anemia 08/06/2015  . Stage 3 chronic kidney disease ([place]) 08/06/2015  . Hepatitis C 03/02/2015  . Spondylosis, cervical, with myelopathy 09/21/2013  . Quadriplegia ([place]) 09/21/2013  . Osteoarthritis of left  knee 09/21/2013  . Hypokalemia 08/16/2013  . AKI (acute kidney injury) ([place]) 08/16/2013  . Acute kidney failure, unspecified ([place]) 08/16/2013  . UTI (lower urinary tract infection) 08/07/2013  . Nausea & vomiting 08/07/2013  . Chronic kidney disease, stage IV (severe) ([place]) 08/07/2013  . Cervical myelopathy ([place]) 08/07/2013  . Osteoarthrosis, unspecified whether generalized or localized, involving lower leg 06/03/2013  . GERD 06/12/2009  . CHEST PAIN 06/12/2009  . LEG PAIN, BILATERAL 05/28/2009  . OBESITY 12/17/2007  . DYSPHAGIA UNSPECIFIED 12/03/2007  . ANEMIA, NORMOCYTIC 07/13/2007  . ALCOHOL ABUSE 07/13/2007  . RENAL DISEASE, CHRONIC, STAGE II 07/13/2007  . Alcohol abuse 07/13/2007  . ANXIETY 06/29/2007  . DEPRESSION 06/29/2007  . Essential hypertension 06/29/2007  . CONSTIPATION 06/29/2007  . ARTHRITIS 06/29/2007  . MALAISE AND FATIGUE 06/29/2007   Ihor Austin, LPTA/CLT; CBIS [phone]  Aldona Lento 11/23/2020, 11:36 AM  [facility] [address] [place], [place], [address] Phone: [phone]   Fax:  [phone]  Name: INNOCENCE SCHLOTZHAUER MRN: [id number] Date of Birth: [birth year]

## 2020-11-26 ENCOUNTER — Telehealth (HOSPITAL_COMMUNITY): Payer: Self-pay | Admitting: Physical Therapy

## 2020-11-26 ENCOUNTER — Ambulatory Visit (HOSPITAL_COMMUNITY): Payer: Medicare Other | Admitting: Physical Therapy

## 2020-11-26 ENCOUNTER — Other Ambulatory Visit: Payer: Self-pay

## 2020-11-26 DIAGNOSIS — M25561 Pain in right knee: Secondary | ICD-10-CM | POA: Diagnosis not present

## 2020-11-26 DIAGNOSIS — M6281 Muscle weakness (generalized): Secondary | ICD-10-CM

## 2020-11-26 DIAGNOSIS — R262 Difficulty in walking, not elsewhere classified: Secondary | ICD-10-CM

## 2020-11-26 DIAGNOSIS — M25661 Stiffness of right knee, not elsewhere classified: Secondary | ICD-10-CM

## 2020-11-26 NOTE — Telephone Encounter (Signed)
No show #1. Called and left VM about missed appointment and about upcoming appointment.   8:50 AM, 11/26/20 Jerene Pitch, DPT Physical Therapy with [facility]  [phone] office

## 2020-11-26 NOTE — Therapy (Signed)
[place], [place], [address] Phone: [phone]   Fax:  [phone]  Physical Therapy Treatment  Patient Details  Name: Colleen Nunez MRN: [id number] Date of Birth: 06-16-1956 Referring Provider (PT): Dr. Magda Bernheim, MD   Encounter Date: 11/26/2020   PT End of Session - 11/26/20 1000    Visit Number 4    Number of Visits 12    Date for PT Re-Evaluation 12/31/20    Authorization Type Medicare B 80/20    Progress Note Due on Visit 10    PT Start Time 0910    PT Stop Time 0958    PT Time Calculation (min) 48 min    Activity Tolerance Patient tolerated treatment well;Patient limited by fatigue;No increased pain    Behavior During Therapy WFL for tasks assessed/performed           Past Medical History:  Diagnosis Date  . Anemia   . Anxiety   . Arthritis   . Chronic hip pain   . Chronic knee pain   . Depression    h/o suicide attempts in the past  . GERD (gastroesophageal reflux disease)   . Gout   . Hepatitis C antibody test positive       . Hypertension   . Polysubstance abuse ([place])    h/o  . Recurrent falls   . Renal insufficiency     Past Surgical History:  Procedure Laterality Date  . ABDOMINAL HYSTERECTOMY    . ANTERIOR CERVICAL CORPECTOMY N/A 06/09/2013   Procedure: C4 C5 Corpectomy with C6-7 Anterior cervical fusion/Peek cage/Trestle plate;  Surgeon: Otilio Connors, MD;  Location: [facility];  Service: Neurosurgery;  Laterality: N/A;  Cervical Four, Cervical Five Corpectomy and Cervical six-seven Anterior cervical fusion/Peek cage Three-Five /Trestle Plating Cervical Three to Cervical Seven  . ANTERIOR CERVICAL DECOMP/DISCECTOMY FUSION N/A 08/12/2013   Procedure: Repair of Anterior  Cervical CSF LEAK, lumbar drain placement.;  Surgeon: Floyce Stakes, MD;  Location: [facility];  Service: Neurosurgery;  Laterality: N/A;  . bilateral SOO and appendectomy  2006  . cartilage removal     left  knee  . COLONOSCOPY N/A 07/04/2015   Procedure: COLONOSCOPY;  Surgeon: Rogene Houston, MD;  [facility];  Service: Endoscopy;  Laterality: N/A;  100  . KNEE ARTHROSCOPY    . X-STOP IMPLANTATION      There were no vitals filed for this visit.   Subjective Assessment - 11/26/20 0937    Subjective pt was late for appt and worked into schedule due to transportation issues.   states she does not feel good today as she's been up all night drinking coffee and cleaning her house.  States she has taken her BP meds; states she is sore and plans on taking a nap when she gets back home.  After all measures checked and cleared (BP/HR/O2 sates), pt aggreable to continue and participate with therapy today. States she is eager to get a splint for her Lt hand.                             [facility] Adult PT Treatment/Exercise - 11/26/20 0001      Knee/Hip Exercises: Supine   Quad Sets Strengthening;2 sets;10 reps    Quad Sets Limitations 5 sec holds    Bridges 10 reps    Bridges Limitations isometric 5" holds    Straight Leg  Raises Strengthening;Right;3 sets;10 reps    Straight Leg Raises Limitations educated on positioning for pain control    Other Supine Knee/Hip Exercises supine clams 10X each                    PT Short Term Goals - 11/19/20 1047      PT SHORT TERM GOAL #1   Title Patient will report at least 25% improvement in symptoms for improved quality of life.    Time 3    Period Weeks    Status New    Target Date 12/10/20      PT SHORT TERM GOAL #2   Title Patient will be independent with HEP in order to improve functional outcomes.    Baseline in development    Time 3    Period Weeks    Status New    Target Date 12/10/20      PT SHORT TERM GOAL #3   Title Pt strength to be improve by 1 grade to be able to go up and down steps in a reciprocol manner    Baseline non-reciprocal with HR and crutch    Time 3    Period Weeks              PT Long Term Goals - 11/19/20 1049      PT LONG TERM GOAL #1   Title Patient will improve on FOTO score to meet predicted outcomes to improve functional independence    Baseline 41% function    Time 6    Period Weeks    Status New    Target Date 12/31/20      PT LONG TERM GOAL #2   Title Patient will demonstrate improved ambulation as evidenced by distance of 300 ft during 2MWT using least restrictive AD    Baseline 110 ft with single crutch    Time 6    Period Weeks    Status New    Target Date 12/31/20                 Plan - 11/26/20 1000    Clinical Impression Statement BP, HR and Oxygen saturation assessed before beginning therex.  All measures WNL (110/55mmhg, 79bpm and 97% respectively).  Pt easily distracted, focused on getting shots in her knees and getting a brace made for her fingers;  Constant cues for keeping count and full holds with therex.  Repeats herself often.   Unable to lift bottom with bridge completing only glute isometric stating it is due to her arthritis today. Able to complete additional requested therex without any difficulties. Pt questioning status of OT consult for hand splint; discussed with evaluating therapist who will request.    Personal Factors and Comorbidities Age;Comorbidity 3+;Time since onset of injury/illness/exacerbation;Past/Current Experience;Fitness    Comorbidities PMH    Examination-Activity Limitations Bed Mobility;Bend;Carry;Lift;Stand;Stairs;Squat;Locomotion Level    Examination-Participation Restrictions Shop;Cleaning;Meal Prep    Stability/Clinical Decision Making Evolving/Moderate complexity    Rehab Potential Fair    PT Frequency 2x / week    PT Duration 6 weeks    PT Treatment/Interventions ADLs/Self Care Home Management;Aquatic Therapy;Biofeedback;Cryotherapy;Electrical Stimulation;DME Instruction;Ultrasound;Moist Heat;Gait training;Stair training;Functional mobility training;Therapeutic activities;Therapeutic  exercise;Balance training;Patient/family education;Neuromuscular re-education;Manual techniques;Passive range of motion;Taping;Splinting;Energy conservation;Dry needling;Spinal Manipulations;Joint Manipulations    PT Next Visit Plan Continue strengthening and mobility for pain control.  Follow up on OT consult; progress glute strength    PT Home Exercise Plan quad set, SLR, hip add, STS  Patient will benefit from skilled therapeutic intervention in order to improve the following deficits and impairments:  Abnormal gait,Decreased activity tolerance,Decreased balance,Decreased mobility,Decreased knowledge of use of DME,Decreased endurance,Decreased range of motion,Decreased strength,Difficulty walking,Impaired perceived functional ability,Improper body mechanics,Impaired UE functional use,Pain  Visit Diagnosis: Muscle weakness (generalized)  Stiffness of right knee, not elsewhere classified  Difficulty in walking, not elsewhere classified     Problem List Patient Active Problem List   Diagnosis Date Noted  . Long term (current) use of opiate analgesic 01/21/2020  . Knee pain 01/21/2020  . Pain radiating to neck 01/21/2020  . Status post total right knee replacement 11/24/2018  . Alcohol use 10/23/2018  . Polyarticular arthritis 10/07/2018  . Low back pain 03/31/2017  . Status post cervical spinal fusion 03/31/2017  . S/P left unicompartmental knee replacement 08/15/2015  . Anemia 08/06/2015  . Stage 3 chronic kidney disease ([place]) 08/06/2015  . Hepatitis C 03/02/2015  . Spondylosis, cervical, with myelopathy 09/21/2013  . Quadriplegia ([place]) 09/21/2013  . Osteoarthritis of left knee 09/21/2013  . Hypokalemia 08/16/2013  . AKI (acute kidney injury) ([place]) 08/16/2013  . Acute kidney failure, unspecified ([place]) 08/16/2013  . UTI (lower urinary tract infection) 08/07/2013  . Nausea & vomiting 08/07/2013  . Chronic kidney disease, stage IV (severe) ([place]) 08/07/2013  .  Cervical myelopathy ([place]) 08/07/2013  . Osteoarthrosis, unspecified whether generalized or localized, involving lower leg 06/03/2013  . GERD 06/12/2009  . CHEST PAIN 06/12/2009  . LEG PAIN, BILATERAL 05/28/2009  . OBESITY 12/17/2007  . DYSPHAGIA UNSPECIFIED 12/03/2007  . ANEMIA, NORMOCYTIC 07/13/2007  . ALCOHOL ABUSE 07/13/2007  . RENAL DISEASE, CHRONIC, STAGE II 07/13/2007  . Alcohol abuse 07/13/2007  . ANXIETY 06/29/2007  . DEPRESSION 06/29/2007  . Essential hypertension 06/29/2007  . CONSTIPATION 06/29/2007  . ARTHRITIS 06/29/2007  . MALAISE AND FATIGUE 06/29/2007   Teena Irani, PTA/CLT [phone]  Teena Irani 11/26/2020, 10:03 AM  [facility] [address] [place], [place], [address] Phone: [phone]   Fax:  [phone]  Name: Colleen Nunez MRN: [id number] Date of Birth: Mar 27, 1956

## 2020-11-28 ENCOUNTER — Ambulatory Visit (HOSPITAL_COMMUNITY): Payer: Medicare Other | Admitting: Physical Therapy

## 2020-11-28 ENCOUNTER — Other Ambulatory Visit: Payer: Self-pay

## 2020-11-28 ENCOUNTER — Encounter (HOSPITAL_COMMUNITY): Payer: Self-pay | Admitting: Physical Therapy

## 2020-11-28 DIAGNOSIS — M25661 Stiffness of right knee, not elsewhere classified: Secondary | ICD-10-CM

## 2020-11-28 DIAGNOSIS — M6281 Muscle weakness (generalized): Secondary | ICD-10-CM

## 2020-11-28 DIAGNOSIS — R262 Difficulty in walking, not elsewhere classified: Secondary | ICD-10-CM

## 2020-11-28 DIAGNOSIS — G8929 Other chronic pain: Secondary | ICD-10-CM

## 2020-11-28 DIAGNOSIS — M25561 Pain in right knee: Secondary | ICD-10-CM | POA: Diagnosis not present

## 2020-11-28 NOTE — Therapy (Signed)
[facility] [place], [place], [address] Phone: [phone]   Fax:  [phone]  Physical Therapy Treatment  Patient Details  Name: Colleen Nunez MRN: [id number] Date of Birth: [birth year] Referring Provider (PT): Dr. Magda Bernheim, MD   Encounter Date: 11/28/2020   PT End of Session - 11/28/20 0820    Visit Number 5    Number of Visits 12    Date for PT Re-Evaluation 12/31/20    Authorization Type Medicare B 80/20    Progress Note Due on Visit 10    PT Start Time 0822    PT Stop Time 0900    PT Time Calculation (min) 38 min    Activity Tolerance Patient tolerated treatment well;Patient limited by fatigue;No increased pain    Behavior During Therapy WFL for tasks assessed/performed           Past Medical History:  Diagnosis Date  . Anemia   . Anxiety   . Arthritis   . Chronic hip pain   . Chronic knee pain   . Depression    h/o suicide attempts in the past  . GERD (gastroesophageal reflux disease)   . Gout   . Hepatitis C antibody test positive       . Hypertension   . Polysubstance abuse ([place])    h/o  . Recurrent falls   . Renal insufficiency     Past Surgical History:  Procedure Laterality Date  . ABDOMINAL HYSTERECTOMY    . ANTERIOR CERVICAL CORPECTOMY N/A 06/09/2013   Procedure: C4 C5 Corpectomy with C6-7 Anterior cervical fusion/Peek cage/Trestle plate;  Surgeon: Otilio Connors, MD;  Location: [facility];  Service: Neurosurgery;  Laterality: N/A;  Cervical Four, Cervical Five Corpectomy and Cervical six-seven Anterior cervical fusion/Peek cage Three-Five /Trestle Plating Cervical Three to Cervical Seven  . ANTERIOR CERVICAL DECOMP/DISCECTOMY FUSION N/A 08/12/2013   Procedure: Repair of Anterior  Cervical CSF LEAK, lumbar drain placement.;  Surgeon: Floyce Stakes, MD;  Location: [facility];  Service: Neurosurgery;  Laterality: N/A;  . bilateral SOO and appendectomy  2006  . cartilage removal     left  knee  . COLONOSCOPY N/A 07/04/2015   Procedure: COLONOSCOPY;  Surgeon: Rogene Houston, MD;  [facility];  Service: Endoscopy;  Laterality: N/A;  100  . KNEE ARTHROSCOPY    . X-STOP IMPLANTATION      There were no vitals filed for this visit.   Subjective Assessment - 11/28/20 0824    Subjective Patient states she is feeling better today compared to last time. Patient states she has been doing her exercises.    Currently in Pain? Yes    Pain Location Knee    Pain Orientation Right    Pain Descriptors / Indicators Aching;Sore    Pain Type Chronic pain                             [facility] Adult PT Treatment/Exercise - 11/28/20 0001      Knee/Hip Exercises: Aerobic   Recumbent Bike 5 minutes for mobility and cardiovasular fitness at end of session level 2      Knee/Hip Exercises: Standing   Hip Abduction Both;2 sets;10 reps    Forward Step Up Right;1 set;10 reps;Hand Hold: 2;Step Height: 4"      Knee/Hip Exercises: Seated   Sit to Sand 10 reps;without UE support      Knee/Hip Exercises:  Supine   Straight Leg Raises Right;2 sets;10 reps    Straight Leg Raises Limitations with cueing for quad set                  PT Education - 11/28/20 0823    Education Details Patient educated on HEP, exercise mechanics    Person(s) Educated Patient    Methods Explanation;Demonstration    Comprehension Verbalized understanding;Returned demonstration            PT Short Term Goals - 11/19/20 1047      PT SHORT TERM GOAL #1   Title Patient will report at least 25% improvement in symptoms for improved quality of life.    Time 3    Period Weeks    Status New    Target Date 12/10/20      PT SHORT TERM GOAL #2   Title Patient will be independent with HEP in order to improve functional outcomes.    Baseline in development    Time 3    Period Weeks    Status New    Target Date 12/10/20      PT SHORT TERM GOAL #3   Title Pt strength to be improve  by 1 grade to be able to go up and down steps in a reciprocol manner    Baseline non-reciprocal with HR and crutch    Time 3    Period Weeks             PT Long Term Goals - 11/19/20 1049      PT LONG TERM GOAL #1   Title Patient will improve on FOTO score to meet predicted outcomes to improve functional independence    Baseline 41% function    Time 6    Period Weeks    Status New    Target Date 12/31/20      PT LONG TERM GOAL #2   Title Patient will demonstrate improved ambulation as evidenced by distance of 300 ft during 2MWT using least restrictive AD    Baseline 110 ft with single crutch    Time 6    Period Weeks    Status New    Target Date 12/31/20                 Plan - 11/28/20 3419    Clinical Impression Statement Patient asked about referral to OT for her hands, checked for OT referral in system but did not see anything added at this time, continue to f/u with. Patient given cueing for quad set with SLR with good carry over. Patient requires cueing for posture with hip abduction exercise. Patient performs sit to stand with good eccentric control today with very slow movements without UE support. Patient takes frequent rest breaks during session secondary to fatigue. Added step up for improving functional LE strength today and patient requires bilateral UE support to assist. Patient will continue to benefit from skilled physical therapy in order to reduce impairment and improve function.    Personal Factors and Comorbidities Age;Comorbidity 3+;Time since onset of injury/illness/exacerbation;Past/Current Experience;Fitness    Comorbidities PMH    Examination-Activity Limitations Bed Mobility;Bend;Carry;Lift;Stand;Stairs;Squat;Locomotion Level    Examination-Participation Restrictions Shop;Cleaning;Meal Prep    Stability/Clinical Decision Making Evolving/Moderate complexity    Rehab Potential Fair    PT Frequency 2x / week    PT Duration 6 weeks    PT  Treatment/Interventions ADLs/Self Care Home Management;Aquatic Therapy;Biofeedback;Cryotherapy;Electrical Stimulation;DME Instruction;Ultrasound;Moist Heat;Gait training;Stair training;Functional mobility training;Therapeutic activities;Therapeutic exercise;Balance training;Patient/family education;Neuromuscular re-education;Manual  techniques;Passive range of motion;Taping;Splinting;Energy conservation;Dry needling;Spinal Manipulations;Joint Manipulations    PT Next Visit Plan Continue strengthening and mobility for pain control.  Follow up on OT consult; progress glute strength    PT Home Exercise Plan quad set, SLR, hip add, STS           Patient will benefit from skilled therapeutic intervention in order to improve the following deficits and impairments:  Abnormal gait,Decreased activity tolerance,Decreased balance,Decreased mobility,Decreased knowledge of use of DME,Decreased endurance,Decreased range of motion,Decreased strength,Difficulty walking,Impaired perceived functional ability,Improper body mechanics,Impaired UE functional use,Pain  Visit Diagnosis: Muscle weakness (generalized)  Stiffness of right knee, not elsewhere classified  Difficulty in walking, not elsewhere classified  Chronic pain of right knee     Problem List Patient Active Problem List   Diagnosis Date Noted  . Long term (current) use of opiate analgesic 01/21/2020  . Knee pain 01/21/2020  . Pain radiating to neck 01/21/2020  . Status post total right knee replacement 11/24/2018  . Alcohol use 10/23/2018  . Polyarticular arthritis 10/07/2018  . Low back pain 03/31/2017  . Status post cervical spinal fusion 03/31/2017  . S/P left unicompartmental knee replacement 08/15/2015  . Anemia 08/06/2015  . Stage 3 chronic kidney disease ([place]) 08/06/2015  . Hepatitis C 03/02/2015  . Spondylosis, cervical, with myelopathy 09/21/2013  . Quadriplegia ([place]) 09/21/2013  . Osteoarthritis of left knee 09/21/2013  .  Hypokalemia 08/16/2013  . AKI (acute kidney injury) ([place]) 08/16/2013  . Acute kidney failure, unspecified ([place]) 08/16/2013  . UTI (lower urinary tract infection) 08/07/2013  . Nausea & vomiting 08/07/2013  . Chronic kidney disease, stage IV (severe) ([place]) 08/07/2013  . Cervical myelopathy ([place]) 08/07/2013  . Osteoarthrosis, unspecified whether generalized or localized, involving lower leg 06/03/2013  . GERD 06/12/2009  . CHEST PAIN 06/12/2009  . LEG PAIN, BILATERAL 05/28/2009  . OBESITY 12/17/2007  . DYSPHAGIA UNSPECIFIED 12/03/2007  . ANEMIA, NORMOCYTIC 07/13/2007  . ALCOHOL ABUSE 07/13/2007  . RENAL DISEASE, CHRONIC, STAGE II 07/13/2007  . Alcohol abuse 07/13/2007  . ANXIETY 06/29/2007  . DEPRESSION 06/29/2007  . Essential hypertension 06/29/2007  . CONSTIPATION 06/29/2007  . ARTHRITIS 06/29/2007  . MALAISE AND FATIGUE 06/29/2007    8:58 AM, 11/28/20 Mearl Latin PT, DPT Physical Therapist at [facility] [place], [place], [address] Phone: [phone]   Fax:  [phone]  Name: Colleen Nunez MRN: [id number] Date of Birth: 05-30-1956

## 2020-11-30 ENCOUNTER — Encounter (HOSPITAL_COMMUNITY): Payer: Medicare Other | Admitting: Physical Therapy

## 2020-11-30 ENCOUNTER — Telehealth (HOSPITAL_COMMUNITY): Payer: Self-pay

## 2020-11-30 DIAGNOSIS — B372 Candidiasis of skin and nail: Secondary | ICD-10-CM | POA: Insufficient documentation

## 2020-11-30 NOTE — Telephone Encounter (Signed)
L/m to call our voicemail if you have any concerns if  the office is open or closed on 12/03/20 due to weather 

## 2020-12-03 ENCOUNTER — Ambulatory Visit (HOSPITAL_COMMUNITY): Payer: Medicare Other

## 2020-12-05 ENCOUNTER — Telehealth (HOSPITAL_COMMUNITY): Payer: Self-pay | Admitting: Physical Therapy

## 2020-12-05 ENCOUNTER — Ambulatory Visit (HOSPITAL_COMMUNITY): Payer: Medicare Other | Admitting: Physical Therapy

## 2020-12-05 NOTE — Telephone Encounter (Signed)
Pt did not show for appointment.  Called and spoke to patient who states she was unable to get out of her driveway this morning due to the snow.  Reminded of next appt Friday and to kindly call to cancel if she is unable to make it.   Teena Irani, PTA/CLT [phone]

## 2020-12-06 ENCOUNTER — Encounter: Payer: Self-pay | Admitting: Orthopedic Surgery

## 2020-12-06 ENCOUNTER — Other Ambulatory Visit: Payer: Self-pay

## 2020-12-06 ENCOUNTER — Ambulatory Visit (INDEPENDENT_AMBULATORY_CARE_PROVIDER_SITE_OTHER): Payer: Medicare Other | Admitting: Orthopedic Surgery

## 2020-12-06 VITALS — BP 155/92 | HR 93 | Ht 62.0 in | Wt 177.0 lb

## 2020-12-06 DIAGNOSIS — M7062 Trochanteric bursitis, left hip: Secondary | ICD-10-CM | POA: Diagnosis not present

## 2020-12-06 NOTE — Progress Notes (Signed)
65 year old female with chronic left greater trochanteric bursitis received injections, was scheduled for 80-month injection but pain became severe and she was seen as a work in  Requests injection left hip for bursitis  Procedure note injection for left hip bursitis  Verbal consent was obtained for injection of the  left hip   Timeout was completed to confirm the injection site  The medications used were 40 mg of Depo-Medrol and 1% lidocaine 3 cc  Anesthesia was provided by ethyl chloride and the skin was prepped with alcohol.  After cleaning the skin with alcohol a 25-gauge needle was used to inject the left hip greater trochanteric bursa  Encounter Diagnosis  Name Primary?  . Trochanteric bursitis of left hip Yes

## 2020-12-07 ENCOUNTER — Telehealth (HOSPITAL_COMMUNITY): Payer: Self-pay

## 2020-12-07 ENCOUNTER — Encounter (HOSPITAL_COMMUNITY): Payer: Self-pay

## 2020-12-07 ENCOUNTER — Ambulatory Visit (HOSPITAL_COMMUNITY): Payer: Medicare Other

## 2020-12-07 DIAGNOSIS — G8929 Other chronic pain: Secondary | ICD-10-CM

## 2020-12-07 DIAGNOSIS — M25561 Pain in right knee: Secondary | ICD-10-CM | POA: Diagnosis not present

## 2020-12-07 DIAGNOSIS — M25661 Stiffness of right knee, not elsewhere classified: Secondary | ICD-10-CM

## 2020-12-07 DIAGNOSIS — R262 Difficulty in walking, not elsewhere classified: Secondary | ICD-10-CM

## 2020-12-07 DIAGNOSIS — M6281 Muscle weakness (generalized): Secondary | ICD-10-CM

## 2020-12-07 NOTE — Therapy (Signed)
[facility] [place], [place], [address] Phone: [phone]   Fax:  [phone]  Physical Therapy Treatment  Patient Details  Name: Colleen Nunez MRN: [id number] Date of Birth: [birth year] Referring Provider (PT): Dr. Magda Bernheim, MD   Encounter Date: 12/07/2020   PT End of Session - 12/07/20 0835    Visit Number 6    Number of Visits 12    Date for PT Re-Evaluation 12/31/20    Authorization Type Medicare B 80/20    Progress Note Due on Visit 10    PT Start Time 0830    PT Stop Time 0910    PT Time Calculation (min) 40 min    Activity Tolerance Patient tolerated treatment well;Patient limited by fatigue;No increased pain    Behavior During Therapy WFL for tasks assessed/performed           Past Medical History:  Diagnosis Date   Anemia    Anxiety    Arthritis    Chronic hip pain    Chronic knee pain    Depression    h/o suicide attempts in the past   GERD (gastroesophageal reflux disease)    Gout    Hepatitis C antibody test positive        Hypertension    Polysubstance abuse ([place])    h/o   Recurrent falls    Renal insufficiency     Past Surgical History:  Procedure Laterality Date   ABDOMINAL HYSTERECTOMY     ANTERIOR CERVICAL CORPECTOMY N/A 06/09/2013   Procedure: C4 C5 Corpectomy with C6-7 Anterior cervical fusion/Peek cage/Trestle plate;  Surgeon: Otilio Connors, MD;  Location: [facility];  Service: Neurosurgery;  Laterality: N/A;  Cervical Four, Cervical Five Corpectomy and Cervical six-seven Anterior cervical fusion/Peek cage Three-Five /Trestle Plating Cervical Three to Cervical Seven   ANTERIOR CERVICAL DECOMP/DISCECTOMY FUSION N/A 08/12/2013   Procedure: Repair of Anterior  Cervical CSF LEAK, lumbar drain placement.;  Surgeon: Floyce Stakes, MD;  Location: [facility];  Service: Neurosurgery;  Laterality: N/A;   bilateral SOO and appendectomy  2006   cartilage removal     left  knee   COLONOSCOPY N/A 07/04/2015   Procedure: COLONOSCOPY;  Surgeon: Rogene Houston, MD;  [facility];  Service: Endoscopy;  Laterality: N/A;  100   KNEE ARTHROSCOPY     X-STOP IMPLANTATION      There were no vitals filed for this visit.   Subjective Assessment - 12/07/20 0834    Subjective PAtient had injection to left hip bursa yesterday and reports some relief in pain since the injection. Reports she has been completing HEP 1x/day and feels like her right leg is getting stronger.    Currently in Pain? Yes    Pain Score 3     Pain Location Knee    Pain Orientation Right    Pain Descriptors / Indicators Aching;Sore    Pain Type Chronic pain                             [facility] Adult PT Treatment/Exercise - 12/07/20 0001      Knee/Hip Exercises: Standing   Knee Flexion Strengthening;Both;2 sets;10 reps    Knee Flexion Limitations 3 lbs    Hip Flexion Stengthening;Both;2 sets;10 reps    Hip Flexion Limitations 3 lbs, 6" step target      Knee/Hip Exercises: Supine   Quad Sets Strengthening;Right;3 sets;10  reps    [employer] Limitations 3 sec hold    Short Arc [employer] Strengthening;Both;3 sets;10 reps    Short Arc [employer] Limitations 3 lbs    Hip Adduction Isometric Strengthening;Both;3 sets;10 reps    Straight Leg Raises Strengthening;Right;2 sets;10 reps                  PT Education - 12/07/20 0840    Education Details Reinforced idea of increasing her HEP frequency, especially isometrics when she is stationary at home to improve strength/activity tolerance.    Person(s) Educated Patient    Methods Explanation    Comprehension Verbalized understanding            PT Short Term Goals - 11/19/20 1047      PT SHORT TERM GOAL #1   Title Patient will report at least 25% improvement in symptoms for improved quality of life.    Time 3    Period Weeks    Status New    Target Date 12/10/20      PT SHORT TERM GOAL #2   Title  Patient will be independent with HEP in order to improve functional outcomes.    Baseline in development    Time 3    Period Weeks    Status New    Target Date 12/10/20      PT SHORT TERM GOAL #3   Title Pt strength to be improve by 1 grade to be able to go up and down steps in a reciprocol manner    Baseline non-reciprocal with HR and crutch    Time 3    Period Weeks             PT Long Term Goals - 11/19/20 1049      PT LONG TERM GOAL #1   Title Patient will improve on FOTO score to meet predicted outcomes to improve functional independence    Baseline 41% function    Time 6    Period Weeks    Status New    Target Date 12/31/20      PT LONG TERM GOAL #2   Title Patient will demonstrate improved ambulation as evidenced by distance of 300 ft during 2MWT using least restrictive AD    Baseline 110 ft with single crutch    Time 6    Period Weeks    Status New    Target Date 12/31/20                 Plan - 12/07/20 0850    Clinical Impression Statement This therapist submitted referral for OT hand/UE evaluation.  Demonstrates improved RLE strength as evidenced by ability to complete SLR x 10 reps with minimal extension lag.  Patient self-reports improved RLE strength and has noticed easier time completing mobility at a house-hold level.  Continues to exhibit balance deficits with dynamic standing requiring at least unilateral UE support with instances of unsteadiness requiring BUE support to prevent LOB.  Continue POC to improve BLE strength and incorporate more dynamic balance activities.    Personal Factors and Comorbidities Age;Comorbidity 3+;Time since onset of injury/illness/exacerbation;Past/Current Experience;Fitness    Comorbidities PMH    Examination-Activity Limitations Bed Mobility;Bend;Carry;Lift;Stand;Stairs;Squat;Locomotion Level    Examination-Participation Restrictions Shop;Cleaning;Meal Prep    Stability/Clinical Decision Making Evolving/Moderate  complexity    Rehab Potential Fair    PT Frequency 2x / week    PT Duration 6 weeks    PT Treatment/Interventions ADLs/Self Care Home Management;Aquatic Therapy;Biofeedback;Cryotherapy;Electrical Stimulation;DME  Instruction;Ultrasound;Moist Heat;Gait training;Stair training;Functional mobility training;Therapeutic activities;Therapeutic exercise;Balance training;Patient/family education;Neuromuscular re-education;Manual techniques;Passive range of motion;Taping;Splinting;Energy conservation;Dry needling;Spinal Manipulations;Joint Manipulations    PT Next Visit Plan Continue strengthening and mobility for pain control.  Follow up on OT consult; progress glute strength. Dynamic movements, e.g. side stepping with weights    PT Home Exercise Plan quad set, SLR, hip add, STS           Patient will benefit from skilled therapeutic intervention in order to improve the following deficits and impairments:  Abnormal gait,Decreased activity tolerance,Decreased balance,Decreased mobility,Decreased knowledge of use of DME,Decreased endurance,Decreased range of motion,Decreased strength,Difficulty walking,Impaired perceived functional ability,Improper body mechanics,Impaired UE functional use,Pain  Visit Diagnosis: Muscle weakness (generalized)  Stiffness of right knee, not elsewhere classified  Difficulty in walking, not elsewhere classified  Chronic pain of right knee     Problem List Patient Active Problem List   Diagnosis Date Noted   Long term (current) use of opiate analgesic 01/21/2020   Knee pain 01/21/2020   Pain radiating to neck 01/21/2020   Status post total right knee replacement 11/24/2018   Alcohol use 10/23/2018   Polyarticular arthritis 10/07/2018   Low back pain 03/31/2017   Status post cervical spinal fusion 03/31/2017   S/P left unicompartmental knee replacement 08/15/2015   Anemia 08/06/2015   Stage 3 chronic kidney disease ([place]) 08/06/2015   Hepatitis C  03/02/2015   Spondylosis, cervical, with myelopathy 09/21/2013   Quadriplegia ([place]) 09/21/2013   Osteoarthritis of left knee 09/21/2013   Hypokalemia 08/16/2013   AKI (acute kidney injury) ([place]) 08/16/2013   Acute kidney failure, unspecified ([place]) 08/16/2013   UTI (lower urinary tract infection) 08/07/2013   Nausea & vomiting 08/07/2013   Chronic kidney disease, stage IV (severe) ([place]) 08/07/2013   Cervical myelopathy ([place]) 08/07/2013   Osteoarthrosis, unspecified whether generalized or localized, involving lower leg 06/03/2013   GERD 06/12/2009   CHEST PAIN 06/12/2009   LEG PAIN, BILATERAL 05/28/2009   OBESITY 12/17/2007   DYSPHAGIA UNSPECIFIED 12/03/2007   ANEMIA, NORMOCYTIC 07/13/2007   ALCOHOL ABUSE 07/13/2007   RENAL DISEASE, CHRONIC, STAGE II 07/13/2007   Alcohol abuse 07/13/2007   ANXIETY 06/29/2007   DEPRESSION 06/29/2007   Essential hypertension 06/29/2007   CONSTIPATION 06/29/2007   ARTHRITIS 06/29/2007   MALAISE AND FATIGUE 06/29/2007    9:10 AM, 12/07/20 M. Sherlyn Lees, PT, DPT Physical Therapist- [facility] Office Number: [phone]  [facility] [address] [place], [place], [address] Phone: [phone]   Fax:  [phone]  Name: Colleen Nunez MRN: [id number] Date of Birth: Jan 23, 1956

## 2020-12-10 ENCOUNTER — Ambulatory Visit (HOSPITAL_COMMUNITY): Payer: Medicare Other | Admitting: Physical Therapy

## 2020-12-10 ENCOUNTER — Other Ambulatory Visit: Payer: Self-pay

## 2020-12-10 DIAGNOSIS — M6281 Muscle weakness (generalized): Secondary | ICD-10-CM

## 2020-12-10 DIAGNOSIS — R262 Difficulty in walking, not elsewhere classified: Secondary | ICD-10-CM

## 2020-12-10 DIAGNOSIS — M25561 Pain in right knee: Secondary | ICD-10-CM | POA: Diagnosis not present

## 2020-12-10 DIAGNOSIS — M25661 Stiffness of right knee, not elsewhere classified: Secondary | ICD-10-CM

## 2020-12-10 NOTE — Therapy (Addendum)
[facility] [place], [place], [address] Phone: [phone]   Fax:  [phone]  Physical Therapy Treatment  Patient Details  Name: Colleen Nunez MRN: [id number] Date of Birth: [birth year] Referring Provider (PT): Dr. Magda Bernheim, MD   Encounter Date: 12/10/2020   PT End of Session - 12/10/20 0915    Visit Number 7    Number of Visits 12    Date for PT Re-Evaluation 12/31/20    Authorization Type Medicare B 80/20    Progress Note Due on Visit 10    PT Start Time 0834    PT Stop Time 0915    PT Time Calculation (min) 41 min    Activity Tolerance Patient tolerated treatment well;Patient limited by fatigue;No increased pain    Behavior During Therapy WFL for tasks assessed/performed           Past Medical History:  Diagnosis Date  . Anemia   . Anxiety   . Arthritis   . Chronic hip pain   . Chronic knee pain   . Depression    h/o suicide attempts in the past  . GERD (gastroesophageal reflux disease)   . Gout   . Hepatitis C antibody test positive       . Hypertension   . Polysubstance abuse ([place])    h/o  . Recurrent falls   . Renal insufficiency     Past Surgical History:  Procedure Laterality Date  . ABDOMINAL HYSTERECTOMY    . ANTERIOR CERVICAL CORPECTOMY N/A 06/09/2013   Procedure: C4 C5 Corpectomy with C6-7 Anterior cervical fusion/Peek cage/Trestle plate;  Surgeon: Otilio Connors, MD;  Location: [facility];  Service: Neurosurgery;  Laterality: N/A;  Cervical Four, Cervical Five Corpectomy and Cervical six-seven Anterior cervical fusion/Peek cage Three-Five /Trestle Plating Cervical Three to Cervical Seven  . ANTERIOR CERVICAL DECOMP/DISCECTOMY FUSION N/A 08/12/2013   Procedure: Repair of Anterior  Cervical CSF LEAK, lumbar drain placement.;  Surgeon: Floyce Stakes, MD;  Location: [facility];  Service: Neurosurgery;  Laterality: N/A;  . bilateral SOO and appendectomy  2006  . cartilage removal     left  knee  . COLONOSCOPY N/A 07/04/2015   Procedure: COLONOSCOPY;  Surgeon: Rogene Houston, MD;  [facility];  Service: Endoscopy;  Laterality: N/A;  100  . KNEE ARTHROSCOPY    . X-STOP IMPLANTATION      There were no vitals filed for this visit.   Subjective Assessment - 12/10/20 0912    Subjective Pt states No pain today.    Currently in Pain? No/denies                             [facility] Adult PT Treatment/Exercise - 12/10/20 0001      Knee/Hip Exercises: Standing   Heel Raises 20 reps    Knee Flexion Strengthening;Both;2 sets;20 reps    Hip Flexion Limitations alternating with high slow movements    Hip Abduction Both;20 reps    Hip Extension 20 reps    Forward Step Up Both;10 reps;Hand Hold: 1;Step Height: 4"      Knee/Hip Exercises: Seated   Sit to Sand 20 reps;without UE support      Knee/Hip Exercises: Supine   Bridges 20 reps    Bridges Limitations glute squeeze only with knees extended 5" holds    Straight Leg Raises Both;2 sets;10 reps  PT Short Term Goals - 11/19/20 1047      PT SHORT TERM GOAL #1   Title Patient will report at least 25% improvement in symptoms for improved quality of life.    Time 3    Period Weeks    Status New    Target Date 12/10/20      PT SHORT TERM GOAL #2   Title Patient will be independent with HEP in order to improve functional outcomes.    Baseline in development    Time 3    Period Weeks    Status New    Target Date 12/10/20      PT SHORT TERM GOAL #3   Title Pt strength to be improve by 1 grade to be able to go up and down steps in a reciprocol manner    Baseline non-reciprocal with HR and crutch    Time 3    Period Weeks             PT Long Term Goals - 11/19/20 1049      PT LONG TERM GOAL #1   Title Patient will improve on FOTO score to meet predicted outcomes to improve functional independence    Baseline 41% function    Time 6    Period Weeks    Status  New    Target Date 12/31/20      PT LONG TERM GOAL #2   Title Patient will demonstrate improved ambulation as evidenced by distance of 300 ft during 2MWT using least restrictive AD    Baseline 110 ft with single crutch    Time 6    Period Weeks    Status New    Target Date 12/31/20                 Plan - 12/10/20 4098    Clinical Impression Statement OT order still not returned signed at this point.  Continued with LE strengthening.   Pt required cues for general form and posturing to isolate appropriate muscles.  Pt unable to complete bridges due to weakness but able to complete glute sets with knees extended.  Pt pleased with exercise today and able to complete all exercises today without rest break.    Personal Factors and Comorbidities Age;Comorbidity 3+;Time since onset of injury/illness/exacerbation;Past/Current Experience;Fitness    Comorbidities PMH    Examination-Activity Limitations Bed Mobility;Bend;Carry;Lift;Stand;Stairs;Squat;Locomotion Level    Examination-Participation Restrictions Shop;Cleaning;Meal Prep    Stability/Clinical Decision Making Evolving/Moderate complexity    Rehab Potential Fair    PT Frequency 2x / week    PT Duration 6 weeks    PT Treatment/Interventions ADLs/Self Care Home Management;Aquatic Therapy;Biofeedback;Cryotherapy;Electrical Stimulation;DME Instruction;Ultrasound;Moist Heat;Gait training;Stair training;Functional mobility training;Therapeutic activities;Therapeutic exercise;Balance training;Patient/family education;Neuromuscular re-education;Manual techniques;Passive range of motion;Taping;Splinting;Energy conservation;Dry needling;Spinal Manipulations;Joint Manipulations    PT Next Visit Plan Continue strengthening and mobility for pain control.  Follow up on OT consult; progress glute strength. Dynamic movements, e.g. side stepping with weights    PT Home Exercise Plan quad set, SLR, hip add, STS           Patient will benefit from  skilled therapeutic intervention in order to improve the following deficits and impairments:  Abnormal gait,Decreased activity tolerance,Decreased balance,Decreased mobility,Decreased knowledge of use of DME,Decreased endurance,Decreased range of motion,Decreased strength,Difficulty walking,Impaired perceived functional ability,Improper body mechanics,Impaired UE functional use,Pain  Visit Diagnosis: Stiffness of right knee, not elsewhere classified  Difficulty in walking, not elsewhere classified  Muscle weakness (generalized)     Problem List Patient  Active Problem List   Diagnosis Date Noted  . Long term (current) use of opiate analgesic 01/21/2020  . Knee pain 01/21/2020  . Pain radiating to neck 01/21/2020  . Status post total right knee replacement 11/24/2018  . Alcohol use 10/23/2018  . Polyarticular arthritis 10/07/2018  . Low back pain 03/31/2017  . Status post cervical spinal fusion 03/31/2017  . S/P left unicompartmental knee replacement 08/15/2015  . Anemia 08/06/2015  . Stage 3 chronic kidney disease ([place]) 08/06/2015  . Hepatitis C 03/02/2015  . Spondylosis, cervical, with myelopathy 09/21/2013  . Quadriplegia ([place]) 09/21/2013  . Osteoarthritis of left knee 09/21/2013  . Hypokalemia 08/16/2013  . AKI (acute kidney injury) ([place]) 08/16/2013  . Acute kidney failure, unspecified ([place]) 08/16/2013  . UTI (lower urinary tract infection) 08/07/2013  . Nausea & vomiting 08/07/2013  . Chronic kidney disease, stage IV (severe) ([place]) 08/07/2013  . Cervical myelopathy ([place]) 08/07/2013  . Osteoarthrosis, unspecified whether generalized or localized, involving lower leg 06/03/2013  . GERD 06/12/2009  . CHEST PAIN 06/12/2009  . LEG PAIN, BILATERAL 05/28/2009  . OBESITY 12/17/2007  . DYSPHAGIA UNSPECIFIED 12/03/2007  . ANEMIA, NORMOCYTIC 07/13/2007  . ALCOHOL ABUSE 07/13/2007  . RENAL DISEASE, CHRONIC, STAGE II 07/13/2007  . Alcohol abuse 07/13/2007  . ANXIETY 06/29/2007   . DEPRESSION 06/29/2007  . Essential hypertension 06/29/2007  . CONSTIPATION 06/29/2007  . ARTHRITIS 06/29/2007  . MALAISE AND FATIGUE 06/29/2007   Teena Irani, PTA/CLT [phone]  Teena Irani 12/10/2020, 9:30 AM  [facility] [address] [place], [place], [address] Phone: [phone]   Fax:  [phone]  Name: METTA KORANDA MRN: [id number] Date of Birth: 1956/02/03

## 2020-12-12 ENCOUNTER — Other Ambulatory Visit: Payer: Self-pay

## 2020-12-12 ENCOUNTER — Encounter (HOSPITAL_COMMUNITY): Payer: Self-pay | Admitting: Physical Therapy

## 2020-12-12 ENCOUNTER — Ambulatory Visit (HOSPITAL_COMMUNITY): Payer: Medicare Other | Admitting: Physical Therapy

## 2020-12-12 DIAGNOSIS — M25561 Pain in right knee: Secondary | ICD-10-CM | POA: Diagnosis not present

## 2020-12-12 DIAGNOSIS — G8929 Other chronic pain: Secondary | ICD-10-CM

## 2020-12-12 DIAGNOSIS — M6281 Muscle weakness (generalized): Secondary | ICD-10-CM

## 2020-12-12 DIAGNOSIS — R262 Difficulty in walking, not elsewhere classified: Secondary | ICD-10-CM

## 2020-12-12 DIAGNOSIS — M25661 Stiffness of right knee, not elsewhere classified: Secondary | ICD-10-CM

## 2020-12-12 NOTE — Therapy (Signed)
[facility] [place], [place], [address] Phone: [phone]   Fax:  [phone]  Physical Therapy Treatment  Patient Details  Name: Colleen Nunez MRN: [id number] Date of Birth: Apr 06, 1956 Referring Provider (PT): Dr. Magda Bernheim, MD   Encounter Date: 12/12/2020   PT End of Session - 12/12/20 0916    Visit Number 8    Number of Visits 12    Date for PT Re-Evaluation 12/31/20    Authorization Type Medicare B 80/20    Progress Note Due on Visit 10    PT Start Time [id number]   pt arrived and brought back at 915 but had to use restroom   PT Stop Time 0954    PT Time Calculation (min) 29 min    Activity Tolerance Patient tolerated treatment well;Patient limited by fatigue;No increased pain    Behavior During Therapy WFL for tasks assessed/performed           Past Medical History:  Diagnosis Date  . Anemia   . Anxiety   . Arthritis   . Chronic hip pain   . Chronic knee pain   . Depression    h/o suicide attempts in the past  . GERD (gastroesophageal reflux disease)   . Gout   . Hepatitis C antibody test positive       . Hypertension   . Polysubstance abuse ([place])    h/o  . Recurrent falls   . Renal insufficiency     Past Surgical History:  Procedure Laterality Date  . ABDOMINAL HYSTERECTOMY    . ANTERIOR CERVICAL CORPECTOMY N/A 06/09/2013   Procedure: C4 C5 Corpectomy with C6-7 Anterior cervical fusion/Peek cage/Trestle plate;  Surgeon: Otilio Connors, MD;  Location: [facility];  Service: Neurosurgery;  Laterality: N/A;  Cervical Four, Cervical Five Corpectomy and Cervical six-seven Anterior cervical fusion/Peek cage Three-Five /Trestle Plating Cervical Three to Cervical Seven  . ANTERIOR CERVICAL DECOMP/DISCECTOMY FUSION N/A 08/12/2013   Procedure: Repair of Anterior  Cervical CSF LEAK, lumbar drain placement.;  Surgeon: Floyce Stakes, MD;  Location: [facility];  Service: Neurosurgery;  Laterality: N/A;  . bilateral  SOO and appendectomy  2006  . cartilage removal     left knee  . COLONOSCOPY N/A 07/04/2015   Procedure: COLONOSCOPY;  Surgeon: Rogene Houston, MD;  [facility];  Service: Endoscopy;  Laterality: N/A;  100  . KNEE ARTHROSCOPY    . X-STOP IMPLANTATION      There were no vitals filed for this visit.   Subjective Assessment - 12/12/20 0925    Subjective Pt states her knees are doing alright. States she can feel the weather coming. States that she has soreness in but buttocks.    Currently in Pain? Yes    Pain Score 4     Pain Location Knee    Pain Orientation Right    Pain Descriptors / Indicators Sore              [facility] PT Assessment - 12/12/20 0001      Assessment   Medical Diagnosis Right knee pain/TKR    Referring Provider (PT) Dr. Magda Bernheim, MD    Onset Date/Surgical Date 10/04/18                         [facility] Adult PT Treatment/Exercise - 12/12/20 0001      Ambulation/Gait   Ambulation/Gait Yes    Ambulation/Gait Assistance 6:  Modified independent (Device/Increase time)    Ambulation Distance (Feet) 75 Feet    Assistive device Straight cane    Gait Pattern Step-through pattern;Decreased stride length;Antalgic    Ambulation Surface Level;Indoor    Gait velocity decreased    Gait Comments x2 reps      Knee/Hip Exercises: Standing   Other Standing Knee Exercises lateral stepping at table 2x5 B - UE support      Knee/Hip Exercises: Seated   Sit to Sand without UE support;3 sets;10 reps                  PT Education - 12/12/20 0943    Education Details on POC, frequency and duration of PT. On HEP    Person(s) Educated Patient    Methods Explanation    Comprehension Verbalized understanding            PT Short Term Goals - 11/19/20 1047      PT SHORT TERM GOAL #1   Title Patient will report at least 25% improvement in symptoms for improved quality of life.    Time 3    Period Weeks    Status New    Target Date  12/10/20      PT SHORT TERM GOAL #2   Title Patient will be independent with HEP in order to improve functional outcomes.    Baseline in development    Time 3    Period Weeks    Status New    Target Date 12/10/20      PT SHORT TERM GOAL #3   Title Pt strength to be improve by 1 grade to be able to go up and down steps in a reciprocol manner    Baseline non-reciprocal with HR and crutch    Time 3    Period Weeks             PT Long Term Goals - 11/19/20 1049      PT LONG TERM GOAL #1   Title Patient will improve on FOTO score to meet predicted outcomes to improve functional independence    Baseline 41% function    Time 6    Period Weeks    Status New    Target Date 12/31/20      PT LONG TERM GOAL #2   Title Patient will demonstrate improved ambulation as evidenced by distance of 300 ft during 2MWT using least restrictive AD    Baseline 110 ft with single crutch    Time 6    Period Weeks    Status New    Target Date 12/31/20                 Plan - 12/12/20 6073    Clinical Impression Statement Delayed start secondary to patient needing to use bathroom at start of session. Continued with Lower extremity strengthening as tolerated. Patient concerned with current visits, reviewed POC and adjusted frequency of visits to match POC. Patient fatigued end of session and reports of back pain throughout session, allowed for rest breaks to accommodate for this.    Personal Factors and Comorbidities Age;Comorbidity 3+;Time since onset of injury/illness/exacerbation;Past/Current Experience;Fitness    Comorbidities PMH    Examination-Activity Limitations Bed Mobility;Bend;Carry;Lift;Stand;Stairs;Squat;Locomotion Level    Examination-Participation Restrictions Shop;Cleaning;Meal Prep    Stability/Clinical Decision Making Evolving/Moderate complexity    Rehab Potential Fair    PT Frequency 2x / week    PT Duration 6 weeks    PT Treatment/Interventions ADLs/Self Care Home  Management;Aquatic Therapy;Biofeedback;Cryotherapy;Electrical Stimulation;DME Instruction;Ultrasound;Moist Heat;Gait training;Stair training;Functional mobility training;Therapeutic activities;Therapeutic exercise;Balance training;Patient/family education;Neuromuscular re-education;Manual techniques;Passive range of motion;Taping;Splinting;Energy conservation;Dry needling;Spinal Manipulations;Joint Manipulations    PT Next Visit Plan Continue strengthening and mobility for pain control.  Follow up on OT consult; progress glute strength. Dynamic movements, e.g. side stepping with weights    PT Home Exercise Plan quad set, SLR, hip add, STS           Patient will benefit from skilled therapeutic intervention in order to improve the following deficits and impairments:  Abnormal gait,Decreased activity tolerance,Decreased balance,Decreased mobility,Decreased knowledge of use of DME,Decreased endurance,Decreased range of motion,Decreased strength,Difficulty walking,Impaired perceived functional ability,Improper body mechanics,Impaired UE functional use,Pain  Visit Diagnosis: Stiffness of right knee, not elsewhere classified  Difficulty in walking, not elsewhere classified  Muscle weakness (generalized)  Chronic pain of right knee     Problem List Patient Active Problem List   Diagnosis Date Noted  . Long term (current) use of opiate analgesic 01/21/2020  . Knee pain 01/21/2020  . Pain radiating to neck 01/21/2020  . Status post total right knee replacement 11/24/2018  . Alcohol use 10/23/2018  . Polyarticular arthritis 10/07/2018  . Low back pain 03/31/2017  . Status post cervical spinal fusion 03/31/2017  . S/P left unicompartmental knee replacement 08/15/2015  . Anemia 08/06/2015  . Stage 3 chronic kidney disease ([place]) 08/06/2015  . Hepatitis C 03/02/2015  . Spondylosis, cervical, with myelopathy 09/21/2013  . Quadriplegia ([place]) 09/21/2013  . Osteoarthritis of left knee 09/21/2013   . Hypokalemia 08/16/2013  . AKI (acute kidney injury) ([place]) 08/16/2013  . Acute kidney failure, unspecified ([place]) 08/16/2013  . UTI (lower urinary tract infection) 08/07/2013  . Nausea & vomiting 08/07/2013  . Chronic kidney disease, stage IV (severe) ([place]) 08/07/2013  . Cervical myelopathy ([place]) 08/07/2013  . Osteoarthrosis, unspecified whether generalized or localized, involving lower leg 06/03/2013  . GERD 06/12/2009  . CHEST PAIN 06/12/2009  . LEG PAIN, BILATERAL 05/28/2009  . OBESITY 12/17/2007  . DYSPHAGIA UNSPECIFIED 12/03/2007  . ANEMIA, NORMOCYTIC 07/13/2007  . ALCOHOL ABUSE 07/13/2007  . RENAL DISEASE, CHRONIC, STAGE II 07/13/2007  . Alcohol abuse 07/13/2007  . ANXIETY 06/29/2007  . DEPRESSION 06/29/2007  . Essential hypertension 06/29/2007  . CONSTIPATION 06/29/2007  . ARTHRITIS 06/29/2007  . MALAISE AND FATIGUE 06/29/2007   9:55 AM, 12/12/20 Jerene Pitch, DPT Physical Therapy with [facility]  [phone] office  [facility] [address] [place], [place], [address] Phone: [phone]   Fax:  [phone]  Name: Colleen Nunez MRN: [id number] Date of Birth: 1955-11-22

## 2020-12-13 ENCOUNTER — Encounter (HOSPITAL_COMMUNITY): Payer: Medicare Other | Admitting: Physical Therapy

## 2020-12-17 ENCOUNTER — Other Ambulatory Visit: Payer: Self-pay

## 2020-12-17 ENCOUNTER — Ambulatory Visit (HOSPITAL_COMMUNITY): Payer: Medicare Other | Admitting: Physical Therapy

## 2020-12-17 DIAGNOSIS — G8929 Other chronic pain: Secondary | ICD-10-CM

## 2020-12-17 DIAGNOSIS — M6281 Muscle weakness (generalized): Secondary | ICD-10-CM

## 2020-12-17 DIAGNOSIS — R262 Difficulty in walking, not elsewhere classified: Secondary | ICD-10-CM

## 2020-12-17 DIAGNOSIS — M25661 Stiffness of right knee, not elsewhere classified: Secondary | ICD-10-CM

## 2020-12-17 DIAGNOSIS — M25561 Pain in right knee: Secondary | ICD-10-CM | POA: Diagnosis not present

## 2020-12-17 NOTE — Therapy (Signed)
[facility] [place], [place], [address] Phone: [phone]   Fax:  [phone]  Physical Therapy Treatment  Patient Details  Name: Colleen Nunez MRN: [id number] Date of Birth: 31-May-1956 Referring Provider (PT): Dr. Magda Bernheim, MD   Encounter Date: 12/17/2020   PT End of Session - 12/17/20 0914    Visit Number 9    Number of Visits 12    Date for PT Re-Evaluation 12/31/20    Authorization Type Medicare B 80/20    Progress Note Due on Visit 10    PT Start Time 0836    PT Stop Time 0915    PT Time Calculation (min) 39 min    Activity Tolerance Patient tolerated treatment well;Patient limited by fatigue;No increased pain    Behavior During Therapy WFL for tasks assessed/performed           Past Medical History:  Diagnosis Date  . Anemia   . Anxiety   . Arthritis   . Chronic hip pain   . Chronic knee pain   . Depression    h/o suicide attempts in the past  . GERD (gastroesophageal reflux disease)   . Gout   . Hepatitis C antibody test positive       . Hypertension   . Polysubstance abuse ([place])    h/o  . Recurrent falls   . Renal insufficiency     Past Surgical History:  Procedure Laterality Date  . ABDOMINAL HYSTERECTOMY    . ANTERIOR CERVICAL CORPECTOMY N/A 06/09/2013   Procedure: C4 C5 Corpectomy with C6-7 Anterior cervical fusion/Peek cage/Trestle plate;  Surgeon: Otilio Connors, MD;  Location: [facility];  Service: Neurosurgery;  Laterality: N/A;  Cervical Four, Cervical Five Corpectomy and Cervical six-seven Anterior cervical fusion/Peek cage Three-Five /Trestle Plating Cervical Three to Cervical Seven  . ANTERIOR CERVICAL DECOMP/DISCECTOMY FUSION N/A 08/12/2013   Procedure: Repair of Anterior  Cervical CSF LEAK, lumbar drain placement.;  Surgeon: Floyce Stakes, MD;  Location: [facility];  Service: Neurosurgery;  Laterality: N/A;  . bilateral SOO and appendectomy  2006  . cartilage removal     left  knee  . COLONOSCOPY N/A 07/04/2015   Procedure: COLONOSCOPY;  Surgeon: Rogene Houston, MD;  [facility];  Service: Endoscopy;  Laterality: N/A;  100  . KNEE ARTHROSCOPY    . X-STOP IMPLANTATION      There were no vitals filed for this visit.   Subjective Assessment - 12/17/20 0840    Subjective Pt states she slipped up in the ice yesterday and she is sore today.  States 2 people helped her up.  Pt gave a 4/10 for pain but reports general soreness that is " not too bad" today.    Currently in Pain? Yes    Pain Score 4     Pain Location Knee    Pain Orientation Right    Pain Descriptors / Indicators Aching;Sore    Pain Type Chronic pain                             [facility] Adult PT Treatment/Exercise - 12/17/20 0001      Knee/Hip Exercises: Standing   Heel Raises 20 reps    Hip Abduction Both;20 reps    Hip Extension 20 reps    Forward Step Up Both;10 reps;Hand Hold: 1;Step Height: 4"    Other Standing Knee Exercises forward and lateral  stepping at table 10X B - UE support      Knee/Hip Exercises: Seated   Sit to Sand without UE support;10 reps;1 set                    PT Short Term Goals - 11/19/20 1047      PT SHORT TERM GOAL #1   Title Patient will report at least 25% improvement in symptoms for improved quality of life.    Time 3    Period Weeks    Status New    Target Date 12/10/20      PT SHORT TERM GOAL #2   Title Patient will be independent with HEP in order to improve functional outcomes.    Baseline in development    Time 3    Period Weeks    Status New    Target Date 12/10/20      PT SHORT TERM GOAL #3   Title Pt strength to be improve by 1 grade to be able to go up and down steps in a reciprocol manner    Baseline non-reciprocal with HR and crutch    Time 3    Period Weeks             PT Long Term Goals - 11/19/20 1049      PT LONG TERM GOAL #1   Title Patient will improve on FOTO score to meet predicted  outcomes to improve functional independence    Baseline 41% function    Time 6    Period Weeks    Status New    Target Date 12/31/20      PT LONG TERM GOAL #2   Title Patient will demonstrate improved ambulation as evidenced by distance of 300 ft during 2MWT using least restrictive AD    Baseline 110 ft with single crutch    Time 6    Period Weeks    Status New    Target Date 12/31/20                 Plan - 12/17/20 0914    Clinical Impression Statement Pt with recent fall due to icy surface but is only sore.  Able to complete all activities today without c/o pain.  Most challenging was step up exericise with need for 1 UE assist.  Verbal cues needed throughout to maintain upright posturing and for general form with therex.  OT consult unable to be located so new order faxed to Dr Luna Glasgow for pt's Lt hand contractures.    Personal Factors and Comorbidities Age;Comorbidity 3+;Time since onset of injury/illness/exacerbation;Past/Current Experience;Fitness    Comorbidities PMH    Examination-Activity Limitations Bed Mobility;Bend;Carry;Lift;Stand;Stairs;Squat;Locomotion Level    Examination-Participation Restrictions Shop;Cleaning;Meal Prep    Stability/Clinical Decision Making Evolving/Moderate complexity    Rehab Potential Fair    PT Frequency 2x / week    PT Duration 6 weeks    PT Treatment/Interventions ADLs/Self Care Home Management;Aquatic Therapy;Biofeedback;Cryotherapy;Electrical Stimulation;DME Instruction;Ultrasound;Moist Heat;Gait training;Stair training;Functional mobility training;Therapeutic activities;Therapeutic exercise;Balance training;Patient/family education;Neuromuscular re-education;Manual techniques;Passive range of motion;Taping;Splinting;Energy conservation;Dry needling;Spinal Manipulations;Joint Manipulations    PT Next Visit Plan Continue strengthening and mobility for pain control.  Follow up on OT consult; progress glute strength. Dynamic movements, e.g.  side stepping with weights.  completed 10th visit PN next session.    PT Home Exercise Plan quad set, SLR, hip add, STS           Patient will benefit from skilled therapeutic intervention in order to improve  the following deficits and impairments:  Abnormal gait,Decreased activity tolerance,Decreased balance,Decreased mobility,Decreased knowledge of use of DME,Decreased endurance,Decreased range of motion,Decreased strength,Difficulty walking,Impaired perceived functional ability,Improper body mechanics,Impaired UE functional use,Pain  Visit Diagnosis: Difficulty in walking, not elsewhere classified  Muscle weakness (generalized)  Stiffness of right knee, not elsewhere classified  Chronic pain of right knee     Problem List Patient Active Problem List   Diagnosis Date Noted  . Long term (current) use of opiate analgesic 01/21/2020  . Knee pain 01/21/2020  . Pain radiating to neck 01/21/2020  . Status post total right knee replacement 11/24/2018  . Alcohol use 10/23/2018  . Polyarticular arthritis 10/07/2018  . Low back pain 03/31/2017  . Status post cervical spinal fusion 03/31/2017  . S/P left unicompartmental knee replacement 08/15/2015  . Anemia 08/06/2015  . Stage 3 chronic kidney disease ([place]) 08/06/2015  . Hepatitis C 03/02/2015  . Spondylosis, cervical, with myelopathy 09/21/2013  . Quadriplegia ([place]) 09/21/2013  . Osteoarthritis of left knee 09/21/2013  . Hypokalemia 08/16/2013  . AKI (acute kidney injury) ([place]) 08/16/2013  . Acute kidney failure, unspecified ([place]) 08/16/2013  . UTI (lower urinary tract infection) 08/07/2013  . Nausea & vomiting 08/07/2013  . Chronic kidney disease, stage IV (severe) ([place]) 08/07/2013  . Cervical myelopathy ([place]) 08/07/2013  . Osteoarthrosis, unspecified whether generalized or localized, involving lower leg 06/03/2013  . GERD 06/12/2009  . CHEST PAIN 06/12/2009  . LEG PAIN, BILATERAL 05/28/2009  . OBESITY 12/17/2007  .  DYSPHAGIA UNSPECIFIED 12/03/2007  . ANEMIA, NORMOCYTIC 07/13/2007  . ALCOHOL ABUSE 07/13/2007  . RENAL DISEASE, CHRONIC, STAGE II 07/13/2007  . Alcohol abuse 07/13/2007  . ANXIETY 06/29/2007  . DEPRESSION 06/29/2007  . Essential hypertension 06/29/2007  . CONSTIPATION 06/29/2007  . ARTHRITIS 06/29/2007  . MALAISE AND FATIGUE 06/29/2007   Teena Irani, PTA/CLT [phone]  Teena Irani 12/17/2020, 9:22 AM  [facility] [address] [place], [place], [address] Phone: [phone]   Fax:  [phone]  Name: Colleen Nunez MRN: [id number] Date of Birth: Sep 11, 1956

## 2020-12-18 ENCOUNTER — Ambulatory Visit: Payer: Medicare Other | Admitting: Orthopaedic Surgery

## 2020-12-19 ENCOUNTER — Encounter (HOSPITAL_COMMUNITY): Payer: Self-pay

## 2020-12-19 ENCOUNTER — Ambulatory Visit (HOSPITAL_COMMUNITY): Payer: Medicare Other | Attending: Orthopedic Surgery

## 2020-12-19 ENCOUNTER — Other Ambulatory Visit: Payer: Self-pay

## 2020-12-19 DIAGNOSIS — M6281 Muscle weakness (generalized): Secondary | ICD-10-CM | POA: Diagnosis present

## 2020-12-19 DIAGNOSIS — R262 Difficulty in walking, not elsewhere classified: Secondary | ICD-10-CM | POA: Insufficient documentation

## 2020-12-19 DIAGNOSIS — M25661 Stiffness of right knee, not elsewhere classified: Secondary | ICD-10-CM | POA: Insufficient documentation

## 2020-12-19 DIAGNOSIS — M25642 Stiffness of left hand, not elsewhere classified: Secondary | ICD-10-CM | POA: Diagnosis present

## 2020-12-19 DIAGNOSIS — G8929 Other chronic pain: Secondary | ICD-10-CM | POA: Diagnosis present

## 2020-12-19 DIAGNOSIS — M25561 Pain in right knee: Secondary | ICD-10-CM | POA: Diagnosis present

## 2020-12-19 DIAGNOSIS — R278 Other lack of coordination: Secondary | ICD-10-CM | POA: Insufficient documentation

## 2020-12-19 DIAGNOSIS — R29898 Other symptoms and signs involving the musculoskeletal system: Secondary | ICD-10-CM | POA: Diagnosis present

## 2020-12-19 NOTE — Therapy (Signed)
[facility] [address] [place], [place], [address] Phone: [phone]   Fax:  [phone]  Physical Therapy Treatment and Progress Note  Patient Details  Name: Colleen Nunez MRN: [id number] Date of Birth: Jun 05, 1956 Referring Provider (PT): Dr. Magda Bernheim, MD  Progress Note Reporting Period 11/19/20 to 12/19/20  See note below for Objective Data and Assessment of Progress/Goals.      Encounter Date: 12/19/2020   PT End of Session - 12/19/20 0815    Visit Number 10    Number of Visits 12    Date for PT Re-Evaluation 12/31/20    Authorization Type Medicare B 80/20    Progress Note Due on Visit 20    PT Start Time [phone]    PT Stop Time 0901    PT Time Calculation (min) 45 min    Activity Tolerance Patient tolerated treatment well;Patient limited by fatigue;No increased pain    Behavior During Therapy WFL for tasks assessed/performed           Past Medical History:  Diagnosis Date  . Anemia   . Anxiety   . Arthritis   . Chronic hip pain   . Chronic knee pain   . Depression    h/o suicide attempts in the past  . GERD (gastroesophageal reflux disease)   . Gout   . Hepatitis C antibody test positive       . Hypertension   . Polysubstance abuse ([place])    h/o  . Recurrent falls   . Renal insufficiency     Past Surgical History:  Procedure Laterality Date  . ABDOMINAL HYSTERECTOMY    . ANTERIOR CERVICAL CORPECTOMY N/A 06/09/2013   Procedure: C4 C5 Corpectomy with C6-7 Anterior cervical fusion/Peek cage/Trestle plate;  Surgeon: Otilio Connors, MD;  Location: [facility];  Service: Neurosurgery;  Laterality: N/A;  Cervical Four, Cervical Five Corpectomy and Cervical six-seven Anterior cervical fusion/Peek cage Three-Five /Trestle Plating Cervical Three to Cervical Seven  . ANTERIOR CERVICAL DECOMP/DISCECTOMY FUSION N/A 08/12/2013   Procedure: Repair of Anterior  Cervical CSF LEAK, lumbar drain placement.;  Surgeon: Floyce Stakes, MD;  Location: [facility];  Service: Neurosurgery;  Laterality: N/A;  . bilateral SOO and appendectomy  2006  . cartilage removal     left knee  . COLONOSCOPY N/A 07/04/2015   Procedure: COLONOSCOPY;  Surgeon: Rogene Houston, MD;  [facility];  Service: Endoscopy;  Laterality: N/A;  100  . KNEE ARTHROSCOPY    . X-STOP IMPLANTATION      There were no vitals filed for this visit.   Subjective Assessment - 12/19/20 0820    Subjective Patient reports that she is overall feeling better and has noted an easier time walking around and has been more active at home as a result and has been able to walk outside with the use of cane. Pt reports feeling feeling 25% or more improved and reports decrease in use of pain medicine                             [facility] Adult PT Treatment/Exercise - 12/19/20 0001      Ambulation/Gait   Ambulation/Gait Yes    Ambulation/Gait Assistance 6: Modified independent (Device/Increase time)    Ambulation Distance (Feet) 250 Feet    Assistive device Straight cane    Gait Pattern Step-through pattern    Ambulation Surface Level;Indoor  Stairs Yes    Stairs Assistance 6: Modified independent (Device/Increase time)    Stair Management Technique Alternating pattern;Two rails    Number of Stairs 4    Height of Stairs 6    Gait Comments 2MWT      Knee/Hip Exercises: Aerobic   Nustep level 4 x 8 min to improve activity tolerance      Knee/Hip Exercises: Standing   Knee Flexion Strengthening;Both;3 sets;10 reps    Knee Flexion Limitations 5 lbs    Hip Flexion Stengthening;Both    Hip Flexion Limitations stair taps x 2 min with 5 lbs    Other Standing Knee Exercises static standing on airex 4x30 sec for postural awareness    Other Standing Knee Exercises side stepping with 5 lbs ankle weights x 2 min      Knee/Hip Exercises: Seated   Long Arc Quad Strengthening;Both;3 sets;10 reps    Long Arc Quad Weight 5 lbs.                   PT Education - 12/19/20 0821    Education Details Pt education on updated POC respective to her STG/LTG that were established at start of care. Education on benefit in acquiring ankle weights and places to locate them to maintain PRE for LE    Person(s) Educated Patient    Methods Explanation    Comprehension Verbalized understanding            PT Short Term Goals - 12/19/20 2703      PT SHORT TERM GOAL #1   Title Patient will report at least 25% improvement in symptoms for improved quality of life.    Time 3    Period Weeks    Status Achieved    Target Date 12/10/20      PT SHORT TERM GOAL #2   Title Patient will be independent with HEP in order to improve functional outcomes.    Baseline occasional verbal cues in sequence and progression    Time 3    Period Weeks    Status On-going    Target Date 12/10/20      PT SHORT TERM GOAL #3   Title Pt strength to be improve by 1 grade to be able to go up and down steps in a reciprocol manner    Baseline reciprocal with BHR    Time 3    Period Weeks    Status Achieved             PT Long Term Goals - 12/19/20 5009      PT LONG TERM GOAL #1   Title Patient will improve on FOTO score to meet predicted outcomes to improve functional independence    Baseline 41% function    Time 6    Period Weeks    Status New      PT LONG TERM GOAL #2   Title Patient will demonstrate improved ambulation as evidenced by distance of 300 ft during 2MWT using least restrictive AD    Baseline 250 ft with cane    Time 6    Period Weeks    Status On-going                 Plan - 12/19/20 0911    Clinical Impression Statement Patient demonstrates and self-reports overall improvements since start of care and has met 2/3 STG at this time and is progressing well with LTG demonstrating significant improvements in gait velocity and stability as evidenced  by distance of 250 ft during 2MWT vs 110 ft at start of care.  Patient  demonstrates good compliance with HEP activities with good recitation of exercises and occasional cues for sequence and execution/form.    Personal Factors and Comorbidities Age;Comorbidity 3+;Time since onset of injury/illness/exacerbation;Past/Current Experience;Fitness    Comorbidities PMH    Examination-Activity Limitations Bed Mobility;Bend;Carry;Lift;Stand;Stairs;Squat;Locomotion Level    Examination-Participation Restrictions Shop;Cleaning;Meal Prep    Stability/Clinical Decision Making Evolving/Moderate complexity    Rehab Potential Fair    PT Frequency 2x / week    PT Duration 6 weeks    PT Treatment/Interventions ADLs/Self Care Home Management;Aquatic Therapy;Biofeedback;Cryotherapy;Electrical Stimulation;DME Instruction;Ultrasound;Moist Heat;Gait training;Stair training;Functional mobility training;Therapeutic activities;Therapeutic exercise;Balance training;Patient/family education;Neuromuscular re-education;Manual techniques;Passive range of motion;Taping;Splinting;Energy conservation;Dry needling;Spinal Manipulations;Joint Manipulations    PT Next Visit Plan Continue strengthening and mobility for pain control.  Follow up on OT consult; progress glute strength. Dynamic movements, e.g. side stepping with weights.  HEP review to prepare for D/C. FOTO!    PT Home Exercise Plan quad set, SLR, hip add, STS           Patient will benefit from skilled therapeutic intervention in order to improve the following deficits and impairments:  Abnormal gait,Decreased activity tolerance,Decreased balance,Decreased mobility,Decreased knowledge of use of DME,Decreased endurance,Decreased range of motion,Decreased strength,Difficulty walking,Impaired perceived functional ability,Improper body mechanics,Impaired UE functional use,Pain  Visit Diagnosis: Difficulty in walking, not elsewhere classified  Muscle weakness (generalized)  Stiffness of right knee, not elsewhere classified  Chronic pain of  right knee     Problem List Patient Active Problem List   Diagnosis Date Noted  . Long term (current) use of opiate analgesic 01/21/2020  . Knee pain 01/21/2020  . Pain radiating to neck 01/21/2020  . Status post total right knee replacement 11/24/2018  . Alcohol use 10/23/2018  . Polyarticular arthritis 10/07/2018  . Low back pain 03/31/2017  . Status post cervical spinal fusion 03/31/2017  . S/P left unicompartmental knee replacement 08/15/2015  . Anemia 08/06/2015  . Stage 3 chronic kidney disease ([place]) 08/06/2015  . Hepatitis C 03/02/2015  . Spondylosis, cervical, with myelopathy 09/21/2013  . Quadriplegia ([place]) 09/21/2013  . Osteoarthritis of left knee 09/21/2013  . Hypokalemia 08/16/2013  . AKI (acute kidney injury) ([place]) 08/16/2013  . Acute kidney failure, unspecified ([place]) 08/16/2013  . UTI (lower urinary tract infection) 08/07/2013  . Nausea & vomiting 08/07/2013  . Chronic kidney disease, stage IV (severe) ([place]) 08/07/2013  . Cervical myelopathy ([place]) 08/07/2013  . Osteoarthrosis, unspecified whether generalized or localized, involving lower leg 06/03/2013  . GERD 06/12/2009  . CHEST PAIN 06/12/2009  . LEG PAIN, BILATERAL 05/28/2009  . OBESITY 12/17/2007  . DYSPHAGIA UNSPECIFIED 12/03/2007  . ANEMIA, NORMOCYTIC 07/13/2007  . ALCOHOL ABUSE 07/13/2007  . RENAL DISEASE, CHRONIC, STAGE II 07/13/2007  . Alcohol abuse 07/13/2007  . ANXIETY 06/29/2007  . DEPRESSION 06/29/2007  . Essential hypertension 06/29/2007  . CONSTIPATION 06/29/2007  . ARTHRITIS 06/29/2007  . MALAISE AND FATIGUE 06/29/2007    9:16 AM, 12/19/20 M. Sherlyn Lees, PT, DPT Physical Therapist- [facility] Office Number: [phone]  [facility] [address] [place], [place], [address] Phone: [phone]   Fax:  [phone]  Name: Colleen Nunez MRN: [id number] Date of Birth: [birth year]

## 2020-12-21 ENCOUNTER — Encounter (HOSPITAL_COMMUNITY): Payer: Medicare Other

## 2020-12-24 ENCOUNTER — Ambulatory Visit (HOSPITAL_COMMUNITY): Payer: Medicare Other

## 2020-12-26 ENCOUNTER — Other Ambulatory Visit: Payer: Self-pay

## 2020-12-26 ENCOUNTER — Ambulatory Visit (HOSPITAL_COMMUNITY): Payer: Medicare Other | Admitting: Physical Therapy

## 2020-12-26 DIAGNOSIS — R262 Difficulty in walking, not elsewhere classified: Secondary | ICD-10-CM

## 2020-12-26 DIAGNOSIS — M25661 Stiffness of right knee, not elsewhere classified: Secondary | ICD-10-CM

## 2020-12-26 DIAGNOSIS — M6281 Muscle weakness (generalized): Secondary | ICD-10-CM

## 2020-12-26 NOTE — Therapy (Signed)
[place], [place], [address] Phone: [phone]   Fax:  [phone]  Physical Therapy Treatment Discharge  Patient Details  Name: Colleen Nunez MRN: [id number] Date of Birth: [birth year] Referring Provider (PT): Dr. Magda Bernheim, MD   Encounter Date: 12/26/2020   PT End of Session - 12/26/20 0849    Visit Number 11    Number of Visits 12    Date for PT Re-Evaluation 12/31/20    Authorization Type Medicare B 80/20    Progress Note Due on Visit 20    PT Start Time 0835    PT Stop Time 0858    PT Time Calculation (min) 23 min    Activity Tolerance Patient tolerated treatment well;Patient limited by fatigue;No increased pain    Behavior During Therapy WFL for tasks assessed/performed           Past Medical History:  Diagnosis Date  . Anemia   . Anxiety   . Arthritis   . Chronic hip pain   . Chronic knee pain   . Depression    h/o suicide attempts in the past  . GERD (gastroesophageal reflux disease)   . Gout   . Hepatitis C antibody test positive       . Hypertension   . Polysubstance abuse ([place])    h/o  . Recurrent falls   . Renal insufficiency     Past Surgical History:  Procedure Laterality Date  . ABDOMINAL HYSTERECTOMY    . ANTERIOR CERVICAL CORPECTOMY N/A 06/09/2013   Procedure: C4 C5 Corpectomy with C6-7 Anterior cervical fusion/Peek cage/Trestle plate;  Surgeon: Otilio Connors, MD;  Location: [facility];  Service: Neurosurgery;  Laterality: N/A;  Cervical Four, Cervical Five Corpectomy and Cervical six-seven Anterior cervical fusion/Peek cage Three-Five /Trestle Plating Cervical Three to Cervical Seven  . ANTERIOR CERVICAL DECOMP/DISCECTOMY FUSION N/A 08/12/2013   Procedure: Repair of Anterior  Cervical CSF LEAK, lumbar drain placement.;  Surgeon: Floyce Stakes, MD;  Location: [facility];  Service: Neurosurgery;  Laterality: N/A;  . bilateral SOO and appendectomy  2006  . cartilage removal      left knee  . COLONOSCOPY N/A 07/04/2015   Procedure: COLONOSCOPY;  Surgeon: Rogene Houston, MD;  [facility];  Service: Endoscopy;  Laterality: N/A;  100  . KNEE ARTHROSCOPY    . X-STOP IMPLANTATION      There were no vitals filed for this visit.   Subjective Assessment - 12/26/20 0838    Subjective Pt reports compliance with HEP and continues to use her cane for outdoor ambulation.   States she begins OT on Friday.    Currently in Pain? Yes    Pain Score 4     Pain Location Hip    Pain Orientation Right    Pain Descriptors / Indicators Aching;Throbbing;Sore              [facility] PT Assessment - 12/26/20 0001      Observation/Other Assessments   Focus on Therapeutic Outcomes (FOTO)  55% FS (was 41%)      Ambulation/Gait   Ambulation/Gait Yes    Ambulation/Gait Assistance 6: Modified independent (Device/Increase time)    Ambulation Distance (Feet) 339 Feet    Assistive device Straight cane    Gait Comments 2MWT  PT Education - 12/26/20 0855    Education Details Reveiwed HEP and discussed progression following discharge.  Reviewed all retested measures and progress with patient.    Person(s) Educated Patient    Methods Explanation    Comprehension Verbalized understanding            PT Short Term Goals - 12/19/20 3295      PT SHORT TERM GOAL #1   Title Patient will report at least 25% improvement in symptoms for improved quality of life.    Time 3    Period Weeks    Status Achieved    Target Date 12/10/20      PT SHORT TERM GOAL #2   Title Patient will be independent with HEP in order to improve functional outcomes.    Baseline occasional verbal cues in sequence and progression    Time 3    Period Weeks    Status On-going    Target Date 12/10/20      PT SHORT TERM GOAL #3   Title Pt strength to be improve by 1 grade to be able to go up and down steps in a reciprocol manner    Baseline  reciprocal with BHR    Time 3    Period Weeks    Status Achieved             PT Long Term Goals - 12/26/20 0849      PT LONG TERM GOAL #1   Title Patient will improve on FOTO score to meet predicted outcomes to improve functional independence    Baseline 41% function    Time 6    Period Weeks    Status Achieved      PT LONG TERM GOAL #2   Title Patient will demonstrate improved ambulation as evidenced by distance of 300 ft during 2MWT using least restrictive AD    Baseline 250 ft with cane    Time 6    Period Weeks    Status Achieved                 Plan - 12/26/20 0900    Clinical Impression Statement Pt returns today for final visit/remaining test measures.  Completed 2MWT with 339 feet completed using SPC.  FOTO completed with score of 55% functional status (was 41%) indicating overall improvement.  Pt has made good progress with PT and is completing HEP without questions or difficulty.  Encouraged to continue walking in addition to her therex.  Pt verbalized understanding and is agreeable to discharge at this time.    Personal Factors and Comorbidities Age;Comorbidity 3+;Time since onset of injury/illness/exacerbation;Past/Current Experience;Fitness    Comorbidities PMH    Examination-Activity Limitations Bed Mobility;Bend;Carry;Lift;Stand;Stairs;Squat;Locomotion Level    Examination-Participation Restrictions Shop;Cleaning;Meal Prep    Stability/Clinical Decision Making Evolving/Moderate complexity    Rehab Potential Fair    PT Frequency 2x / week    PT Duration 6 weeks    PT Treatment/Interventions ADLs/Self Care Home Management;Aquatic Therapy;Biofeedback;Cryotherapy;Electrical Stimulation;DME Instruction;Ultrasound;Moist Heat;Gait training;Stair training;Functional mobility training;Therapeutic activities;Therapeutic exercise;Balance training;Patient/family education;Neuromuscular re-education;Manual techniques;Passive range of motion;Taping;Splinting;Energy  conservation;Dry needling;Spinal Manipulations;Joint Manipulations    PT Next Visit Plan Discharge to HEP.    PT Home Exercise Plan quad set, SLR, hip add, STS  2/9: step ups, walking program, long arc quads           Patient will benefit from skilled therapeutic intervention in order to improve the following deficits and impairments:  Abnormal gait,Decreased activity tolerance,Decreased balance,Decreased mobility,Decreased knowledge of  use of DME,Decreased endurance,Decreased range of motion,Decreased strength,Difficulty walking,Impaired perceived functional ability,Improper body mechanics,Impaired UE functional use,Pain  Visit Diagnosis: Difficulty in walking, not elsewhere classified  Muscle weakness (generalized)  Stiffness of right knee, not elsewhere classified     Problem List Patient Active Problem List   Diagnosis Date Noted  . Long term (current) use of opiate analgesic 01/21/2020  . Knee pain 01/21/2020  . Pain radiating to neck 01/21/2020  . Status post total right knee replacement 11/24/2018  . Alcohol use 10/23/2018  . Polyarticular arthritis 10/07/2018  . Low back pain 03/31/2017  . Status post cervical spinal fusion 03/31/2017  . S/P left unicompartmental knee replacement 08/15/2015  . Anemia 08/06/2015  . Stage 3 chronic kidney disease ([place]) 08/06/2015  . Hepatitis C 03/02/2015  . Spondylosis, cervical, with myelopathy 09/21/2013  . Quadriplegia ([place]) 09/21/2013  . Osteoarthritis of left knee 09/21/2013  . Hypokalemia 08/16/2013  . AKI (acute kidney injury) ([place]) 08/16/2013  . Acute kidney failure, unspecified ([place]) 08/16/2013  . UTI (lower urinary tract infection) 08/07/2013  . Nausea & vomiting 08/07/2013  . Chronic kidney disease, stage IV (severe) ([place]) 08/07/2013  . Cervical myelopathy ([place]) 08/07/2013  . Osteoarthrosis, unspecified whether generalized or localized, involving lower leg 06/03/2013  . GERD 06/12/2009  . CHEST PAIN 06/12/2009  . LEG  PAIN, BILATERAL 05/28/2009  . OBESITY 12/17/2007  . DYSPHAGIA UNSPECIFIED 12/03/2007  . ANEMIA, NORMOCYTIC 07/13/2007  . ALCOHOL ABUSE 07/13/2007  . RENAL DISEASE, CHRONIC, STAGE II 07/13/2007  . Alcohol abuse 07/13/2007  . ANXIETY 06/29/2007  . DEPRESSION 06/29/2007  . Essential hypertension 06/29/2007  . CONSTIPATION 06/29/2007  . ARTHRITIS 06/29/2007  . MALAISE AND FATIGUE 06/29/2007   Teena Irani, PTA/CLT [phone]  Teena Irani 12/26/2020, 9:04 AM  [facility] [address] [place], [place], [address] Phone: [phone]   Fax:  [phone]  Name: Colleen Nunez MRN: [id number] Date of Birth: 04-12-1956

## 2020-12-28 ENCOUNTER — Encounter (HOSPITAL_COMMUNITY): Payer: Medicare Other

## 2020-12-28 ENCOUNTER — Other Ambulatory Visit: Payer: Self-pay

## 2020-12-28 ENCOUNTER — Encounter (HOSPITAL_COMMUNITY): Payer: Self-pay | Admitting: Specialist

## 2020-12-28 ENCOUNTER — Ambulatory Visit (HOSPITAL_COMMUNITY): Payer: Medicare Other | Admitting: Specialist

## 2020-12-28 DIAGNOSIS — R29898 Other symptoms and signs involving the musculoskeletal system: Secondary | ICD-10-CM

## 2020-12-28 DIAGNOSIS — R262 Difficulty in walking, not elsewhere classified: Secondary | ICD-10-CM | POA: Diagnosis not present

## 2020-12-28 DIAGNOSIS — R278 Other lack of coordination: Secondary | ICD-10-CM

## 2020-12-28 DIAGNOSIS — M25642 Stiffness of left hand, not elsewhere classified: Secondary | ICD-10-CM

## 2020-12-28 NOTE — Patient Instructions (Signed)
AROM: DIP Flexion / Extension   Pinch middle knuckle of ________ finger of right hand to prevent bending. Bend end knuckle until stretch is felt. Hold ____ seconds. Relax. Straighten finger as far as possible. Repeat ____ times per set. Do ____ sets per session. Do ____ sessions per day.  Copyright  VHI. All rights reserved.    AROM: PIP Flexion / Extension   Pinch bottom knuckle of ________ finger of right hand to prevent bending. Actively bend middle knuckle until stretch is felt. Hold ____ seconds. Relax. Straighten finger as far as possible. Repeat ____ times per set. Do ____ sets per session. Do ____ sessions per day.  Copyright  VHI. All rights reserved.   AROM: Finger Flexion / Extension   Actively bend fingers of right hand. Start with knuckles furthest from palm, and slowly make a fist. Hold ____ seconds. Relax. Then straighten fingers as far as possible. Repeat ____ times per set. Do ____ sets per session. Do ____ sessions per day.  Copyright  VHI. All rights reserved.   Paper Crumpling Exercise   Begin with right palm down on piece of paper. Maintaining contact between surface and heel of hand, crumple paper into a ball. Repeat ____ times per set. Do ____ sets per session. Do ____ sessions per day.  Copyright  VHI. All rights reserved.     Towel Roll Squeeze   With right forearm resting on surface, gently squeeze towel. Repeat ____ times per set. Do ____ sets per session. Do ____ sessions per day.  Copyright  VHI. All rights reserved.   Abduction / Adduction (Active)    With hand flat on table, spread all fingers apart, then bring them together as close as possible. Repeat ____ times. Do ____ sessions per day.  Copyright  VHI. All rights reserved.  AROM: Thumb Abduction / Adduction   Actively pull right thumb away from palm as far as possible. Hold ____ seconds. Then bring thumb back to touch fingers. Try not to bend fingers toward thumb. Repeat  ____ times per set. Do ____ sets per session. Do ____ sessions per day.  Copyright  VHI. All rights reserved.

## 2020-12-28 NOTE — Therapy (Signed)
[name], [place], [address] Phone: [phone]   Fax:  [phone]  Occupational Therapy Evaluation  Patient Details  Name: Colleen Nunez MRN: [id number] Date of Birth: Oct 31, 1956 Referring Provider (OT): Dr. Sanjuana Kava   Encounter Date: 12/28/2020   OT End of Session - 12/28/20 0957    Visit Number 1    Number of Visits 8    Date for OT Re-Evaluation 01/25/21    Authorization Type Medicare    Progress Note Due on Visit 10    OT Start Time 0815    OT Stop Time 0850    OT Time Calculation (min) 35 min    Activity Tolerance Patient tolerated treatment well    Behavior During Therapy [facility] for tasks assessed/performed           Past Medical History:  Diagnosis Date  . Anemia   . Anxiety   . Arthritis   . Chronic hip pain   . Chronic knee pain   . Depression    h/o suicide attempts in the past  . GERD (gastroesophageal reflux disease)   . Gout   . Hepatitis C antibody test positive       . Hypertension   . Polysubstance abuse ([place])    h/o  . Recurrent falls   . Renal insufficiency     Past Surgical History:  Procedure Laterality Date  . ABDOMINAL HYSTERECTOMY    . ANTERIOR CERVICAL CORPECTOMY N/A 06/09/2013   Procedure: C4 C5 Corpectomy with C6-7 Anterior cervical fusion/Peek cage/Trestle plate;  Surgeon: Otilio Connors, MD;  Location: [facility];  Service: Neurosurgery;  Laterality: N/A;  Cervical Four, Cervical Five Corpectomy and Cervical six-seven Anterior cervical fusion/Peek cage Three-Five /Trestle Plating Cervical Three to Cervical Seven  . ANTERIOR CERVICAL DECOMP/DISCECTOMY FUSION N/A 08/12/2013   Procedure: Repair of Anterior  Cervical CSF LEAK, lumbar drain placement.;  Surgeon: Floyce Stakes, MD;  Location: [facility];  Service: Neurosurgery;  Laterality: N/A;  . bilateral SOO and appendectomy  2006  . cartilage removal     left knee  . COLONOSCOPY N/A 07/04/2015   Procedure:  COLONOSCOPY;  Surgeon: Rogene Houston, MD;  [facility];  Service: Endoscopy;  Laterality: N/A;  100  . KNEE ARTHROSCOPY    . X-STOP IMPLANTATION      There were no vitals filed for this visit.   Subjective Assessment - 12/28/20 0942    Subjective  S:  I want to be able to straighten these fingers out so that I can braid my own hair.    Pertinent History Ms. Kanan states that her left hand began to "draw up" about 2 years ago after she had her 2 knee replacements.  She is unsure of etiology - stating that physicians have said it might be arthritis.  She had recieved OT in the past for the same deficit and states the OT made a splint for her, however, she does not wear the splint because she can't use her hand when she wears it.    Special Tests DASH completed    Patient Stated Goals I want to be able braid my hair    Currently in Pain? Yes    Pain Score 6     Pain Location Hand    Pain Orientation Left    Pain Descriptors / Indicators Aching    Pain Type Chronic pain    Pain Radiating Towards finger tips  Pain Onset More than a month ago    Pain Frequency Intermittent    Pain Relieving Factors rest, medication    Effect of Pain on Daily Activities max             [facility] OT Assessment - 12/28/20 0001      Assessment   Medical Diagnosis Left Hnad Contracture    Referring Provider (OT) Dr. Sanjuana Kava    Onset Date/Surgical Date --   chronic   Hand Dominance Right    Next MD Visit unknown    Prior Therapy one time OT visit for splint fabrication in 2020      Precautions   Precautions Fall      Restrictions   Weight Bearing Restrictions No      Balance Screen   Has the patient fallen in the past 6 months Yes    How many times? unknown    Has the patient had a decrease in activity level because of a fear of falling?  No    Is the patient reluctant to leave their home because of a fear of falling?  No      Home  Environment   Family/patient expects to be  discharged to: Private residence    Living Arrangements Alone    Available Help at Discharge Family      Prior Function   Level of Independence Independent with basic ADLs;Independent with household mobility with device;Needs assistance with ADLs    Vocation Retired    Leisure spending time with boyfriend      ADL   ADL comments completes most adls with dominant right hand, can use left hand as a gross assist, however unable to open hand to functionally grasp, pinch, or manipulate items.  unable to braid her hair or complete other adls that require bue use      Written Expression   Dominant Hand Right      Vision - History   Baseline Vision No visual deficits      Cognition   Overall Cognitive Status Within Functional Limits for tasks assessed      Observation/Other Assessments   Quick DASH  56.82%      Sensation   Light Touch Appears Intact      Coordination   9 Hole Peg Test Right;Left    Right 9 Hole Peg Test 50.11"    Left 9 Hole Peg Test able to place 2 pegs independently, therapist then held pegs upright and patient picked up with pincer grasp, she then removed pegs independently.  1 min 14"      AROM   AROM Assessment Site Wrist    Right/Left Wrist Left    Left Wrist Extension 58 Degrees    Left Wrist Flexion 46 Degrees      Left Hand AROM   L Thumb MCP 0-60 50 Degrees   extension 0   L Thumb IP 0-80 50 Degrees   extension 0   L Index  MCP 0-90 70 Degrees   Extension 0   L  Index PIP 0-100 100 Degrees   Extension 80   L  Index DIP 0-70 80 Degrees   Extension 20   L Long  MCP 0-90 70 Degrees   extension 0   L  Long PIP 0-100 100 Degrees   extension 80   L  Long DIP 0-70 60 Degrees   extension 50    L  Ring  MCP 0-90 70 Degrees   extension 0  L  Ring PIP 0-100 90 Degrees   extension 60   L  Ring DIP 0-70 70 Degrees   extension 30   L Little  MCP 0-90 0 Degrees   extension 0   L  Little PIP 0-100 90 Degrees   extension 80   L  Little DIP 0-70 80 Degrees    extension 40     Hand Function   Right Hand Grip (lbs) 20    Right Hand Lateral Pinch 3 lbs    Right Hand 3 Point Pinch 2 lbs    Left Hand Grip (lbs) 5    Left Hand Lateral Pinch 0.25 lbs    Left 3 point pinch 1 lbs    Comment Left Hand P/ROM is 50%               [name] - 12/28/20 0001    Open a tight or new jar Unable    Do heavy household chores (wash walls, wash floors) Unable    Carry a shopping bag or briefcase Moderate difficulty    Wash your back No difficulty    Use a knife to cut food No difficulty    Recreational activities in which you take some force or impact through your arm, shoulder, or hand (golf, hammering, tennis) Unable    During the past week, to what extent has your arm, shoulder or hand problem interfered with your normal social activities with family, friends, neighbors, or groups? Not at all    During the past week, to what extent has your arm, shoulder or hand problem limited your work or other regular daily activities Modererately    Arm, shoulder, or hand pain. Extreme    Tingling (pins and needles) in your arm, shoulder, or hand Severe    Difficulty Sleeping Moderate difficulty    DASH Score 56.82 %                      OT Education - 12/28/20 0956    Education Details hand a/rom exercises    Person(s) Educated Patient    Methods Explanation;Demonstration;Handout    Comprehension Verbalized understanding;Returned demonstration            OT Short Term Goals - 12/28/20 1004      OT SHORT TERM GOAL #1   Title Patient will be provided with a fabricated hand splint and verbalize and demonstrate all precautions, wearing schedule, care management, and donning/dofifng technique in order to help decrease contracture in her left hand and allow her to utilize it during daily tasks.    Time 4    Period Weeks    Status New    Target Date 01/25/21      OT SHORT TERM GOAL #2   Title Pateint will be educated on various adaptive  equipment that can be utilized for ADL and IADL completion to allow greater independence with these tasks, using left hand actively.    Time 4    Period Weeks    Status New      OT SHORT TERM GOAL #3   Title Patient will improve available a/rom in left hand by 25% for improved ability to complete functional tasks.    Time 4    Period Weeks    Status New      OT SHORT TERM GOAL #4   Title Patient will improve left grip strength by 5# and pinch strength by 1# for improved ability to complete daily  tasks.    Time 4    Period Weeks    Status New      OT SHORT TERM GOAL #5   Title Patient will be able to pick up pegs for nine hole peg test independently and place them in the pegboard versus therapist handing pegs to patient, demonstrating improved hand mobility and fine motor coordination.    Time 4    Period Weeks    Status New                    Plan - 12/28/20 1610    Clinical Impression Statement A:  Patient is a 65 year old female with past medical history significant for HTN, arthritis, depression, frequent falls.  Patient presents with bilateral hand contractures, left greater than right, of unknown etiology.  She reports her hand began to contract about 2 years ago and has gotten worse.  Her left hand is painful and she is not able to straighten it to use her left hand to complete BADLs, IADLs.  She would like to be able to use her left hand functionally to complete ADLs, particularly braiding her hair.    OT Occupational Profile and History Detailed Assessment- Review of Records and additional review of physical, cognitive, psychosocial history related to current functional performance    Occupational performance deficits (Please refer to evaluation for details): ADL's;IADL's;Leisure    Body Structure / Function / Physical Skills ADL;Strength;Pain;Dexterity;UE functional use;IADL;ROM;Flexibility;FMC;Muscle spasms    Rehab Potential Fair    Comorbidities Affecting  Occupational Performance: May have comorbidities impacting occupational performance    Modification or Assistance to Complete Evaluation  Min-Moderate modification of tasks or assist with assess necessary to complete eval    OT Frequency 2x / week    OT Duration 4 weeks    OT Treatment/Interventions Self-care/ADL training;Electrical Stimulation;Therapeutic exercise;Patient/family education;Splinting;Neuromuscular education;Paraffin;Moist Heat;Energy conservation;Therapeutic activities;Passive range of motion;Manual Therapy;Contrast Bath;Ultrasound;Cryotherapy    Plan P:  Skilled OT intervention to improve available functional range in left hand, splinting, education on available adaptive equipment that will allow patient to complete tasks she desires to complete, as independently as possible. Next session:  assess state of splint made during prior episode of therapy and revamp as needed or fabricate new splint for night time use. Begin gentle stretching and attempt functional use of left hand with ADL tasks.    OT Home Exercise Plan eval:  a/rom of left hand, gross grasp and release    Consulted and Agree with Plan of Care Patient           Patient will benefit from skilled therapeutic intervention in order to improve the following deficits and impairments:   Body Structure / Function / Physical Skills: ADL,Strength,Pain,Dexterity,UE functional use,IADL,ROM,Flexibility,FMC,Muscle spasms       Visit Diagnosis: Stiffness of left hand, not elsewhere classified - Plan: Ot plan of care cert/re-cert  Other symptoms and signs involving the musculoskeletal system - Plan: Ot plan of care cert/re-cert  Other lack of coordination - Plan: Ot plan of care cert/re-cert    Problem List Patient Active Problem List   Diagnosis Date Noted  . Long term (current) use of opiate analgesic 01/21/2020  . Knee pain 01/21/2020  . Pain radiating to neck 01/21/2020  . Status post total right knee replacement  11/24/2018  . Alcohol use 10/23/2018  . Polyarticular arthritis 10/07/2018  . Low back pain 03/31/2017  . Status post cervical spinal fusion 03/31/2017  . S/P left unicompartmental knee replacement 08/15/2015  .  Anemia 08/06/2015  . Stage 3 chronic kidney disease ([place]) 08/06/2015  . Hepatitis C 03/02/2015  . Spondylosis, cervical, with myelopathy 09/21/2013  . Quadriplegia ([place]) 09/21/2013  . Osteoarthritis of left knee 09/21/2013  . Hypokalemia 08/16/2013  . AKI (acute kidney injury) ([place]) 08/16/2013  . Acute kidney failure, unspecified ([place]) 08/16/2013  . UTI (lower urinary tract infection) 08/07/2013  . Nausea & vomiting 08/07/2013  . Chronic kidney disease, stage IV (severe) ([place]) 08/07/2013  . Cervical myelopathy ([place]) 08/07/2013  . Osteoarthrosis, unspecified whether generalized or localized, involving lower leg 06/03/2013  . GERD 06/12/2009  . CHEST PAIN 06/12/2009  . LEG PAIN, BILATERAL 05/28/2009  . OBESITY 12/17/2007  . DYSPHAGIA UNSPECIFIED 12/03/2007  . ANEMIA, NORMOCYTIC 07/13/2007  . ALCOHOL ABUSE 07/13/2007  . RENAL DISEASE, CHRONIC, STAGE II 07/13/2007  . Alcohol abuse 07/13/2007  . ANXIETY 06/29/2007  . DEPRESSION 06/29/2007  . Essential hypertension 06/29/2007  . CONSTIPATION 06/29/2007  . ARTHRITIS 06/29/2007  . MALAISE AND FATIGUE 06/29/2007    Vangie Bicker, [place], OTR/L [phone]  12/28/2020, 11:56 AM  [facility] [address] [place], [place], [address] Phone: [phone]   Fax:  [phone]  Name: Colleen Nunez MRN: [id number] Date of Birth: 06-02-1956

## 2020-12-31 ENCOUNTER — Encounter (HOSPITAL_COMMUNITY): Payer: Self-pay

## 2020-12-31 NOTE — Therapy (Signed)
[place], [place], [address] Phone: [phone]   Fax:  [phone]  Patient Details  Name: Colleen Nunez MRN: [id number] Date of Birth: Jan 09, 1956 Referring Provider:  No ref. provider found  Encounter Date: 12/31/2020  PHYSICAL THERAPY DISCHARGE SUMMARY  Visits from Start of Care: 11  Current functional level related to goals / functional outcomes: Pt able to ambulate 339 ft during during 2MWT modified independent and improved FOTO 55% function vs start of care 41% function and met 2/3 STG and 2/2 LTG    Remaining deficits: None   Education / Equipment: Patient has updated HEP and demo compliance with program  Plan: Patient agrees to discharge.  Patient goals were met. Patient is being discharged due to meeting the stated rehab goals.  ?????     8:10 AM, 12/31/20 M. Sherlyn Lees, PT, DPT Physical Therapist- [place] Office Number: [phone]  [facility] [address] [place], [place], [address] Phone: [phone]   Fax:  [phone]

## 2021-01-01 ENCOUNTER — Encounter (HOSPITAL_COMMUNITY): Payer: Medicare Other | Admitting: Occupational Therapy

## 2021-01-04 ENCOUNTER — Ambulatory Visit (HOSPITAL_COMMUNITY): Payer: Medicare Other | Admitting: Occupational Therapy

## 2021-01-04 ENCOUNTER — Encounter (HOSPITAL_COMMUNITY): Payer: Self-pay | Admitting: Occupational Therapy

## 2021-01-04 ENCOUNTER — Other Ambulatory Visit: Payer: Self-pay

## 2021-01-04 DIAGNOSIS — M25642 Stiffness of left hand, not elsewhere classified: Secondary | ICD-10-CM

## 2021-01-04 DIAGNOSIS — R262 Difficulty in walking, not elsewhere classified: Secondary | ICD-10-CM | POA: Diagnosis not present

## 2021-01-04 DIAGNOSIS — R278 Other lack of coordination: Secondary | ICD-10-CM

## 2021-01-04 DIAGNOSIS — R29898 Other symptoms and signs involving the musculoskeletal system: Secondary | ICD-10-CM

## 2021-01-04 NOTE — Therapy (Signed)
[place], [place], [address] Phone: [phone]   Fax:  [phone]  Occupational Therapy Treatment  Patient Details  Name: Colleen Nunez MRN: [id number] Date of Birth: Jul 06, 1956 Referring Provider (OT): Dr. Sanjuana Kava   Encounter Date: 01/04/2021   OT End of Session - 01/04/21 1025    Visit Number 2    Number of Visits 8    Date for OT Re-Evaluation 01/25/21    Authorization Type Medicare    Progress Note Due on Visit 10    OT Start Time [phone]    OT Stop Time 1028    OT Time Calculation (min) 40 min    Activity Tolerance Patient tolerated treatment well    Behavior During Therapy [facility] for tasks assessed/performed           Past Medical History:  Diagnosis Date  . Anemia   . Anxiety   . Arthritis   . Chronic hip pain   . Chronic knee pain   . Depression    h/o suicide attempts in the past  . GERD (gastroesophageal reflux disease)   . Gout   . Hepatitis C antibody test positive       . Hypertension   . Polysubstance abuse ([place])    h/o  . Recurrent falls   . Renal insufficiency     Past Surgical History:  Procedure Laterality Date  . ABDOMINAL HYSTERECTOMY    . ANTERIOR CERVICAL CORPECTOMY N/A 06/09/2013   Procedure: C4 C5 Corpectomy with C6-7 Anterior cervical fusion/Peek cage/Trestle plate;  Surgeon: Otilio Connors, MD;  Location: [facility];  Service: Neurosurgery;  Laterality: N/A;  Cervical Four, Cervical Five Corpectomy and Cervical six-seven Anterior cervical fusion/Peek cage Three-Five /Trestle Plating Cervical Three to Cervical Seven  . ANTERIOR CERVICAL DECOMP/DISCECTOMY FUSION N/A 08/12/2013   Procedure: Repair of Anterior  Cervical CSF LEAK, lumbar drain placement.;  Surgeon: Floyce Stakes, MD;  Location: [facility];  Service: Neurosurgery;  Laterality: N/A;  . bilateral SOO and appendectomy  2006  . cartilage removal     left knee  . COLONOSCOPY N/A 07/04/2015   Procedure:  COLONOSCOPY;  Surgeon: Rogene Houston, MD;  [facility];  Service: Endoscopy;  Laterality: N/A;  100  . KNEE ARTHROSCOPY    . X-STOP IMPLANTATION      There were no vitals filed for this visit.   Subjective Assessment - 01/04/21 0951    Subjective  S: I brought that splint.    Currently in Pain? No/denies              [facility] OT Assessment - 01/04/21 0950      Assessment   Medical Diagnosis Left Hnad Contracture      Precautions   Precautions Fall                    OT Treatments/Exercises (OP) - 01/04/21 0954      ADLs   ADL Comments Educated pt on donning splint. Discussed waiting until pt is in the bed to donn and then doff before getting out of the bed to allow for LUE use when getting in/out of bed and with positioning. Pt demonstrating donning during session, OT educating on technique.      Exercises   Exercises Hand      Hand Exercises   Hand Gripper with Large Beads all beads with gripper at 7#, vertical for 3 and horizontal for 3  Other Hand Exercises towel crumple with squeeze, 10X      Manual Therapy   Manual Therapy Passive ROM    Manual therapy comments completed separately from therapeutic exercises    Passive ROM sustained passive stretching to left hand and digits to improve available ROM and decrease ROM      Fine Motor Coordination (Hand/Wrist)   Fine Motor Coordination Small Pegboard    Small Pegboard Pt completing small pegboard pattern working on picking up and manipulating small objects. Significantly increased time for task completion. Pt using tip pinch for task                    OT Short Term Goals - 01/04/21 1008      OT SHORT TERM GOAL #1   Title Patient will be provided with a fabricated hand splint and verbalize and demonstrate all precautions, wearing schedule, care management, and donning/dofifng technique in order to help decrease contracture in her left hand and allow her to utilize it during daily  tasks.    Time 4    Period Weeks    Status On-going    Target Date 01/25/21      OT SHORT TERM GOAL #2   Title Pateint will be educated on various adaptive equipment that can be utilized for ADL and IADL completion to allow greater independence with these tasks, using left hand actively.    Time 4    Period Weeks    Status On-going      OT SHORT TERM GOAL #3   Title Patient will improve available a/rom in left hand by 25% for improved ability to complete functional tasks.    Time 4    Period Weeks    Status On-going      OT SHORT TERM GOAL #4   Title Patient will improve left grip strength by 5# and pinch strength by 1# for improved ability to complete daily  tasks.    Time 4    Period Weeks    Status On-going      OT SHORT TERM GOAL #5   Title Patient will be able to pick up pegs for nine hole peg test independently and place them in the pegboard versus therapist handing pegs to patient, demonstrating improved hand mobility and fine motor coordination.    Time 4    Period Weeks    Status On-going                    Plan - 01/04/21 1008    Clinical Impression Statement A: Pt reporting she has an MD appt with a spinal MD coming up soon. Pt reporting she has had difficulty with her hands since her knee surgeries in 2009 and 2014, discussed longevity of time since surgeries and decreased liklihood of her hands being "normal." Pt brought splint which fits well, OT educated on donning/doffing techniques. Initiated passive stretching and gentle grip strengthening tasks this session, increased time to complete with cuing for speed and technique.    Body Structure / Function / Physical Skills ADL;Strength;Pain;Dexterity;UE functional use;IADL;ROM;Flexibility;FMC;Muscle spasms    Plan P: Continue with gentle passive stretching, grip strengthening, and fine motor coordination tasks    OT Home Exercise Plan eval:  a/rom of left hand, gross grasp and release    Consulted and Agree  with Plan of Care Patient           Patient will benefit from skilled therapeutic intervention in order to improve the  following deficits and impairments:   Body Structure / Function / Physical Skills: ADL,Strength,Pain,Dexterity,UE functional use,IADL,ROM,Flexibility,FMC,Muscle spasms       Visit Diagnosis: Stiffness of left hand, not elsewhere classified  Other symptoms and signs involving the musculoskeletal system  Other lack of coordination    Problem List Patient Active Problem List   Diagnosis Date Noted  . Long term (current) use of opiate analgesic 01/21/2020  . Knee pain 01/21/2020  . Pain radiating to neck 01/21/2020  . Status post total right knee replacement 11/24/2018  . Alcohol use 10/23/2018  . Polyarticular arthritis 10/07/2018  . Low back pain 03/31/2017  . Status post cervical spinal fusion 03/31/2017  . S/P left unicompartmental knee replacement 08/15/2015  . Anemia 08/06/2015  . Stage 3 chronic kidney disease ([place]) 08/06/2015  . Hepatitis C 03/02/2015  . Spondylosis, cervical, with myelopathy 09/21/2013  . Quadriplegia ([place]) 09/21/2013  . Osteoarthritis of left knee 09/21/2013  . Hypokalemia 08/16/2013  . AKI (acute kidney injury) ([place]) 08/16/2013  . Acute kidney failure, unspecified ([place]) 08/16/2013  . UTI (lower urinary tract infection) 08/07/2013  . Nausea & vomiting 08/07/2013  . Chronic kidney disease, stage IV (severe) ([place]) 08/07/2013  . Cervical myelopathy ([place]) 08/07/2013  . Osteoarthrosis, unspecified whether generalized or localized, involving lower leg 06/03/2013  . GERD 06/12/2009  . CHEST PAIN 06/12/2009  . LEG PAIN, BILATERAL 05/28/2009  . OBESITY 12/17/2007  . DYSPHAGIA UNSPECIFIED 12/03/2007  . ANEMIA, NORMOCYTIC 07/13/2007  . ALCOHOL ABUSE 07/13/2007  . RENAL DISEASE, CHRONIC, STAGE II 07/13/2007  . Alcohol abuse 07/13/2007  . ANXIETY 06/29/2007  . DEPRESSION 06/29/2007  . Essential hypertension 06/29/2007  .  CONSTIPATION 06/29/2007  . ARTHRITIS 06/29/2007  . MALAISE AND FATIGUE 06/29/2007   Guadelupe Sabin, OTR/L  [phone] 01/04/2021, 10:32 AM  [facility] [address] [place], [place], [address] Phone: [phone]   Fax:  [phone]  Name: Colleen Nunez MRN: [id number] Date of Birth: 08/14/1956

## 2021-01-07 ENCOUNTER — Encounter (HOSPITAL_COMMUNITY): Payer: Self-pay

## 2021-01-07 ENCOUNTER — Ambulatory Visit (HOSPITAL_COMMUNITY): Payer: Medicare Other

## 2021-01-07 ENCOUNTER — Other Ambulatory Visit: Payer: Self-pay

## 2021-01-07 DIAGNOSIS — R262 Difficulty in walking, not elsewhere classified: Secondary | ICD-10-CM | POA: Diagnosis not present

## 2021-01-07 DIAGNOSIS — R29898 Other symptoms and signs involving the musculoskeletal system: Secondary | ICD-10-CM

## 2021-01-07 DIAGNOSIS — M25642 Stiffness of left hand, not elsewhere classified: Secondary | ICD-10-CM

## 2021-01-07 DIAGNOSIS — R278 Other lack of coordination: Secondary | ICD-10-CM

## 2021-01-07 NOTE — Therapy (Signed)
[place], [place], [address] Phone: [phone]   Fax:  [phone]  Occupational Therapy Treatment  Patient Details  Name: Colleen Nunez MRN: [id number] Date of Birth: 12-Aug-1956 Referring Provider (OT): Dr. Sanjuana Kava   Encounter Date: 01/07/2021   OT End of Session - 01/07/21 0918    Visit Number 3    Number of Visits 8    Date for OT Re-Evaluation 01/25/21    Authorization Type Medicare    Progress Note Due on Visit 10    OT Start Time 0815    OT Stop Time 0855    OT Time Calculation (min) 40 min    Activity Tolerance Patient tolerated treatment well    Behavior During Therapy [facility] for tasks assessed/performed           Past Medical History:  Diagnosis Date  . Anemia   . Anxiety   . Arthritis   . Chronic hip pain   . Chronic knee pain   . Depression    h/o suicide attempts in the past  . GERD (gastroesophageal reflux disease)   . Gout   . Hepatitis C antibody test positive       . Hypertension   . Polysubstance abuse ([place])    h/o  . Recurrent falls   . Renal insufficiency     Past Surgical History:  Procedure Laterality Date  . ABDOMINAL HYSTERECTOMY    . ANTERIOR CERVICAL CORPECTOMY N/A 06/09/2013   Procedure: C4 C5 Corpectomy with C6-7 Anterior cervical fusion/Peek cage/Trestle plate;  Surgeon: Otilio Connors, MD;  Location: [facility];  Service: Neurosurgery;  Laterality: N/A;  Cervical Four, Cervical Five Corpectomy and Cervical six-seven Anterior cervical fusion/Peek cage Three-Five /Trestle Plating Cervical Three to Cervical Seven  . ANTERIOR CERVICAL DECOMP/DISCECTOMY FUSION N/A 08/12/2013   Procedure: Repair of Anterior  Cervical CSF LEAK, lumbar drain placement.;  Surgeon: Floyce Stakes, MD;  Location: [facility];  Service: Neurosurgery;  Laterality: N/A;  . bilateral SOO and appendectomy  2006  . cartilage removal     left knee  . COLONOSCOPY N/A 07/04/2015   Procedure:  COLONOSCOPY;  Surgeon: Rogene Houston, MD;  [facility];  Service: Endoscopy;  Laterality: N/A;  100  . KNEE ARTHROSCOPY    . X-STOP IMPLANTATION      There were no vitals filed for this visit.   Subjective Assessment - 01/07/21 0836    Subjective  S: I don't wear the splint at night. I'm up too much and I can't put it on and take it off.    Currently in Pain? No/denies              [facility] OT Assessment - 01/07/21 0839      Assessment   Medical Diagnosis Left Hand Contracture      Precautions   Precautions Fall                    OT Treatments/Exercises (OP) - 01/07/21 0839      Exercises   Exercises Hand      Hand Exercises   Hand Gripper with Large Beads all beads with gripper set at 7#   horizontal   Hand Gripper with Medium Beads all beads with gripper set at 7#   horizontal   Hand Gripper with Small Beads all beads with gripper set at 7#   horizontal\     Fine Motor Coordination (Hand/Wrist)  Fine Motor Coordination Grooved pegs    Grooved pegs Focusing on isolating the thumb and index finger and focusing on tip pinch. Index finger to pull peg to edge of table, then thumb and index finger complete tip pinch with wrist extended to place 6 pegs in pegboard                    OT Short Term Goals - 01/07/21 0926      OT SHORT TERM GOAL #1   Title Patient will be provided with a fabricated hand splint and verbalize and demonstrate all precautions, wearing schedule, care management, and donning/dofifng technique in order to help decrease contracture in her left hand and allow her to utilize it during daily tasks.    Baseline 2/21: Based on previous treatment session and patient report, a new splint does not appear to be indicated at this time.    Time 4    Period Weeks    Status Deferred    Target Date 01/25/21      OT SHORT TERM GOAL #2   Title Pateint will be educated on various adaptive equipment that can be utilized for ADL and  IADL completion to allow greater independence with these tasks, using left hand actively.    Time 4    Period Weeks    Status On-going      OT SHORT TERM GOAL #3   Title Patient will improve available a/rom in left hand by 25% for improved ability to complete functional tasks.    Time 4    Period Weeks    Status On-going      OT SHORT TERM GOAL #4   Title Patient will improve left grip strength by 5# and pinch strength by 1# for improved ability to complete daily  tasks.    Time 4    Period Weeks    Status On-going      OT SHORT TERM GOAL #5   Title Patient will be able to pick up pegs for nine hole peg test independently and place them in the pegboard versus therapist handing pegs to patient, demonstrating improved hand mobility and fine motor coordination.    Time 4    Period Weeks    Status On-going                    Plan - 01/07/21 7425    Clinical Impression Statement A: Focused on grip, pinch, and coordination during session while providing VC for form and technique such as attempting to isolate index finger and thumb to complete a tip pinch versus using a lateral pinch. Patient completed all activities with increased time. Initially attempting to complete her own way versus following suggestion made by therapist although due to increased difficulty she did complete task as suggested.    Body Structure / Function / Physical Skills ADL;Strength;Pain;Dexterity;UE functional use;IADL;ROM;Flexibility;FMC;Muscle spasms    Plan P: Continue to work on grip/pinch strengthening and fine motor coordination while working on maximizing functional active movement of fingers versus reverting back to using a weak lateral pinch.    Consulted and Agree with Plan of Care Patient           Patient will benefit from skilled therapeutic intervention in order to improve the following deficits and impairments:   Body Structure / Function / Physical Skills: ADL,Strength,Pain,Dexterity,UE  functional use,IADL,ROM,Flexibility,FMC,Muscle spasms       Visit Diagnosis: Other lack of coordination  Other symptoms and signs involving the  musculoskeletal system  Stiffness of left hand, not elsewhere classified    Problem List Patient Active Problem List   Diagnosis Date Noted  . Long term (current) use of opiate analgesic 01/21/2020  . Knee pain 01/21/2020  . Pain radiating to neck 01/21/2020  . Status post total right knee replacement 11/24/2018  . Alcohol use 10/23/2018  . Polyarticular arthritis 10/07/2018  . Low back pain 03/31/2017  . Status post cervical spinal fusion 03/31/2017  . S/P left unicompartmental knee replacement 08/15/2015  . Anemia 08/06/2015  . Stage 3 chronic kidney disease ([place]) 08/06/2015  . Hepatitis C 03/02/2015  . Spondylosis, cervical, with myelopathy 09/21/2013  . Quadriplegia ([place]) 09/21/2013  . Osteoarthritis of left knee 09/21/2013  . Hypokalemia 08/16/2013  . AKI (acute kidney injury) ([place]) 08/16/2013  . Acute kidney failure, unspecified ([place]) 08/16/2013  . UTI (lower urinary tract infection) 08/07/2013  . Nausea & vomiting 08/07/2013  . Chronic kidney disease, stage IV (severe) ([place]) 08/07/2013  . Cervical myelopathy ([place]) 08/07/2013  . Osteoarthrosis, unspecified whether generalized or localized, involving lower leg 06/03/2013  . GERD 06/12/2009  . CHEST PAIN 06/12/2009  . LEG PAIN, BILATERAL 05/28/2009  . OBESITY 12/17/2007  . DYSPHAGIA UNSPECIFIED 12/03/2007  . ANEMIA, NORMOCYTIC 07/13/2007  . ALCOHOL ABUSE 07/13/2007  . RENAL DISEASE, CHRONIC, STAGE II 07/13/2007  . Alcohol abuse 07/13/2007  . ANXIETY 06/29/2007  . DEPRESSION 06/29/2007  . Essential hypertension 06/29/2007  . CONSTIPATION 06/29/2007  . ARTHRITIS 06/29/2007  . MALAISE AND FATIGUE 06/29/2007    Ailene Ravel, OTR/L,CBIS  [phone]  01/07/2021, 9:38 AM  [facility] [address] [place], [place],  [address] Phone: [phone]   Fax:  [phone]  Name: TANYLA STEGE MRN: [id number] Date of Birth: 19-May-1956

## 2021-01-10 ENCOUNTER — Encounter (HOSPITAL_COMMUNITY): Payer: Self-pay | Admitting: Occupational Therapy

## 2021-01-10 ENCOUNTER — Other Ambulatory Visit: Payer: Self-pay

## 2021-01-10 ENCOUNTER — Ambulatory Visit (HOSPITAL_COMMUNITY): Payer: Medicare Other | Admitting: Occupational Therapy

## 2021-01-10 DIAGNOSIS — R29898 Other symptoms and signs involving the musculoskeletal system: Secondary | ICD-10-CM

## 2021-01-10 DIAGNOSIS — R262 Difficulty in walking, not elsewhere classified: Secondary | ICD-10-CM | POA: Diagnosis not present

## 2021-01-10 DIAGNOSIS — M25642 Stiffness of left hand, not elsewhere classified: Secondary | ICD-10-CM

## 2021-01-10 DIAGNOSIS — R278 Other lack of coordination: Secondary | ICD-10-CM

## 2021-01-10 NOTE — Therapy (Signed)
[place] [place], [place], [address] Phone: [phone]   Fax:  [phone]  Occupational Therapy Treatment  Patient Details  Name: Colleen Nunez MRN: [id number] Date of Birth: 07-Jun-1956 Referring Provider (OT): Dr. Sanjuana Kava   Encounter Date: 01/10/2021   OT End of Session - 01/10/21 1021    Visit Number 4    Number of Visits 8    Date for OT Re-Evaluation 01/25/21    Authorization Type Medicare    Progress Note Due on Visit 10    OT Start Time 1004    OT Stop Time 1028    OT Time Calculation (min) 24 min    Activity Tolerance Patient tolerated treatment well    Behavior During Therapy [facility] for tasks assessed/performed           Past Medical History:  Diagnosis Date  . Anemia   . Anxiety   . Arthritis   . Chronic hip pain   . Chronic knee pain   . Depression    h/o suicide attempts in the past  . GERD (gastroesophageal reflux disease)   . Gout   . Hepatitis C antibody test positive       . Hypertension   . Polysubstance abuse ([place])    h/o  . Recurrent falls   . Renal insufficiency     Past Surgical History:  Procedure Laterality Date  . ABDOMINAL HYSTERECTOMY    . ANTERIOR CERVICAL CORPECTOMY N/A 06/09/2013   Procedure: C4 C5 Corpectomy with C6-7 Anterior cervical fusion/Peek cage/Trestle plate;  Surgeon: Otilio Connors, MD;  Location: [place] [facility];  Service: Neurosurgery;  Laterality: N/A;  Cervical Four, Cervical Five Corpectomy and Cervical six-seven Anterior cervical fusion/Peek cage Three-Five /Trestle Plating Cervical Three to Cervical Seven  . ANTERIOR CERVICAL DECOMP/DISCECTOMY FUSION N/A 08/12/2013   Procedure: Repair of Anterior  Cervical CSF LEAK, lumbar drain placement.;  Surgeon: Floyce Stakes, MD;  Location: [facility];  Service: Neurosurgery;  Laterality: N/A;  . bilateral SOO and appendectomy  2006  . cartilage removal     left knee  . COLONOSCOPY N/A 07/04/2015   Procedure:  COLONOSCOPY;  Surgeon: Rogene Houston, MD;  [facility];  Service: Endoscopy;  Laterality: N/A;  100  . KNEE ARTHROSCOPY    . X-STOP IMPLANTATION      There were no vitals filed for this visit.   Subjective Assessment - 01/10/21 1007    Subjective  S: I used my left hand to pull up into the truck yesterday.    Currently in Pain? No/denies              [facility] OT Assessment - 01/10/21 1006      Assessment   Medical Diagnosis Left Hand Contracture      Precautions   Precautions Fall                    OT Treatments/Exercises (OP) - 01/10/21 1007      Exercises   Exercises Hand      Hand Exercises   Hand Gripper with Large Beads all beads with gripper set at 7#, horizontal    Hand Gripper with Medium Beads all beads with gripper set at 7#, horizontal    Hand Gripper with Small Beads all beads with gripper set at 7#, horizontal    Other Hand Exercises Pt placing yellow and red clothespins along top horizontal bar of pinch tree working on  using thumb and index finger in a tip pinch.      Fine Motor Coordination (Hand/Wrist)   Fine Motor Coordination Picking up coins    Picking up coins Pt with coins laid out on tabletop in front of her, working on isolating index finger to pull coin to edge of table, pick up with tip pinch, then drop into slotted container. Min difficulty                    OT Short Term Goals - 01/07/21 0926      OT SHORT TERM GOAL #1   Title Patient will be provided with a fabricated hand splint and verbalize and demonstrate all precautions, wearing schedule, care management, and donning/dofifng technique in order to help decrease contracture in her left hand and allow her to utilize it during daily tasks.    Baseline 2/21: Based on previous treatment session and patient report, a new splint does not appear to be indicated at this time.    Time 4    Period Weeks    Status Deferred    Target Date 01/25/21      OT SHORT  TERM GOAL #2   Title Pateint will be educated on various adaptive equipment that can be utilized for ADL and IADL completion to allow greater independence with these tasks, using left hand actively.    Time 4    Period Weeks    Status On-going      OT SHORT TERM GOAL #3   Title Patient will improve available a/rom in left hand by 25% for improved ability to complete functional tasks.    Time 4    Period Weeks    Status On-going      OT SHORT TERM GOAL #4   Title Patient will improve left grip strength by 5# and pinch strength by 1# for improved ability to complete daily  tasks.    Time 4    Period Weeks    Status On-going      OT SHORT TERM GOAL #5   Title Patient will be able to pick up pegs for nine hole peg test independently and place them in the pegboard versus therapist handing pegs to patient, demonstrating improved hand mobility and fine motor coordination.    Time 4    Period Weeks    Status On-going                    Plan - 01/10/21 1014    Clinical Impression Statement A: Pt arrived late due to transportation being late picking her up. Short session focusing on grip strengthening and isolating tip pinch during fine motor task. Pt did well with handgripper task, slightly increased time required for completion. Cuing for holding tip pinch using pad of index finger to maintain tip pinch versus switching back to lateral.    Body Structure / Function / Physical Skills ADL;Strength;Pain;Dexterity;UE functional use;IADL;ROM;Flexibility;FMC;Muscle spasms    Plan P: Fine motor task focusing on tip pinch, increase hand gripper to 11# or greater    OT Home Exercise Plan eval:  a/rom of left hand, gross grasp and release    Consulted and Agree with Plan of Care Patient           Patient will benefit from skilled therapeutic intervention in order to improve the following deficits and impairments:   Body Structure / Function / Physical Skills:  ADL,Strength,Pain,Dexterity,UE functional use,IADL,ROM,Flexibility,FMC,Muscle spasms       Visit  Diagnosis: Other lack of coordination  Other symptoms and signs involving the musculoskeletal system  Stiffness of left hand, not elsewhere classified    Problem List Patient Active Problem List   Diagnosis Date Noted  . Long term (current) use of opiate analgesic 01/21/2020  . Knee pain 01/21/2020  . Pain radiating to neck 01/21/2020  . Status post total right knee replacement 11/24/2018  . Alcohol use 10/23/2018  . Polyarticular arthritis 10/07/2018  . Low back pain 03/31/2017  . Status post cervical spinal fusion 03/31/2017  . S/P left unicompartmental knee replacement 08/15/2015  . Anemia 08/06/2015  . Stage 3 chronic kidney disease ([place]) 08/06/2015  . Hepatitis C 03/02/2015  . Spondylosis, cervical, with myelopathy 09/21/2013  . Quadriplegia ([place]) 09/21/2013  . Osteoarthritis of left knee 09/21/2013  . Hypokalemia 08/16/2013  . AKI (acute kidney injury) ([place]) 08/16/2013  . Acute kidney failure, unspecified ([place]) 08/16/2013  . UTI (lower urinary tract infection) 08/07/2013  . Nausea & vomiting 08/07/2013  . Chronic kidney disease, stage IV (severe) ([place]) 08/07/2013  . Cervical myelopathy ([place]) 08/07/2013  . Osteoarthrosis, unspecified whether generalized or localized, involving lower leg 06/03/2013  . GERD 06/12/2009  . CHEST PAIN 06/12/2009  . LEG PAIN, BILATERAL 05/28/2009  . OBESITY 12/17/2007  . DYSPHAGIA UNSPECIFIED 12/03/2007  . ANEMIA, NORMOCYTIC 07/13/2007  . ALCOHOL ABUSE 07/13/2007  . RENAL DISEASE, CHRONIC, STAGE II 07/13/2007  . Alcohol abuse 07/13/2007  . ANXIETY 06/29/2007  . DEPRESSION 06/29/2007  . Essential hypertension 06/29/2007  . CONSTIPATION 06/29/2007  . ARTHRITIS 06/29/2007  . MALAISE AND FATIGUE 06/29/2007   Guadelupe Sabin, OTR/L  [phone] 01/10/2021, 10:28 AM  [place], [place], [address] Phone: [phone]   Fax:  [phone]  Name: Colleen Nunez MRN: [id number] Date of Birth: Aug 07, 1956

## 2021-01-14 ENCOUNTER — Ambulatory Visit (HOSPITAL_COMMUNITY): Payer: Medicare Other

## 2021-01-14 ENCOUNTER — Other Ambulatory Visit: Payer: Self-pay

## 2021-01-14 ENCOUNTER — Encounter (HOSPITAL_COMMUNITY): Payer: Self-pay

## 2021-01-14 DIAGNOSIS — M25642 Stiffness of left hand, not elsewhere classified: Secondary | ICD-10-CM

## 2021-01-14 DIAGNOSIS — R29898 Other symptoms and signs involving the musculoskeletal system: Secondary | ICD-10-CM

## 2021-01-14 DIAGNOSIS — R278 Other lack of coordination: Secondary | ICD-10-CM

## 2021-01-14 DIAGNOSIS — R262 Difficulty in walking, not elsewhere classified: Secondary | ICD-10-CM | POA: Diagnosis not present

## 2021-01-14 NOTE — Therapy (Signed)
[place], [place], [address] Phone: [phone]   Fax:  [phone]  Occupational Therapy Treatment  Patient Details  Name: Colleen Nunez MRN: [id number] Date of Birth: 08/08/1956 Referring Provider (OT): Dr. Sanjuana Kava   Encounter Date: 01/14/2021   OT End of Session - 01/14/21 0947    Visit Number 5    Number of Visits 8    Date for OT Re-Evaluation 01/25/21    Authorization Type Medicare    Progress Note Due on Visit 10    OT Start Time 0903   Pt used bathroom at start of session. Session started late.   OT Stop Time 0939    OT Time Calculation (min) 36 min    Activity Tolerance Patient tolerated treatment well    Behavior During Therapy [facility] for tasks assessed/performed           Past Medical History:  Diagnosis Date  . Anemia   . Anxiety   . Arthritis   . Chronic hip pain   . Chronic knee pain   . Depression    h/o suicide attempts in the past  . GERD (gastroesophageal reflux disease)   . Gout   . Hepatitis C antibody test positive       . Hypertension   . Polysubstance abuse ([place])    h/o  . Recurrent falls   . Renal insufficiency     Past Surgical History:  Procedure Laterality Date  . ABDOMINAL HYSTERECTOMY    . ANTERIOR CERVICAL CORPECTOMY N/A 06/09/2013   Procedure: C4 C5 Corpectomy with C6-7 Anterior cervical fusion/Peek cage/Trestle plate;  Surgeon: Otilio Connors, MD;  Location: [place] [facility];  Service: Neurosurgery;  Laterality: N/A;  Cervical Four, Cervical Five Corpectomy and Cervical six-seven Anterior cervical fusion/Peek cage Three-Five /Trestle Plating Cervical Three to Cervical Seven  . ANTERIOR CERVICAL DECOMP/DISCECTOMY FUSION N/A 08/12/2013   Procedure: Repair of Anterior  Cervical CSF LEAK, lumbar drain placement.;  Surgeon: Floyce Stakes, MD;  Location: [facility];  Service: Neurosurgery;  Laterality: N/A;  . bilateral SOO and appendectomy  2006  . cartilage removal      left knee  . COLONOSCOPY N/A 07/04/2015   Procedure: COLONOSCOPY;  Surgeon: Rogene Houston, MD;  [facility];  Service: Endoscopy;  Laterality: N/A;  100  . KNEE ARTHROSCOPY    . X-STOP IMPLANTATION      There were no vitals filed for this visit.   Subjective Assessment - 01/14/21 0906    Subjective  S: I was going to bring my hand brace for you to see and I left it on the couch.    Currently in Pain? Yes    Pain Score 5     Pain Location Hip    Pain Orientation Left    Pain Descriptors / Indicators Discomfort;Squeezing    Pain Type Acute pain    Pain Onset Yesterday    Pain Frequency Constant    Aggravating Factors  sitting for a long time yesterday on hard chair    Pain Relieving Factors correct sitting posture    Effect of Pain on Daily Activities min-mod effect    Multiple Pain Sites No              [facility] OT Assessment - 01/14/21 0908      Assessment   Medical Diagnosis Left Hand Contracture      Precautions   Precautions Fall  OT Treatments/Exercises (OP) - 01/14/21 0908      Exercises   Exercises Hand      Hand Exercises   Hand Gripper with Large Beads all beads with gripper set at 11#, horizontal    Hand Gripper with Medium Beads all beads except 3 with gripper set at 11#, horizontal. Final 3 beads picked up with 7# resistance.    Hand Gripper with Small Beads Unable to use handgripper for small beads. Used small kitchen tongs horizontally instead to pick up beads.    Sponges 2,2    Other Hand Exercises Attempted using yellow clothespin with a 3 point pinch to pick up soft sponges although unable to      Fine Motor Coordination (Hand/Wrist)   Fine Motor Coordination In hand manipuation training    In Hand Manipulation Training Using high resistance sponges, patient utilized 2 point pinch to stack 4 towers 3 high. Used small kitchen tong to pick up entire tower or single sponges and place in container.                     OT Short Term Goals - 01/07/21 0926      OT SHORT TERM GOAL #1   Title Patient will be provided with a fabricated hand splint and verbalize and demonstrate all precautions, wearing schedule, care management, and donning/dofifng technique in order to help decrease contracture in her left hand and allow her to utilize it during daily tasks.    Baseline 2/21: Based on previous treatment session and patient report, a new splint does not appear to be indicated at this time.    Time 4    Period Weeks    Status Deferred    Target Date 01/25/21      OT SHORT TERM GOAL #2   Title Pateint will be educated on various adaptive equipment that can be utilized for ADL and IADL completion to allow greater independence with these tasks, using left hand actively.    Time 4    Period Weeks    Status On-going      OT SHORT TERM GOAL #3   Title Patient will improve available a/rom in left hand by 25% for improved ability to complete functional tasks.    Time 4    Period Weeks    Status On-going      OT SHORT TERM GOAL #4   Title Patient will improve left grip strength by 5# and pinch strength by 1# for improved ability to complete daily  tasks.    Time 4    Period Weeks    Status On-going      OT SHORT TERM GOAL #5   Title Patient will be able to pick up pegs for nine hole peg test independently and place them in the pegboard versus therapist handing pegs to patient, demonstrating improved hand mobility and fine motor coordination.    Time 4    Period Weeks    Status On-going                    Plan - 01/14/21 0947    Clinical Impression Statement A: Due to chronic contracture in left left digits 2-5 continue to limited with jonit mobility. PIP joints remained flexed in 90 degrees. Focused session on grip strength while increasing handgripper to 11#. Was unable to pick up all beads due to hand fatigue and task was modified to finishing with small kitchen tongs. Max  cueing to take  rest breaks more frequently due to hand fatigue from increased resistance. VC for form and technique throughout session.    Body Structure / Function / Physical Skills ADL;Strength;Pain;Dexterity;UE functional use;IADL;ROM;Flexibility;FMC;Muscle spasms    Plan P: Fine motor task focusing on tip pinch. Continue with hand strengthening as able.    Consulted and Agree with Plan of Care Patient           Patient will benefit from skilled therapeutic intervention in order to improve the following deficits and impairments:   Body Structure / Function / Physical Skills: ADL,Strength,Pain,Dexterity,UE functional use,IADL,ROM,Flexibility,FMC,Muscle spasms       Visit Diagnosis: Other symptoms and signs involving the musculoskeletal system  Other lack of coordination  Stiffness of left hand, not elsewhere classified    Problem List Patient Active Problem List   Diagnosis Date Noted  . Long term (current) use of opiate analgesic 01/21/2020  . Knee pain 01/21/2020  . Pain radiating to neck 01/21/2020  . Status post total right knee replacement 11/24/2018  . Alcohol use 10/23/2018  . Polyarticular arthritis 10/07/2018  . Low back pain 03/31/2017  . Status post cervical spinal fusion 03/31/2017  . S/P left unicompartmental knee replacement 08/15/2015  . Anemia 08/06/2015  . Stage 3 chronic kidney disease ([place]) 08/06/2015  . Hepatitis C 03/02/2015  . Spondylosis, cervical, with myelopathy 09/21/2013  . Quadriplegia ([place]) 09/21/2013  . Osteoarthritis of left knee 09/21/2013  . Hypokalemia 08/16/2013  . AKI (acute kidney injury) ([place]) 08/16/2013  . Acute kidney failure, unspecified ([place]) 08/16/2013  . UTI (lower urinary tract infection) 08/07/2013  . Nausea & vomiting 08/07/2013  . Chronic kidney disease, stage IV (severe) ([place]) 08/07/2013  . Cervical myelopathy ([place]) 08/07/2013  . Osteoarthrosis, unspecified whether generalized or localized, involving lower leg  06/03/2013  . GERD 06/12/2009  . CHEST PAIN 06/12/2009  . LEG PAIN, BILATERAL 05/28/2009  . OBESITY 12/17/2007  . DYSPHAGIA UNSPECIFIED 12/03/2007  . ANEMIA, NORMOCYTIC 07/13/2007  . ALCOHOL ABUSE 07/13/2007  . RENAL DISEASE, CHRONIC, STAGE II 07/13/2007  . Alcohol abuse 07/13/2007  . ANXIETY 06/29/2007  . DEPRESSION 06/29/2007  . Essential hypertension 06/29/2007  . CONSTIPATION 06/29/2007  . ARTHRITIS 06/29/2007  . MALAISE AND FATIGUE 06/29/2007    Ailene Ravel, OTR/L,CBIS  [phone]  01/14/2021, 9:50 AM  [facility] [address] [place], [place], [address] Phone: [phone]   Fax:  [phone]  Name: Colleen Nunez MRN: [id number] Date of Birth: [birth year]

## 2021-01-16 ENCOUNTER — Other Ambulatory Visit: Payer: Self-pay

## 2021-01-16 ENCOUNTER — Encounter (HOSPITAL_COMMUNITY): Payer: Self-pay | Admitting: Occupational Therapy

## 2021-01-16 ENCOUNTER — Ambulatory Visit (HOSPITAL_COMMUNITY): Payer: 59 | Attending: Orthopedic Surgery | Admitting: Occupational Therapy

## 2021-01-16 DIAGNOSIS — M25642 Stiffness of left hand, not elsewhere classified: Secondary | ICD-10-CM | POA: Insufficient documentation

## 2021-01-16 DIAGNOSIS — R278 Other lack of coordination: Secondary | ICD-10-CM | POA: Insufficient documentation

## 2021-01-16 DIAGNOSIS — R29898 Other symptoms and signs involving the musculoskeletal system: Secondary | ICD-10-CM | POA: Insufficient documentation

## 2021-01-16 NOTE — Therapy (Signed)
[facility] [place], [place], [address] Phone: [phone]   Fax:  [phone]  Occupational Therapy Treatment  Patient Details  Name: Colleen Nunez MRN: [id number] Date of Birth: 10-31-1956 Referring Provider (OT): Dr. Sanjuana Kava   Encounter Date: 01/16/2021   OT End of Session - 01/16/21 0936    Visit Number 6    Number of Visits 8    Date for OT Re-Evaluation 01/25/21    Authorization Type Medicare    Progress Note Due on Visit 10    OT Start Time 0904    OT Stop Time 0942    OT Time Calculation (min) 38 min    Activity Tolerance Patient tolerated treatment well    Behavior During Therapy [facility] for tasks assessed/performed           Past Medical History:  Diagnosis Date  . Anemia   . Anxiety   . Arthritis   . Chronic hip pain   . Chronic knee pain   . Depression    h/o suicide attempts in the past  . GERD (gastroesophageal reflux disease)   . Gout   . Hepatitis C antibody test positive       . Hypertension   . Polysubstance abuse ([place])    h/o  . Recurrent falls   . Renal insufficiency     Past Surgical History:  Procedure Laterality Date  . ABDOMINAL HYSTERECTOMY    . ANTERIOR CERVICAL CORPECTOMY N/A 06/09/2013   Procedure: C4 C5 Corpectomy with C6-7 Anterior cervical fusion/Peek cage/Trestle plate;  Surgeon: Otilio Connors, MD;  Location: [facility];  Service: Neurosurgery;  Laterality: N/A;  Cervical Four, Cervical Five Corpectomy and Cervical six-seven Anterior cervical fusion/Peek cage Three-Five /Trestle Plating Cervical Three to Cervical Seven  . ANTERIOR CERVICAL DECOMP/DISCECTOMY FUSION N/A 08/12/2013   Procedure: Repair of Anterior  Cervical CSF LEAK, lumbar drain placement.;  Surgeon: Floyce Stakes, MD;  Location: [facility];  Service: Neurosurgery;  Laterality: N/A;  . bilateral SOO and appendectomy  2006  . cartilage removal     left knee  . COLONOSCOPY N/A 07/04/2015   Procedure:  COLONOSCOPY;  Surgeon: Rogene Houston, MD;  [facility];  Service: Endoscopy;  Laterality: N/A;  100  . KNEE ARTHROSCOPY    . X-STOP IMPLANTATION      There were no vitals filed for this visit.   Subjective Assessment - 01/16/21 0907    Subjective  S: My hip is hurting.    Currently in Pain? Yes    Pain Score 7     Pain Location Hip    Pain Orientation Left    Pain Descriptors / Indicators Discomfort;Squeezing    Pain Type Acute pain    Pain Radiating Towards N/A    Pain Onset Yesterday    Pain Frequency Constant    Aggravating Factors  sitting a lot    Pain Relieving Factors rest, repositioning    Effect of Pain on Daily Activities min effect on ADLs    Multiple Pain Sites No              [facility] OT Assessment - 01/16/21 0908      Assessment   Medical Diagnosis Left Hand Contracture      Precautions   Precautions Fall                    OT Treatments/Exercises (OP) - 01/16/21 2992  Exercises   Exercises Hand      Hand Exercises   Hand Gripper with Large Beads all beads with gripper set at 11#, horizontal    Hand Gripper with Medium Beads 2 beads with gripper set at 11#, horizontal      Fine Motor Coordination (Hand/Wrist)   Fine Motor Coordination Small Pegboard    In Hand Manipulation Training Pt working on [facility] game, placing all pieces into board using left tip pinch. Pt placing all pieces in 3'33"    Small Pegboard Pt completing pegboard pattern working on picking up pegs using tip pinch to place into pegboard. Pt picking pieces up, then using right hand to turn peg in correct direction as she was unable to manipulate peg once she picked it up using the left hand. Significantly increased time for completing task.                    OT Short Term Goals - 01/07/21 0926      OT SHORT TERM GOAL #1   Title Patient will be provided with a fabricated hand splint and verbalize and demonstrate all precautions, wearing  schedule, care management, and donning/dofifng technique in order to help decrease contracture in her left hand and allow her to utilize it during daily tasks.    Baseline 2/21: Based on previous treatment session and patient report, a new splint does not appear to be indicated at this time.    Time 4    Period Weeks    Status Deferred    Target Date 01/25/21      OT SHORT TERM GOAL #2   Title Pateint will be educated on various adaptive equipment that can be utilized for ADL and IADL completion to allow greater independence with these tasks, using left hand actively.    Time 4    Period Weeks    Status On-going      OT SHORT TERM GOAL #3   Title Patient will improve available a/rom in left hand by 25% for improved ability to complete functional tasks.    Time 4    Period Weeks    Status On-going      OT SHORT TERM GOAL #4   Title Patient will improve left grip strength by 5# and pinch strength by 1# for improved ability to complete daily  tasks.    Time 4    Period Weeks    Status On-going      OT SHORT TERM GOAL #5   Title Patient will be able to pick up pegs for nine hole peg test independently and place them in the pegboard versus therapist handing pegs to patient, demonstrating improved hand mobility and fine motor coordination.    Time 4    Period Weeks    Status On-going                    Plan - 01/16/21 4196    Clinical Impression Statement A: Session focusing on fine motor coordination using tip pinch to grasp and place objects. Significantly increased time required for tip pinch tasks, cuing for speed and focus. Continued with grip strengthening, cuing for taking breaks due to fatigue. Increased difficulty with grip as it was completed at end of session versus beginning of session and hand was already fatigued from fine motor work.    Body Structure / Function / Physical Skills ADL;Strength;Pain;Dexterity;UE functional use;IADL;ROM;Flexibility;FMC;Muscle spasms     Plan P: Reassess and discharge pt  OT Home Exercise Plan eval:  a/rom of left hand, gross grasp and release    Consulted and Agree with Plan of Care Patient           Patient will benefit from skilled therapeutic intervention in order to improve the following deficits and impairments:   Body Structure / Function / Physical Skills: ADL,Strength,Pain,Dexterity,UE functional use,IADL,ROM,Flexibility,FMC,Muscle spasms       Visit Diagnosis: Other symptoms and signs involving the musculoskeletal system  Other lack of coordination  Stiffness of left hand, not elsewhere classified    Problem List Patient Active Problem List   Diagnosis Date Noted  . Long term (current) use of opiate analgesic 01/21/2020  . Knee pain 01/21/2020  . Pain radiating to neck 01/21/2020  . Status post total right knee replacement 11/24/2018  . Alcohol use 10/23/2018  . Polyarticular arthritis 10/07/2018  . Low back pain 03/31/2017  . Status post cervical spinal fusion 03/31/2017  . S/P left unicompartmental knee replacement 08/15/2015  . Anemia 08/06/2015  . Stage 3 chronic kidney disease ([place]) 08/06/2015  . Hepatitis C 03/02/2015  . Spondylosis, cervical, with myelopathy 09/21/2013  . Quadriplegia ([place]) 09/21/2013  . Osteoarthritis of left knee 09/21/2013  . Hypokalemia 08/16/2013  . AKI (acute kidney injury) ([place]) 08/16/2013  . Acute kidney failure, unspecified ([place]) 08/16/2013  . UTI (lower urinary tract infection) 08/07/2013  . Nausea & vomiting 08/07/2013  . Chronic kidney disease, stage IV (severe) ([place]) 08/07/2013  . Cervical myelopathy ([place]) 08/07/2013  . Osteoarthrosis, unspecified whether generalized or localized, involving lower leg 06/03/2013  . GERD 06/12/2009  . CHEST PAIN 06/12/2009  . LEG PAIN, BILATERAL 05/28/2009  . OBESITY 12/17/2007  . DYSPHAGIA UNSPECIFIED 12/03/2007  . ANEMIA, NORMOCYTIC 07/13/2007  . ALCOHOL ABUSE 07/13/2007  . RENAL DISEASE, CHRONIC, STAGE II  07/13/2007  . Alcohol abuse 07/13/2007  . ANXIETY 06/29/2007  . DEPRESSION 06/29/2007  . Essential hypertension 06/29/2007  . CONSTIPATION 06/29/2007  . ARTHRITIS 06/29/2007  . MALAISE AND FATIGUE 06/29/2007   Guadelupe Sabin, OTR/L  [phone] 01/16/2021, 9:42 AM  [facility] [address] [place], [place], [address] Phone: [phone]   Fax:  [phone]  Name: Colleen Nunez MRN: [id number] Date of Birth: [birth year]

## 2021-01-21 ENCOUNTER — Encounter (HOSPITAL_COMMUNITY): Payer: Self-pay

## 2021-01-21 ENCOUNTER — Ambulatory Visit (HOSPITAL_COMMUNITY): Payer: 59

## 2021-01-21 ENCOUNTER — Other Ambulatory Visit: Payer: Self-pay

## 2021-01-21 DIAGNOSIS — M25642 Stiffness of left hand, not elsewhere classified: Secondary | ICD-10-CM

## 2021-01-21 DIAGNOSIS — R278 Other lack of coordination: Secondary | ICD-10-CM

## 2021-01-21 DIAGNOSIS — R29898 Other symptoms and signs involving the musculoskeletal system: Secondary | ICD-10-CM

## 2021-01-21 NOTE — Therapy (Signed)
[place], [place], [address] Phone: [phone]   Fax:  [phone]  Occupational Therapy Treatment Reassessment and discharge summary Patient Details  Name: Colleen Nunez MRN: [id number] Date of Birth: 05-Feb-1956 Referring Provider (OT): Dr. Sanjuana Kava   Encounter Date: 01/21/2021   OT End of Session - 01/21/21 6962    Visit Number 7    Number of Visits 8    Authorization Type Medicare    Progress Note Due on Visit 10    OT Start Time 0830   Pt arrived late and then used the bathroom. Session started late. Reassess and discharge   OT Stop Time 0858    OT Time Calculation (min) 28 min    Activity Tolerance Patient tolerated treatment well    Behavior During Therapy WFL for tasks assessed/performed           Past Medical History:  Diagnosis Date  . Anemia   . Anxiety   . Arthritis   . Chronic hip pain   . Chronic knee pain   . Depression    h/o suicide attempts in the past  . GERD (gastroesophageal reflux disease)   . Gout   . Hepatitis C antibody test positive       . Hypertension   . Polysubstance abuse ([place])    h/o  . Recurrent falls   . Renal insufficiency     Past Surgical History:  Procedure Laterality Date  . ABDOMINAL HYSTERECTOMY    . ANTERIOR CERVICAL CORPECTOMY N/A 06/09/2013   Procedure: C4 C5 Corpectomy with C6-7 Anterior cervical fusion/Peek cage/Trestle plate;  Surgeon: Otilio Connors, MD;  Location: [facility];  Service: Neurosurgery;  Laterality: N/A;  Cervical Four, Cervical Five Corpectomy and Cervical six-seven Anterior cervical fusion/Peek cage Three-Five /Trestle Plating Cervical Three to Cervical Seven  . ANTERIOR CERVICAL DECOMP/DISCECTOMY FUSION N/A 08/12/2013   Procedure: Repair of Anterior  Cervical CSF LEAK, lumbar drain placement.;  Surgeon: Floyce Stakes, MD;  Location: [facility];  Service: Neurosurgery;  Laterality: N/A;  . bilateral SOO and appendectomy  2006   . cartilage removal     left knee  . COLONOSCOPY N/A 07/04/2015   Procedure: COLONOSCOPY;  Surgeon: Rogene Houston, MD;  [facility];  Service: Endoscopy;  Laterality: N/A;  100  . KNEE ARTHROSCOPY    . X-STOP IMPLANTATION      There were no vitals filed for this visit.   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 But I do my best.    Currently in Pain? Yes    Pain Score 5     Pain Location Hip    Pain Orientation Left    Pain Descriptors / Indicators Discomfort    Pain Type Acute pain    Pain Onset Yesterday    Pain Frequency Constant    Aggravating Factors  movement and walking    Pain Relieving Factors rest, repositioning    Effect of Pain on Daily Activities mod effect    Multiple Pain Sites No              [facility] OT Assessment - 01/21/21 0831      Assessment   Medical Diagnosis Left Hand Contracture      Precautions   Precautions Fall      Coordination   9 Hole Peg Test Left    Left 9 Hole Peg  Test 1'07" (independently)   Previous: 1'14" with set up for all pegs except first two pegs     ROM / Strength   AROM / PROM / Strength Strength;AROM      AROM   Overall AROM  Deficits    Overall AROM Comments A/ROM of the left hand does not demonstrate any measureable improvement since initial evaluation. Continues to be with A/ROM due to chronic hand contracture effecting all digits.      Hand Function   Left Hand Grip (lbs) 10   previous: 5   Left Hand Lateral Pinch 2 lbs   previous: 0   Left 3 point pinch 5 lbs   previous: 1             Quick Dash - 01/21/21 0001    Open a tight or new jar Unable    Do heavy household chores (wash walls, wash floors) Mild difficulty    Carry a shopping bag or briefcase Mild difficulty    Wash your back Severe difficulty    Use a knife to cut food Mild difficulty    Recreational activities in which you take some force or impact through your arm, shoulder, or hand (golf,  hammering, tennis) No difficulty    During the past week, to what extent has your arm, shoulder or hand problem interfered with your normal social activities with family, friends, neighbors, or groups? Quite a bit    During the past week, to what extent has your arm, shoulder or hand problem limited your work or other regular daily activities Modererately    Arm, shoulder, or hand pain. None    Tingling (pins and needles) in your arm, shoulder, or hand None    Difficulty Sleeping Mild difficulty    DASH Score 36.36 %                        OT Education - 01/21/21 0919    Education Details Discussed therapy goals. Reviewed adaptive equipment that may be helpful with ADL tasks while using the left hand. patient provided with a piece of Dycem to use for opening containers/jars. Informed patient that her pharmacy may be able to provided a different top to her medication that can be flipped over and placed on medication container without child safety feature and will be easier to open. Recommended that patient continue with her provided HEP.    Person(s) Educated Patient    Methods Explanation    Comprehension Verbalized understanding            OT Short Term Goals - 01/21/21 1275      OT SHORT TERM GOAL #1   Title Patient will be provided with a fabricated hand splint and verbalize and demonstrate all precautions, wearing schedule, care management, and donning/dofifng technique in order to help decrease contracture in her left hand and allow her to utilize it during daily tasks.    Baseline 2/21: Based on previous treatment session and patient report, a new splint does not appear to be indicated at this time.    Time 4    Period Weeks    Status Deferred    Target Date 01/25/21      OT SHORT TERM GOAL #2   Title Pateint will be educated on various adaptive equipment that can be utilized for ADL and IADL completion to allow greater independence with these tasks, using left hand  actively.    Time 4  Period Weeks    Status Achieved      OT SHORT TERM GOAL #3   Title Patient will improve available a/rom in left hand by 25% for improved ability to complete functional tasks.    Time 4    Period Weeks    Status Not Met      OT SHORT TERM GOAL #4   Title Patient will improve left grip strength by 5# and pinch strength by 1# for improved ability to complete daily  tasks.    Time 4    Period Weeks    Status Achieved      OT SHORT TERM GOAL #5   Title Patient will be able to pick up pegs for nine hole peg test independently and place them in the pegboard versus therapist handing pegs to patient, demonstrating improved hand mobility and fine motor coordination.    Time 4    Period Weeks    Status Achieved                    Plan - 01/21/21 3299    Clinical Impression Statement A: Reassessment and discharge completed this date. patient has made some improvement with pinch, grip, and coordination. No change in A/ROM due to chronic contracture. Patient reports that she is using and incorporating her left hand into her daily tasks as much as she can. She is using her hand braces during the day if she feels she needs them. She is using her HEP provided at evaluation. 3/4 therapy goals were met.    Body Structure / Function / Physical Skills ADL;Strength;Pain;Dexterity;UE functional use;IADL;ROM;Flexibility;FMC;Muscle spasms    Plan P: D/C from therapy with HEP.    Consulted and Agree with Plan of Care Patient           Patient will benefit from skilled therapeutic intervention in order to improve the following deficits and impairments:   Body Structure / Function / Physical Skills: ADL,Strength,Pain,Dexterity,UE functional use,IADL,ROM,Flexibility,FMC,Muscle spasms       Visit Diagnosis: Stiffness of left hand, not elsewhere classified  Other lack of coordination  Other symptoms and signs involving the musculoskeletal system    Problem  List Patient Active Problem List   Diagnosis Date Noted  . Long term (current) use of opiate analgesic 01/21/2020  . Knee pain 01/21/2020  . Pain radiating to neck 01/21/2020  . Status post total right knee replacement 11/24/2018  . Alcohol use 10/23/2018  . Polyarticular arthritis 10/07/2018  . Low back pain 03/31/2017  . Status post cervical spinal fusion 03/31/2017  . S/P left unicompartmental knee replacement 08/15/2015  . Anemia 08/06/2015  . Stage 3 chronic kidney disease ([place]) 08/06/2015  . Hepatitis C 03/02/2015  . Spondylosis, cervical, with myelopathy 09/21/2013  . Quadriplegia ([place]) 09/21/2013  . Osteoarthritis of left knee 09/21/2013  . Hypokalemia 08/16/2013  . AKI (acute kidney injury) ([place]) 08/16/2013  . Acute kidney failure, unspecified ([place]) 08/16/2013  . UTI (lower urinary tract infection) 08/07/2013  . Nausea & vomiting 08/07/2013  . Chronic kidney disease, stage IV (severe) ([place]) 08/07/2013  . Cervical myelopathy ([place]) 08/07/2013  . Osteoarthrosis, unspecified whether generalized or localized, involving lower leg 06/03/2013  . GERD 06/12/2009  . CHEST PAIN 06/12/2009  . LEG PAIN, BILATERAL 05/28/2009  . OBESITY 12/17/2007  . DYSPHAGIA UNSPECIFIED 12/03/2007  . ANEMIA, NORMOCYTIC 07/13/2007  . ALCOHOL ABUSE 07/13/2007  . RENAL DISEASE, CHRONIC, STAGE II 07/13/2007  . Alcohol abuse 07/13/2007  . ANXIETY 06/29/2007  . DEPRESSION  06/29/2007  . Essential hypertension 06/29/2007  . CONSTIPATION 06/29/2007  . ARTHRITIS 06/29/2007  . MALAISE AND FATIGUE 06/29/2007   OCCUPATIONAL THERAPY DISCHARGE SUMMARY  Visits from Start of Care: 6  Current functional level related to goals / functional outcomes: Pt reports that she is right handed so she is able to complete daily tasks using her Right hand primarily although she does try and use her left hand as much as she can. She is able to utilize her left hand to help with laundry (folding).    Remaining  deficits: Limited with A/ROM of the left hand, strength (grip and pinch) and coordination   Education / Equipment: See above Plan: Patient agrees to discharge.  Patient goals were partially met. Patient is being discharged due to meeting the stated rehab goals.  ?????        Ailene Ravel, OTR/L,CBIS  [phone]  01/21/2021, 9:34 AM  [place] [address] [place], [place], [address] Phone: [phone]   Fax:  [phone]  Name: Colleen Nunez MRN: [id number] Date of Birth: 1956-08-05

## 2021-01-23 ENCOUNTER — Encounter (HOSPITAL_COMMUNITY): Payer: Medicare Other

## 2021-01-23 DIAGNOSIS — M4316 Spondylolisthesis, lumbar region: Secondary | ICD-10-CM | POA: Insufficient documentation

## 2021-03-05 ENCOUNTER — Other Ambulatory Visit: Payer: Self-pay

## 2021-03-05 ENCOUNTER — Encounter: Payer: Self-pay | Admitting: Orthopaedic Surgery

## 2021-03-05 ENCOUNTER — Ambulatory Visit (INDEPENDENT_AMBULATORY_CARE_PROVIDER_SITE_OTHER): Payer: Medicare Other | Admitting: Orthopaedic Surgery

## 2021-03-05 VITALS — Ht 62.0 in | Wt 177.0 lb

## 2021-03-05 DIAGNOSIS — M7062 Trochanteric bursitis, left hip: Secondary | ICD-10-CM | POA: Diagnosis not present

## 2021-03-05 DIAGNOSIS — B351 Tinea unguium: Secondary | ICD-10-CM

## 2021-03-05 NOTE — Progress Notes (Signed)
PROCEDURE NOTE:  The patient request injection, verbal consent was obtained.  The left trochanteric area of the hip was prepped appropriately after time out was performed.   Sterile technique was observed and injection of 1 cc of Celestone 6 mg with several cc's of plain xylocaine. Anesthesia was provided by ethyl chloride and a 20-gauge needle was used to inject the hip area. The injection was tolerated well.  A band aid dressing was applied.  The patient was advised to apply ice later today and tomorrow to the injection sight as needed.  I pared her nails while she was here.  Return as needed.  Call if any problem.  Precautions discussed.   Electronically Signed Sanjuana Kava, MD 4/19/202210:36 AM

## 2021-03-21 DIAGNOSIS — G9589 Other specified diseases of spinal cord: Secondary | ICD-10-CM | POA: Diagnosis not present

## 2021-03-21 DIAGNOSIS — M4802 Spinal stenosis, cervical region: Secondary | ICD-10-CM | POA: Diagnosis not present

## 2021-03-21 DIAGNOSIS — G959 Disease of spinal cord, unspecified: Secondary | ICD-10-CM | POA: Diagnosis not present

## 2021-03-21 DIAGNOSIS — Z4789 Encounter for other orthopedic aftercare: Secondary | ICD-10-CM | POA: Diagnosis not present

## 2021-03-21 DIAGNOSIS — Z981 Arthrodesis status: Secondary | ICD-10-CM | POA: Diagnosis not present

## 2021-04-05 DIAGNOSIS — M4802 Spinal stenosis, cervical region: Secondary | ICD-10-CM | POA: Diagnosis not present

## 2021-04-05 DIAGNOSIS — T84418D Breakdown (mechanical) of other internal orthopedic devices, implants and grafts, subsequent encounter: Secondary | ICD-10-CM | POA: Diagnosis not present

## 2021-04-05 DIAGNOSIS — M4712 Other spondylosis with myelopathy, cervical region: Secondary | ICD-10-CM | POA: Diagnosis not present

## 2021-04-22 ENCOUNTER — Encounter (HOSPITAL_COMMUNITY): Payer: Self-pay

## 2021-04-22 ENCOUNTER — Other Ambulatory Visit: Payer: Self-pay

## 2021-04-22 ENCOUNTER — Emergency Department (HOSPITAL_COMMUNITY)
Admission: EM | Admit: 2021-04-22 | Discharge: 2021-04-22 | Disposition: A | Discharge status: Home / Self Care | Payer: Medicare HMO | Attending: Emergency Medicine | Admitting: Emergency Medicine

## 2021-04-22 DIAGNOSIS — E039 Hypothyroidism, unspecified: Secondary | ICD-10-CM | POA: Diagnosis not present

## 2021-04-22 DIAGNOSIS — Z743 Need for continuous supervision: Secondary | ICD-10-CM | POA: Diagnosis not present

## 2021-04-22 DIAGNOSIS — N184 Chronic kidney disease, stage 4 (severe): Secondary | ICD-10-CM | POA: Diagnosis not present

## 2021-04-22 DIAGNOSIS — I129 Hypertensive chronic kidney disease with stage 1 through stage 4 chronic kidney disease, or unspecified chronic kidney disease: Secondary | ICD-10-CM | POA: Diagnosis not present

## 2021-04-22 DIAGNOSIS — M1711 Unilateral primary osteoarthritis, right knee: Secondary | ICD-10-CM | POA: Diagnosis not present

## 2021-04-22 DIAGNOSIS — Z79899 Other long term (current) drug therapy: Secondary | ICD-10-CM | POA: Diagnosis not present

## 2021-04-22 DIAGNOSIS — Z7722 Contact with and (suspected) exposure to environmental tobacco smoke (acute) (chronic): Secondary | ICD-10-CM | POA: Insufficient documentation

## 2021-04-22 DIAGNOSIS — Z96653 Presence of artificial knee joint, bilateral: Secondary | ICD-10-CM | POA: Insufficient documentation

## 2021-04-22 DIAGNOSIS — G8250 Quadriplegia, unspecified: Secondary | ICD-10-CM | POA: Diagnosis not present

## 2021-04-22 DIAGNOSIS — M1712 Unilateral primary osteoarthritis, left knee: Secondary | ICD-10-CM | POA: Diagnosis not present

## 2021-04-22 DIAGNOSIS — Z7982 Long term (current) use of aspirin: Secondary | ICD-10-CM | POA: Insufficient documentation

## 2021-04-22 DIAGNOSIS — M159 Polyosteoarthritis, unspecified: Secondary | ICD-10-CM

## 2021-04-22 DIAGNOSIS — M170 Bilateral primary osteoarthritis of knee: Secondary | ICD-10-CM | POA: Diagnosis not present

## 2021-04-22 DIAGNOSIS — M1009 Idiopathic gout, multiple sites: Secondary | ICD-10-CM | POA: Diagnosis not present

## 2021-04-22 DIAGNOSIS — B1920 Unspecified viral hepatitis C without hepatic coma: Secondary | ICD-10-CM | POA: Diagnosis not present

## 2021-04-22 DIAGNOSIS — M8949 Other hypertrophic osteoarthropathy, multiple sites: Secondary | ICD-10-CM

## 2021-04-22 DIAGNOSIS — M25561 Pain in right knee: Secondary | ICD-10-CM | POA: Diagnosis present

## 2021-04-22 DIAGNOSIS — K219 Gastro-esophageal reflux disease without esophagitis: Secondary | ICD-10-CM | POA: Diagnosis not present

## 2021-04-22 DIAGNOSIS — I10 Essential (primary) hypertension: Secondary | ICD-10-CM | POA: Diagnosis not present

## 2021-04-22 DIAGNOSIS — M79606 Pain in leg, unspecified: Secondary | ICD-10-CM | POA: Diagnosis not present

## 2021-04-22 MED ORDER — NAPROXEN 250 MG PO TABS: 250.0000 mg | ORAL_TABLET | Freq: Once | ORAL | Status: DC

## 2021-04-22 MED ORDER — HYDROCODONE-ACETAMINOPHEN 5-325 MG PO TABS
2.0000 | ORAL_TABLET | Freq: Once | ORAL | Status: AC
  Administered 2021-04-22: 2 via ORAL
  Filled 2021-04-22: qty 2

## 2021-04-22 MED ORDER — ACETAMINOPHEN 325 MG PO TABS
650.0000 mg | ORAL_TABLET | Freq: Once | ORAL | Status: AC
  Administered 2021-04-22: 650 mg via ORAL
  Filled 2021-04-22: qty 2

## 2021-04-22 NOTE — ED Provider Notes (Signed)
[facility] EMERGENCY DEPARTMENT Provider Note   CSN: [id number] Arrival date & time: 04/22/21  0553     History Chief Complaint  Patient presents with  . bilateral knee pain    Colleen Nunez is a 65 y.o. female.  HPI     65 year old female comes in with chief complaint of bilateral knee pain.  Patient has history of rheumatoid arthritis, chronic hip and knee pain.  She reports that she spent her night with her friend, did not take her Norco with her and could not sleep well.  The last few days her pain has been worse because of weather change.  She is supposed to see her PCP later this week.  In the interim, she is just hoping we can get her something for pain so that it eases off before she gets home.  Patient primarily is hurting over both of her knees right now.  Denies any nausea, vomiting, fevers, chills or any trauma.  Past Medical History:  Diagnosis Date  . Anemia   . Anxiety   . Arthritis   . Chronic hip pain   . Chronic knee pain   . Depression    h/o suicide attempts in the past  . GERD (gastroesophageal reflux disease)   . Gout   . Hepatitis C antibody test positive       . Hypertension   . Polysubstance abuse ([place])    h/o  . Recurrent falls   . Renal insufficiency     Patient Active Problem List   Diagnosis Date Noted  . Long term (current) use of opiate analgesic 01/21/2020  . Knee pain 01/21/2020  . Pain radiating to neck 01/21/2020  . Status post total right knee replacement 11/24/2018  . Alcohol use 10/23/2018  . Polyarticular arthritis 10/07/2018  . Low back pain 03/31/2017  . Status post cervical spinal fusion 03/31/2017  . S/P left unicompartmental knee replacement 08/15/2015  . Anemia 08/06/2015  . Stage 3 chronic kidney disease ([place]) 08/06/2015  . Hepatitis C 03/02/2015  . Spondylosis, cervical, with myelopathy 09/21/2013  . Quadriplegia ([place]) 09/21/2013  . Osteoarthritis of left knee 09/21/2013  . Hypokalemia 08/16/2013  . AKI  (acute kidney injury) ([place]) 08/16/2013  . Acute kidney failure, unspecified ([place]) 08/16/2013  . UTI (lower urinary tract infection) 08/07/2013  . Nausea & vomiting 08/07/2013  . Chronic kidney disease, stage IV (severe) ([place]) 08/07/2013  . Cervical myelopathy ([place]) 08/07/2013  . Osteoarthrosis, unspecified whether generalized or localized, involving lower leg 06/03/2013  . GERD 06/12/2009  . CHEST PAIN 06/12/2009  . LEG PAIN, BILATERAL 05/28/2009  . OBESITY 12/17/2007  . DYSPHAGIA UNSPECIFIED 12/03/2007  . ANEMIA, NORMOCYTIC 07/13/2007  . ALCOHOL ABUSE 07/13/2007  . RENAL DISEASE, CHRONIC, STAGE II 07/13/2007  . Alcohol abuse 07/13/2007  . ANXIETY 06/29/2007  . DEPRESSION 06/29/2007  . Essential hypertension 06/29/2007  . CONSTIPATION 06/29/2007  . ARTHRITIS 06/29/2007  . MALAISE AND FATIGUE 06/29/2007    Past Surgical History:  Procedure Laterality Date  . ABDOMINAL HYSTERECTOMY    . ANTERIOR CERVICAL CORPECTOMY N/A 06/09/2013   Procedure: C4 C5 Corpectomy with C6-7 Anterior cervical fusion/Peek cage/Trestle plate;  Surgeon: Otilio Connors, MD;  Location: [facility];  Service: Neurosurgery;  Laterality: N/A;  Cervical Four, Cervical Five Corpectomy and Cervical six-seven Anterior cervical fusion/Peek cage Three-Five /Trestle Plating Cervical Three to Cervical Seven  . ANTERIOR CERVICAL DECOMP/DISCECTOMY FUSION N/A 08/12/2013   Procedure: Repair of Anterior  Cervical CSF LEAK, lumbar drain placement.;  Surgeon: Floyce Stakes, MD;  Location: [facility];  Service: Neurosurgery;  Laterality: N/A;  . bilateral SOO and appendectomy  2006  . cartilage removal     left knee  . COLONOSCOPY N/A 07/04/2015   Procedure: COLONOSCOPY;  Surgeon: Rogene Houston, MD;  [facility];  Service: Endoscopy;  Laterality: N/A;  100  . KNEE ARTHROSCOPY    . X-STOP IMPLANTATION       OB History   No obstetric history on file.     Family History  Problem Relation Age of Onset  .  Diabetes Sister   . Diabetes Mother        deceased age 44  . Kidney failure Mother   . Alcohol abuse Father   . Colon cancer Neg Hx     Social History   Tobacco Use  . Smoking status: Passive Smoke Exposure - Never Smoker  . Smokeless tobacco: Never Used  Vaping Use  . Vaping Use: Never used  Substance Use Topics  . Alcohol use: Yes    Alcohol/week: 3.0 standard drinks    Types: 3 Cans of beer per week    Comment: occasionally  . Drug use: Not Currently    Comment: h/o crack cocaine in past. Clean since 2006    Home Medications Prior to Admission medications   Medication Sig Start Date End Date Taking? Authorizing Provider  acetaminophen (TYLENOL) 500 MG tablet Take 500 mg by mouth every 6 (six) hours as needed for mild pain or moderate pain.    [provider]  amLODipine (NORVASC) 10 MG tablet Take 0.5 tablets (5 mg total) by mouth daily. Patient taking differently: Take 10 mg by mouth daily. 09/02/13   LoveIvan Anchors, PA-C  aspirin 325 MG EC tablet Take 325 mg by mouth daily.    [provider]  calcium-vitamin D (OSCAL WITH D) 500-200 MG-UNIT tablet Take 1 tablet by mouth.    [provider]  DULoxetine (CYMBALTA) 20 MG capsule Take 20 mg by mouth daily.    [provider]  gabapentin (NEURONTIN) 300 MG capsule Take 300 mg by mouth 2 (two) times daily as needed (for pain).    [provider]  HYDROcodone-acetaminophen (NORCO/VICODIN) 5-325 MG tablet Take 1 tablet by mouth 2 (two) times daily as needed for moderate pain. 05/28/20   Sanjuana Kava, MD  hydrOXYzine (ATARAX/VISTARIL) 25 MG tablet Take 25 mg by mouth 3 (three) times daily as needed.    [provider]  iron polysaccharides (NIFEREX) 150 MG capsule Take 150 mg by mouth daily.    [provider]  lisinopril (ZESTRIL) 5 MG tablet lisinopril 5 mg tablet  Take 1 tablet every day by oral route.    [provider]  MITIGARE 0.6 MG CAPS Take 1  capsule by mouth 2 (two) times daily. 09/07/20   [provider]  naloxegol oxalate (MOVANTIK) 25 MG TABS tablet Take by mouth daily.    [provider]  naproxen (NAPROSYN) 500 MG tablet Take 1 tablet (500 mg total) by mouth 2 (two) times daily. 05/27/18   Evalee Jefferson, PA-C  nystatin (MYCOSTATIN/NYSTOP) powder APPLY TO AFFECTED AREA 2UTIMES A DAY. 12/07/19   [provider]  potassium chloride (K-DUR) 10 MEQ tablet Take 10 mEq by mouth daily.    [provider]  predniSONE (DELTASONE) 20 MG tablet Take 20 mg by mouth daily. 09/07/20   [provider]  senna (SENOKOT) 8.6 MG tablet Take 1 tablet  by mouth daily.    [provider]  traMADol (ULTRAM) 50 MG tablet Take 1 tablet (50 mg total) by mouth every 6 (six) hours as needed. 06/26/19   Triplett, [name], PA-C    Allergies    Aspirin and Oxycodone  Review of Systems   Review of Systems  Constitutional: Positive for activity change. Negative for fever.  Gastrointestinal: Negative for nausea and vomiting.  Musculoskeletal: Positive for arthralgias.  Skin: Negative for rash.  Allergic/Immunologic: Negative for immunocompromised state.    Physical Exam Updated Vital Signs BP (!) 159/118   Pulse (!) 101   Temp 98.7 F (37.1 C) (Oral)   Resp 17   Ht 5\' 2"  (1.575 m)   Wt 81 kg   SpO2 100%   BMI 32.66 kg/m   Physical Exam Vitals and nursing note reviewed.  Constitutional:      Appearance: She is well-developed.  HENT:     Head: Normocephalic and atraumatic.  Cardiovascular:     Rate and Rhythm: Normal rate.  Pulmonary:     Effort: Pulmonary effort is normal.  Abdominal:     General: Bowel sounds are normal.  Musculoskeletal:        General: Swelling and deformity present.     Cervical back: Normal range of motion and neck supple.     Comments: Patient has multiple deformed joints.  Over the knee, there is no gross edema, erythema.  Patient able to flex and extend the knee on  her own, although the range of motion is limited.  Skin:    General: Skin is warm and dry.  Neurological:     Mental Status: She is alert and oriented to person, place, and time.     ED Results / Procedures / Treatments   Labs (all labs ordered are listed, but only abnormal results are displayed) Labs Reviewed - No data to display  EKG None  Radiology No results found.  Procedures Procedures   Medications Ordered in ED Medications  acetaminophen (TYLENOL) tablet 650 mg (has no administration in time range)  HYDROcodone-acetaminophen (NORCO/VICODIN) 5-325 MG per tablet 2 tablet (2 tablets Oral Given 04/22/21 4332)    ED Course  I have reviewed the triage vital signs and the nursing notes.  Pertinent labs & imaging results that were available during my care of the patient were reviewed by me and considered in my medical decision making (see chart for details).    MDM Rules/Calculators/A&P                          Patient with history of RA and chronic knee and hip pain comes in with chief complaint of knee pain. No evidence of infection.  No trauma. Given Norco right now, pain is easing off.  We will add Tylenol and discharge.  Patient reports she will go home and take her home pain meds.  She has PCP follow-up coming up later this week which is reassuring.  Final Clinical Impression(s) / ED Diagnoses Final diagnoses:  Primary osteoarthritis involving multiple joints    Rx / DC Orders ED Discharge Orders    None       Varney Biles, MD 04/22/21 [phone]

## 2021-04-22 NOTE — Discharge Instructions (Addendum)
You were given some tylebol and hydrocodone in the ER. The pain should start easing further.  If the pain continues to be an issue, discuss it with your primary care doctor at the clinic visit.

## 2021-04-22 NOTE — ED Triage Notes (Signed)
[facility]- pt has arthritis in bilateral knees and complaining of pain. Pt ambulatory per EMS.

## 2021-04-22 NOTE — ED Provider Notes (Signed)
Emergency Medicine Provider Triage Evaluation Note  Colleen Nunez , a 65 y.o. female  was evaluated in triage.  Pt complains of bilateral leg pain.  No recent trauma  Review of Systems  Positive: Leg pain Negative: fever  Physical Exam  BP (!) 123/102 (BP Location: Left Arm)   Pulse 98   Temp 98.7 F (37.1 C) (Oral)   Resp 17   Ht 1.575 m (5\' 2" )   Wt 81 kg   SpO2 99%   BMI 32.66 kg/m  Gen:   Awake, no distress  anxious Resp:  Normal effort  MSK:   Moves extremities without difficulty  Other:  Distal pulses intact  Medical Decision Making  Medically screening exam initiated at 6:35 AM.  Appropriate orders placed.  Colleen Nunez was informed that the remainder of the evaluation will be completed by another provider, this initial triage assessment does not replace that evaluation, and the importance of remaining in the ED until their evaluation is complete.     Ripley Fraise, MD 04/22/21 [phone]

## 2021-04-24 DIAGNOSIS — I10 Essential (primary) hypertension: Secondary | ICD-10-CM | POA: Diagnosis not present

## 2021-04-24 DIAGNOSIS — M159 Polyosteoarthritis, unspecified: Secondary | ICD-10-CM | POA: Diagnosis not present

## 2021-04-24 DIAGNOSIS — N184 Chronic kidney disease, stage 4 (severe): Secondary | ICD-10-CM | POA: Diagnosis not present

## 2021-04-24 DIAGNOSIS — K219 Gastro-esophageal reflux disease without esophagitis: Secondary | ICD-10-CM | POA: Diagnosis not present

## 2021-04-25 ENCOUNTER — Telehealth: Payer: Self-pay | Admitting: Orthopaedic Surgery

## 2021-04-25 NOTE — Telephone Encounter (Signed)
Reached patient; scheduled appointment; patient aware.

## 2021-04-25 NOTE — Telephone Encounter (Signed)
Patient called left message she wants to be seen by Dr. Luna Glasgow today about her knees.   I called the patient back unable to leave voicemail.   Mailbox not set up at this time.

## 2021-04-30 ENCOUNTER — Other Ambulatory Visit: Payer: Self-pay

## 2021-04-30 ENCOUNTER — Ambulatory Visit (INDEPENDENT_AMBULATORY_CARE_PROVIDER_SITE_OTHER): Payer: Medicare HMO | Admitting: Orthopaedic Surgery

## 2021-04-30 ENCOUNTER — Encounter: Payer: Self-pay | Admitting: Orthopaedic Surgery

## 2021-04-30 VITALS — BP 140/83 | HR 80 | Ht 62.0 in | Wt 178.0 lb

## 2021-04-30 DIAGNOSIS — M7062 Trochanteric bursitis, left hip: Secondary | ICD-10-CM

## 2021-04-30 DIAGNOSIS — B351 Tinea unguium: Secondary | ICD-10-CM

## 2021-04-30 NOTE — Progress Notes (Signed)
Patient Colleen Nunez, female DOB:15-Jan-1956, 65 y.o. YDX:[id number]  Chief Complaint  Patient presents with   Knee Pain    Both wants right injection    Nail Problem    Wants trimmed    Fall    Last pm     HPI  Colleen Nunez is a 65 y.o. female who fell recently and had more pain of the hip on the left.  She has some pain in the knees.  She has had total joint replacements of both knees in the past at [facility].  She is walking well.  She has toenail fungus and long toe nails.   Body mass index is 32.56 kg/m.  ROS  Review of Systems  Constitutional:  Positive for activity change.  Musculoskeletal:  Positive for arthralgias, gait problem and joint swelling.   All other systems reviewed and are negative.  The following is a summary of the past history medically, past history surgically, known current medicines, social history and family history.  This information is gathered electronically by the computer from prior information and documentation.  I review this each visit and have found including this information at this point in the chart is beneficial and informative.    Past Medical History:  Diagnosis Date   Anemia    Anxiety    Arthritis    Chronic hip pain    Chronic knee pain    Depression    h/o suicide attempts in the past   GERD (gastroesophageal reflux disease)    Gout    Hepatitis C antibody test positive        Hypertension    Polysubstance abuse ([place])    h/o   Recurrent falls    Renal insufficiency     Past Surgical History:  Procedure Laterality Date   ABDOMINAL HYSTERECTOMY     ANTERIOR CERVICAL CORPECTOMY N/A 06/09/2013   Procedure: C4 C5 Corpectomy with C6-7 Anterior cervical fusion/Peek cage/Trestle plate;  Surgeon: Otilio Connors, MD;  [facility];  Service: Neurosurgery;  Laterality: N/A;  Cervical Four, Cervical Five Corpectomy and Cervical six-seven Anterior cervical fusion/Peek cage Three-Five /Trestle Plating  Cervical Three to Cervical Seven   ANTERIOR CERVICAL DECOMP/DISCECTOMY FUSION N/A 08/12/2013   Procedure: Repair of Anterior  Cervical CSF LEAK, lumbar drain placement.;  Surgeon: Floyce Stakes, MD;  Location: [place];  Service: Neurosurgery;  Laterality: N/A;   bilateral SOO and appendectomy  2006   cartilage removal     left knee   COLONOSCOPY N/A 07/04/2015   Procedure: COLONOSCOPY;  Surgeon: Rogene Houston, MD;  [facility];  Service: Endoscopy;  Laterality: N/A;  100   KNEE ARTHROSCOPY     X-STOP IMPLANTATION      Family History  Problem Relation Age of Onset   Diabetes Sister    Diabetes Mother        deceased age 34   Kidney failure Mother    Alcohol abuse Father    Colon cancer Neg Hx     Social History Social History   Tobacco Use   Smoking status: Passive Smoke Exposure - Never Smoker   Smokeless tobacco: Never  Vaping Use   Vaping Use: Never used  Substance Use Topics   Alcohol use: Yes    Alcohol/week: 3.0 standard drinks    Types: 3 Cans of beer per week    Comment: occasionally   Drug use: Not Currently    Comment: h/o crack cocaine  in past. Clean since 2006    Allergies  Allergen Reactions   Aspirin     REACTION: Hx of overdose on this years ago - too scared to take. Tried to committ suicide.   Oxycodone Nausea And Vomiting    Current Outpatient Medications  Medication Sig Dispense Refill   acetaminophen (TYLENOL) 500 MG tablet Take 500 mg by mouth every 6 (six) hours as needed for mild pain or moderate pain.     amLODipine (NORVASC) 10 MG tablet Take 0.5 tablets (5 mg total) by mouth daily. (Patient taking differently: Take 10 mg by mouth daily.)     aspirin 325 MG EC tablet Take 325 mg by mouth daily.     calcium-vitamin D (OSCAL WITH D) 500-200 MG-UNIT tablet Take 1 tablet by mouth.     DULoxetine (CYMBALTA) 20 MG capsule Take 20 mg by mouth daily.     gabapentin (NEURONTIN) 300 MG capsule Take 300 mg by mouth 2 (two) times daily as  needed (for pain).     HYDROcodone-acetaminophen (NORCO/VICODIN) 5-325 MG tablet Take 1 tablet by mouth 2 (two) times daily as needed for moderate pain. 30 tablet 0   hydrOXYzine (ATARAX/VISTARIL) 25 MG tablet Take 25 mg by mouth 3 (three) times daily as needed.     iron polysaccharides (NIFEREX) 150 MG capsule Take 150 mg by mouth daily.     lisinopril (ZESTRIL) 5 MG tablet lisinopril 5 mg tablet  Take 1 tablet every day by oral route.     MITIGARE 0.6 MG CAPS Take 1 capsule by mouth 2 (two) times daily.     naloxegol oxalate (MOVANTIK) 25 MG TABS tablet Take by mouth daily.     naproxen (NAPROSYN) 500 MG tablet Take 1 tablet (500 mg total) by mouth 2 (two) times daily. 20 tablet 0   nystatin (MYCOSTATIN/NYSTOP) powder APPLY TO AFFECTED AREA 2UTIMES A DAY.     potassium chloride (K-DUR) 10 MEQ tablet Take 10 mEq by mouth daily.     predniSONE (DELTASONE) 20 MG tablet Take 20 mg by mouth daily.     senna (SENOKOT) 8.6 MG tablet Take 1 tablet by mouth daily.     traMADol (ULTRAM) 50 MG tablet Take 1 tablet (50 mg total) by mouth every 6 (six) hours as needed. 10 tablet 0   No current facility-administered medications for this visit.     Physical Exam  Blood pressure 140/83, pulse 80, height 5\' 2"  (1.575 m), weight 178 lb (80.7 kg).  Constitutional: overall normal hygiene, normal nutrition, well developed, normal grooming, normal body habitus. Assistive device:crutches  Musculoskeletal: gait and station Limp right, muscle tone and strength are normal, no tremors or atrophy is present.  .  Neurological: coordination overall normal.  Deep tendon reflex/nerve stretch intact.  Sensation normal.  Cranial nerves II-XII intact.   Skin:   Normal overall no scars, lesions, ulcers or rashes. No psoriasis.  Psychiatric: Alert and oriented x 3.  Recent memory intact, remote memory unclear.  Normal mood and affect. Well groomed.  Good eye contact.  Cardiovascular: overall no swelling, no  varicosities, no edema bilaterally, normal temperatures of the legs and arms, no clubbing, cyanosis and good capillary refill.  Lymphatic: palpation is normal.  All other systems reviewed and are negative   The patient has been educated about the nature of the problem(s) and counseled on treatment options.  The patient appeared to understand what I have discussed and is in agreement with it.  Encounter Diagnoses  Name  Primary?   Trochanteric bursitis of left hip Yes   Toenail fungus     PLAN Call if any problems.  Precautions discussed.  Continue current medications.   Return to clinic  prn   I pared her toe nails.  Return as needed.  Electronically Signed Sanjuana Kava, MD 6/14/20228:28 AM

## 2021-05-06 ENCOUNTER — Other Ambulatory Visit: Payer: Self-pay

## 2021-05-06 ENCOUNTER — Ambulatory Visit (HOSPITAL_COMMUNITY)
Admission: RE | Admit: 2021-05-06 | Discharge: 2021-05-06 | Disposition: A | Discharge status: Home / Self Care | Payer: Medicare HMO | Source: Ambulatory Visit | Attending: Gerontology | Admitting: Gerontology

## 2021-05-06 ENCOUNTER — Other Ambulatory Visit (HOSPITAL_COMMUNITY): Payer: Self-pay | Admitting: Gerontology

## 2021-05-06 DIAGNOSIS — Z1231 Encounter for screening mammogram for malignant neoplasm of breast: Secondary | ICD-10-CM

## 2021-05-07 DIAGNOSIS — Z96653 Presence of artificial knee joint, bilateral: Secondary | ICD-10-CM | POA: Diagnosis not present

## 2021-05-07 DIAGNOSIS — M1712 Unilateral primary osteoarthritis, left knee: Secondary | ICD-10-CM | POA: Diagnosis not present

## 2021-05-07 DIAGNOSIS — Z885 Allergy status to narcotic agent status: Secondary | ICD-10-CM | POA: Diagnosis not present

## 2021-05-07 DIAGNOSIS — M85861 Other specified disorders of bone density and structure, right lower leg: Secondary | ICD-10-CM | POA: Diagnosis not present

## 2021-05-07 DIAGNOSIS — M11262 Other chondrocalcinosis, left knee: Secondary | ICD-10-CM | POA: Diagnosis not present

## 2021-05-07 DIAGNOSIS — M25461 Effusion, right knee: Secondary | ICD-10-CM | POA: Diagnosis not present

## 2021-05-07 DIAGNOSIS — Z471 Aftercare following joint replacement surgery: Secondary | ICD-10-CM | POA: Diagnosis not present

## 2021-05-07 DIAGNOSIS — M7050 Other bursitis of knee, unspecified knee: Secondary | ICD-10-CM | POA: Diagnosis not present

## 2021-05-15 DIAGNOSIS — M24542 Contracture, left hand: Secondary | ICD-10-CM | POA: Insufficient documentation

## 2021-05-15 DIAGNOSIS — M24541 Contracture, right hand: Secondary | ICD-10-CM | POA: Diagnosis not present

## 2021-05-17 DIAGNOSIS — Z20822 Contact with and (suspected) exposure to covid-19: Secondary | ICD-10-CM | POA: Diagnosis not present

## 2021-05-18 DIAGNOSIS — Z20822 Contact with and (suspected) exposure to covid-19: Secondary | ICD-10-CM | POA: Diagnosis not present

## 2021-05-24 DIAGNOSIS — M159 Polyosteoarthritis, unspecified: Secondary | ICD-10-CM | POA: Diagnosis not present

## 2021-05-24 DIAGNOSIS — I10 Essential (primary) hypertension: Secondary | ICD-10-CM | POA: Diagnosis not present

## 2021-05-27 DIAGNOSIS — Z20822 Contact with and (suspected) exposure to covid-19: Secondary | ICD-10-CM | POA: Diagnosis not present

## 2021-05-31 DIAGNOSIS — Z20822 Contact with and (suspected) exposure to covid-19: Secondary | ICD-10-CM | POA: Diagnosis not present

## 2021-06-05 DIAGNOSIS — Z20822 Contact with and (suspected) exposure to covid-19: Secondary | ICD-10-CM | POA: Diagnosis not present

## 2021-08-17 DIAGNOSIS — I10 Essential (primary) hypertension: Secondary | ICD-10-CM | POA: Diagnosis not present

## 2021-08-17 DIAGNOSIS — M159 Polyosteoarthritis, unspecified: Secondary | ICD-10-CM | POA: Diagnosis not present

## 2021-09-17 DIAGNOSIS — I10 Essential (primary) hypertension: Secondary | ICD-10-CM | POA: Diagnosis not present

## 2021-09-17 DIAGNOSIS — N184 Chronic kidney disease, stage 4 (severe): Secondary | ICD-10-CM | POA: Diagnosis not present

## 2021-09-24 ENCOUNTER — Other Ambulatory Visit (HOSPITAL_COMMUNITY): Payer: Self-pay | Admitting: Neurology

## 2021-09-24 DIAGNOSIS — B372 Candidiasis of skin and nail: Secondary | ICD-10-CM | POA: Diagnosis not present

## 2021-09-24 DIAGNOSIS — M542 Cervicalgia: Secondary | ICD-10-CM | POA: Diagnosis not present

## 2021-09-24 DIAGNOSIS — K5903 Drug induced constipation: Secondary | ICD-10-CM | POA: Diagnosis not present

## 2021-09-24 DIAGNOSIS — Z79891 Long term (current) use of opiate analgesic: Secondary | ICD-10-CM | POA: Diagnosis not present

## 2021-09-24 DIAGNOSIS — M545 Low back pain, unspecified: Secondary | ICD-10-CM | POA: Diagnosis not present

## 2021-09-24 DIAGNOSIS — M130 Polyarthritis, unspecified: Secondary | ICD-10-CM | POA: Diagnosis not present

## 2021-09-24 DIAGNOSIS — M25571 Pain in right ankle and joints of right foot: Secondary | ICD-10-CM

## 2021-09-24 DIAGNOSIS — I10 Essential (primary) hypertension: Secondary | ICD-10-CM | POA: Diagnosis not present

## 2021-09-24 DIAGNOSIS — M25569 Pain in unspecified knee: Secondary | ICD-10-CM | POA: Diagnosis not present

## 2021-09-25 ENCOUNTER — Other Ambulatory Visit: Payer: Self-pay

## 2021-09-25 ENCOUNTER — Ambulatory Visit (HOSPITAL_COMMUNITY)
Admission: RE | Admit: 2021-09-25 | Discharge: 2021-09-25 | Disposition: A | Discharge status: Home / Self Care | Payer: Medicare HMO | Source: Ambulatory Visit | Attending: Neurology | Admitting: Neurology

## 2021-09-25 DIAGNOSIS — M7989 Other specified soft tissue disorders: Secondary | ICD-10-CM | POA: Diagnosis not present

## 2021-09-25 DIAGNOSIS — M25571 Pain in right ankle and joints of right foot: Secondary | ICD-10-CM | POA: Diagnosis not present

## 2021-10-17 DIAGNOSIS — M1009 Idiopathic gout, multiple sites: Secondary | ICD-10-CM | POA: Diagnosis not present

## 2021-10-17 DIAGNOSIS — I10 Essential (primary) hypertension: Secondary | ICD-10-CM | POA: Diagnosis not present

## 2021-11-13 DIAGNOSIS — M24542 Contracture, left hand: Secondary | ICD-10-CM | POA: Diagnosis not present

## 2021-11-13 DIAGNOSIS — M79672 Pain in left foot: Secondary | ICD-10-CM | POA: Diagnosis not present

## 2021-11-13 DIAGNOSIS — M79671 Pain in right foot: Secondary | ICD-10-CM | POA: Diagnosis not present

## 2021-11-17 DIAGNOSIS — M159 Polyosteoarthritis, unspecified: Secondary | ICD-10-CM | POA: Diagnosis not present

## 2021-11-17 DIAGNOSIS — I10 Essential (primary) hypertension: Secondary | ICD-10-CM | POA: Diagnosis not present

## 2021-12-10 DIAGNOSIS — M10071 Idiopathic gout, right ankle and foot: Secondary | ICD-10-CM | POA: Diagnosis not present

## 2021-12-10 DIAGNOSIS — M10072 Idiopathic gout, left ankle and foot: Secondary | ICD-10-CM | POA: Diagnosis not present

## 2021-12-10 DIAGNOSIS — M109 Gout, unspecified: Secondary | ICD-10-CM | POA: Diagnosis not present

## 2021-12-16 ENCOUNTER — Emergency Department (HOSPITAL_COMMUNITY)
Admission: EM | Admit: 2021-12-16 | Discharge: 2021-12-17 | Disposition: A | Discharge status: Home / Self Care | Payer: Medicare HMO | Attending: Emergency Medicine | Admitting: Emergency Medicine

## 2021-12-16 ENCOUNTER — Other Ambulatory Visit: Payer: Self-pay

## 2021-12-16 ENCOUNTER — Encounter (HOSPITAL_COMMUNITY): Payer: Self-pay | Admitting: Emergency Medicine

## 2021-12-16 DIAGNOSIS — M545 Low back pain, unspecified: Secondary | ICD-10-CM | POA: Diagnosis not present

## 2021-12-16 DIAGNOSIS — R269 Unspecified abnormalities of gait and mobility: Secondary | ICD-10-CM | POA: Diagnosis not present

## 2021-12-16 DIAGNOSIS — G8250 Quadriplegia, unspecified: Secondary | ICD-10-CM | POA: Diagnosis not present

## 2021-12-16 DIAGNOSIS — Z7982 Long term (current) use of aspirin: Secondary | ICD-10-CM | POA: Diagnosis not present

## 2021-12-16 MED ORDER — METHOCARBAMOL 500 MG PO TABS
500.0000 mg | ORAL_TABLET | Freq: Once | ORAL | Status: AC
Start: 2021-12-16 — End: 2021-12-16
  Administered 2021-12-16: 500 mg via ORAL
  Filled 2021-12-16: qty 1

## 2021-12-16 NOTE — ED Notes (Signed)
Patient states she took a hydrocodone PTA.

## 2021-12-16 NOTE — ED Triage Notes (Signed)
Patient states today she was walking and turned a certain way and concerned for pulling a muscle in her back. Pain to mid right back.

## 2021-12-16 NOTE — ED Provider Triage Note (Signed)
Emergency Medicine Provider Triage Evaluation Note  Colleen Nunez , a 66 y.o. female  was evaluated in triage.  Pt complains of right-sided back pain.  Patient states that she walked on the stairs in her house, and bent over to pick up close and had difficulty standing up straight.  She feels that she pulled a muscle in the right side of her back.  She tried a lidocaine patch, and topical gel, and pain pill and none of these helped. No other trauma noted.   Review of Systems  Positive: Back pain Negative: Numbness, tingling, urinary symptoms  Physical Exam  BP 121/73    Pulse (!) 58    Temp 98 F (36.7 C) (Oral)    Resp 16    Ht 5\' 2"  (1.575 m)    Wt 79.4 kg    SpO2 100%    BMI 32.01 kg/m  Gen:   Awake, no distress   Resp:  Normal effort  MSK:   Moves extremities without difficulty  Other:    Medical Decision Making  Medically screening exam initiated at 6:12 PM.  Appropriate orders placed.  Colleen Nunez was informed that the remainder of the evaluation will be completed by another provider, this initial triage assessment does not replace that evaluation, and the importance of remaining in the ED until their evaluation is complete.     Costella Schwarz T, PA-C 12/16/21 1820

## 2021-12-17 DIAGNOSIS — M545 Low back pain, unspecified: Secondary | ICD-10-CM | POA: Diagnosis not present

## 2021-12-17 MED ORDER — METHOCARBAMOL 500 MG PO TABS
500.0000 mg | ORAL_TABLET | Freq: Once | ORAL | Status: AC
Start: 2021-12-17 — End: 2021-12-17
  Administered 2021-12-17: 500 mg via ORAL
  Filled 2021-12-17: qty 1

## 2021-12-17 MED ORDER — METHOCARBAMOL 500 MG PO TABS: 500.0000 mg | ORAL_TABLET | Freq: Two times a day (BID) | ORAL | 0 refills | Status: DC

## 2021-12-17 MED ORDER — HYDROCODONE-ACETAMINOPHEN 5-325 MG PO TABS
1.0000 | ORAL_TABLET | Freq: Once | ORAL | Status: AC
  Administered 2021-12-17: 1 via ORAL
  Filled 2021-12-17: qty 1

## 2021-12-17 NOTE — ED Provider Notes (Signed)
[place] EMERGENCY DEPARTMENT Provider Note    CSN: [id number] Arrival date & time: 12/16/21 1735  History Chief Complaint  Patient presents with   Back Pain    Colleen Nunez is a 66 y.o. female with history of chronic pain from arthritis reports she bent over to get some clothes out of the dryer yesterday and her back locked up. She has been having severe aching pain in R lower back since then. She was given Robaxin in the waiting room on arrival last night with some improvement. She also takes Hydrocodone on a daily basis for her chronic pain. She denies any fevers. No falls, no difficulty urinating. She would like to find a place to stretch out and rest.    Home Medications Prior to Admission medications   Medication Sig Start Date End Date Taking? Authorizing Provider  methocarbamol (ROBAXIN) 500 MG tablet Take 1 tablet (500 mg total) by mouth 2 (two) times daily. 12/17/21  Yes Truddie Hidden, MD  acetaminophen (TYLENOL) 500 MG tablet Take 500 mg by mouth every 6 (six) hours as needed for mild pain or moderate pain.    [provider]  amLODipine (NORVASC) 10 MG tablet Take 0.5 tablets (5 mg total) by mouth daily. Patient taking differently: Take 10 mg by mouth daily. 09/02/13   LoveIvan Anchors, PA-C  aspirin 325 MG EC tablet Take 325 mg by mouth daily.    [provider]  calcium-vitamin D (OSCAL WITH D) 500-200 MG-UNIT tablet Take 1 tablet by mouth.    [provider]  DULoxetine (CYMBALTA) 20 MG capsule Take 20 mg by mouth daily.    [provider]  gabapentin (NEURONTIN) 300 MG capsule Take 300 mg by mouth 2 (two) times daily as needed (for pain).    [provider]  HYDROcodone-acetaminophen (NORCO/VICODIN) 5-325 MG tablet Take 1 tablet by mouth 2 (two) times daily as needed for moderate pain. 05/28/20   Sanjuana Kava, MD  hydrOXYzine (ATARAX/VISTARIL) 25 MG tablet Take 25 mg by mouth 3 (three) times daily as needed.     [provider]  iron polysaccharides (NIFEREX) 150 MG capsule Take 150 mg by mouth daily.    [provider]  lisinopril (ZESTRIL) 5 MG tablet lisinopril 5 mg tablet  Take 1 tablet every day by oral route.    [provider]  MITIGARE 0.6 MG CAPS Take 1 capsule by mouth 2 (two) times daily. 09/07/20   [provider]  naloxegol oxalate (MOVANTIK) 25 MG TABS tablet Take by mouth daily.    [provider]  naproxen (NAPROSYN) 500 MG tablet Take 1 tablet (500 mg total) by mouth 2 (two) times daily. 05/27/18   Evalee Jefferson, PA-C  nystatin (MYCOSTATIN/NYSTOP) powder APPLY TO AFFECTED AREA 2UTIMES A DAY. 12/07/19   [provider]  potassium chloride (K-DUR) 10 MEQ tablet Take 10 mEq by mouth daily.    [provider]  predniSONE (DELTASONE) 20 MG tablet Take 20 mg by mouth daily. 09/07/20   [provider]  senna (SENOKOT) 8.6 MG tablet Take 1 tablet by mouth daily.    [provider]  traMADol (ULTRAM) 50 MG tablet Take 1 tablet (50 mg total) by mouth every 6 (six) hours as needed. 06/26/19   Triplett, [name], PA-C     Allergies    Aspirin and Oxycodone   Review of Systems   Review of Systems Please see HPI for pertinent positives and negatives  Physical Exam BP  97/75 (BP Location: Left Arm)    Pulse 66    Temp 98 F (36.7 C) (Oral)    Resp 15    Ht 5\' 2"  (1.575 m)    Wt 79.4 kg    SpO2 100%    BMI 32.01 kg/m   Physical Exam Vitals and nursing note reviewed.  Constitutional:      Appearance: Normal appearance.  HENT:     Head: Normocephalic and atraumatic.     Nose: Nose normal.     Mouth/Throat:     Mouth: Mucous membranes are moist.  Eyes:     Extraocular Movements: Extraocular movements intact.     Conjunctiva/sclera: Conjunctivae normal.  Cardiovascular:     Rate and Rhythm: Normal rate.  Pulmonary:     Effort: Pulmonary effort is normal.     Breath sounds: Normal breath sounds.  Abdominal:      General: Abdomen is flat.     Palpations: Abdomen is soft.     Tenderness: There is no abdominal tenderness.  Musculoskeletal:        General: Tenderness (R lower back) present. No swelling. Normal range of motion.     Cervical back: Neck supple.  Skin:    General: Skin is warm and dry.  Neurological:     General: No focal deficit present.     Mental Status: She is alert.     Sensory: No sensory deficit.     Motor: No weakness.     Gait: Gait abnormal (antalgict).  Psychiatric:        Mood and Affect: Mood normal.    ED Results / Procedures / Treatments   EKG None  Procedures Procedures  Medications Ordered in the ED Medications  HYDROcodone-acetaminophen (NORCO/VICODIN) 5-325 MG per tablet 1 tablet (has no administration in time range)  methocarbamol (ROBAXIN) tablet 500 mg (has no administration in time range)  methocarbamol (ROBAXIN) tablet 500 mg (500 mg Oral Given 12/16/21 1836)    Initial Impression and Plan  Patient with MSK back pain. Has CKD so will avoid NSAIDs. She has been waiting for ~15 hours so will give her usual home hydrocodone and another dose of Robaxin while she attempts to find a ride home. She was advised to follow up with her PCP for reassessment.   ED Course       MDM Rules/Calculators/A&P Medical Decision Making Problems Addressed: Acute right-sided low back pain without sciatica: complicated acute illness or injury  Risk Prescription drug management.    Final Clinical Impression(s) / ED Diagnoses Final diagnoses:  Acute right-sided low back pain without sciatica    Rx / DC Orders ED Discharge Orders          Ordered    methocarbamol (ROBAXIN) 500 MG tablet  2 times daily        12/17/21 0842             Truddie Hidden, MD 12/17/21 [phone]

## 2021-12-24 DIAGNOSIS — M159 Polyosteoarthritis, unspecified: Secondary | ICD-10-CM | POA: Diagnosis not present

## 2021-12-24 DIAGNOSIS — I10 Essential (primary) hypertension: Secondary | ICD-10-CM | POA: Diagnosis not present

## 2021-12-24 DIAGNOSIS — R0781 Pleurodynia: Secondary | ICD-10-CM | POA: Diagnosis not present

## 2021-12-24 DIAGNOSIS — N184 Chronic kidney disease, stage 4 (severe): Secondary | ICD-10-CM | POA: Diagnosis not present

## 2021-12-26 ENCOUNTER — Ambulatory Visit (HOSPITAL_COMMUNITY)
Admission: RE | Admit: 2021-12-26 | Discharge: 2021-12-26 | Disposition: A | Discharge status: Home / Self Care | Payer: Medicare HMO | Source: Ambulatory Visit | Attending: Gerontology | Admitting: Gerontology

## 2021-12-26 ENCOUNTER — Other Ambulatory Visit: Payer: Self-pay

## 2021-12-26 ENCOUNTER — Other Ambulatory Visit (HOSPITAL_COMMUNITY): Payer: Self-pay | Admitting: Gerontology

## 2021-12-26 DIAGNOSIS — R0781 Pleurodynia: Secondary | ICD-10-CM

## 2021-12-26 DIAGNOSIS — M1009 Idiopathic gout, multiple sites: Secondary | ICD-10-CM | POA: Diagnosis not present

## 2021-12-26 DIAGNOSIS — B1920 Unspecified viral hepatitis C without hepatic coma: Secondary | ICD-10-CM | POA: Diagnosis not present

## 2021-12-26 DIAGNOSIS — N184 Chronic kidney disease, stage 4 (severe): Secondary | ICD-10-CM | POA: Diagnosis not present

## 2021-12-26 DIAGNOSIS — I10 Essential (primary) hypertension: Secondary | ICD-10-CM | POA: Diagnosis not present

## 2022-01-07 NOTE — Telephone Encounter (Signed)
Entered in error  1:19 PM, 01/07/22 M. Sherlyn Lees, PT, DPT Physical Therapist- [place] Office Number: [phone]

## 2022-01-08 DIAGNOSIS — M25571 Pain in right ankle and joints of right foot: Secondary | ICD-10-CM | POA: Diagnosis not present

## 2022-01-08 DIAGNOSIS — K5903 Drug induced constipation: Secondary | ICD-10-CM | POA: Diagnosis not present

## 2022-01-08 DIAGNOSIS — M130 Polyarthritis, unspecified: Secondary | ICD-10-CM | POA: Diagnosis not present

## 2022-01-08 DIAGNOSIS — B372 Candidiasis of skin and nail: Secondary | ICD-10-CM | POA: Diagnosis not present

## 2022-01-08 DIAGNOSIS — M545 Low back pain, unspecified: Secondary | ICD-10-CM | POA: Diagnosis not present

## 2022-01-08 DIAGNOSIS — M542 Cervicalgia: Secondary | ICD-10-CM | POA: Diagnosis not present

## 2022-01-08 DIAGNOSIS — I10 Essential (primary) hypertension: Secondary | ICD-10-CM | POA: Diagnosis not present

## 2022-01-08 DIAGNOSIS — Z79891 Long term (current) use of opiate analgesic: Secondary | ICD-10-CM | POA: Diagnosis not present

## 2022-01-08 DIAGNOSIS — M25569 Pain in unspecified knee: Secondary | ICD-10-CM | POA: Diagnosis not present

## 2022-01-21 DIAGNOSIS — M10071 Idiopathic gout, right ankle and foot: Secondary | ICD-10-CM | POA: Diagnosis not present

## 2022-01-22 DIAGNOSIS — Z1331 Encounter for screening for depression: Secondary | ICD-10-CM | POA: Diagnosis not present

## 2022-01-22 DIAGNOSIS — K219 Gastro-esophageal reflux disease without esophagitis: Secondary | ICD-10-CM | POA: Diagnosis not present

## 2022-01-22 DIAGNOSIS — I10 Essential (primary) hypertension: Secondary | ICD-10-CM | POA: Diagnosis not present

## 2022-01-22 DIAGNOSIS — G959 Disease of spinal cord, unspecified: Secondary | ICD-10-CM | POA: Diagnosis not present

## 2022-01-22 DIAGNOSIS — Z23 Encounter for immunization: Secondary | ICD-10-CM | POA: Diagnosis not present

## 2022-01-22 DIAGNOSIS — N184 Chronic kidney disease, stage 4 (severe): Secondary | ICD-10-CM | POA: Diagnosis not present

## 2022-01-22 DIAGNOSIS — Z1389 Encounter for screening for other disorder: Secondary | ICD-10-CM | POA: Diagnosis not present

## 2022-01-22 DIAGNOSIS — Z0001 Encounter for general adult medical examination with abnormal findings: Secondary | ICD-10-CM | POA: Diagnosis not present

## 2022-02-17 DIAGNOSIS — G8250 Quadriplegia, unspecified: Secondary | ICD-10-CM | POA: Diagnosis not present

## 2022-02-17 DIAGNOSIS — M25562 Pain in left knee: Secondary | ICD-10-CM | POA: Diagnosis not present

## 2022-02-17 DIAGNOSIS — M1712 Unilateral primary osteoarthritis, left knee: Secondary | ICD-10-CM | POA: Diagnosis not present

## 2022-02-17 DIAGNOSIS — Z885 Allergy status to narcotic agent status: Secondary | ICD-10-CM | POA: Diagnosis not present

## 2022-02-17 DIAGNOSIS — R6889 Other general symptoms and signs: Secondary | ICD-10-CM | POA: Diagnosis not present

## 2022-02-22 DIAGNOSIS — I10 Essential (primary) hypertension: Secondary | ICD-10-CM | POA: Diagnosis not present

## 2022-02-22 DIAGNOSIS — M159 Polyosteoarthritis, unspecified: Secondary | ICD-10-CM | POA: Diagnosis not present

## 2022-03-10 DIAGNOSIS — Z0100 Encounter for examination of eyes and vision without abnormal findings: Secondary | ICD-10-CM | POA: Diagnosis not present

## 2022-03-10 DIAGNOSIS — H25011 Cortical age-related cataract, right eye: Secondary | ICD-10-CM | POA: Diagnosis not present

## 2022-03-10 DIAGNOSIS — H52201 Unspecified astigmatism, right eye: Secondary | ICD-10-CM | POA: Diagnosis not present

## 2022-03-10 DIAGNOSIS — R6889 Other general symptoms and signs: Secondary | ICD-10-CM | POA: Diagnosis not present

## 2022-03-10 DIAGNOSIS — H2511 Age-related nuclear cataract, right eye: Secondary | ICD-10-CM | POA: Diagnosis not present

## 2022-03-10 DIAGNOSIS — H04123 Dry eye syndrome of bilateral lacrimal glands: Secondary | ICD-10-CM | POA: Diagnosis not present

## 2022-03-10 DIAGNOSIS — H524 Presbyopia: Secondary | ICD-10-CM | POA: Diagnosis not present

## 2022-03-10 DIAGNOSIS — H5201 Hypermetropia, right eye: Secondary | ICD-10-CM | POA: Diagnosis not present

## 2022-03-10 DIAGNOSIS — Z961 Presence of intraocular lens: Secondary | ICD-10-CM | POA: Diagnosis not present

## 2022-03-11 DIAGNOSIS — R6889 Other general symptoms and signs: Secondary | ICD-10-CM | POA: Diagnosis not present

## 2022-03-11 DIAGNOSIS — M130 Polyarthritis, unspecified: Secondary | ICD-10-CM | POA: Diagnosis not present

## 2022-03-11 DIAGNOSIS — Z79891 Long term (current) use of opiate analgesic: Secondary | ICD-10-CM | POA: Diagnosis not present

## 2022-03-11 DIAGNOSIS — M25569 Pain in unspecified knee: Secondary | ICD-10-CM | POA: Diagnosis not present

## 2022-03-11 DIAGNOSIS — I10 Essential (primary) hypertension: Secondary | ICD-10-CM | POA: Diagnosis not present

## 2022-03-11 DIAGNOSIS — M542 Cervicalgia: Secondary | ICD-10-CM | POA: Diagnosis not present

## 2022-03-11 DIAGNOSIS — M545 Low back pain, unspecified: Secondary | ICD-10-CM | POA: Diagnosis not present

## 2022-03-24 DIAGNOSIS — M159 Polyosteoarthritis, unspecified: Secondary | ICD-10-CM | POA: Diagnosis not present

## 2022-03-24 DIAGNOSIS — I10 Essential (primary) hypertension: Secondary | ICD-10-CM | POA: Diagnosis not present

## 2022-04-24 DIAGNOSIS — I10 Essential (primary) hypertension: Secondary | ICD-10-CM | POA: Diagnosis not present

## 2022-04-24 DIAGNOSIS — M159 Polyosteoarthritis, unspecified: Secondary | ICD-10-CM | POA: Diagnosis not present

## 2022-05-15 DIAGNOSIS — R6889 Other general symptoms and signs: Secondary | ICD-10-CM | POA: Diagnosis not present

## 2022-05-24 DIAGNOSIS — I10 Essential (primary) hypertension: Secondary | ICD-10-CM | POA: Diagnosis not present

## 2022-05-24 DIAGNOSIS — M159 Polyosteoarthritis, unspecified: Secondary | ICD-10-CM | POA: Diagnosis not present

## 2022-06-02 DIAGNOSIS — M25569 Pain in unspecified knee: Secondary | ICD-10-CM | POA: Diagnosis not present

## 2022-06-02 DIAGNOSIS — M130 Polyarthritis, unspecified: Secondary | ICD-10-CM | POA: Diagnosis not present

## 2022-06-02 DIAGNOSIS — I10 Essential (primary) hypertension: Secondary | ICD-10-CM | POA: Diagnosis not present

## 2022-06-02 DIAGNOSIS — M542 Cervicalgia: Secondary | ICD-10-CM | POA: Diagnosis not present

## 2022-06-02 DIAGNOSIS — Z79891 Long term (current) use of opiate analgesic: Secondary | ICD-10-CM | POA: Diagnosis not present

## 2022-06-02 DIAGNOSIS — R6889 Other general symptoms and signs: Secondary | ICD-10-CM | POA: Diagnosis not present

## 2022-06-02 DIAGNOSIS — M545 Low back pain, unspecified: Secondary | ICD-10-CM | POA: Diagnosis not present

## 2022-06-03 DIAGNOSIS — R6889 Other general symptoms and signs: Secondary | ICD-10-CM | POA: Diagnosis not present

## 2022-06-28 ENCOUNTER — Encounter: Payer: Self-pay | Admitting: Emergency Medicine

## 2022-06-28 ENCOUNTER — Ambulatory Visit
Admission: EM | Admit: 2022-06-28 | Discharge: 2022-06-28 | Disposition: A | Discharge status: Home / Self Care | Payer: Medicare HMO | Attending: Family Medicine | Admitting: Family Medicine

## 2022-06-28 ENCOUNTER — Other Ambulatory Visit: Payer: Self-pay

## 2022-06-28 DIAGNOSIS — K0510 Chronic gingivitis, plaque induced: Secondary | ICD-10-CM | POA: Diagnosis not present

## 2022-06-28 MED ORDER — AMOXICILLIN-POT CLAVULANATE 875-125 MG PO TABS: 1.0000 | ORAL_TABLET | Freq: Two times a day (BID) | ORAL | 0 refills | Status: DC

## 2022-06-28 MED ORDER — CHLORHEXIDINE GLUCONATE 0.12 % MT SOLN: 15.0000 mL | Freq: Two times a day (BID) | OROMUCOSAL | 0 refills | Status: DC

## 2022-06-28 MED ORDER — LIDOCAINE VISCOUS HCL 2 % MT SOLN: 5.0000 mL | Freq: Four times a day (QID) | OROMUCOSAL | 0 refills | Status: DC | PRN

## 2022-06-28 NOTE — ED Triage Notes (Signed)
Pt reports top and lower gum pain and bleeding x2 months. Pt reports has been unable to get in with dentist. Pt denies being on blood thinner or known injury.

## 2022-07-02 NOTE — ED Provider Notes (Signed)
[place]   [id number] 06/28/22 Arrival Time: 5188  ASSESSMENT & PLAN:  1. Gingivitis    No sign of abscess requiring I&D at this time. Discussed.  Meds ordered this encounter  Medications   amoxicillin-clavulanate (AUGMENTIN) 875-125 MG tablet    Sig: Take 1 tablet by mouth every 12 (twelve) hours.    Dispense:  20 tablet    Refill:  0   chlorhexidine (PERIDEX) 0.12 % solution    Sig: Use as directed 15 mLs in the mouth or throat 2 (two) times daily.    Dispense:  473 mL    Refill:  0   magic mouthwash (lidocaine, diphenhydrAMINE, alum & mag hydroxide) suspension    Sig: Swish and spit 5 mLs 4 (four) times daily as needed for mouth pain.    Dispense:  360 mL    Refill:  0    Follow-up Information     [facility] EMERGENCY DEPARTMENT.   Specialty: Emergency Medicine Why: If symptoms worsen in any way. Contact information: [address] [id number] [place] [phone]               Will need to see dentist.  Reviewed expectations re: course of current medical issues. Questions answered. Outlined signs and symptoms indicating need for more acute intervention. Patient verbalized understanding. After Visit Summary given.   SUBJECTIVE:  Colleen Nunez is a 66 y.o. female who reports upper and lower gum pain; bleeding; grad onset; x 2 mos. Does not see dentist. Normal PO intake without n/v/d. Afebrile. No dentures.   OBJECTIVE: Vitals:   06/28/22 1554  BP: (!) 145/84  Pulse: 94  Resp: 20  Temp: 99 F (37.2 C)  TempSrc: Oral  SpO2: 96%    General appearance: alert; no distress HENT: normocephalic; atraumatic; dentition: poor; upper and lower gums with areas of significant erythema, tenderness, and slight bleeding; mild odor; normal jaw movements Neck: supple without LAD; FROM; trachea midline Lungs: normal respirations; unlabored; speaks full sentences without difficulty Skin: warm and dry Psychological:  alert and cooperative; normal mood and affect  Allergies  Allergen Reactions   Aspirin     REACTION: Hx of overdose on this years ago - too scared to take. Tried to committ suicide.   Oxycodone Nausea And Vomiting    Past Medical History:  Diagnosis Date   Anemia    Anxiety    Arthritis    Chronic hip pain    Chronic knee pain    Depression    h/o suicide attempts in the past   GERD (gastroesophageal reflux disease)    Gout    Hepatitis C antibody test positive        Hypertension    Polysubstance abuse ([place])    h/o   Recurrent falls    Renal insufficiency    Social History   Socioeconomic History   Marital status: Single    Spouse name: Not on file   Number of children: 2   Years of education: Not on file   Highest education level: Not on file  Occupational History   Not on file  Tobacco Use   Smoking status: Passive Smoke Exposure - Never Smoker   Smokeless tobacco: Never  Vaping Use   Vaping Use: Never used  Substance and Sexual Activity   Alcohol use: Yes    Alcohol/week: 3.0 standard drinks of alcohol    Types: 3 Cans of beer per week    Comment: occasionally  Drug use: Not Currently    Comment: h/o crack cocaine in past. Clean since 2006   Sexual activity: Yes    Birth control/protection: Surgical  Other Topics Concern   Not on file  Social History Narrative   Not on file   Social Determinants of Health   Financial Resource Strain: Not on file  Food Insecurity: Not on file  Transportation Needs: Not on file  Physical Activity: Not on file  Stress: Not on file  Social Connections: Not on file  Intimate Partner Violence: Not on file   Family History  Problem Relation Age of Onset   Diabetes Sister    Diabetes Mother        deceased age 67   Kidney failure Mother    Alcohol abuse Father    Colon cancer Neg Hx    Past Surgical History:  Procedure Laterality Date   ABDOMINAL HYSTERECTOMY     ANTERIOR CERVICAL CORPECTOMY N/A 06/09/2013    Procedure: C4 C5 Corpectomy with C6-7 Anterior cervical fusion/Peek cage/Trestle plate;  Surgeon: Otilio Connors, MD;  Location: [facility];  Service: Neurosurgery;  Laterality: N/A;  Cervical Four, Cervical Five Corpectomy and Cervical six-seven Anterior cervical fusion/Peek cage Three-Five /Trestle Plating Cervical Three to Cervical Seven   ANTERIOR CERVICAL DECOMP/DISCECTOMY FUSION N/A 08/12/2013   Procedure: Repair of Anterior  Cervical CSF LEAK, lumbar drain placement.;  Surgeon: Floyce Stakes, MD;  Location: [place];  Service: Neurosurgery;  Laterality: N/A;   bilateral SOO and appendectomy  2006   cartilage removal     left knee   COLONOSCOPY N/A 07/04/2015   Procedure: COLONOSCOPY;  Surgeon: Rogene Houston, MD;  [facility];  Service: Endoscopy;  Laterality: N/A;  100   KNEE ARTHROSCOPY     X-STOP IMPLANTATION        Vanessa Kick, MD 07/02/22 [phone]

## 2022-07-28 ENCOUNTER — Telehealth: Payer: Self-pay | Admitting: Orthopaedic Surgery

## 2022-07-28 NOTE — Telephone Encounter (Signed)
Patient called at 11:44 am and left a message that she is wanting to schedule appt with Dr. Luna Glasgow.    I called the patient and the line is busy with both home and cell phone.

## 2022-07-31 ENCOUNTER — Encounter: Payer: Self-pay | Admitting: Orthopaedic Surgery

## 2022-07-31 ENCOUNTER — Ambulatory Visit (INDEPENDENT_AMBULATORY_CARE_PROVIDER_SITE_OTHER): Payer: Medicare HMO | Admitting: Orthopaedic Surgery

## 2022-07-31 VITALS — Ht 62.0 in | Wt 158.0 lb

## 2022-07-31 DIAGNOSIS — M542 Cervicalgia: Secondary | ICD-10-CM | POA: Diagnosis not present

## 2022-07-31 DIAGNOSIS — N184 Chronic kidney disease, stage 4 (severe): Secondary | ICD-10-CM | POA: Diagnosis not present

## 2022-07-31 DIAGNOSIS — K219 Gastro-esophageal reflux disease without esophagitis: Secondary | ICD-10-CM | POA: Diagnosis not present

## 2022-07-31 DIAGNOSIS — M7062 Trochanteric bursitis, left hip: Secondary | ICD-10-CM

## 2022-07-31 DIAGNOSIS — M109 Gout, unspecified: Secondary | ICD-10-CM | POA: Diagnosis not present

## 2022-07-31 DIAGNOSIS — G959 Disease of spinal cord, unspecified: Secondary | ICD-10-CM | POA: Diagnosis not present

## 2022-07-31 DIAGNOSIS — B351 Tinea unguium: Secondary | ICD-10-CM

## 2022-07-31 DIAGNOSIS — I10 Essential (primary) hypertension: Secondary | ICD-10-CM | POA: Diagnosis not present

## 2022-07-31 MED ORDER — METHYLPREDNISOLONE ACETATE 40 MG/ML IJ SUSP
40.0000 mg | Freq: Once | INTRAMUSCULAR | Status: AC
  Administered 2022-07-31: 40 mg via INTRA_ARTICULAR

## 2022-07-31 NOTE — Addendum Note (Signed)
Addended by: Obie Dredge A on: 07/31/2022 02:13 PM   Modules accepted: Orders

## 2022-07-31 NOTE — Progress Notes (Signed)
PROCEDURE NOTE:  The patient request injection, verbal consent was obtained.  The left trochanteric area of the hip was prepped appropriately after time out was performed.   Sterile technique was observed and injection of 1 cc of DepoMedrol 40 mg with several cc's of plain xylocaine. Anesthesia was provided by ethyl chloride and a 20-gauge needle was used to inject the hip area. The injection was tolerated well.  A band aid dressing was applied.  The patient was advised to apply ice later today and tomorrow to the injection sight as needed.  I pared her toe nails.  Encounter Diagnoses  Name Primary?   Trochanteric bursitis of left hip Yes   Toenail fungus    Return in two months.  Call if any problem.  Precautions discussed.  Electronically Signed Sanjuana Kava, MD 9/14/20239:28 AM

## 2022-08-29 DIAGNOSIS — Z6829 Body mass index (BMI) 29.0-29.9, adult: Secondary | ICD-10-CM | POA: Diagnosis not present

## 2022-08-29 DIAGNOSIS — Z202 Contact with and (suspected) exposure to infections with a predominantly sexual mode of transmission: Secondary | ICD-10-CM | POA: Diagnosis not present

## 2022-08-29 DIAGNOSIS — N760 Acute vaginitis: Secondary | ICD-10-CM | POA: Diagnosis not present

## 2022-08-29 DIAGNOSIS — E663 Overweight: Secondary | ICD-10-CM | POA: Diagnosis not present

## 2022-08-29 DIAGNOSIS — M069 Rheumatoid arthritis, unspecified: Secondary | ICD-10-CM | POA: Diagnosis not present

## 2022-08-29 DIAGNOSIS — N390 Urinary tract infection, site not specified: Secondary | ICD-10-CM | POA: Diagnosis not present

## 2022-08-29 DIAGNOSIS — E039 Hypothyroidism, unspecified: Secondary | ICD-10-CM | POA: Diagnosis not present

## 2022-08-29 DIAGNOSIS — I10 Essential (primary) hypertension: Secondary | ICD-10-CM | POA: Diagnosis not present

## 2022-08-30 DIAGNOSIS — N184 Chronic kidney disease, stage 4 (severe): Secondary | ICD-10-CM | POA: Diagnosis not present

## 2022-08-30 DIAGNOSIS — I10 Essential (primary) hypertension: Secondary | ICD-10-CM | POA: Diagnosis not present

## 2022-09-10 DIAGNOSIS — M542 Cervicalgia: Secondary | ICD-10-CM | POA: Diagnosis not present

## 2022-09-10 DIAGNOSIS — R6889 Other general symptoms and signs: Secondary | ICD-10-CM | POA: Diagnosis not present

## 2022-09-10 DIAGNOSIS — I10 Essential (primary) hypertension: Secondary | ICD-10-CM | POA: Diagnosis not present

## 2022-09-10 DIAGNOSIS — M25569 Pain in unspecified knee: Secondary | ICD-10-CM | POA: Diagnosis not present

## 2022-09-10 DIAGNOSIS — M545 Low back pain, unspecified: Secondary | ICD-10-CM | POA: Diagnosis not present

## 2022-09-10 DIAGNOSIS — Z79891 Long term (current) use of opiate analgesic: Secondary | ICD-10-CM | POA: Diagnosis not present

## 2022-09-30 ENCOUNTER — Ambulatory Visit: Payer: Medicare HMO | Admitting: Orthopaedic Surgery

## 2022-09-30 DIAGNOSIS — K219 Gastro-esophageal reflux disease without esophagitis: Secondary | ICD-10-CM | POA: Diagnosis not present

## 2022-09-30 DIAGNOSIS — I10 Essential (primary) hypertension: Secondary | ICD-10-CM | POA: Diagnosis not present

## 2022-10-27 DIAGNOSIS — M542 Cervicalgia: Secondary | ICD-10-CM | POA: Diagnosis not present

## 2022-10-27 DIAGNOSIS — R6889 Other general symptoms and signs: Secondary | ICD-10-CM | POA: Diagnosis not present

## 2022-10-27 DIAGNOSIS — I10 Essential (primary) hypertension: Secondary | ICD-10-CM | POA: Diagnosis not present

## 2022-10-27 DIAGNOSIS — M545 Low back pain, unspecified: Secondary | ICD-10-CM | POA: Diagnosis not present

## 2022-10-27 DIAGNOSIS — M25569 Pain in unspecified knee: Secondary | ICD-10-CM | POA: Diagnosis not present

## 2022-10-27 DIAGNOSIS — Z79891 Long term (current) use of opiate analgesic: Secondary | ICD-10-CM | POA: Diagnosis not present

## 2022-10-30 DIAGNOSIS — K219 Gastro-esophageal reflux disease without esophagitis: Secondary | ICD-10-CM | POA: Diagnosis not present

## 2022-10-30 DIAGNOSIS — I10 Essential (primary) hypertension: Secondary | ICD-10-CM | POA: Diagnosis not present

## 2022-11-03 DIAGNOSIS — R6889 Other general symptoms and signs: Secondary | ICD-10-CM | POA: Diagnosis not present

## 2022-11-04 ENCOUNTER — Telehealth: Payer: Self-pay | Admitting: Radiology

## 2022-11-04 ENCOUNTER — Telehealth: Payer: Self-pay | Admitting: Orthopaedic Surgery

## 2022-11-04 ENCOUNTER — Encounter: Payer: Medicare HMO | Admitting: Orthopaedic Surgery

## 2022-11-04 NOTE — Telephone Encounter (Signed)
Patient called, LMVM today saying she got here this AM for her appt and no one was here.  She was in pain and could not wait.  Please call her to discuss/reschedule.

## 2022-11-04 NOTE — Telephone Encounter (Signed)
Returned the patient's call to [phone] per her request to reschedule her missed appointment yesterday.  LVM for her to call us back.

## 2023-01-15 ENCOUNTER — Ambulatory Visit: Payer: Self-pay

## 2023-01-15 NOTE — Telephone Encounter (Signed)
Chief Complaint: Chest pain off and on, needing refill of hydrocodone Symptoms: pain to right arm Frequency: 2 weeks ago it started off and on chest pain lasting almost 2 hours  Pertinent Negatives: Patient denies other symptoms Disposition: '[]'$ ED /'[x]'$ Urgent Care (no appt availability in office) / '[]'$ Appointment(In office/virtual)/ '[]'$  [facility]/ '[]'$ Home Care/ '[]'$ Refused Recommended Disposition /'[]'$ [facility]/ '[x]'$  Follow-up with PCP Additional Notes: Patient says she's been having off and on chest pain in the center of the chest, no pain at present time. She says she is wanting a refill of the hydrocodone. Advised to follow up with PCP. She says she had some words with him due to he wants her to see a kidney doctor. I advised if she's needing a refill to go to her doctor or UC for the right arm pain, ED for chest pain if it comes back. She verbalized understanding.  Reason for Disposition  [1] Chest pain lasts < 5 minutes AND [2] NO chest pain or cardiac symptoms (e.g., breathing difficulty, sweating) now  (Exception: Chest pains that last only a few seconds.)  Answer Assessment - Initial Assessment Questions 1. LOCATION: "Where does it hurt?"       Middle of chest 2. RADIATION: "Does the pain go anywhere else?" (e.g., into neck, jaw, arms, back)     Right arm 3. ONSET: "When did the chest pain begin?" (Minutes, hours or days)      2 weeks ago 4. PATTERN: "Does the pain come and go, or has it been constant since it started?"  "Does it get worse with exertion?"      Come and go 5. DURATION: "How long does it last" (e.g., seconds, minutes, hours)     Close to 2 hours 6. SEVERITY: "How bad is the pain?"  (e.g., Scale 1-10; mild, moderate, or severe)    - MILD (1-3): doesn't interfere with normal activities     - MODERATE (4-7): interferes with normal activities or awakens from sleep    - SEVERE (8-10): excruciating pain, unable to do any normal activities       Not hurting  at this moment 7. CARDIAC RISK FACTORS: "Do you have any history of heart problems or risk factors for heart disease?" (e.g., angina, prior heart attack; diabetes, high blood pressure, high cholesterol, smoker, or strong family history of heart disease)     I don't know 8. PULMONARY RISK FACTORS: "Do you have any history of lung disease?"  (e.g., blood clots in lung, asthma, emphysema, birth control pills)     I don't know 9. CAUSE: "What do you think is causing the chest pain?"     Under a lot of stress, had to go to court, had to move out of home, death in family 29. OTHER SYMPTOMS: "Do you have any other symptoms?" (e.g., dizziness, nausea, vomiting, sweating, fever, difficulty breathing, cough)       No  Protocols used: Chest Pain-A-AH

## 2023-02-03 IMAGING — MG MM DIGITAL SCREENING BILAT W/ TOMO AND CAD
6 of 12 series · 6 of 36 positions shown · non-contrast
Comparison: Previous exam(s).

CLINICAL DATA: Screening.

EXAM:
DIGITAL SCREENING BILATERAL MAMMOGRAM WITH TOMOSYNTHESIS AND CAD
TECHNIQUE: Bilateral screening digital craniocaudal and mediolateral oblique
mammograms were obtained. Bilateral screening digital breast
tomosynthesis was performed. The images were evaluated with
computer-aided detection.

[L CC synth-2D (1 of 2)]
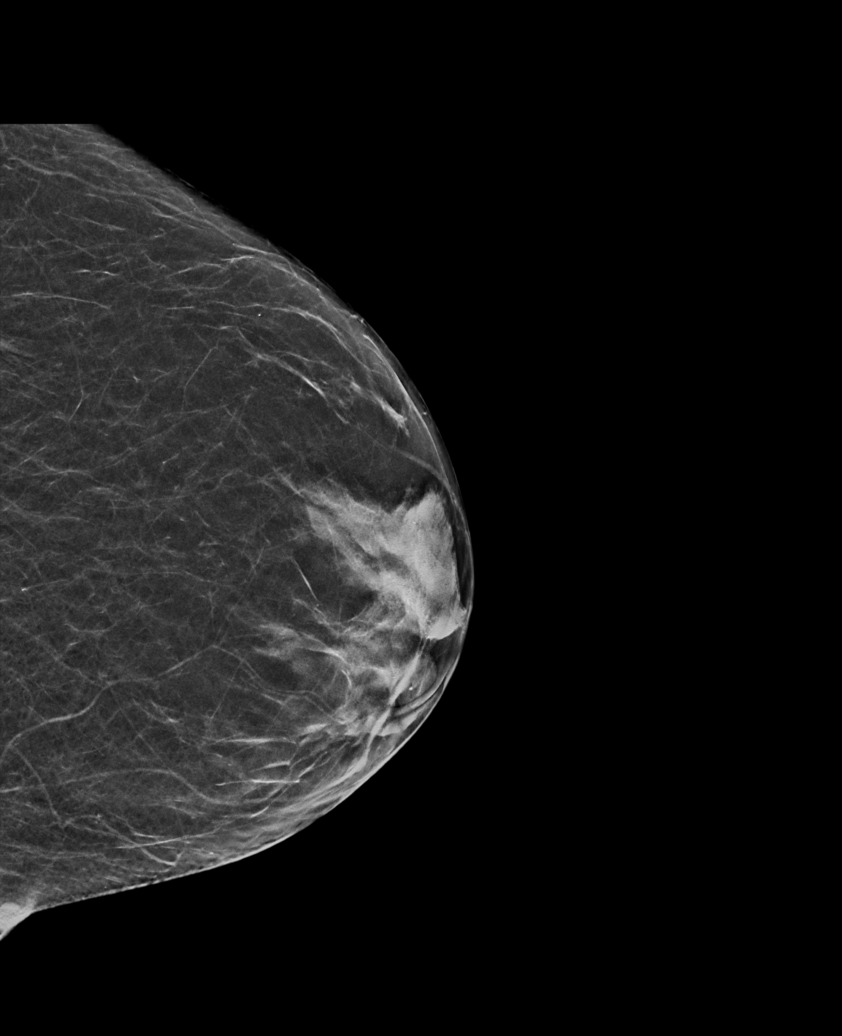

[R CC synth-2D (1 of 2)]
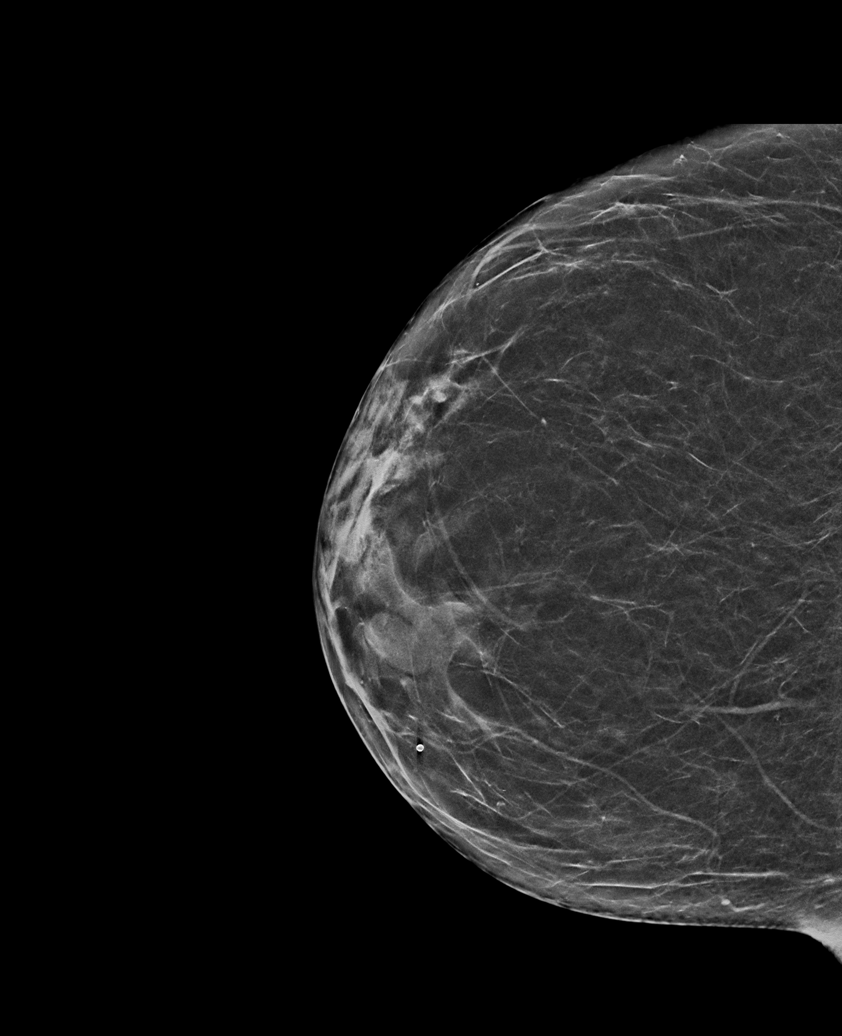

[R MLO synth-2D]
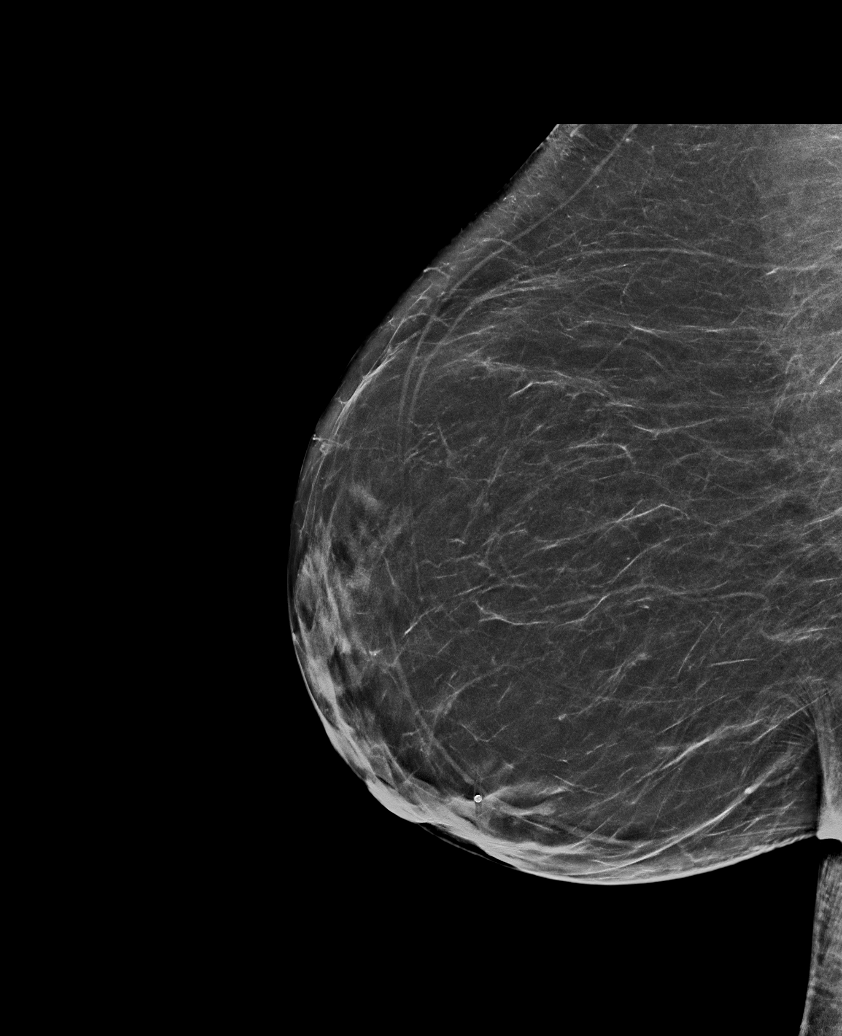

[L CC synth-2D (2 of 2)]
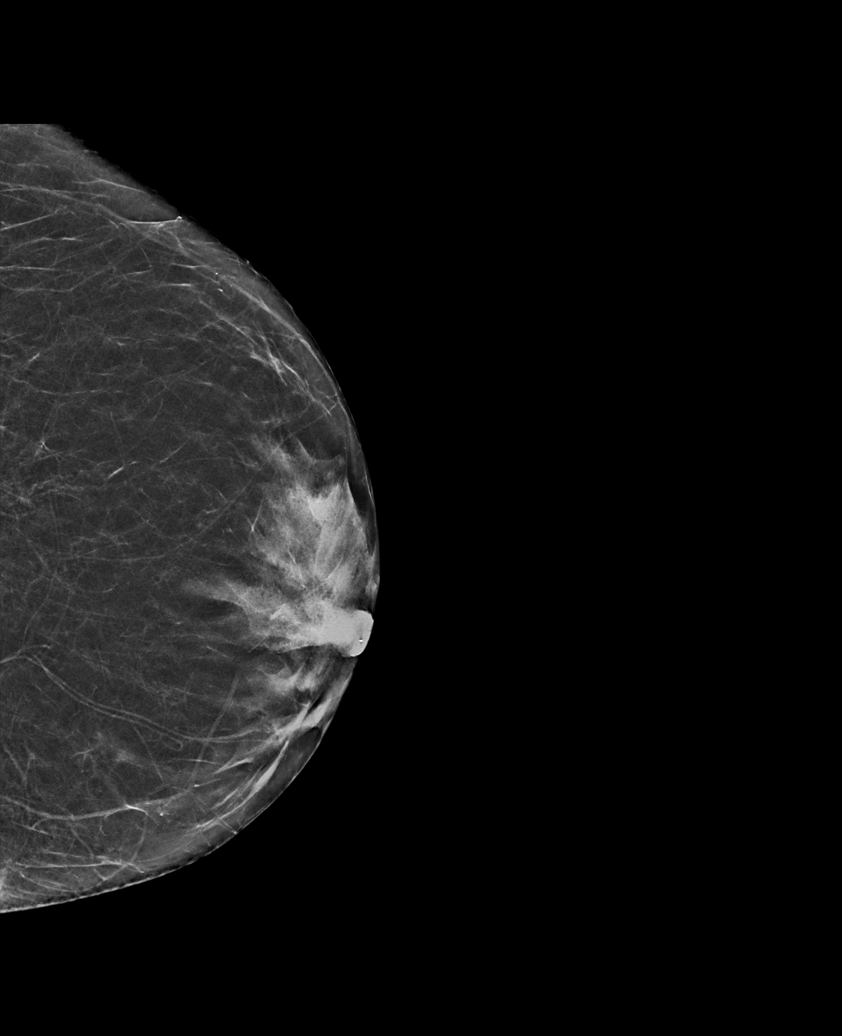

[L MLO synth-2D]
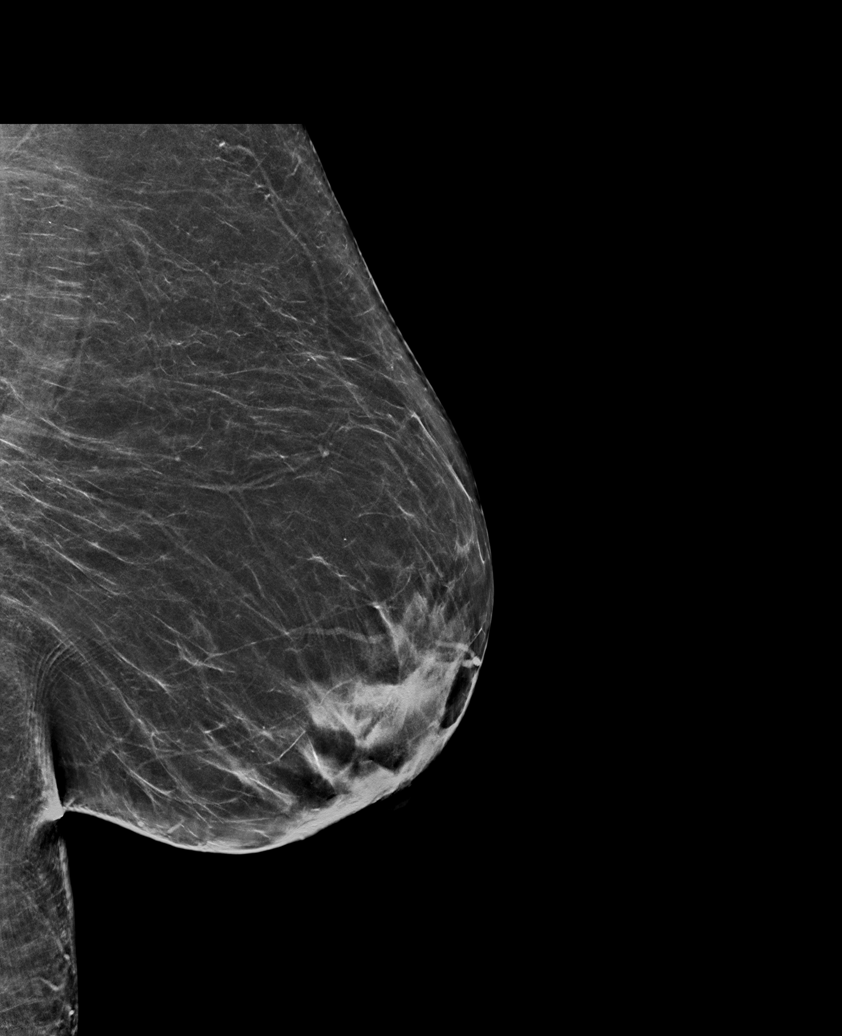

[R CC synth-2D (2 of 2)]
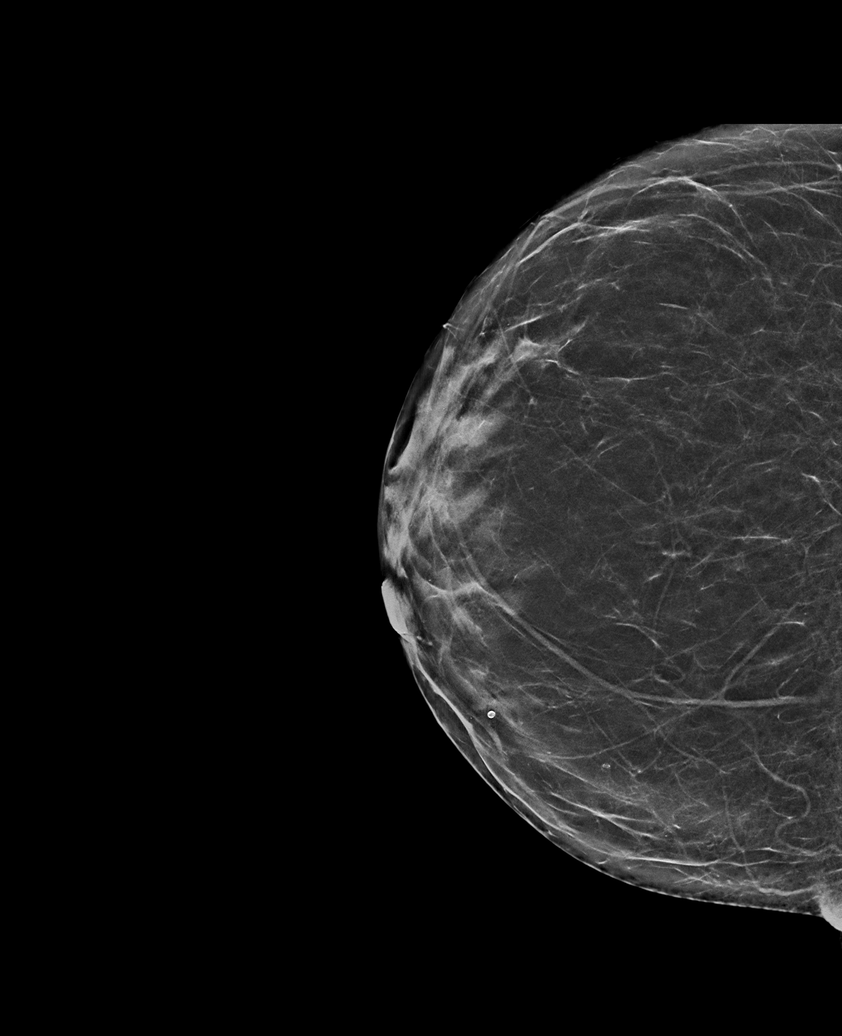

[6 of 36 positions shown; findings below may reference images not displayed]

ACR Breast Density Category b: There are scattered areas of
fibroglandular density.
FINDINGS: There are no findings suspicious for malignancy.
IMPRESSION: No mammographic evidence of malignancy. A result letter of this
screening mammogram will be mailed directly to the patient.

RECOMMENDATION:
Screening mammogram in one year. (Code:51-O-LD2)

BI-RADS CATEGORY  1: Negative.

## 2023-02-19 DIAGNOSIS — I10 Essential (primary) hypertension: Secondary | ICD-10-CM | POA: Diagnosis not present

## 2023-02-19 DIAGNOSIS — K219 Gastro-esophageal reflux disease without esophagitis: Secondary | ICD-10-CM | POA: Diagnosis not present

## 2023-03-10 ENCOUNTER — Ambulatory Visit (INDEPENDENT_AMBULATORY_CARE_PROVIDER_SITE_OTHER): Payer: No Typology Code available for payment source | Admitting: Orthopaedic Surgery

## 2023-03-10 ENCOUNTER — Encounter: Payer: Self-pay | Admitting: Orthopaedic Surgery

## 2023-03-10 DIAGNOSIS — M7062 Trochanteric bursitis, left hip: Secondary | ICD-10-CM | POA: Diagnosis not present

## 2023-03-10 DIAGNOSIS — B351 Tinea unguium: Secondary | ICD-10-CM | POA: Diagnosis not present

## 2023-03-10 MED ORDER — HYDROCODONE-ACETAMINOPHEN 5-325 MG PO TABS: ORAL_TABLET | ORAL | 0 refills | Status: DC

## 2023-03-10 MED ORDER — METHYLPREDNISOLONE ACETATE 40 MG/ML IJ SUSP
40.0000 mg | Freq: Once | INTRAMUSCULAR | Status: AC
  Administered 2023-03-10: 40 mg via INTRA_ARTICULAR

## 2023-03-10 NOTE — Progress Notes (Signed)
PROCEDURE NOTE:  The patient request injection, verbal consent was obtained.  The left trochanteric area of the hip was prepped appropriately after time out was performed.   Sterile technique was observed and injection of 1 cc of DepoMedrol 40 mg with several cc's of plain xylocaine. Anesthesia was provided by ethyl chloride and a 20-gauge needle was used to inject the hip area. The injection was tolerated well.  A band aid dressing was applied.  The patient was advised to apply ice later today and tomorrow to the injection sight as needed.  Encounter Diagnoses  Name Primary?   Trochanteric bursitis of left hip Yes   Toenail fungus    I also pared her nails of the feet today.  I have reviewed the [place] Controlled Substance Reporting System web site prior to prescribing narcotic medicine for this patient.  Call if any problem.  Precautions discussed.  Return prn.  Call if any problem.  Precautions discussed.

## 2023-03-25 ENCOUNTER — Other Ambulatory Visit (HOSPITAL_COMMUNITY): Payer: Self-pay | Admitting: Gerontology

## 2023-03-25 ENCOUNTER — Other Ambulatory Visit (HOSPITAL_COMMUNITY): Payer: Self-pay | Admitting: Internal Medicine

## 2023-03-25 DIAGNOSIS — N184 Chronic kidney disease, stage 4 (severe): Secondary | ICD-10-CM | POA: Diagnosis not present

## 2023-03-25 DIAGNOSIS — Z1331 Encounter for screening for depression: Secondary | ICD-10-CM | POA: Diagnosis not present

## 2023-03-25 DIAGNOSIS — Z1231 Encounter for screening mammogram for malignant neoplasm of breast: Secondary | ICD-10-CM

## 2023-03-25 DIAGNOSIS — K219 Gastro-esophageal reflux disease without esophagitis: Secondary | ICD-10-CM | POA: Diagnosis not present

## 2023-03-25 DIAGNOSIS — Z13820 Encounter for screening for osteoporosis: Secondary | ICD-10-CM

## 2023-03-25 DIAGNOSIS — Z1389 Encounter for screening for other disorder: Secondary | ICD-10-CM | POA: Diagnosis not present

## 2023-03-25 DIAGNOSIS — R6889 Other general symptoms and signs: Secondary | ICD-10-CM | POA: Diagnosis not present

## 2023-03-25 DIAGNOSIS — Z0001 Encounter for general adult medical examination with abnormal findings: Secondary | ICD-10-CM | POA: Diagnosis not present

## 2023-03-25 DIAGNOSIS — G959 Disease of spinal cord, unspecified: Secondary | ICD-10-CM | POA: Diagnosis not present

## 2023-03-25 DIAGNOSIS — M109 Gout, unspecified: Secondary | ICD-10-CM | POA: Diagnosis not present

## 2023-03-25 DIAGNOSIS — I10 Essential (primary) hypertension: Secondary | ICD-10-CM | POA: Diagnosis not present

## 2023-03-25 DIAGNOSIS — E039 Hypothyroidism, unspecified: Secondary | ICD-10-CM | POA: Diagnosis not present

## 2023-04-02 ENCOUNTER — Ambulatory Visit (HOSPITAL_COMMUNITY): Payer: No Typology Code available for payment source

## 2023-04-02 DIAGNOSIS — M25561 Pain in right knee: Secondary | ICD-10-CM | POA: Diagnosis not present

## 2023-04-02 DIAGNOSIS — G8250 Quadriplegia, unspecified: Secondary | ICD-10-CM | POA: Diagnosis not present

## 2023-04-02 DIAGNOSIS — M25461 Effusion, right knee: Secondary | ICD-10-CM | POA: Diagnosis not present

## 2023-04-02 DIAGNOSIS — Z96651 Presence of right artificial knee joint: Secondary | ICD-10-CM | POA: Diagnosis not present

## 2023-04-02 DIAGNOSIS — M8589 Other specified disorders of bone density and structure, multiple sites: Secondary | ICD-10-CM | POA: Diagnosis not present

## 2023-04-02 DIAGNOSIS — S8001XA Contusion of right knee, initial encounter: Secondary | ICD-10-CM | POA: Diagnosis not present

## 2023-04-02 DIAGNOSIS — R6889 Other general symptoms and signs: Secondary | ICD-10-CM | POA: Diagnosis not present

## 2023-04-03 ENCOUNTER — Other Ambulatory Visit (HOSPITAL_COMMUNITY): Payer: Self-pay | Admitting: Internal Medicine

## 2023-04-03 ENCOUNTER — Ambulatory Visit (HOSPITAL_COMMUNITY)
Admission: RE | Admit: 2023-04-03 | Discharge: 2023-04-03 | Disposition: A | Discharge status: Home / Self Care | Payer: Medicare HMO | Source: Ambulatory Visit | Attending: Internal Medicine | Admitting: Internal Medicine

## 2023-04-03 ENCOUNTER — Other Ambulatory Visit (HOSPITAL_COMMUNITY)
Admission: RE | Admit: 2023-04-03 | Discharge: 2023-04-03 | Disposition: A | Discharge status: Home / Self Care | Payer: Medicare HMO | Source: Ambulatory Visit | Attending: Internal Medicine | Admitting: Internal Medicine

## 2023-04-03 ENCOUNTER — Other Ambulatory Visit (HOSPITAL_COMMUNITY): Payer: Self-pay | Admitting: Family Medicine

## 2023-04-03 DIAGNOSIS — Z13820 Encounter for screening for osteoporosis: Secondary | ICD-10-CM | POA: Insufficient documentation

## 2023-04-03 DIAGNOSIS — R6889 Other general symptoms and signs: Secondary | ICD-10-CM | POA: Diagnosis not present

## 2023-04-03 DIAGNOSIS — R92323 Mammographic fibroglandular density, bilateral breasts: Secondary | ICD-10-CM | POA: Diagnosis not present

## 2023-04-03 DIAGNOSIS — Z1231 Encounter for screening mammogram for malignant neoplasm of breast: Secondary | ICD-10-CM | POA: Diagnosis not present

## 2023-04-03 DIAGNOSIS — I129 Hypertensive chronic kidney disease with stage 1 through stage 4 chronic kidney disease, or unspecified chronic kidney disease: Secondary | ICD-10-CM | POA: Diagnosis not present

## 2023-04-03 DIAGNOSIS — Z0001 Encounter for general adult medical examination with abnormal findings: Secondary | ICD-10-CM | POA: Insufficient documentation

## 2023-04-03 DIAGNOSIS — R9230 Dense breasts, unspecified: Secondary | ICD-10-CM | POA: Diagnosis not present

## 2023-04-03 DIAGNOSIS — E039 Hypothyroidism, unspecified: Secondary | ICD-10-CM | POA: Diagnosis not present

## 2023-04-03 DIAGNOSIS — N184 Chronic kidney disease, stage 4 (severe): Secondary | ICD-10-CM | POA: Diagnosis not present

## 2023-04-03 LAB — BASIC METABOLIC PANEL
Anion gap: 9 (ref 5–15)
BUN: 44 mg/dL — ABNORMAL HIGH (ref 8–23)
CO2: 23 mmol/L (ref 22–32)
Calcium: 8.9 mg/dL (ref 8.9–10.3)
Chloride: 105 mmol/L (ref 98–111)
Creatinine, Ser: 2.32 mg/dL — ABNORMAL HIGH (ref 0.44–1.00)
GFR, Estimated: 22 mL/min — ABNORMAL LOW (ref >60)
Glucose, Bld: 94 mg/dL (ref 70–99)
Potassium: 4.1 mmol/L (ref 3.5–5.1)
Sodium: 137 mmol/L (ref 135–145)

## 2023-04-03 LAB — LIPID PANEL
Cholesterol: 147 mg/dL (ref 0–200)
HDL: 71 mg/dL (ref >40)
LDL Cholesterol: 67 mg/dL (ref 0–99)
Total CHOL/HDL Ratio: 2.1 RATIO
Triglycerides: 46 mg/dL (ref <150)
VLDL: 9 mg/dL (ref 0–40)

## 2023-04-03 LAB — CBC WITH DIFFERENTIAL/PLATELET
Abs Immature Granulocytes: 0.01 10*3/uL (ref 0.00–0.07)
Basophils Absolute: 0 10*3/uL (ref 0.0–0.1)
Basophils Relative: 0 %
Eosinophils Absolute: 0.2 10*3/uL (ref 0.0–0.5)
Eosinophils Relative: 3 %
HCT: 29.3 % — ABNORMAL LOW (ref 36.0–46.0)
Hemoglobin: 9.2 g/dL — ABNORMAL LOW (ref 12.0–15.0)
Immature Granulocytes: 0 %
Lymphocytes Relative: 41 %
Lymphs Abs: 2.3 10*3/uL (ref 0.7–4.0)
MCH: 28.1 pg (ref 26.0–34.0)
MCHC: 31.4 g/dL (ref 30.0–36.0)
MCV: 89.6 fL (ref 80.0–100.0)
Monocytes Absolute: 0.4 10*3/uL (ref 0.1–1.0)
Monocytes Relative: 7 %
Neutro Abs: 2.8 10*3/uL (ref 1.7–7.7)
Neutrophils Relative %: 49 %
Platelets: 157 10*3/uL (ref 150–400)
RBC: 3.27 MIL/uL — ABNORMAL LOW (ref 3.87–5.11)
RDW: 14.6 % (ref 11.5–15.5)
WBC: 5.6 10*3/uL (ref 4.0–10.5)
nRBC: 0 % (ref 0.0–0.2)

## 2023-04-03 LAB — T4, FREE: Free T4: 0.87 ng/dL (ref 0.61–1.12)

## 2023-04-03 LAB — HEPATIC FUNCTION PANEL
ALT: 10 U/L (ref 0–44)
AST: 16 U/L (ref 15–41)
Albumin: 3.5 g/dL (ref 3.5–5.0)
Alkaline Phosphatase: 79 U/L (ref 38–126)
Bilirubin, Direct: 0.1 mg/dL (ref 0.0–0.2)
Indirect Bilirubin: 0.5 mg/dL (ref 0.3–0.9)
Total Bilirubin: 0.6 mg/dL (ref 0.3–1.2)
Total Protein: 7 g/dL (ref 6.5–8.1)

## 2023-04-03 LAB — TSH: TSH: 4.193 u[IU]/mL (ref 0.350–4.500)

## 2023-04-14 ENCOUNTER — Encounter: Payer: Self-pay | Admitting: Gastroenterology

## 2023-04-16 DIAGNOSIS — R6889 Other general symptoms and signs: Secondary | ICD-10-CM | POA: Diagnosis not present

## 2023-04-25 DIAGNOSIS — I10 Essential (primary) hypertension: Secondary | ICD-10-CM | POA: Diagnosis not present

## 2023-04-25 DIAGNOSIS — K219 Gastro-esophageal reflux disease without esophagitis: Secondary | ICD-10-CM | POA: Diagnosis not present

## 2023-04-30 DIAGNOSIS — K219 Gastro-esophageal reflux disease without esophagitis: Secondary | ICD-10-CM | POA: Diagnosis not present

## 2023-04-30 DIAGNOSIS — I129 Hypertensive chronic kidney disease with stage 1 through stage 4 chronic kidney disease, or unspecified chronic kidney disease: Secondary | ICD-10-CM | POA: Diagnosis not present

## 2023-04-30 DIAGNOSIS — Z79891 Long term (current) use of opiate analgesic: Secondary | ICD-10-CM | POA: Diagnosis not present

## 2023-04-30 DIAGNOSIS — N184 Chronic kidney disease, stage 4 (severe): Secondary | ICD-10-CM | POA: Diagnosis not present

## 2023-04-30 DIAGNOSIS — Z79899 Other long term (current) drug therapy: Secondary | ICD-10-CM | POA: Diagnosis not present

## 2023-04-30 DIAGNOSIS — G959 Disease of spinal cord, unspecified: Secondary | ICD-10-CM | POA: Diagnosis not present

## 2023-04-30 DIAGNOSIS — R6889 Other general symptoms and signs: Secondary | ICD-10-CM | POA: Diagnosis not present

## 2023-04-30 DIAGNOSIS — M109 Gout, unspecified: Secondary | ICD-10-CM | POA: Diagnosis not present

## 2023-04-30 DIAGNOSIS — M542 Cervicalgia: Secondary | ICD-10-CM | POA: Diagnosis not present

## 2023-04-30 DIAGNOSIS — G894 Chronic pain syndrome: Secondary | ICD-10-CM | POA: Diagnosis not present

## 2023-05-11 ENCOUNTER — Ambulatory Visit: Payer: Medicare HMO | Admitting: Gastroenterology

## 2023-05-11 ENCOUNTER — Encounter: Payer: Self-pay | Admitting: Gastroenterology

## 2023-05-11 DIAGNOSIS — Z1231 Encounter for screening mammogram for malignant neoplasm of breast: Secondary | ICD-10-CM | POA: Diagnosis not present

## 2023-05-11 NOTE — Progress Notes (Deleted)
GI Office Note    Referring Provider: Benetta Spar* Primary Care Physician:  Benetta Spar, MD  Primary Gastroenterologist: formerly Dr. Karilyn Cota  Chief Complaint   No chief complaint on file.    History of Present Illness   Colleen Nunez is a 67 y.o. female presenting today at the request of Dr. Felecia Shelling for GERD. She has h/o HCV s/p treatment in 2016, F0/F1 on elastography at that time.   Colonoscopy 2016 with Dr. Karilyn Cota: -small cecal polyps removed, benign -few scattered diverticula -next colonoscopy in 10 years      Medications   Current Outpatient Medications  Medication Sig Dispense Refill   acetaminophen (TYLENOL) 500 MG tablet Take 500 mg by mouth every 6 (six) hours as needed for mild pain or moderate pain.     allopurinol (ZYLOPRIM) 100 MG tablet Take 100 mg by mouth daily.     amLODipine (NORVASC) 10 MG tablet Take 0.5 tablets (5 mg total) by mouth daily. (Patient taking differently: Take 10 mg by mouth daily.)     amoxicillin-clavulanate (AUGMENTIN) 875-125 MG tablet Take 1 tablet by mouth every 12 (twelve) hours. 20 tablet 0   aspirin 325 MG EC tablet Take 325 mg by mouth daily.     calcium-vitamin D (OSCAL WITH D) 500-200 MG-UNIT tablet Take 1 tablet by mouth.     chlorhexidine (PERIDEX) 0.12 % solution Use as directed 15 mLs in the mouth or throat 2 (two) times daily. 473 mL 0   Cholecalciferol (VITAMIN D3) 50 MCG (2000 UT) capsule Take by mouth.     DULoxetine (CYMBALTA) 20 MG capsule Take 20 mg by mouth daily.     gabapentin (NEURONTIN) 300 MG capsule Take 300 mg by mouth 2 (two) times daily as needed (for pain).     HYDROcodone-acetaminophen (NORCO/VICODIN) 5-325 MG tablet Take 1 tablet by mouth 2 (two) times daily as needed for moderate pain. 30 tablet 0   HYDROcodone-acetaminophen (NORCO/VICODIN) 5-325 MG tablet One tablet every six hours for pain.  Limit 7 days. 28 tablet 0   hydrOXYzine (ATARAX/VISTARIL) 25 MG tablet Take 25 mg  by mouth 3 (three) times daily as needed.     hydrOXYzine (VISTARIL) 25 MG capsule Take by mouth.     iron polysaccharides (NIFEREX) 150 MG capsule Take 150 mg by mouth daily.     levothyroxine (SYNTHROID) 50 MCG tablet Take 50 mcg by mouth every morning.     lidocaine (LIDODERM) 5 %      lisinopril (ZESTRIL) 5 MG tablet lisinopril 5 mg tablet  Take 1 tablet every day by oral route.     magic mouthwash (lidocaine, diphenhydrAMINE, alum & mag hydroxide) suspension Swish and spit 5 mLs 4 (four) times daily as needed for mouth pain. 360 mL 0   methocarbamol (ROBAXIN) 500 MG tablet Take 1 tablet (500 mg total) by mouth 2 (two) times daily. 20 tablet 0   MITIGARE 0.6 MG CAPS Take 1 capsule by mouth 2 (two) times daily.     naloxegol oxalate (MOVANTIK) 25 MG TABS tablet Take by mouth daily.     naproxen (NAPROSYN) 500 MG tablet Take 1 tablet (500 mg total) by mouth 2 (two) times daily. 20 tablet 0   nystatin (MYCOSTATIN/NYSTOP) powder APPLY TO AFFECTED AREA 2UTIMES A DAY.     potassium chloride (K-DUR) 10 MEQ tablet Take 10 mEq by mouth daily.     predniSONE (DELTASONE) 20 MG tablet Take 20 mg by mouth daily.  RELISTOR 150 MG TABS Take 3 tablets by mouth every morning.     senna (SENOKOT) 8.6 MG tablet Take 1 tablet by mouth daily.     traMADol (ULTRAM) 50 MG tablet Take 1 tablet (50 mg total) by mouth every 6 (six) hours as needed. 10 tablet 0   No current facility-administered medications for this visit.    Allergies   Allergies as of 05/11/2023 - Review Complete 03/10/2023  Allergen Reaction Noted   Aspirin  12/03/2007   Oxycodone Nausea And Vomiting 06/30/2015    Past Medical History   Past Medical History:  Diagnosis Date   Anemia    Anxiety    Arthritis    Chronic hip pain    Chronic knee pain    Depression    h/o suicide attempts in the past   GERD (gastroesophageal reflux disease)    Gout    Hepatitis C antibody test positive        Hypertension    Polysubstance  abuse (HCC)    h/o   Recurrent falls    Renal insufficiency     Past Surgical History   Past Surgical History:  Procedure Laterality Date   ABDOMINAL HYSTERECTOMY     ANTERIOR CERVICAL CORPECTOMY N/A 06/09/2013   Procedure: C4 C5 Corpectomy with C6-7 Anterior cervical fusion/Peek cage/Trestle plate;  Surgeon: Clydene Fake, MD;  Location: [facility];  Service: Neurosurgery;  Laterality: N/A;  Cervical Four, Cervical Five Corpectomy and Cervical six-seven Anterior cervical fusion/Peek cage Three-Five /Trestle Plating Cervical Three to Cervical Seven   ANTERIOR CERVICAL DECOMP/DISCECTOMY FUSION N/A 08/12/2013   Procedure: Repair of Anterior  Cervical CSF LEAK, lumbar drain placement.;  Surgeon: Karn Cassis, MD;  Location: [facility] OR;  Service: Neurosurgery;  Laterality: N/A;   bilateral SOO and appendectomy  2006   cartilage removal     left knee   COLONOSCOPY N/A 07/04/2015   Procedure: COLONOSCOPY;  Surgeon: Malissa Hippo, MD;  Location: [facility];  Service: Endoscopy;  Laterality: N/A;  100   KNEE ARTHROSCOPY     X-STOP IMPLANTATION      Past Family History   Family History  Problem Relation Age of Onset   Diabetes Sister    Diabetes Mother        deceased age 9   Kidney failure Mother    Alcohol abuse Father    Colon cancer Neg Hx     Past Social History   Social History   Socioeconomic History   Marital status: Single    Spouse name: Not on file   Number of children: 2   Years of education: Not on file   Highest education level: Not on file  Occupational History   Not on file  Tobacco Use   Smoking status: Never    Passive exposure: Yes   Smokeless tobacco: Never  Vaping Use   Vaping Use: Never used  Substance and Sexual Activity   Alcohol use: Yes    Alcohol/week: 3.0 standard drinks of alcohol    Types: 3 Cans of beer per week    Comment: occasionally   Drug use: Not Currently    Comment: h/o crack cocaine in past. Clean since 2006   Sexual  activity: Yes    Birth control/protection: Surgical  Other Topics Concern   Not on file  Social History Narrative   Not on file   Social Determinants of Health   Financial Resource Strain: Not on file  Food Insecurity:  Not on file  Transportation Needs: Not on file  Physical Activity: Not on file  Stress: Not on file  Social Connections: Not on file  Intimate Partner Violence: Not on file    Review of Systems   General: Negative for anorexia, weight loss, fever, chills, fatigue, weakness. Eyes: Negative for vision changes.  ENT: Negative for hoarseness, difficulty swallowing , nasal congestion. CV: Negative for chest pain, angina, palpitations, dyspnea on exertion, peripheral edema.  Respiratory: Negative for dyspnea at rest, dyspnea on exertion, cough, sputum, wheezing.  GI: See history of present illness. GU:  Negative for dysuria, hematuria, urinary incontinence, urinary frequency, nocturnal urination.  MS: Negative for joint pain, low back pain.  Derm: Negative for rash or itching.  Neuro: Negative for weakness, abnormal sensation, seizure, frequent headaches, memory loss,  confusion.  Psych: Negative for anxiety, depression, suicidal ideation, hallucinations.  Endo: Negative for unusual weight change.  Heme: Negative for bruising or bleeding. Allergy: Negative for rash or hives.  Physical Exam   There were no vitals taken for this visit.   General: Well-nourished, well-developed in no acute distress.  Head: Normocephalic, atraumatic.   Eyes: Conjunctiva pink, no icterus. Mouth: Oropharyngeal mucosa moist and pink , no lesions erythema or exudate. Neck: Supple without thyromegaly, masses, or lymphadenopathy.  Lungs: Clear to auscultation bilaterally.  Heart: Regular rate and rhythm, no murmurs rubs or gallops.  Abdomen: Bowel sounds are normal, nontender, nondistended, no hepatosplenomegaly or masses,  no abdominal bruits or hernia, no rebound or guarding.    Rectal: *** Extremities: No lower extremity edema. No clubbing or deformities.  Neuro: Alert and oriented x 4 , grossly normal neurologically.  Skin: Warm and dry, no rash or jaundice.   Psych: Alert and cooperative, normal mood and affect.  Labs   Lab Results  Component Value Date   CREATININE 2.32 (H) 04/03/2023   BUN 44 (H) 04/03/2023   NA 137 04/03/2023   K 4.1 04/03/2023   CL 105 04/03/2023   CO2 23 04/03/2023   Lab Results  Component Value Date   ALT 10 04/03/2023   AST 16 04/03/2023   ALKPHOS 79 04/03/2023   BILITOT 0.6 04/03/2023   Lab Results  Component Value Date   WBC 5.6 04/03/2023   HGB 9.2 (L) 04/03/2023   HCT 29.3 (L) 04/03/2023   MCV 89.6 04/03/2023   PLT 157 04/03/2023   Lab Results  Component Value Date   TSH 4.193 04/03/2023    Imaging Studies   No results found.  Assessment       PLAN   ***   Leanna Battles. Melvyn Neth, MHS, PA-C [facility]

## 2023-05-11 NOTE — Addendum Note (Signed)
Encounter addended by: Robinette Haines, RT-CT on: 05/11/2023 9:28 AM  Actions taken: Imaging Exam ended

## 2023-05-25 DIAGNOSIS — K219 Gastro-esophageal reflux disease without esophagitis: Secondary | ICD-10-CM | POA: Diagnosis not present

## 2023-05-25 DIAGNOSIS — I10 Essential (primary) hypertension: Secondary | ICD-10-CM | POA: Diagnosis not present

## 2023-05-28 DIAGNOSIS — M109 Gout, unspecified: Secondary | ICD-10-CM | POA: Diagnosis not present

## 2023-05-28 DIAGNOSIS — G894 Chronic pain syndrome: Secondary | ICD-10-CM | POA: Diagnosis not present

## 2023-05-28 DIAGNOSIS — M542 Cervicalgia: Secondary | ICD-10-CM | POA: Diagnosis not present

## 2023-05-28 DIAGNOSIS — G959 Disease of spinal cord, unspecified: Secondary | ICD-10-CM | POA: Diagnosis not present

## 2023-05-28 DIAGNOSIS — I129 Hypertensive chronic kidney disease with stage 1 through stage 4 chronic kidney disease, or unspecified chronic kidney disease: Secondary | ICD-10-CM | POA: Diagnosis not present

## 2023-05-28 DIAGNOSIS — K219 Gastro-esophageal reflux disease without esophagitis: Secondary | ICD-10-CM | POA: Diagnosis not present

## 2023-05-28 DIAGNOSIS — M184 Other bilateral secondary osteoarthritis of first carpometacarpal joints: Secondary | ICD-10-CM | POA: Diagnosis not present

## 2023-05-28 DIAGNOSIS — R6889 Other general symptoms and signs: Secondary | ICD-10-CM | POA: Diagnosis not present

## 2023-05-28 DIAGNOSIS — Z79899 Other long term (current) drug therapy: Secondary | ICD-10-CM | POA: Diagnosis not present

## 2023-05-28 DIAGNOSIS — Z79891 Long term (current) use of opiate analgesic: Secondary | ICD-10-CM | POA: Diagnosis not present

## 2023-06-02 ENCOUNTER — Ambulatory Visit (INDEPENDENT_AMBULATORY_CARE_PROVIDER_SITE_OTHER): Payer: Medicare HMO | Admitting: Orthopaedic Surgery

## 2023-06-02 ENCOUNTER — Encounter: Payer: Self-pay | Admitting: Orthopaedic Surgery

## 2023-06-02 DIAGNOSIS — M7062 Trochanteric bursitis, left hip: Secondary | ICD-10-CM

## 2023-06-02 DIAGNOSIS — B351 Tinea unguium: Secondary | ICD-10-CM

## 2023-06-02 MED ORDER — METHYLPREDNISOLONE ACETATE 40 MG/ML IJ SUSP
40.0000 mg | Freq: Once | INTRAMUSCULAR | Status: AC
  Administered 2023-06-02: 40 mg via INTRA_ARTICULAR

## 2023-06-02 NOTE — Progress Notes (Signed)
PROCEDURE NOTE:  The patient request injection, verbal consent was obtained.  The left trochanteric area of the hip was prepped appropriately after time out was performed.   Sterile technique was observed and injection of 1 cc of DepoMedrol 40 mg with several cc's of plain xylocaine. Anesthesia was provided by ethyl chloride and a 20-gauge needle was used to inject the hip area. The injection was tolerated well.  A band aid dressing was applied.  The patient was advised to apply ice later today and tomorrow to the injection sight as needed.  Encounter Diagnoses  Name Primary?   Trochanteric bursitis of left hip Yes   Toenail fungus    I pared her nails of the feet while she was here.  Return prn.  Call if any problem.  Precautions discussed.  Electronically Signed Darreld Mclean, MD 7/16/20243:41 PM

## 2023-06-09 NOTE — Progress Notes (Deleted)
Referring Provider: Benetta Spar, MD Primary Care Physician:  Benetta Spar, MD Primary Gastroenterologist:  Dr. Bonnetta Barry chief complaint on file.   HPI:   Colleen Nunez is a 67 y.o. female presenting today at the request of Fanta, Wayland Salinas, MD for GERD.   History of CKD, HTN, hypothyroidism, depression, chronic anemia with baseline Hgb in 10 range, hepatitis C s/p treatment with Mavyret in 2019 achieving SVR.    Today:    Colonoscopy 07/04/2015 with 3 mm cecal polyp ablated via cold biopsy, few small diverticula throughout the colon.  Polyp pathology was benign.  Recommended 10-year repeat.  Past Medical History:  Diagnosis Date   Anemia    Anxiety    Arthritis    Chronic hip pain    Chronic knee pain    Depression    h/o suicide attempts in the past   GERD (gastroesophageal reflux disease)    Gout    Hepatitis C antibody test positive        Hypertension    Polysubstance abuse (HCC)    h/o   Recurrent falls    Renal insufficiency     Past Surgical History:  Procedure Laterality Date   ABDOMINAL HYSTERECTOMY     ANTERIOR CERVICAL CORPECTOMY N/A 06/09/2013   Procedure: C4 C5 Corpectomy with C6-7 Anterior cervical fusion/Peek cage/Trestle plate;  Surgeon: Clydene Fake, MD;  Location: [facility];  Service: Neurosurgery;  Laterality: N/A;  Cervical Four, Cervical Five Corpectomy and Cervical six-seven Anterior cervical fusion/Peek cage Three-Five /Trestle Plating Cervical Three to Cervical Seven   ANTERIOR CERVICAL DECOMP/DISCECTOMY FUSION N/A 08/12/2013   Procedure: Repair of Anterior  Cervical CSF LEAK, lumbar drain placement.;  Surgeon: Karn Cassis, MD;  Location: [facility] OR;  Service: Neurosurgery;  Laterality: N/A;   bilateral SOO and appendectomy  2006   cartilage removal     left knee   COLONOSCOPY N/A 07/04/2015   Procedure: COLONOSCOPY;  Surgeon: Malissa Hippo, MD;  Location: [facility];  Service: Endoscopy;  Laterality:  N/A;  100   KNEE ARTHROSCOPY     X-STOP IMPLANTATION      Current Outpatient Medications  Medication Sig Dispense Refill   acetaminophen (TYLENOL) 500 MG tablet Take 500 mg by mouth every 6 (six) hours as needed for mild pain or moderate pain.     allopurinol (ZYLOPRIM) 100 MG tablet Take 100 mg by mouth daily.     amLODipine (NORVASC) 10 MG tablet Take 0.5 tablets (5 mg total) by mouth daily. (Patient taking differently: Take 10 mg by mouth daily.)     amoxicillin-clavulanate (AUGMENTIN) 875-125 MG tablet Take 1 tablet by mouth every 12 (twelve) hours. 20 tablet 0   aspirin 325 MG EC tablet Take 325 mg by mouth daily.     calcium-vitamin D (OSCAL WITH D) 500-200 MG-UNIT tablet Take 1 tablet by mouth.     chlorhexidine (PERIDEX) 0.12 % solution Use as directed 15 mLs in the mouth or throat 2 (two) times daily. 473 mL 0   Cholecalciferol (VITAMIN D3) 50 MCG (2000 UT) capsule Take by mouth.     DULoxetine (CYMBALTA) 20 MG capsule Take 20 mg by mouth daily.     gabapentin (NEURONTIN) 300 MG capsule Take 300 mg by mouth 2 (two) times daily as needed (for pain).     HYDROcodone-acetaminophen (NORCO/VICODIN) 5-325 MG tablet Take 1 tablet by mouth 2 (two) times daily as needed for moderate pain. 30 tablet 0  HYDROcodone-acetaminophen (NORCO/VICODIN) 5-325 MG tablet One tablet every six hours for pain.  Limit 7 days. 28 tablet 0   hydrOXYzine (ATARAX/VISTARIL) 25 MG tablet Take 25 mg by mouth 3 (three) times daily as needed.     hydrOXYzine (VISTARIL) 25 MG capsule Take by mouth.     iron polysaccharides (NIFEREX) 150 MG capsule Take 150 mg by mouth daily.     levothyroxine (SYNTHROID) 50 MCG tablet Take 50 mcg by mouth every morning.     lidocaine (LIDODERM) 5 %      lisinopril (ZESTRIL) 5 MG tablet lisinopril 5 mg tablet  Take 1 tablet every day by oral route.     magic mouthwash (lidocaine, diphenhydrAMINE, alum & mag hydroxide) suspension Swish and spit 5 mLs 4 (four) times daily as needed for  mouth pain. 360 mL 0   methocarbamol (ROBAXIN) 500 MG tablet Take 1 tablet (500 mg total) by mouth 2 (two) times daily. 20 tablet 0   MITIGARE 0.6 MG CAPS Take 1 capsule by mouth 2 (two) times daily.     naloxegol oxalate (MOVANTIK) 25 MG TABS tablet Take by mouth daily.     naproxen (NAPROSYN) 500 MG tablet Take 1 tablet (500 mg total) by mouth 2 (two) times daily. 20 tablet 0   nystatin (MYCOSTATIN/NYSTOP) powder APPLY TO AFFECTED AREA 2UTIMES A DAY.     oxyCODONE-acetaminophen (PERCOCET/ROXICET) 5-325 MG tablet SMARTSIG:1 Tablet(s) By Mouth Every 12 Hours     potassium chloride (K-DUR) 10 MEQ tablet Take 10 mEq by mouth daily.     predniSONE (DELTASONE) 20 MG tablet Take 20 mg by mouth daily.     RELISTOR 150 MG TABS Take 3 tablets by mouth every morning.     senna (SENOKOT) 8.6 MG tablet Take 1 tablet by mouth daily.     tiZANidine (ZANAFLEX) 4 MG tablet Take 4 mg by mouth every 8 (eight) hours.     traMADol (ULTRAM) 50 MG tablet Take 1 tablet (50 mg total) by mouth every 6 (six) hours as needed. 10 tablet 0   No current facility-administered medications for this visit.    Allergies as of 06/11/2023 - Review Complete 06/02/2023  Allergen Reaction Noted   Aspirin  12/03/2007   Oxycodone Nausea And Vomiting 06/30/2015    Family History  Problem Relation Age of Onset   Diabetes Sister    Diabetes Mother        deceased age 54   Kidney failure Mother    Alcohol abuse Father    Colon cancer Neg Hx     Social History   Socioeconomic History   Marital status: Single    Spouse name: Not on file   Number of children: 2   Years of education: Not on file   Highest education level: Not on file  Occupational History   Not on file  Tobacco Use   Smoking status: Never    Passive exposure: Yes   Smokeless tobacco: Never  Vaping Use   Vaping status: Never Used  Substance and Sexual Activity   Alcohol use: Yes    Alcohol/week: 3.0 standard drinks of alcohol    Types: 3 Cans of  beer per week    Comment: occasionally   Drug use: Not Currently    Comment: h/o crack cocaine in past. Clean since 2006   Sexual activity: Yes    Birth control/protection: Surgical  Other Topics Concern   Not on file  Social History Narrative   Not on file  Social Determinants of Health   Financial Resource Strain: Not on file  Food Insecurity: Not on file  Transportation Needs: Not on file  Physical Activity: Not on file  Stress: Not on file  Social Connections: Not on file  Intimate Partner Violence: Not on file    Review of Systems: Gen: Denies any fever, chills, fatigue, weight loss, lack of appetite.  CV: Denies chest pain, heart palpitations, peripheral edema, syncope.  Resp: Denies shortness of breath at rest or with exertion. Denies wheezing or cough.  GI: Denies dysphagia or odynophagia. Denies jaundice, hematemesis, fecal incontinence. GU : Denies urinary burning, urinary frequency, urinary hesitancy MS: Denies joint pain, muscle weakness, cramps, or limitation of movement.  Derm: Denies rash, itching, dry skin Psych: Denies depression, anxiety, memory loss, and confusion Heme: Denies bruising, bleeding, and enlarged lymph nodes.  Physical Exam: There were no vitals taken for this visit. General:   Alert and oriented. Pleasant and cooperative. Well-nourished and well-developed.  Head:  Normocephalic and atraumatic. Eyes:  Without icterus, sclera clear and conjunctiva pink.  Ears:  Normal auditory acuity. Lungs:  Clear to auscultation bilaterally. No wheezes, rales, or rhonchi. No distress.  Heart:  S1, S2 present without murmurs appreciated.  Abdomen:  +BS, soft, non-tender and non-distended. No HSM noted. No guarding or rebound. No masses appreciated.  Rectal:  Deferred  Msk:  Symmetrical without gross deformities. Normal posture. Extremities:  Without edema. Neurologic:  Alert and  oriented x4;  grossly normal neurologically. Skin:  Intact without  significant lesions or rashes. Psych:  Alert and cooperative. Normal mood and affect.    Assessment:     Plan:  ***   Ermalinda Memos, PA-C [facility] 06/11/2023

## 2023-06-11 ENCOUNTER — Encounter: Payer: Self-pay | Admitting: Gastroenterology

## 2023-06-11 ENCOUNTER — Ambulatory Visit: Payer: Medicare HMO | Admitting: Gastroenterology

## 2023-06-25 DIAGNOSIS — K219 Gastro-esophageal reflux disease without esophagitis: Secondary | ICD-10-CM | POA: Diagnosis not present

## 2023-06-25 DIAGNOSIS — I10 Essential (primary) hypertension: Secondary | ICD-10-CM | POA: Diagnosis not present

## 2023-06-25 DIAGNOSIS — M109 Gout, unspecified: Secondary | ICD-10-CM | POA: Diagnosis not present

## 2023-06-25 DIAGNOSIS — G959 Disease of spinal cord, unspecified: Secondary | ICD-10-CM | POA: Diagnosis not present

## 2023-06-25 DIAGNOSIS — R6889 Other general symptoms and signs: Secondary | ICD-10-CM | POA: Diagnosis not present

## 2023-06-25 DIAGNOSIS — Z79891 Long term (current) use of opiate analgesic: Secondary | ICD-10-CM | POA: Diagnosis not present

## 2023-06-25 DIAGNOSIS — M542 Cervicalgia: Secondary | ICD-10-CM | POA: Diagnosis not present

## 2023-06-25 DIAGNOSIS — N184 Chronic kidney disease, stage 4 (severe): Secondary | ICD-10-CM | POA: Diagnosis not present

## 2023-06-25 DIAGNOSIS — I129 Hypertensive chronic kidney disease with stage 1 through stage 4 chronic kidney disease, or unspecified chronic kidney disease: Secondary | ICD-10-CM | POA: Diagnosis not present

## 2023-06-25 DIAGNOSIS — G894 Chronic pain syndrome: Secondary | ICD-10-CM | POA: Diagnosis not present

## 2023-07-15 DIAGNOSIS — R6889 Other general symptoms and signs: Secondary | ICD-10-CM | POA: Diagnosis not present

## 2023-07-16 ENCOUNTER — Ambulatory Visit (HOSPITAL_COMMUNITY)
Admission: RE | Admit: 2023-07-16 | Discharge: 2023-07-16 | Disposition: A | Discharge status: Home / Self Care | Payer: Medicare HMO | Source: Ambulatory Visit | Attending: Gerontology | Admitting: Gerontology

## 2023-07-16 DIAGNOSIS — R6889 Other general symptoms and signs: Secondary | ICD-10-CM | POA: Diagnosis not present

## 2023-07-16 DIAGNOSIS — Z13820 Encounter for screening for osteoporosis: Secondary | ICD-10-CM | POA: Diagnosis not present

## 2023-07-16 DIAGNOSIS — M069 Rheumatoid arthritis, unspecified: Secondary | ICD-10-CM | POA: Insufficient documentation

## 2023-07-16 DIAGNOSIS — Z780 Asymptomatic menopausal state: Secondary | ICD-10-CM | POA: Diagnosis not present

## 2023-07-16 DIAGNOSIS — M810 Age-related osteoporosis without current pathological fracture: Secondary | ICD-10-CM | POA: Insufficient documentation

## 2023-07-23 DIAGNOSIS — G894 Chronic pain syndrome: Secondary | ICD-10-CM | POA: Diagnosis not present

## 2023-07-23 DIAGNOSIS — M542 Cervicalgia: Secondary | ICD-10-CM | POA: Diagnosis not present

## 2023-07-23 DIAGNOSIS — N184 Chronic kidney disease, stage 4 (severe): Secondary | ICD-10-CM | POA: Diagnosis not present

## 2023-07-23 DIAGNOSIS — K219 Gastro-esophageal reflux disease without esophagitis: Secondary | ICD-10-CM | POA: Diagnosis not present

## 2023-07-23 DIAGNOSIS — I10 Essential (primary) hypertension: Secondary | ICD-10-CM | POA: Diagnosis not present

## 2023-07-23 DIAGNOSIS — G959 Disease of spinal cord, unspecified: Secondary | ICD-10-CM | POA: Diagnosis not present

## 2023-07-23 DIAGNOSIS — R6889 Other general symptoms and signs: Secondary | ICD-10-CM | POA: Diagnosis not present

## 2023-07-23 DIAGNOSIS — Z79891 Long term (current) use of opiate analgesic: Secondary | ICD-10-CM | POA: Diagnosis not present

## 2023-07-23 DIAGNOSIS — M109 Gout, unspecified: Secondary | ICD-10-CM | POA: Diagnosis not present

## 2023-07-25 NOTE — Progress Notes (Addendum)
Referring Provider: Benetta Spar, MD Primary Care Physician:  Benetta Spar, MD Primary Gastroenterologist:  Dr. Marletta Lor  Chief Complaint  Patient presents with   Abdominal Pain    Having abdominal pain and constipation. Was given samples of a stool softener by her pain mgmt doctor but unsure what the name of it is. States that she was told to take three of them at once but decided to try only taking one and it worked her for three days.     HPI:   Colleen Nunez is a 67 y.o. female presenting today at the request of  Fanta, Wayland Salinas, MD  for GERD.    History of CKD, HTN, hypothyroidism, depression, chronic anemia with baseline Hgb in 10 range, hepatitis C s/p treatment with Mavyret in 2019 achieving SVR.      Today:  Constipation, hard stools. Will skip a week or more without a BM. Pain doctor gave her samples of Relistor, but this causes significant diarrhea. Has some bright red blood on stools intermittently for the last couple years. Occurs when she is straining. No melena. Associated lower abdominal pain that resolved with a BM.  No unintentional weight loss.  Can't swallow big pills since having neck surgery a few years ago. Can't eat hard foods. Items get stuck in her chest/throat. Used to take something for acid reflux, but has been out of it for a while. Can't remember the name of the medication.  Has indigestion daily.   Drinks 2-3 beer beer a few days a week.  NSAIDs: 1 Advil daily for the last week for pain in her hand.   No prior EGD.    Colonoscopy 07/04/2015 with 3 mm cecal polyp ablated via cold biopsy, few small diverticula throughout the colon.  Polyp pathology was benign.  Recommended 10-year repeat.     Past Medical History:  Diagnosis Date   Anemia    Anxiety    Arthritis    Chronic hip pain    Chronic knee pain    Depression    h/o suicide attempts in the past   GERD (gastroesophageal reflux disease)    Gout    Hepatitis C  antibody test positive        Hypertension    Polysubstance abuse (HCC)    h/o   Recurrent falls    Renal insufficiency     Past Surgical History:  Procedure Laterality Date   ABDOMINAL HYSTERECTOMY     ANTERIOR CERVICAL CORPECTOMY N/A 06/09/2013   Procedure: C4 C5 Corpectomy with C6-7 Anterior cervical fusion/Peek cage/Trestle plate;  Surgeon: Clydene Fake, MD;  Location: [facility];  Service: Neurosurgery;  Laterality: N/A;  Cervical Four, Cervical Five Corpectomy and Cervical six-seven Anterior cervical fusion/Peek cage Three-Five /Trestle Plating Cervical Three to Cervical Seven   ANTERIOR CERVICAL DECOMP/DISCECTOMY FUSION N/A 08/12/2013   Procedure: Repair of Anterior  Cervical CSF LEAK, lumbar drain placement.;  Surgeon: Karn Cassis, MD;  Location: [facility] OR;  Service: Neurosurgery;  Laterality: N/A;   bilateral SOO and appendectomy  2006   cartilage removal     left knee   COLONOSCOPY N/A 07/04/2015   Procedure: COLONOSCOPY;  Surgeon: Malissa Hippo, MD;  Location: [facility];  Service: Endoscopy;  Laterality: N/A;  100   KNEE ARTHROSCOPY     X-STOP IMPLANTATION      Current Outpatient Medications  Medication Sig Dispense Refill   alendronate (FOSAMAX) 70 MG tablet Take 70 mg by mouth  once a week.     allopurinol (ZYLOPRIM) 100 MG tablet Take 100 mg by mouth daily.     amLODipine (NORVASC) 10 MG tablet Take 0.5 tablets (5 mg total) by mouth daily.     gabapentin (NEURONTIN) 300 MG capsule Take 300 mg by mouth 2 (two) times daily as needed (for pain).     lidocaine (LIDODERM) 5 %      omeprazole (PRILOSEC) 40 MG capsule Take 1 capsule (40 mg total) by mouth daily before breakfast. Open capsule and dispense contents into applesauce to take. 30 capsule 3   oxyCODONE-acetaminophen (PERCOCET/ROXICET) 5-325 MG tablet SMARTSIG:1 Tablet(s) By Mouth Every 12 Hours     tiZANidine (ZANAFLEX) 4 MG tablet Take 4 mg by mouth every 8 (eight) hours.     No current  facility-administered medications for this visit.    Allergies as of 07/27/2023 - Review Complete 07/27/2023  Allergen Reaction Noted   Aspirin  12/03/2007   Oxycodone Nausea And Vomiting 06/30/2015    Family History  Problem Relation Age of Onset   Diabetes Sister    Diabetes Mother        deceased age 37   Kidney failure Mother    Alcohol abuse Father    Colon cancer Neg Hx     Social History   Socioeconomic History   Marital status: Single    Spouse name: Not on file   Number of children: 2   Years of education: Not on file   Highest education level: Not on file  Occupational History   Not on file  Tobacco Use   Smoking status: Never    Passive exposure: Yes   Smokeless tobacco: Never  Vaping Use   Vaping status: Never Used  Substance and Sexual Activity   Alcohol use: Yes    Alcohol/week: 3.0 standard drinks of alcohol    Types: 3 Cans of beer per week    Comment: occasionally   Drug use: Not Currently    Comment: h/o crack cocaine in past. Clean since 2006   Sexual activity: Yes    Birth control/protection: Surgical  Other Topics Concern   Not on file  Social History Narrative   Not on file   Social Determinants of Health   Financial Resource Strain: Not on file  Food Insecurity: Not on file  Transportation Needs: Not on file  Physical Activity: Not on file  Stress: Not on file  Social Connections: Not on file  Intimate Partner Violence: Not on file    Review of Systems: Gen: Denies any fever, chills, cold or flulike symptoms, presyncope, syncope. CV: Denies chest pain, heart palpitations. Resp: Denies shortness of breath, cough. GI: See HPI GU : Denies urinary burning, urinary frequency, urinary hesitancy MS: Denies joint pain. Derm: Denies rash. Psych: Denies depression, anxiety. Heme: See HPI  Physical Exam: BP 137/83 (BP Location: Right Arm, Patient Position: Sitting, Cuff Size: Large)   Pulse 79   Temp 98 F (36.7 C) (Oral)   Ht 5'  2" (1.575 m)   Wt 165 lb (74.8 kg)   SpO2 96%   BMI 30.18 kg/m  General:   Alert and oriented. Pleasant and cooperative. Well-nourished and well-developed.  Head:  Normocephalic and atraumatic. Eyes:  Without icterus, sclera clear and conjunctiva pink.  Ears:  Normal auditory acuity. Lungs:  Clear to auscultation bilaterally. No wheezes, rales, or rhonchi. No distress.  Heart:  S1, S2 present without murmurs appreciated.  Abdomen:  +BS, soft, non-tender and non-distended.  No HSM noted. No guarding or rebound. No masses appreciated.  Rectal:  Deferred  Msk:  Symmetrical without gross deformities. Normal posture. Extremities:  Without edema. Neurologic:  Alert and  oriented x4;  grossly normal neurologically. Skin:  Intact without significant lesions or rashes. Psych:  Normal mood and affect.    Assessment:  67 year old female with history of CKD, HTN, hypothyroidism, depression, chronic anemia with baseline Hgb in 10 range, hepatitis C s/p treatment with Mavyret in 2019 achieving SVR, presenting today with multiple complaints including constipation, lower abdominal pain, rectal bleeding, dysphagia, indigestion.  Constipation/lower abdominal pain: Likely opioid-induced constipation. Could have component of IBS as well as she notes associated lower abdominal pain that will improve with a bowel movement.  Pain doctor recently prescribed Relistor which is too strong. We will try her on Linzess 290 mcg daily.   Rectal bleeding: Intermittent rectal bleeding in the setting of constipation and straining.  Likely related to benign anorectal source, but as symptoms started within the last couple of years and her last colonoscopy was in 2016, recommend updating colonoscopy at this time.  GERD: Reports daily indigestion.  Previously was on something for GERD, but unable to recall the name and hasn't been on therapy in quite some time. I will start her on omeprazole 40 mg daily.  Also counseled on  GERD diet/lifestyle.  Dysphagia: Solid food and pill dysphagia present for several years.  Reports symptoms initially started after neck surgery.  She is never had an EGD.  Also has chronic GERD which is currently uncontrolled.  May have esophageal web, ring, stricture.  Recommended EGD for further evaluation.   Anemia: Chronic.  Baseline hemoglobin in the 10 range though no labs on file since 2022 with hemoglobin 10.8 at that time.  She reports couple year history of intermittent rectal bleeding.  I will update CBC and also obtain iron panel.    Plan:  CBC, iron panel Proceed with upper endoscopy +/- dilation + colonoscopy with propofol by Dr. Marletta Lor in near future. The risks, benefits, and alternatives have been discussed with the patient in detail. The patient states understanding and desires to proceed.  ASA 3 UDS prior Start Linzess 290 mcg daily.  Advised open capsule and dispense contents into applesauce to take due to dysphagia.  Samples provided.  Requested progress report next week. Start omeprazole 40 mg daily 30 minutes before breakfast.  Advised to open capsule and dispense contents into applesauce to take due to dysphagia. Reinforced GERD diet/lifestyle.  Written instructions provided on AVS Avoid NSAIDs. Follow-up after procedures.   Ermalinda Memos, PA-C [facility] 07/27/2023

## 2023-07-26 DIAGNOSIS — K219 Gastro-esophageal reflux disease without esophagitis: Secondary | ICD-10-CM | POA: Diagnosis not present

## 2023-07-26 DIAGNOSIS — I10 Essential (primary) hypertension: Secondary | ICD-10-CM | POA: Diagnosis not present

## 2023-07-27 ENCOUNTER — Encounter: Payer: Self-pay | Admitting: Gastroenterology

## 2023-07-27 ENCOUNTER — Ambulatory Visit (INDEPENDENT_AMBULATORY_CARE_PROVIDER_SITE_OTHER): Payer: Medicare HMO | Admitting: Gastroenterology

## 2023-07-27 VITALS — BP 137/83 | HR 79 | Temp 98.0°F | Ht 62.0 in | Wt 165.0 lb

## 2023-07-27 DIAGNOSIS — D649 Anemia, unspecified: Secondary | ICD-10-CM | POA: Diagnosis not present

## 2023-07-27 DIAGNOSIS — R1310 Dysphagia, unspecified: Secondary | ICD-10-CM

## 2023-07-27 DIAGNOSIS — K625 Hemorrhage of anus and rectum: Secondary | ICD-10-CM

## 2023-07-27 DIAGNOSIS — K219 Gastro-esophageal reflux disease without esophagitis: Secondary | ICD-10-CM | POA: Diagnosis not present

## 2023-07-27 DIAGNOSIS — K5903 Drug induced constipation: Secondary | ICD-10-CM

## 2023-07-27 MED ORDER — OMEPRAZOLE 40 MG PO CPDR: 40.0000 mg | DELAYED_RELEASE_CAPSULE | Freq: Every day | ORAL | 3 refills | Status: AC

## 2023-07-27 NOTE — Patient Instructions (Signed)
Please have blood work completed at [facility].  We will arrange you to have an upper endoscopy with possible stretching of your esophagus and colonoscopy in the near future with Dr. Marletta Lor. You will have a urine drug screen prior to your procedures. We will provide you with separate instructions for colon prep.  For constipation: Start Linzess 290 mcg daily 30 minutes before breakfast.  Open the capsule and dispense contents into applesauce, then take the medication. As we discussed, you may experience diarrhea for the first 1-2 weeks, but this should taper off. Please call next week with a progress report.  If this works well, I will send a prescription to your pharmacy.  For indigestion/reflux: Start omeprazole 40 mg daily 30 minutes before breakfast. Open the capsule and dispense contents into applesauce, then take the medication. Follow a GERD diet:  Avoid fried, fatty, greasy, spicy, citrus foods. Avoid caffeine and carbonated beverages. Avoid chocolate. Avoid alcohol. Try eating 4-6 small meals a day rather than 3 large meals. Do not eat within 3 hours of laying down. Prop head of bed up on wood or bricks to create a 6 inch incline.  I recommend that you avoid all NSAID products including ibuprofen, Aleve, Advil, BC powders, Goody powders, and anything that says "NSAID" the package as these medications can cause inflammation within the GI tract.  We will follow-up with you in the office after your procedures.  Do not hesitate to call sooner if you have questions or concerns.  It was nice to meet you today!  Ermalinda Memos, PA-C [facility]astroenter[facility]

## 2023-08-04 ENCOUNTER — Telehealth: Payer: Self-pay | Admitting: *Deleted

## 2023-08-04 ENCOUNTER — Other Ambulatory Visit: Payer: Self-pay | Admitting: *Deleted

## 2023-08-04 ENCOUNTER — Encounter: Payer: Self-pay | Admitting: *Deleted

## 2023-08-04 MED ORDER — PEG 3350-KCL-NA BICARB-NACL 420 G PO SOLR
4000.0000 mL | Freq: Once | ORAL | 0 refills | Status: AC
Start: 2023-08-04 — End: 2023-08-04

## 2023-08-04 NOTE — Telephone Encounter (Signed)
[employer] PA: Approved Authorization #[id number]  Tracking #[id number] Dates of service 09/07/2023 - 12/07/2023

## 2023-08-05 ENCOUNTER — Encounter: Payer: Self-pay | Admitting: *Deleted

## 2023-08-13 ENCOUNTER — Other Ambulatory Visit (HOSPITAL_COMMUNITY)
Admission: RE | Admit: 2023-08-13 | Discharge: 2023-08-13 | Disposition: A | Discharge status: Home / Self Care | Payer: Medicare HMO | Source: Ambulatory Visit | Attending: Gastroenterology | Admitting: Gastroenterology

## 2023-08-13 DIAGNOSIS — R6889 Other general symptoms and signs: Secondary | ICD-10-CM | POA: Diagnosis not present

## 2023-08-13 DIAGNOSIS — D649 Anemia, unspecified: Secondary | ICD-10-CM | POA: Diagnosis not present

## 2023-08-13 LAB — CBC WITH DIFFERENTIAL/PLATELET
Abs Immature Granulocytes: 0.03 10*3/uL (ref 0.00–0.07)
Basophils Absolute: 0 10*3/uL (ref 0.0–0.1)
Basophils Relative: 1 %
Eosinophils Absolute: 0.3 10*3/uL (ref 0.0–0.5)
Eosinophils Relative: 4 %
HCT: 31.5 % — ABNORMAL LOW (ref 36.0–46.0)
Hemoglobin: 10.1 g/dL — ABNORMAL LOW (ref 12.0–15.0)
Immature Granulocytes: 0 %
Lymphocytes Relative: 37 %
Lymphs Abs: 2.9 10*3/uL (ref 0.7–4.0)
MCH: 28.6 pg (ref 26.0–34.0)
MCHC: 32.1 g/dL (ref 30.0–36.0)
MCV: 89.2 fL (ref 80.0–100.0)
Monocytes Absolute: 0.5 10*3/uL (ref 0.1–1.0)
Monocytes Relative: 6 %
Neutro Abs: 4.1 10*3/uL (ref 1.7–7.7)
Neutrophils Relative %: 52 %
Platelets: 142 10*3/uL — ABNORMAL LOW (ref 150–400)
RBC: 3.53 MIL/uL — ABNORMAL LOW (ref 3.87–5.11)
RDW: 15 % (ref 11.5–15.5)
WBC: 7.9 10*3/uL (ref 4.0–10.5)
nRBC: 0 % (ref 0.0–0.2)

## 2023-08-13 LAB — IRON AND TIBC
Iron: 36 ug/dL (ref 28–170)
Saturation Ratios: 13 % (ref 10.4–31.8)
TIBC: 277 ug/dL (ref 250–450)
UIBC: 241 ug/dL

## 2023-08-13 LAB — FERRITIN: Ferritin: 58 ng/mL (ref 11–307)

## 2023-08-20 DIAGNOSIS — M109 Gout, unspecified: Secondary | ICD-10-CM | POA: Diagnosis not present

## 2023-08-20 DIAGNOSIS — G959 Disease of spinal cord, unspecified: Secondary | ICD-10-CM | POA: Diagnosis not present

## 2023-08-20 DIAGNOSIS — Z79891 Long term (current) use of opiate analgesic: Secondary | ICD-10-CM | POA: Diagnosis not present

## 2023-08-20 DIAGNOSIS — M542 Cervicalgia: Secondary | ICD-10-CM | POA: Diagnosis not present

## 2023-08-20 DIAGNOSIS — R6889 Other general symptoms and signs: Secondary | ICD-10-CM | POA: Diagnosis not present

## 2023-08-20 DIAGNOSIS — N184 Chronic kidney disease, stage 4 (severe): Secondary | ICD-10-CM | POA: Diagnosis not present

## 2023-08-20 DIAGNOSIS — I129 Hypertensive chronic kidney disease with stage 1 through stage 4 chronic kidney disease, or unspecified chronic kidney disease: Secondary | ICD-10-CM | POA: Diagnosis not present

## 2023-08-20 DIAGNOSIS — K219 Gastro-esophageal reflux disease without esophagitis: Secondary | ICD-10-CM | POA: Diagnosis not present

## 2023-08-20 DIAGNOSIS — G894 Chronic pain syndrome: Secondary | ICD-10-CM | POA: Diagnosis not present

## 2023-08-25 DIAGNOSIS — K219 Gastro-esophageal reflux disease without esophagitis: Secondary | ICD-10-CM | POA: Diagnosis not present

## 2023-08-25 DIAGNOSIS — I10 Essential (primary) hypertension: Secondary | ICD-10-CM | POA: Diagnosis not present

## 2023-09-01 NOTE — Patient Instructions (Signed)
Colleen Nunez  09/01/2023     @PREFPERIOPPHARMACY @   Your procedure is scheduled on  09/07/2023.   Report to [facility] at  1045  A.M.   Call this number if you have problems the morning of surgery:  [phone]  If you experience any cold or flu symptoms such as cough, fever, chills, shortness of breath, etc. between now and your scheduled surgery, please notify us at the above number.   Remember:  Follow the diet and prep instructions given to you by the office.    You may drink clear liquids until 0845 on 09/07/2023.    Clear liquids allowed are:                    Water, Juice (No red color; non-citric and without pulp; diabetics please choose diet or no sugar options), Carbonated beverages (diabetics please choose diet or no sugar options), Clear Tea (No creamer, milk, or cream, including half & half and powdered creamer), Black Coffee Only (No creamer, milk or cream, including half & half and powdered creamer), and Clear Sports drink (No red color; diabetics please choose diet or no sugar options)     Take these medicines the morning of surgery with A SIP OF WATER           allopurinol, amlodipine, gabapentin, omeprazole, oxycodone(if needed), tizanidine(if needed),     Do not wear jewelry, make-up or nail polish, including gel polish,  artificial nails, or any other type of covering on natural nails (fingers and  toes).  Do not wear lotions, powders, or perfumes, or deodorant.  Do not shave 48 hours prior to surgery.  Men may shave face and neck.  Do not bring valuables to the hospital.  [facility] is not responsible for any belongings or valuables.  Contacts, dentures or bridgework may not be worn into surgery.  Leave your suitcase in the car.  After surgery it may be brought to your room.  For patients admitted to the hospital, discharge time will be determined by your treatment team.  Patients discharged the day of surgery will not be  allowed to drive home and must have someone with them for 24 hours.    Special instructions:   DO NOT smoke tobacco or vape for 24 hors before your procedure.   Please read over the following fact sheets that you were given. Anesthesia Post-op Instructions and Care and Recovery After Surgery      Upper Endoscopy, Adult, Care After After the procedure, it is common to have a sore throat. It is also common to have: Mild stomach pain or discomfort. Bloating. Nausea. Follow these instructions at home: The instructions below may help you care for yourself at home. Your health care provider may give you more instructions. If you have questions, ask your health care provider. If you were given a sedative during the procedure, it can affect you for several hours. Do not drive or operate machinery until your health care provider says that it is safe. If you will be going home right after the procedure, plan to have a responsible adult: Take you home from the hospital or clinic. You will not be allowed to drive. Care for you for the time you are told. Follow instructions from your health care provider about what you may eat and drink. Return to your normal activities as told by your health care provider. Ask your health care  provider what activities are safe for you. Take over-the-counter and prescription medicines only as told by your health care provider. Contact a health care provider if you: Have a sore throat that lasts longer than one day. Have trouble swallowing. Have a fever. Get help right away if you: Vomit blood or your vomit looks like coffee grounds. Have bloody, black, or tarry stools. Have a very bad sore throat or you cannot swallow. Have difficulty breathing or very bad pain in your chest or abdomen. These symptoms may be an emergency. Get help right away. Call 911. Do not wait to see if the symptoms will go away. Do not drive yourself to the hospital. Summary After the  procedure, it is common to have a sore throat, mild stomach discomfort, bloating, and nausea. If you were given a sedative during the procedure, it can affect you for several hours. Do not drive until your health care provider says that it is safe. Follow instructions from your health care provider about what you may eat and drink. Return to your normal activities as told by your health care provider. This information is not intended to replace advice given to you by your health care provider. Make sure you discuss any questions you have with your health care provider. Document Revised: 02/12/2022 Document Reviewed: 02/12/2022 Elsevier Patient Education  2024 [employer] Esophageal Dilatation Esophageal dilatation, also called esophageal dilation, is a procedure to widen or open a blocked or narrowed part of the esophagus. The esophagus is the part of the body that moves food and liquid from the mouth to the stomach. You may need this procedure if: You have a buildup of scar tissue in your esophagus that makes it difficult, painful, or impossible to swallow. This can be caused by gastroesophageal reflux disease (GERD). You have cancer of the esophagus. There is a problem with how food moves through your esophagus. In some cases, you may need this procedure repeated at a later time to dilate the esophagus gradually. Tell a health care provider about: Any allergies you have. All medicines you are taking, including vitamins, herbs, eye drops, creams, and over-the-counter medicines. Any problems you or family members have had with anesthetic medicines. Any blood disorders you have. Any surgeries you have had. Any medical conditions you have. Any antibiotic medicines you are required to take before dental procedures. Whether you are pregnant or may be pregnant. What are the risks? Generally, this is a safe procedure. However, problems may occur, including: Bleeding due to a tear in the lining  of the esophagus. A hole, or perforation, in the esophagus. What happens before the procedure? Ask your health care provider about: Changing or stopping your regular medicines. This is especially important if you are taking diabetes medicines or blood thinners. Taking medicines such as aspirin and ibuprofen. These medicines can thin your blood. Do not take these medicines unless your health care provider tells you to take them. Taking over-the-counter medicines, vitamins, herbs, and supplements. Follow instructions from your health care provider about eating or drinking restrictions. Plan to have a responsible adult take you home from the hospital or clinic. Plan to have a responsible adult care for you for the time you are told after you leave the hospital or clinic. This is important. What happens during the procedure? You may be given a medicine to help you relax (sedative). A numbing medicine may be sprayed into the back of your throat, or you may gargle the medicine. Your health care provider  may perform the dilatation using various surgical instruments, such as: Simple dilators. This instrument is carefully placed in the esophagus to stretch it. Guided wire bougies. This involves using an endoscope to insert a wire into the esophagus. A dilator is passed over this wire to enlarge the esophagus. Then the wire is removed. Balloon dilators. An endoscope with a small balloon is inserted into the esophagus. The balloon is inflated to stretch the esophagus and open it up. The procedure may vary among health care providers and hospitals. What can I expect after the procedure? Your blood pressure, heart rate, breathing rate, and blood oxygen level will be monitored until you leave the hospital or clinic. Your throat may feel slightly sore and numb. This will get better over time. You will not be allowed to eat or drink until your throat is no longer numb. When you are able to drink, urinate, and  sit on the edge of the bed without nausea or dizziness, you may be able to return home. Follow these instructions at home: Take over-the-counter and prescription medicines only as told by your health care provider. If you were given a sedative during the procedure, it can affect you for several hours. Do not drive or operate machinery until your health care provider says that it is safe. Plan to have a responsible adult care for you for the time you are told. This is important. Follow instructions from your health care provider about any eating or drinking restrictions. Do not use any products that contain nicotine or tobacco, such as cigarettes, e-cigarettes, and chewing tobacco. If you need help quitting, ask your health care provider. Keep all follow-up visits. This is important. Contact a health care provider if: You have a fever. You have pain that is not relieved by medicine. Get help right away if: You have chest pain. You have trouble breathing. You have trouble swallowing. You vomit blood. You have black, tarry, or bloody stools. These symptoms may represent a serious problem that is an emergency. Do not wait to see if the symptoms will go away. Get medical help right away. Call your local emergency services (911 in the [place] Do not drive yourself to the hospital. Summary Esophageal dilatation, also called esophageal dilation, is a procedure to widen or open a blocked or narrowed part of the esophagus. Plan to have a responsible adult take you home from the hospital or clinic. For this procedure, a numbing medicine may be sprayed into the back of your throat, or you may gargle the medicine. Do not drive or operate machinery until your health care provider says that it is safe. This information is not intended to replace advice given to you by your health care provider. Make sure you discuss any questions you have with your health care provider. Document Revised: 03/21/2020 Document  Reviewed: 03/21/2020 Elsevier Patient Education  2024 [employer]. Colonoscopy, Adult, Care After The following information offers guidance on how to care for yourself after your procedure. Your health care provider may also give you more specific instructions. If you have problems or questions, contact your health care provider. What can I expect after the procedure? After the procedure, it is common to have: A small amount of blood in your stool for 24 hours after the procedure. Some gas. Mild cramping or bloating of your abdomen. Follow these instructions at home: Eating and drinking  Drink enough fluid to keep your urine pale yellow. Follow instructions from your health care provider about eating or  drinking restrictions. Resume your normal diet as told by your health care provider. Avoid heavy or fried foods that are hard to digest. Activity Rest as told by your health care provider. Avoid sitting for a long time without moving. Get up to take short walks every 1-2 hours. This is important to improve blood flow and breathing. Ask for help if you feel weak or unsteady. Return to your normal activities as told by your health care provider. Ask your health care provider what activities are safe for you. Managing cramping and bloating  Try walking around when you have cramps or feel bloated. If directed, apply heat to your abdomen as told by your health care provider. Use the heat source that your health care provider recommends, such as a moist heat pack or a heating pad. Place a towel between your skin and the heat source. Leave the heat on for 20-30 minutes. Remove the heat if your skin turns bright red. This is especially important if you are unable to feel pain, heat, or cold. You have a greater risk of getting burned. General instructions If you were given a sedative during the procedure, it can affect you for several hours. Do not drive or operate machinery until your health care  provider says that it is safe. For the first 24 hours after the procedure: Do not sign important documents. Do not drink alcohol. Do your regular daily activities at a slower pace than normal. Eat soft foods that are easy to digest. Take over-the-counter and prescription medicines only as told by your health care provider. Keep all follow-up visits. This is important. Contact a health care provider if: You have blood in your stool 2-3 days after the procedure. Get help right away if: You have more than a small spotting of blood in your stool. You have large blood clots in your stool. You have swelling of your abdomen. You have nausea or vomiting. You have a fever. You have increasing pain in your abdomen that is not relieved with medicine. These symptoms may be an emergency. Get help right away. Call 911. Do not wait to see if the symptoms will go away. Do not drive yourself to the hospital. Summary After the procedure, it is common to have a small amount of blood in your stool. You may also have mild cramping and bloating of your abdomen. If you were given a sedative during the procedure, it can affect you for several hours. Do not drive or operate machinery until your health care provider says that it is safe. Get help right away if you have a lot of blood in your stool, nausea or vomiting, a fever, or increased pain in your abdomen. This information is not intended to replace advice given to you by your health care provider. Make sure you discuss any questions you have with your health care provider. Document Revised: 12/16/2022 Document Reviewed: 06/26/2021 Elsevier Patient Education  2024 Else[employer] Inc. Monitored Anesthesia Care, Care After The following information offers guidance on how to care for yourself after your procedure. Your health care provider may also give you more specific instructions. If you have problems or questions, contact your health care provider. What can I  expect after the procedure? After the procedure, it is common to have: Tiredness. Little or no memory about what happened during or after the procedure. Impaired judgment when it comes to making decisions. Nausea or vomiting. Some trouble with balance. Follow these instructions at home: For the time period you  were told by your health care provider:  Rest. Do not participate in activities where you could fall or become injured. Do not drive or use machinery. Do not drink alcohol. Do not take sleeping pills or medicines that cause drowsiness. Do not make important decisions or sign legal documents. Do not take care of children on your own. Medicines Take over-the-counter and prescription medicines only as told by your health care provider. If you were prescribed antibiotics, take them as told by your health care provider. Do not stop using the antibiotic even if you start to feel better. Eating and drinking Follow instructions from your health care provider about what you may eat and drink. Drink enough fluid to keep your urine pale yellow. If you vomit: Drink clear fluids slowly and in small amounts as you are able. Clear fluids include water, ice chips, low-calorie sports drinks, and fruit juice that has water added to it (diluted fruit juice). Eat light and bland foods in small amounts as you are able. These foods include bananas, applesauce, rice, lean meats, toast, and crackers. General instructions  Have a responsible adult stay with you for the time you are told. It is important to have someone help care for you until you are awake and alert. If you have sleep apnea, surgery and some medicines can increase your risk for breathing problems. Follow instructions from your health care provider about wearing your sleep device: When you are sleeping. This includes during daytime naps. While taking prescription pain medicines, sleeping medicines, or medicines that make you drowsy. Do  not use any products that contain nicotine or tobacco. These products include cigarettes, chewing tobacco, and vaping devices, such as e-cigarettes. If you need help quitting, ask your health care provider. Contact a health care provider if: You feel nauseous or vomit every time you eat or drink. You feel light-headed. You are still sleepy or having trouble with balance after 24 hours. You get a rash. You have a fever. You have redness or swelling around the IV site. Get help right away if: You have trouble breathing. You have new confusion after you get home. These symptoms may be an emergency. Get help right away. Call 911. Do not wait to see if the symptoms will go away. Do not drive yourself to the hospital. This information is not intended to replace advice given to you by your health care provider. Make sure you discuss any questions you have with your health care provider. Document Revised: 03/31/2022 Document Reviewed: 03/31/2022 Elsevier Patient Education  2024 [employer].

## 2023-09-02 ENCOUNTER — Encounter (HOSPITAL_COMMUNITY)
Admission: RE | Admit: 2023-09-02 | Discharge: 2023-09-02 | Disposition: A | Discharge status: Home / Self Care | Payer: Medicare HMO | Source: Ambulatory Visit | Attending: Internal Medicine | Admitting: Internal Medicine

## 2023-09-02 ENCOUNTER — Encounter (HOSPITAL_COMMUNITY): Payer: Self-pay

## 2023-09-02 VITALS — BP 143/88 | HR 74 | Temp 97.6°F | Resp 17 | Ht 62.0 in | Wt 165.2 lb

## 2023-09-02 DIAGNOSIS — F1010 Alcohol abuse, uncomplicated: Secondary | ICD-10-CM | POA: Insufficient documentation

## 2023-09-02 DIAGNOSIS — D649 Anemia, unspecified: Secondary | ICD-10-CM | POA: Insufficient documentation

## 2023-09-02 DIAGNOSIS — Z789 Other specified health status: Secondary | ICD-10-CM

## 2023-09-02 DIAGNOSIS — Z01812 Encounter for preprocedural laboratory examination: Secondary | ICD-10-CM | POA: Insufficient documentation

## 2023-09-02 DIAGNOSIS — I129 Hypertensive chronic kidney disease with stage 1 through stage 4 chronic kidney disease, or unspecified chronic kidney disease: Secondary | ICD-10-CM | POA: Insufficient documentation

## 2023-09-02 DIAGNOSIS — N183 Chronic kidney disease, stage 3 unspecified: Secondary | ICD-10-CM | POA: Diagnosis not present

## 2023-09-02 DIAGNOSIS — I10 Essential (primary) hypertension: Secondary | ICD-10-CM

## 2023-09-02 DIAGNOSIS — N182 Chronic kidney disease, stage 2 (mild): Secondary | ICD-10-CM

## 2023-09-02 HISTORY — DX: Cerebral infarction, unspecified: I63.9

## 2023-09-02 LAB — COMPREHENSIVE METABOLIC PANEL
ALT: 10 U/L (ref 0–44)
AST: 18 U/L (ref 15–41)
Albumin: 3.9 g/dL (ref 3.5–5.0)
Alkaline Phosphatase: 88 U/L (ref 38–126)
Anion gap: 12 (ref 5–15)
BUN: 37 mg/dL — ABNORMAL HIGH (ref 8–23)
CO2: 23 mmol/L (ref 22–32)
Calcium: 9.5 mg/dL (ref 8.9–10.3)
Chloride: 103 mmol/L (ref 98–111)
Creatinine, Ser: 2.66 mg/dL — ABNORMAL HIGH (ref 0.44–1.00)
GFR, Estimated: 19 mL/min — ABNORMAL LOW (ref >60)
Glucose, Bld: 92 mg/dL (ref 70–99)
Potassium: 3.9 mmol/L (ref 3.5–5.1)
Sodium: 138 mmol/L (ref 135–145)
Total Bilirubin: 0.8 mg/dL (ref 0.3–1.2)
Total Protein: 7.7 g/dL (ref 6.5–8.1)

## 2023-09-02 LAB — CBC WITH DIFFERENTIAL/PLATELET
Abs Immature Granulocytes: 0.01 10*3/uL (ref 0.00–0.07)
Basophils Absolute: 0 10*3/uL (ref 0.0–0.1)
Basophils Relative: 1 %
Eosinophils Absolute: 0.3 10*3/uL (ref 0.0–0.5)
Eosinophils Relative: 5 %
HCT: 33.4 % — ABNORMAL LOW (ref 36.0–46.0)
Hemoglobin: 10.5 g/dL — ABNORMAL LOW (ref 12.0–15.0)
Immature Granulocytes: 0 %
Lymphocytes Relative: 28 %
Lymphs Abs: 1.8 10*3/uL (ref 0.7–4.0)
MCH: 28.6 pg (ref 26.0–34.0)
MCHC: 31.4 g/dL (ref 30.0–36.0)
MCV: 91 fL (ref 80.0–100.0)
Monocytes Absolute: 0.5 10*3/uL (ref 0.1–1.0)
Monocytes Relative: 7 %
Neutro Abs: 3.8 10*3/uL (ref 1.7–7.7)
Neutrophils Relative %: 59 %
Platelets: 181 10*3/uL (ref 150–400)
RBC: 3.67 MIL/uL — ABNORMAL LOW (ref 3.87–5.11)
RDW: 14.3 % (ref 11.5–15.5)
WBC: 6.4 10*3/uL (ref 4.0–10.5)
nRBC: 0 % (ref 0.0–0.2)

## 2023-09-07 ENCOUNTER — Encounter (HOSPITAL_COMMUNITY)
Admission: RE | Disposition: A | Discharge status: Home / Self Care | Payer: Self-pay | Source: Home / Self Care | Attending: Internal Medicine

## 2023-09-07 ENCOUNTER — Ambulatory Visit (HOSPITAL_COMMUNITY): Payer: Medicare HMO | Admitting: Anesthesiology

## 2023-09-07 ENCOUNTER — Ambulatory Visit (HOSPITAL_COMMUNITY)
Admission: RE | Admit: 2023-09-07 | Discharge: 2023-09-07 | Disposition: A | Discharge status: Home / Self Care | Payer: Medicare HMO | Attending: Internal Medicine | Admitting: Internal Medicine

## 2023-09-07 ENCOUNTER — Encounter (HOSPITAL_COMMUNITY): Payer: Self-pay

## 2023-09-07 DIAGNOSIS — N189 Chronic kidney disease, unspecified: Secondary | ICD-10-CM | POA: Diagnosis not present

## 2023-09-07 DIAGNOSIS — I10 Essential (primary) hypertension: Secondary | ICD-10-CM | POA: Insufficient documentation

## 2023-09-07 DIAGNOSIS — D649 Anemia, unspecified: Secondary | ICD-10-CM | POA: Diagnosis not present

## 2023-09-07 DIAGNOSIS — K2980 Duodenitis without bleeding: Secondary | ICD-10-CM | POA: Insufficient documentation

## 2023-09-07 DIAGNOSIS — K3189 Other diseases of stomach and duodenum: Secondary | ICD-10-CM

## 2023-09-07 DIAGNOSIS — K449 Diaphragmatic hernia without obstruction or gangrene: Secondary | ICD-10-CM | POA: Insufficient documentation

## 2023-09-07 DIAGNOSIS — N289 Disorder of kidney and ureter, unspecified: Secondary | ICD-10-CM | POA: Insufficient documentation

## 2023-09-07 DIAGNOSIS — K625 Hemorrhage of anus and rectum: Secondary | ICD-10-CM | POA: Insufficient documentation

## 2023-09-07 DIAGNOSIS — F32A Depression, unspecified: Secondary | ICD-10-CM | POA: Diagnosis not present

## 2023-09-07 DIAGNOSIS — D122 Benign neoplasm of ascending colon: Secondary | ICD-10-CM | POA: Insufficient documentation

## 2023-09-07 DIAGNOSIS — K648 Other hemorrhoids: Secondary | ICD-10-CM | POA: Diagnosis not present

## 2023-09-07 DIAGNOSIS — K2950 Unspecified chronic gastritis without bleeding: Secondary | ICD-10-CM | POA: Diagnosis not present

## 2023-09-07 DIAGNOSIS — F419 Anxiety disorder, unspecified: Secondary | ICD-10-CM | POA: Diagnosis not present

## 2023-09-07 DIAGNOSIS — K635 Polyp of colon: Secondary | ICD-10-CM | POA: Diagnosis not present

## 2023-09-07 DIAGNOSIS — K5730 Diverticulosis of large intestine without perforation or abscess without bleeding: Secondary | ICD-10-CM

## 2023-09-07 DIAGNOSIS — R1310 Dysphagia, unspecified: Secondary | ICD-10-CM | POA: Diagnosis not present

## 2023-09-07 DIAGNOSIS — D126 Benign neoplasm of colon, unspecified: Secondary | ICD-10-CM | POA: Diagnosis not present

## 2023-09-07 DIAGNOSIS — I129 Hypertensive chronic kidney disease with stage 1 through stage 4 chronic kidney disease, or unspecified chronic kidney disease: Secondary | ICD-10-CM

## 2023-09-07 DIAGNOSIS — K219 Gastro-esophageal reflux disease without esophagitis: Secondary | ICD-10-CM | POA: Diagnosis not present

## 2023-09-07 DIAGNOSIS — K2990 Gastroduodenitis, unspecified, without bleeding: Secondary | ICD-10-CM | POA: Diagnosis not present

## 2023-09-07 DIAGNOSIS — M1990 Unspecified osteoarthritis, unspecified site: Secondary | ICD-10-CM | POA: Diagnosis not present

## 2023-09-07 DIAGNOSIS — K2970 Gastritis, unspecified, without bleeding: Secondary | ICD-10-CM | POA: Diagnosis not present

## 2023-09-07 DIAGNOSIS — K31A0 Gastric intestinal metaplasia, unspecified: Secondary | ICD-10-CM | POA: Diagnosis not present

## 2023-09-07 DIAGNOSIS — R6889 Other general symptoms and signs: Secondary | ICD-10-CM | POA: Diagnosis not present

## 2023-09-07 HISTORY — PX: ESOPHAGOGASTRODUODENOSCOPY (EGD) WITH PROPOFOL: SHX5813

## 2023-09-07 HISTORY — PX: COLONOSCOPY WITH PROPOFOL: SHX5780

## 2023-09-07 HISTORY — PX: BIOPSY: SHX5522

## 2023-09-07 HISTORY — PX: POLYPECTOMY: SHX5525

## 2023-09-07 SURGERY — COLONOSCOPY WITH PROPOFOL
Anesthesia: General

## 2023-09-07 MED ORDER — LACTATED RINGERS IV SOLN
INTRAVENOUS | Status: DC | PRN
Start: 2023-09-07 — End: 2023-09-07

## 2023-09-07 MED ORDER — PROPOFOL 500 MG/50ML IV EMUL
INTRAVENOUS | Status: DC | PRN
  Administered 2023-09-07: 150 ug/kg/min via INTRAVENOUS

## 2023-09-07 MED ORDER — EPHEDRINE SULFATE (PRESSORS) 50 MG/ML IJ SOLN
INTRAMUSCULAR | Status: DC | PRN
Start: 2023-09-07 — End: 2023-09-07
  Administered 2023-09-07: 10 mg via INTRAVENOUS

## 2023-09-07 MED ORDER — LIDOCAINE HCL 1 % IJ SOLN
INTRAMUSCULAR | Status: DC | PRN
  Administered 2023-09-07: 50 mg via INTRADERMAL

## 2023-09-07 MED ORDER — PROPOFOL 10 MG/ML IV BOLUS
INTRAVENOUS | Status: DC | PRN
  Administered 2023-09-07 (×2): 50 mg via INTRAVENOUS
  Administered 2023-09-07: 20 mg via INTRAVENOUS
  Administered 2023-09-07: 50 mg via INTRAVENOUS

## 2023-09-07 NOTE — Anesthesia Preprocedure Evaluation (Addendum)
Anesthesia Evaluation  Patient identified by MRN, date of birth, ID band Patient awake    Reviewed: Allergy & Precautions, H&P , NPO status , Patient's Chart, lab work & pertinent test results, reviewed documented beta blocker date and time   Airway Mallampati: II  TM Distance: >3 FB Neck ROM: Full    Dental no notable dental hx. (+) Dental Advisory Given, Missing,    Pulmonary neg pulmonary ROS   Pulmonary exam normal breath sounds clear to auscultation       Cardiovascular Exercise Tolerance: Good hypertension, Pt. on medications  Rhythm:Regular Rate:Normal     Neuro/Psych  PSYCHIATRIC DISORDERS Anxiety Depression    CVA negative neurological ROS  negative psych ROS   GI/Hepatic negative GI ROS,GERD  ,,(+)     substance abuse  , Hepatitis -, CPoly substance abuse   Endo/Other  negative endocrine ROS    Renal/GU Renal InsufficiencyRenal disease  negative genitourinary   Musculoskeletal  (+) Arthritis , Osteoarthritis,    Abdominal   Peds  Hematology negative hematology ROS (+)   Anesthesia Other Findings   Reproductive/Obstetrics negative OB ROS                             Anesthesia Physical Anesthesia Plan  ASA: 3 and emergent  Anesthesia Plan: General   Post-op Pain Management: Minimal or no pain anticipated   Induction: Intravenous  PONV Risk Score and Plan: Propofol infusion  Airway Management Planned: Nasal Cannula and Natural Airway  Additional Equipment: None  Intra-op Plan:   Post-operative Plan:   Informed Consent: I have reviewed the patients History and Physical, chart, labs and discussed the procedure including the risks, benefits and alternatives for the proposed anesthesia with the patient or authorized representative who has indicated his/her understanding and acceptance.     Dental advisory given  Plan Discussed with: CRNA  Anesthesia Plan  Comments:         Anesthesia Quick Evaluation

## 2023-09-07 NOTE — Op Note (Addendum)
[facility] Patient Name: Colleen Nunez Procedure Date: 09/07/2023 12:24 PM MRN: [id number] Date of Birth: 1956-03-05 Attending MD: Hennie Duos. Marletta Lor , [place], [id number] CSN: [id number] Age: 67 Admit Type: Outpatient Procedure:                Upper GI endoscopy Indications:              Dysphagia Providers:                Hennie Duos. Marletta Lor, DO, Angelica Ran, Dyann Ruddle Referring MD:              Medicines:                See the Anesthesia note for documentation of the                            administered medications Complications:            No immediate complications. Estimated Blood Loss:     Estimated blood loss was minimal. Procedure:                Pre-Anesthesia Assessment:                           - The anesthesia plan was to use monitored                            anesthesia care (MAC).                           After obtaining informed consent, the endoscope was                            passed under direct vision. Throughout the                            procedure, the patient's blood pressure, pulse, and                            oxygen saturations were monitored continuously. The                            [id number]-H190 ([id number]) scope was introduced through the                            mouth, and advanced to the second part of duodenum.                            The upper GI endoscopy was accomplished without                            difficulty. The patient tolerated the procedure                            well. Scope In: 1:11:47 PM Scope Out: 1:17:39 PM Total Procedure Duration: 0 hours 5 minutes 52 seconds  Findings:      The Z-line was regular and was found 39 cm from  the incisors.      There is no endoscopic evidence of stenosis or stricture in the entire       esophagus.      Patchy mild inflammation characterized by erosions and erythema was       found in the gastric body and in the gastric antrum. Biopsies were taken       with a cold forceps  for Helicobacter pylori testing.      Localized mild inflammation characterized by congestion (edema) and       erythema was found in the duodenal bulb and in the first portion of the       duodenum. Mucosa polypoid, ?peptic duodenitis. Biopsies were taken with       a cold forceps for histology.      A small hiatal hernia was present. Impression:               - Z-line regular, 39 cm from the incisors.                           - Gastritis. Biopsied.                           - Duodenitis. Biopsied.                           - Small hiatal hernia. Moderate Sedation:      Per Anesthesia Care Recommendation:           - Patient has a contact number available for                            emergencies. The signs and symptoms of potential                            delayed complications were discussed with the                            patient. Return to normal activities tomorrow.                            Written discharge instructions were provided to the                            patient.                           - Resume previous diet.                           - Continue present medications.                           - Await pathology results.                           - Return to GI clinic in 3 months. Procedure Code(s):        --- Professional ---  81191, Esophagogastroduodenoscopy, flexible,                            transoral; with biopsy, single or multiple Diagnosis Code(s):        --- Professional ---                           K29.70, Gastritis, unspecified, without bleeding                           K29.80, Duodenitis without bleeding                           R13.10, Dysphagia, unspecified CPT copyright 2022 American Medical Association. All rights reserved. The codes documented in this report are preliminary and upon coder review may  be revised to meet current compliance requirements. Hennie Duos. Marletta Lor, DO Hennie Duos. Marletta Lor, DO 09/07/2023  1:20:28 PM This report has been signed electronically. Number of Addenda: 0

## 2023-09-07 NOTE — Discharge Instructions (Addendum)
EGD Discharge instructions Please read the instructions outlined below and refer to this sheet in the next few weeks. These discharge instructions provide you with general information on caring for yourself after you leave the hospital. Your doctor may also give you specific instructions. While your treatment has been planned according to the most current medical practices available, unavoidable complications occasionally occur. If you have any problems or questions after discharge, please call your doctor. ACTIVITY You may resume your regular activity but move at a slower pace for the next 24 hours.  Take frequent rest periods for the next 24 hours.  Walking will help expel (get rid of) the air and reduce the bloated feeling in your abdomen.  No driving for 24 hours (because of the anesthesia (medicine) used during the test).  You may shower.  Do not sign any important legal documents or operate any machinery for 24 hours (because of the anesthesia used during the test).  NUTRITION Drink plenty of fluids.  You may resume your normal diet.  Begin with a light meal and progress to your normal diet.  Avoid alcoholic beverages for 24 hours or as instructed by your caregiver.  MEDICATIONS You may resume your normal medications unless your caregiver tells you otherwise.  WHAT YOU CAN EXPECT TODAY You may experience abdominal discomfort such as a feeling of fullness or "gas" pains.  FOLLOW-UP Your doctor will discuss the results of your test with you.  SEEK IMMEDIATE MEDICAL ATTENTION IF ANY OF THE FOLLOWING OCCUR: Excessive nausea (feeling sick to your stomach) and/or vomiting.  Severe abdominal pain and distention (swelling).  Trouble swallowing.  Temperature over 101 F (37.8 C).  Rectal bleeding or vomiting of blood.      Colonoscopy Discharge Instructions  Read the instructions outlined below and refer to this sheet in the next few weeks. These discharge instructions provide you  with general information on caring for yourself after you leave the hospital. Your doctor may also give you specific instructions. While your treatment has been planned according to the most current medical practices available, unavoidable complications occasionally occur.   ACTIVITY You may resume your regular activity, but move at a slower pace for the next 24 hours.  Take frequent rest periods for the next 24 hours.  Walking will help get rid of the air and reduce the bloated feeling in your belly (abdomen).  No driving for 24 hours (because of the medicine (anesthesia) used during the test).   Do not sign any important legal documents or operate any machinery for 24 hours (because of the anesthesia used during the test).  NUTRITION Drink plenty of fluids.  You may resume your normal diet as instructed by your doctor.  Begin with a light meal and progress to your normal diet. Heavy or fried foods are harder to digest and may make you feel sick to your stomach (nauseated).  Avoid alcoholic beverages for 24 hours or as instructed.  MEDICATIONS You may resume your normal medications unless your doctor tells you otherwise.  WHAT YOU CAN EXPECT TODAY Some feelings of bloating in the abdomen.  Passage of more gas than usual.  Spotting of blood in your stool or on the toilet paper.  IF YOU HAD POLYPS REMOVED DURING THE COLONOSCOPY: No aspirin products for 7 days or as instructed.  No alcohol for 7 days or as instructed.  Eat a soft diet for the next 24 hours.  FINDING OUT THE RESULTS OF YOUR TEST Not all test results  are available during your visit. If your test results are not back during the visit, make an appointment with your caregiver to find out the results. Do not assume everything is normal if you have not heard from your caregiver or the medical facility. It is important for you to follow up on all of your test results.  SEEK IMMEDIATE MEDICAL ATTENTION IF: You have more than a  spotting of blood in your stool.  Your belly is swollen (abdominal distention).  You are nauseated or vomiting.  You have a temperature over 101.  You have abdominal pain or discomfort that is severe or gets worse throughout the day.   Your EGD revealed mild amount inflammation in your stomach and small bowel.  I took biopsies of this to rule out infection with a bacteria called H. pylori.    You also have a small hiatal hernia. Your esophagus was wide open, I did not need to stretch it today.   Continue on Omeprazole 40 mg daily.   Your colonoscopy revealed 2 polyp(s) which I removed successfully. Await pathology results, my office will contact you. I recommend repeating colonoscopy in 5 years for surveillance purposes.   You also have diverticulosis and internal hemorrhoids. I would recommend increasing fiber in your diet or adding OTC Benefiber/Metamucil. Be sure to drink at least 4 to 6 glasses of water daily. Follow-up with GI in 2-3 months   I hope you have a great rest of your week!  Hennie Duos. Marletta Lor, D.O. Gastroenterology and Hepatology [facility]

## 2023-09-07 NOTE — H&P (Signed)
Primary Care Physician:  Benetta Spar, MD Primary Gastroenterologist:  Dr. Marletta Lor  Pre-Procedure History & Physical: HPI:  BRIELE Nunez is a 67 y.o. female is here for an EGD with possible dilation due to history of dysphagia and colonoscopy for rectal bleeding  Past Medical History:  Diagnosis Date   Anemia    Anxiety    Arthritis    Chronic hip pain    Chronic knee pain    Depression    h/o suicide attempts in the past   GERD (gastroesophageal reflux disease)    Gout    Hepatitis C antibody test positive        Hypertension    Polysubstance abuse (HCC)    h/o   Recurrent falls    Renal insufficiency    Stroke [facility])     Past Surgical History:  Procedure Laterality Date   ABDOMINAL HYSTERECTOMY     ANTERIOR CERVICAL CORPECTOMY N/A 06/09/2013   Procedure: C4 C5 Corpectomy with C6-7 Anterior cervical fusion/Peek cage/Trestle plate;  Surgeon: Clydene Fake, MD;  Location: [facility] NEURO ORS;  Service: Neurosurgery;  Laterality: N/A;  Cervical Four, Cervical Five Corpectomy and Cervical six-seven Anterior cervical fusion/Peek cage Three-Five /Trestle Plating Cervical Three to Cervical Seven   ANTERIOR CERVICAL DECOMP/DISCECTOMY FUSION N/A 08/12/2013   Procedure: Repair of Anterior  Cervical CSF LEAK, lumbar drain placement.;  Surgeon: Karn Cassis, MD;  Location: [facility] OR;  Service: Neurosurgery;  Laterality: N/A;   bilateral SOO and appendectomy  11/17/2004   cartilage removal     left knee   COLONOSCOPY N/A 07/04/2015   Procedure: COLONOSCOPY;  Surgeon: Malissa Hippo, MD;  Location: [facility] END[facility] SU[facility];  Service: Endoscopy;  Laterality: N/A;  100   KNEE ARTHROSCOPY     PARTIAL KNEE ARTHROPLASTY Right    TOTAL KNEE ARTHROPLASTY Left    X-STOP IMPLANTATION      Prior to Admission medications   Medication Sig Start Date End Date Taking? Authorizing Provider  alendronate (FOSAMAX) 70 MG tablet Take 70 mg by mouth once a week. 07/22/23  Yes [provider]   allopurinol (ZYLOPRIM) 100 MG tablet Take 100 mg by mouth daily.   Yes [provider]  amLODipine (NORVASC) 10 MG tablet Take 0.5 tablets (5 mg total) by mouth daily. 09/02/13  Yes Love, Evlyn Kanner, PA-C  cholecalciferol (VITAMIN D3) 25 MCG (1000 UNIT) tablet Take 2,000 Units by mouth daily.   Yes [provider]  ferrous sulfate 324 MG TBEC Take 324 mg by mouth daily with breakfast.   Yes [provider]  gabapentin (NEURONTIN) 300 MG capsule Take 300 mg by mouth 2 (two) times daily as needed (for pain).   Yes [provider]  lidocaine (LIDODERM) 5 %  11/26/22  Yes [provider]  oxyCODONE-acetaminophen (PERCOCET/ROXICET) 5-325 MG tablet SMARTSIG:1 Tablet(s) By Mouth Every 12 Hours 05/04/23  Yes [provider]  tiZANidine (ZANAFLEX) 4 MG tablet Take 4 mg by mouth every 8 (eight) hours. 05/28/23  Yes [provider]  omeprazole (PRILOSEC) 40 MG capsule Take 1 capsule (40 mg total) by mouth daily before breakfast. Open capsule and dispense contents into applesauce to take. 07/27/23   Letta Median, PA-C    Allergies as of 08/04/2023 - Review Complete 07/27/2023  Allergen Reaction Noted   Aspirin  12/03/2007   Oxycodone Nausea And Vomiting 06/30/2015    Family History  Problem Relation Age of Onset   Diabetes Sister    Diabetes Mother  deceased age 77   Kidney failure Mother    Alcohol abuse Father    Colon cancer Neg Hx     Social History   Socioeconomic History   Marital status: Single    Spouse name: Not on file   Number of children: 2   Years of education: Not on file   Highest education level: Not on file  Occupational History   Not on file  Tobacco Use   Smoking status: Never    Passive exposure: Yes   Smokeless tobacco: Never  Vaping Use   Vaping status: Never Used  Substance and Sexual Activity   Alcohol use: Yes    Alcohol/week: 3.0 standard drinks of alcohol    Types: 3 Cans of beer per week     Comment: occasionally   Drug use: Not Currently    Comment: h/o crack cocaine in past. Clean since 2006   Sexual activity: Yes    Birth control/protection: Surgical  Other Topics Concern   Not on file  Social History Narrative   Not on file   Social Determinants of Health   Financial Resource Strain: Not on file  Food Insecurity: Not on file  Transportation Needs: Not on file  Physical Activity: Not on file  Stress: Not on file  Social Connections: Not on file  Intimate Partner Violence: Not on file    Review of Systems: General: Negative for fever, chills, fatigue, weakness. Eyes: Negative for vision changes.  ENT: Negative for hoarseness, difficulty swallowing , nasal congestion. CV: Negative for chest pain, angina, palpitations, dyspnea on exertion, peripheral edema.  Respiratory: Negative for dyspnea at rest, dyspnea on exertion, cough, sputum, wheezing.  GI: See history of present illness. GU:  Negative for dysuria, hematuria, urinary incontinence, urinary frequency, nocturnal urination.  MS: Negative for joint pain, low back pain.  Derm: Negative for rash or itching.  Neuro: Negative for weakness, abnormal sensation, seizure, frequent headaches, memory loss, confusion.  Psych: Negative for anxiety, depression Endo: Negative for unusual weight change.  Heme: Negative for bruising or bleeding. Allergy: Negative for rash or hives.  Physical Exam: Vital signs in last 24 hours: Temp:  [97.7 F (36.5 C)] 97.7 F (36.5 C) (10/21 1154) Pulse Rate:  [90] 90 (10/21 1154) Resp:  [15] 15 (10/21 1154) BP: (142)/(92) 142/92 (10/21 1154) SpO2:  [99 %] 99 % (10/21 1154) Weight:  [75 kg] 75 kg (10/21 1154)   General:   Alert,  Well-developed, well-nourished, pleasant and cooperative in NAD Head:  Normocephalic and atraumatic. Eyes:  Sclera clear, no icterus.   Conjunctiva pink. Ears:  Normal auditory acuity. Nose:  No deformity, discharge,  or lesions. Msk:  Symmetrical  without gross deformities. Normal posture. Extremities:  Without clubbing or edema. Neurologic:  Alert and  oriented x4;  grossly normal neurologically. Skin:  Intact without significant lesions or rashes. Psych:  Alert and cooperative. Normal mood and affect.   Impression/Plan: Colleen Nunez is here for an EGD with possible dilation due to history of dysphagia and colonoscopy for rectal bleeding  Risks, benefits, limitations, imponderables and alternatives regarding procedure have been reviewed with the patient. Questions have been answered. All parties agreeable.

## 2023-09-07 NOTE — Anesthesia Postprocedure Evaluation (Signed)
Anesthesia Post Note  Patient: Colleen Nunez  Procedure(s) Performed: COLONOSCOPY WITH PROPOFOL ESOPHAGOGASTRODUODENOSCOPY (EGD) WITH PROPOFOL BIOPSY POLYPECTOMY  Patient location during evaluation: PACU Anesthesia Type: General Level of consciousness: awake and alert Pain management: pain level controlled Vital Signs Assessment: post-procedure vital signs reviewed and stable Respiratory status: spontaneous breathing, nonlabored ventilation, respiratory function stable and patient connected to nasal cannula oxygen Cardiovascular status: blood pressure returned to baseline and stable Postop Assessment: no apparent nausea or vomiting Anesthetic complications: no   There were no known notable events for this encounter.   Last Vitals:  Vitals:   09/07/23 1350 09/07/23 1353  BP:  108/67  Pulse: 97 97  Resp: 16 18  Temp:    SpO2: 98% 99%    Last Pain:  Vitals:   09/07/23 1353  TempSrc:   PainSc: 0-No pain                 Kayler Rise L Dawnita Molner

## 2023-09-07 NOTE — Transfer of Care (Signed)
Immediate Anesthesia Transfer of Care Note  Patient: Colleen Nunez  Procedure(s) Performed: COLONOSCOPY WITH PROPOFOL ESOPHAGOGASTRODUODENOSCOPY (EGD) WITH PROPOFOL BIOPSY POLYPECTOMY  Patient Location: Short Stay  Anesthesia Type:General  Level of Consciousness: awake  Airway & Oxygen Therapy: Patient Spontanous Breathing  Post-op Assessment: Report given to RN  Post vital signs: Reviewed and stable  Last Vitals:  Vitals Value Taken Time  BP    Temp    Pulse    Resp    SpO2      Last Pain:  Vitals:   09/07/23 1306  TempSrc:   PainSc: 9       Patients Stated Pain Goal: 6 (09/07/23 1154)  Complications: No notable events documented.

## 2023-09-07 NOTE — Op Note (Signed)
[facility] Patient Name: Colleen Nunez Procedure Date: 09/07/2023 12:19 PM MRN: [id number] Date of Birth: 19-Nov-1955 Attending MD: Hennie Duos. Marletta Lor , [place], [id number] CSN: [id number] Age: 67 Admit Type: Outpatient Procedure:                Colonoscopy Indications:              Rectal bleeding Providers:                Hennie Duos. Marletta Lor, DO, Angelica Ran, Dyann Ruddle Referring MD:              Medicines:                See the Anesthesia note for documentation of the                            administered medications Complications:            No immediate complications. Estimated Blood Loss:     Estimated blood loss was minimal. Procedure:                Pre-Anesthesia Assessment:                           - The anesthesia plan was to use monitored                            anesthesia care (MAC).                           After obtaining informed consent, the colonoscope                            was passed under direct vision. Throughout the                            procedure, the patient's blood pressure, pulse, and                            oxygen saturations were monitored continuously. The                            [id number]-HQ190L ([id number]) scope was introduced through                            the anus and advanced to the the cecum, identified                            by appendiceal orifice and ileocecal valve. The                            colonoscopy was performed without difficulty. The                            patient tolerated the procedure well. The quality                            of the bowel  preparation was evaluated using the                            BBPS [facility] Bowel Preparation Scale) with scores                            of: Right Colon = 2 (minor amount of residual                            staining, small fragments of stool and/or opaque                            liquid, but mucosa seen well), Transverse Colon = 2                             (minor amount of residual staining, small fragments                            of stool and/or opaque liquid, but mucosa seen                            well) and Left Colon = 2 (minor amount of residual                            staining, small fragments of stool and/or opaque                            liquid, but mucosa seen well). The total BBPS score                            equals 6. Fair. Scope In: 1:21:20 PM Scope Out: 1:34:27 PM Scope Withdrawal Time: 0 hours 7 minutes 56 seconds  Total Procedure Duration: 0 hours 13 minutes 7 seconds  Findings:      Non-bleeding internal hemorrhoids were found during endoscopy.      Multiple small-mouthed diverticula were found in the sigmoid colon.      Two sessile polyps were found in the ascending colon. The polyps were 4       to 5 mm in size. These polyps were removed with a cold snare. Resection       and retrieval were complete.      The exam was otherwise without abnormality. Impression:               - Non-bleeding internal hemorrhoids.                           - Diverticulosis in the sigmoid colon.                           - Two 4 to 5 mm polyps in the ascending colon,                            removed with a cold snare. Resected and retrieved.                           -  The examination was otherwise normal. Moderate Sedation:      Per Anesthesia Care Recommendation:           - Patient has a contact number available for                            emergencies. The signs and symptoms of potential                            delayed complications were discussed with the                            patient. Return to normal activities tomorrow.                            Written discharge instructions were provided to the                            patient.                           - Resume previous diet.                           - Continue present medications.                           - Await pathology results.                            - Repeat colonoscopy in 5 years for surveillance.                           - Return to GI clinic in 3 months.                           - Consider hemorrhoid banding if bleeding continues Procedure Code(s):        --- Professional ---                           [phone], Colonoscopy, flexible; with removal of                            tumor(s), polyp(s), or other lesion(s) by snare                            technique Diagnosis Code(s):        --- Professional ---                           K64.8, Other hemorrhoids                           D12.2, Benign neoplasm of ascending colon                           K62.5, Hemorrhage of anus and rectum  K57.30, Diverticulosis of large intestine without                            perforation or abscess without bleeding CPT copyright 2022 American Medical Association. All rights reserved. The codes documented in this report are preliminary and upon coder review may  be revised to meet current compliance requirements. Hennie Duos. Marletta Lor, DO Hennie Duos. Marletta Lor, DO 09/07/2023 1:36:49 PM This report has been signed electronically. Number of Addenda: 0

## 2023-09-08 LAB — SURGICAL PATHOLOGY

## 2023-09-10 ENCOUNTER — Telehealth: Payer: Self-pay

## 2023-09-10 ENCOUNTER — Encounter: Payer: Self-pay | Admitting: *Deleted

## 2023-09-10 ENCOUNTER — Other Ambulatory Visit: Payer: Self-pay | Admitting: *Deleted

## 2023-09-10 DIAGNOSIS — B1920 Unspecified viral hepatitis C without hepatic coma: Secondary | ICD-10-CM

## 2023-09-10 DIAGNOSIS — K760 Fatty (change of) liver, not elsewhere classified: Secondary | ICD-10-CM

## 2023-09-10 NOTE — Telephone Encounter (Signed)
Called pt, had already spoke to her twice this morning, states she is going to get [place] scheduled.

## 2023-09-10 NOTE — Telephone Encounter (Signed)
Pt called and lm returning call

## 2023-09-14 ENCOUNTER — Encounter (HOSPITAL_COMMUNITY): Payer: Self-pay | Admitting: Internal Medicine

## 2023-09-21 DIAGNOSIS — E039 Hypothyroidism, unspecified: Secondary | ICD-10-CM | POA: Diagnosis not present

## 2023-09-21 DIAGNOSIS — M810 Age-related osteoporosis without current pathological fracture: Secondary | ICD-10-CM | POA: Diagnosis not present

## 2023-09-21 DIAGNOSIS — M542 Cervicalgia: Secondary | ICD-10-CM | POA: Diagnosis not present

## 2023-09-21 DIAGNOSIS — N184 Chronic kidney disease, stage 4 (severe): Secondary | ICD-10-CM | POA: Diagnosis not present

## 2023-09-21 DIAGNOSIS — I10 Essential (primary) hypertension: Secondary | ICD-10-CM | POA: Diagnosis not present

## 2023-09-21 DIAGNOSIS — Z23 Encounter for immunization: Secondary | ICD-10-CM | POA: Diagnosis not present

## 2023-10-02 ENCOUNTER — Other Ambulatory Visit (HOSPITAL_COMMUNITY): Payer: Self-pay

## 2023-10-02 ENCOUNTER — Other Ambulatory Visit: Payer: Self-pay

## 2023-10-02 DIAGNOSIS — R6889 Other general symptoms and signs: Secondary | ICD-10-CM | POA: Diagnosis not present

## 2023-10-02 DIAGNOSIS — G894 Chronic pain syndrome: Secondary | ICD-10-CM | POA: Diagnosis not present

## 2023-10-02 DIAGNOSIS — Z79891 Long term (current) use of opiate analgesic: Secondary | ICD-10-CM | POA: Diagnosis not present

## 2023-10-21 ENCOUNTER — Ambulatory Visit: Payer: Medicare HMO | Admitting: Podiatry

## 2023-10-21 DIAGNOSIS — K219 Gastro-esophageal reflux disease without esophagitis: Secondary | ICD-10-CM | POA: Diagnosis not present

## 2023-10-21 DIAGNOSIS — I10 Essential (primary) hypertension: Secondary | ICD-10-CM | POA: Diagnosis not present

## 2023-10-27 ENCOUNTER — Encounter: Payer: Self-pay | Admitting: Podiatry

## 2023-10-27 ENCOUNTER — Ambulatory Visit (INDEPENDENT_AMBULATORY_CARE_PROVIDER_SITE_OTHER): Payer: Medicare HMO | Admitting: Podiatry

## 2023-10-27 DIAGNOSIS — R6889 Other general symptoms and signs: Secondary | ICD-10-CM | POA: Diagnosis not present

## 2023-10-27 DIAGNOSIS — B351 Tinea unguium: Secondary | ICD-10-CM

## 2023-10-27 DIAGNOSIS — M79675 Pain in left toe(s): Secondary | ICD-10-CM | POA: Diagnosis not present

## 2023-10-27 DIAGNOSIS — G8250 Quadriplegia, unspecified: Secondary | ICD-10-CM | POA: Diagnosis not present

## 2023-10-27 DIAGNOSIS — M79674 Pain in right toe(s): Secondary | ICD-10-CM

## 2023-10-27 NOTE — Progress Notes (Signed)
  Subjective:  Patient ID: Colleen Nunez, female    DOB: [birth year],   MRN: [id number]  Chief Complaint  Patient presents with   Nail Problem    Rfc     67 y.o. female presents for concern of thickened elongated and painful nails that are difficult to trim. Requesting to have them trimmed today. Relates burning and tingling in their feet. Hsitory of stork and polysubstance abuse as well as quadrapeligia.   PCP:  Benetta Spar, MD    . Denies any other pedal complaints. Denies n/v/f/c.   Past Medical History:  Diagnosis Date   Anemia    Anxiety    Arthritis    Chronic hip pain    Chronic knee pain    Depression    h/o suicide attempts in the past   GERD (gastroesophageal reflux disease)    Gout    Hepatitis C antibody test positive        Hypertension    Polysubstance abuse (HCC)    h/o   Recurrent falls    Renal insufficiency    Stroke (HCC)     Objective:  Physical Exam: Vascular: DP/PT pulses 2/4 bilateral. CFT <3 seconds. Absent hair growth on digits. Edema noted to bilateral lower extremities. Xerosis noted bilaterally.  Skin. No lacerations or abrasions bilateral feet. Nails 1-5 bilateral  are thickened discolored and elongated with subungual debris.  Musculoskeletal: MMT 5/5 bilateral lower extremities in DF, PF, Inversion and Eversion. Deceased ROM in DF of ankle joint.  Neurological: Sensation intact to light touch. Protective sensation diminished bilateral.    Assessment:   1. Pain due to onychomycosis of toenails of both feet   2. Quadriplegia [facility])      Plan:  Patient was evaluated and treated and all questions answered. -ABN signed.  -Discussed and educated patient on  foot care, especially with  regards to the vascular, neurological and musculoskeletal systems.  -Discussed supportive shoes at all times and checking feet regularly.  -Mechanically debrided all nails 1-5 bilateral using sterile nail nipper and filed with dremel without  incident  -Answered all patient questions -Patient to return  in 3 months for at risk foot care -Patient advised to call the office if any problems or questions arise in the meantime.   Louann Sjogren, DPM

## 2023-10-28 DIAGNOSIS — R6889 Other general symptoms and signs: Secondary | ICD-10-CM | POA: Diagnosis not present

## 2023-10-29 DIAGNOSIS — G894 Chronic pain syndrome: Secondary | ICD-10-CM | POA: Diagnosis not present

## 2023-10-29 DIAGNOSIS — Z79891 Long term (current) use of opiate analgesic: Secondary | ICD-10-CM | POA: Diagnosis not present

## 2023-10-30 DIAGNOSIS — N184 Chronic kidney disease, stage 4 (severe): Secondary | ICD-10-CM | POA: Diagnosis not present

## 2023-10-30 DIAGNOSIS — R6889 Other general symptoms and signs: Secondary | ICD-10-CM | POA: Diagnosis not present

## 2023-10-30 DIAGNOSIS — K219 Gastro-esophageal reflux disease without esophagitis: Secondary | ICD-10-CM | POA: Diagnosis not present

## 2023-10-30 DIAGNOSIS — I10 Essential (primary) hypertension: Secondary | ICD-10-CM | POA: Diagnosis not present

## 2023-10-30 DIAGNOSIS — G959 Disease of spinal cord, unspecified: Secondary | ICD-10-CM | POA: Diagnosis not present

## 2023-11-03 DIAGNOSIS — M1711 Unilateral primary osteoarthritis, right knee: Secondary | ICD-10-CM | POA: Diagnosis not present

## 2023-11-03 DIAGNOSIS — R6889 Other general symptoms and signs: Secondary | ICD-10-CM | POA: Diagnosis not present

## 2023-11-03 DIAGNOSIS — M25561 Pain in right knee: Secondary | ICD-10-CM | POA: Diagnosis not present

## 2023-11-21 DIAGNOSIS — I10 Essential (primary) hypertension: Secondary | ICD-10-CM | POA: Diagnosis not present

## 2023-11-21 DIAGNOSIS — K219 Gastro-esophageal reflux disease without esophagitis: Secondary | ICD-10-CM | POA: Diagnosis not present

## 2023-12-03 ENCOUNTER — Encounter: Payer: Self-pay | Admitting: Orthopaedic Surgery

## 2023-12-03 ENCOUNTER — Ambulatory Visit (INDEPENDENT_AMBULATORY_CARE_PROVIDER_SITE_OTHER): Payer: 59 | Admitting: Orthopaedic Surgery

## 2023-12-03 DIAGNOSIS — M7062 Trochanteric bursitis, left hip: Secondary | ICD-10-CM | POA: Diagnosis not present

## 2023-12-03 NOTE — Progress Notes (Signed)
PROCEDURE NOTE:  The patient request injection, verbal consent was obtained.  The left trochanteric area of the hip was prepped appropriately after time out was performed.   Sterile technique was observed and injection of 1 cc of DepoMedrol 40 mg with several cc's of plain xylocaine. Anesthesia was provided by ethyl chloride and a 20-gauge needle was used to inject the hip area. The injection was tolerated well.  A band aid dressing was applied.  The patient was advised to apply ice later today and tomorrow to the injection sight as needed.  Encounter Diagnosis  Name Primary?   Trochanteric bursitis of left hip Yes   I will see prn.  Call if any problem.  Precautions discussed.  Electronically Signed Darreld Mclean, MD 1/16/20259:56 AM

## 2023-12-08 DIAGNOSIS — G8929 Other chronic pain: Secondary | ICD-10-CM | POA: Diagnosis not present

## 2023-12-08 DIAGNOSIS — M25462 Effusion, left knee: Secondary | ICD-10-CM | POA: Diagnosis not present

## 2023-12-08 DIAGNOSIS — Z96652 Presence of left artificial knee joint: Secondary | ICD-10-CM | POA: Diagnosis not present

## 2023-12-08 DIAGNOSIS — M25562 Pain in left knee: Secondary | ICD-10-CM | POA: Diagnosis not present

## 2023-12-08 DIAGNOSIS — Z471 Aftercare following joint replacement surgery: Secondary | ICD-10-CM | POA: Diagnosis not present

## 2023-12-22 DIAGNOSIS — K219 Gastro-esophageal reflux disease without esophagitis: Secondary | ICD-10-CM | POA: Diagnosis not present

## 2023-12-22 DIAGNOSIS — I10 Essential (primary) hypertension: Secondary | ICD-10-CM | POA: Diagnosis not present

## 2023-12-25 DIAGNOSIS — G894 Chronic pain syndrome: Secondary | ICD-10-CM | POA: Diagnosis not present

## 2023-12-25 DIAGNOSIS — Z79891 Long term (current) use of opiate analgesic: Secondary | ICD-10-CM | POA: Diagnosis not present

## 2024-01-18 DIAGNOSIS — I10 Essential (primary) hypertension: Secondary | ICD-10-CM | POA: Diagnosis not present

## 2024-01-18 DIAGNOSIS — K219 Gastro-esophageal reflux disease without esophagitis: Secondary | ICD-10-CM | POA: Diagnosis not present

## 2024-01-22 DIAGNOSIS — M1711 Unilateral primary osteoarthritis, right knee: Secondary | ICD-10-CM | POA: Diagnosis not present

## 2024-01-22 DIAGNOSIS — M1710 Unilateral primary osteoarthritis, unspecified knee: Secondary | ICD-10-CM | POA: Diagnosis not present

## 2024-01-22 DIAGNOSIS — G894 Chronic pain syndrome: Secondary | ICD-10-CM | POA: Diagnosis not present

## 2024-01-22 DIAGNOSIS — Z79891 Long term (current) use of opiate analgesic: Secondary | ICD-10-CM | POA: Diagnosis not present

## 2024-01-26 ENCOUNTER — Ambulatory Visit (INDEPENDENT_AMBULATORY_CARE_PROVIDER_SITE_OTHER): Payer: Medicare HMO | Admitting: Podiatry

## 2024-01-26 DIAGNOSIS — Z91199 Patient's noncompliance with other medical treatment and regimen due to unspecified reason: Secondary | ICD-10-CM

## 2024-01-26 NOTE — Progress Notes (Signed)
 No show

## 2024-02-19 DIAGNOSIS — K219 Gastro-esophageal reflux disease without esophagitis: Secondary | ICD-10-CM | POA: Diagnosis not present

## 2024-02-19 DIAGNOSIS — I10 Essential (primary) hypertension: Secondary | ICD-10-CM | POA: Diagnosis not present

## 2024-02-27 DIAGNOSIS — R609 Edema, unspecified: Secondary | ICD-10-CM | POA: Diagnosis not present

## 2024-03-03 ENCOUNTER — Emergency Department (HOSPITAL_COMMUNITY)

## 2024-03-03 ENCOUNTER — Emergency Department (HOSPITAL_COMMUNITY)
Admission: EM | Admit: 2024-03-03 | Discharge: 2024-03-03 | Disposition: A | Discharge status: Home / Self Care | Attending: Emergency Medicine | Admitting: Emergency Medicine

## 2024-03-03 ENCOUNTER — Encounter (HOSPITAL_COMMUNITY): Payer: Self-pay | Admitting: *Deleted

## 2024-03-03 ENCOUNTER — Other Ambulatory Visit: Payer: Self-pay

## 2024-03-03 DIAGNOSIS — D649 Anemia, unspecified: Secondary | ICD-10-CM | POA: Diagnosis not present

## 2024-03-03 DIAGNOSIS — M7989 Other specified soft tissue disorders: Secondary | ICD-10-CM | POA: Diagnosis not present

## 2024-03-03 DIAGNOSIS — M79672 Pain in left foot: Secondary | ICD-10-CM | POA: Insufficient documentation

## 2024-03-03 DIAGNOSIS — R600 Localized edema: Secondary | ICD-10-CM | POA: Diagnosis not present

## 2024-03-03 DIAGNOSIS — M19072 Primary osteoarthritis, left ankle and foot: Secondary | ICD-10-CM | POA: Diagnosis not present

## 2024-03-03 DIAGNOSIS — M7732 Calcaneal spur, left foot: Secondary | ICD-10-CM | POA: Diagnosis not present

## 2024-03-03 DIAGNOSIS — M79605 Pain in left leg: Secondary | ICD-10-CM | POA: Diagnosis not present

## 2024-03-03 DIAGNOSIS — I82512 Chronic embolism and thrombosis of left femoral vein: Secondary | ICD-10-CM | POA: Diagnosis not present

## 2024-03-03 DIAGNOSIS — I82539 Chronic embolism and thrombosis of unspecified popliteal vein: Secondary | ICD-10-CM | POA: Diagnosis not present

## 2024-03-03 LAB — BASIC METABOLIC PANEL WITH GFR
Anion gap: 8 (ref 5–15)
BUN: 42 mg/dL — ABNORMAL HIGH (ref 8–23)
CO2: 22 mmol/L (ref 22–32)
Calcium: 9.5 mg/dL (ref 8.9–10.3)
Chloride: 109 mmol/L (ref 98–111)
Creatinine, Ser: 2.17 mg/dL — ABNORMAL HIGH (ref 0.44–1.00)
GFR, Estimated: 24 mL/min — ABNORMAL LOW (ref >60)
Glucose, Bld: 104 mg/dL — ABNORMAL HIGH (ref 70–99)
Potassium: 4.3 mmol/L (ref 3.5–5.1)
Sodium: 139 mmol/L (ref 135–145)

## 2024-03-03 LAB — IRON AND TIBC
Iron: 40 ug/dL (ref 28–170)
Saturation Ratios: 18 % (ref 10.4–31.8)
TIBC: 223 ug/dL — ABNORMAL LOW (ref 250–450)
UIBC: 183 ug/dL

## 2024-03-03 LAB — CBC WITH DIFFERENTIAL/PLATELET
Abs Immature Granulocytes: 0.02 10*3/uL (ref 0.00–0.07)
Basophils Absolute: 0 10*3/uL (ref 0.0–0.1)
Basophils Relative: 1 %
Eosinophils Absolute: 0.2 10*3/uL (ref 0.0–0.5)
Eosinophils Relative: 4 %
HCT: 28.2 % — ABNORMAL LOW (ref 36.0–46.0)
Hemoglobin: 8.9 g/dL — ABNORMAL LOW (ref 12.0–15.0)
Immature Granulocytes: 0 %
Lymphocytes Relative: 31 %
Lymphs Abs: 1.8 10*3/uL (ref 0.7–4.0)
MCH: 28.5 pg (ref 26.0–34.0)
MCHC: 31.6 g/dL (ref 30.0–36.0)
MCV: 90.4 fL (ref 80.0–100.0)
Monocytes Absolute: 0.5 10*3/uL (ref 0.1–1.0)
Monocytes Relative: 8 %
Neutro Abs: 3.4 10*3/uL (ref 1.7–7.7)
Neutrophils Relative %: 56 %
Platelets: 227 10*3/uL (ref 150–400)
RBC: 3.12 MIL/uL — ABNORMAL LOW (ref 3.87–5.11)
RDW: 14.7 % (ref 11.5–15.5)
WBC: 6 10*3/uL (ref 4.0–10.5)
nRBC: 0 % (ref 0.0–0.2)

## 2024-03-03 LAB — RETICULOCYTES
Immature Retic Fract: 19.6 % — ABNORMAL HIGH (ref 2.3–15.9)
RBC.: 3.13 MIL/uL — ABNORMAL LOW (ref 3.87–5.11)
Retic Count, Absolute: 34.7 10*3/uL (ref 19.0–186.0)
Retic Ct Pct: 1.1 % (ref 0.4–3.1)

## 2024-03-03 LAB — VITAMIN B12: Vitamin B-12: 422 pg/mL (ref 180–914)

## 2024-03-03 LAB — URIC ACID: Uric Acid, Serum: 6.1 mg/dL (ref 2.5–7.1)

## 2024-03-03 LAB — FERRITIN: Ferritin: 80 ng/mL (ref 11–307)

## 2024-03-03 LAB — FOLATE: Folate: 7.8 ng/mL (ref >5.9)

## 2024-03-03 MED ORDER — ASPIRIN 81 MG PO CHEW: 81.0000 mg | CHEWABLE_TABLET | Freq: Every day | ORAL | 0 refills | Status: AC

## 2024-03-03 NOTE — Discharge Instructions (Signed)
 As discussed, continue to wear your compression socks daily.  Elevate your leg when possible.  Take 1 baby aspirin daily with food.  Your blood work today shows that you are anemic.  I recommend you continue to take your iron supplement daily.  Please call your primary care provider to arrange follow-up appointment and recheck of your blood counts for next week.  Return to the emergency department for any new or worsening symptoms.

## 2024-03-03 NOTE — ED Provider Notes (Signed)
 [place] EMERGENCY DEPARTMENT AT [facility] Provider Note   CSN: [id number] Arrival date & time: 03/03/24  4098     History  Chief Complaint  Patient presents with   Foot Pain    Colleen Nunez is a 68 y.o. female.   Foot Pain Pertinent negatives include no chest pain, no abdominal pain and no shortness of breath.       Colleen Nunez is a 68 y.o. female who presents to the Emergency Department complaining of left foot left lower leg pain and swelling for 1 month.  Was seen in urgent care few days ago and recommended to come to the ER for further evaluation.  Patient notes redness, excessive warmth and swelling from her lower calf into her left foot.  She has been having swelling to the dorsal foot and lower leg.  No history of DVTs or PEs.  She does state that she has gout, but current symptoms do not feel similar to previous gout flares.  She denies any numbness or tingling of her foot or lower extremity.  No known injury  Home Medications Prior to Admission medications   Medication Sig Start Date End Date Taking? Authorizing Provider  alendronate (FOSAMAX) 70 MG tablet Take 70 mg by mouth once a week. 07/22/23   [provider]  allopurinol  (ZYLOPRIM ) 100 MG tablet Take 100 mg by mouth daily.    [provider]  amLODipine  (NORVASC ) 10 MG tablet Take 0.5 tablets (5 mg total) by mouth daily. 09/02/13   Love, Renay Carota, PA-C  cholecalciferol (VITAMIN D3) 25 MCG (1000 UNIT) tablet Take 2,000 Units by mouth daily.    [provider]  ferrous sulfate 324 MG TBEC Take 324 mg by mouth daily with breakfast.    [provider]  gabapentin (NEURONTIN) 300 MG capsule Take 300 mg by mouth 2 (two) times daily as needed (for pain).    [provider]  lidocaine  (LIDODERM ) 5 %  11/26/22   [provider]  omeprazole  (PRILOSEC) 40 MG capsule Take 1 capsule (40 mg total) by mouth daily before breakfast. Open capsule and dispense  contents into applesauce to take. Patient not taking: Reported on 12/03/2023 07/27/23   Evander Hills, PA-C  oxyCODONE -acetaminophen  (PERCOCET/ROXICET) 5-325 MG tablet SMARTSIG:1 Tablet(s) By Mouth Every 12 Hours Patient not taking: Reported on 12/03/2023 05/04/23   [provider]  tiZANidine (ZANAFLEX) 4 MG tablet Take 4 mg by mouth every 8 (eight) hours. 05/28/23   [provider]      Allergies    Aspirin  and Oxycodone     Review of Systems   Review of Systems  Constitutional:  Negative for appetite change, chills and fever.  Respiratory:  Negative for shortness of breath.   Cardiovascular:  Negative for chest pain.  Gastrointestinal:  Negative for abdominal pain, nausea and vomiting.  Musculoskeletal:  Positive for myalgias (Swelling, pain left lower leg and left foot).  Skin:  Positive for color change (Erythema of the left lower leg and dorsal foot). Negative for wound.  Neurological:  Negative for weakness and numbness.    Physical Exam Updated Vital Signs BP (!) 143/82 (BP Location: Left Arm)   Pulse 82   Temp 97.7 F (36.5 C) (Oral)   Resp 18   Ht 5\' 2"  (1.575 m)   Wt 71.2 kg   SpO2 100%   BMI 28.72 kg/m  Physical Exam Vitals and nursing note reviewed.  Constitutional:      General:  She is not in acute distress.    Appearance: Normal appearance. She is not ill-appearing or toxic-appearing.  Cardiovascular:     Rate and Rhythm: Normal rate and regular rhythm.     Pulses: Normal pulses.  Pulmonary:     Effort: Pulmonary effort is normal.  Musculoskeletal:        General: Tenderness present. No deformity or signs of injury.     Comments: Mild tenderness to palp of the dorsal left foot/  Skin:    General: Skin is warm.     Capillary Refill: Capillary refill takes less than 2 seconds.     Findings: Erythema present.     Comments: Mild erythema distal left lower leg and dorsal left foot.  No open wounds of the foot or lower leg.  No lymphangitis,  no weeping or open sores to the webspaces of the toes.  Neurological:     General: No focal deficit present.     Mental Status: She is alert.     Sensory: No sensory deficit.     Motor: No weakness.     ED Results / Procedures / Treatments   Labs (all labs ordered are listed, but only abnormal results are displayed) Labs Reviewed  CBC WITH DIFFERENTIAL/PLATELET - Abnormal; Notable for the following components:      Result Value   RBC 3.12 (*)    Hemoglobin 8.9 (*)    HCT 28.2 (*)    All other components within normal limits  BASIC METABOLIC PANEL WITH GFR - Abnormal; Notable for the following components:   Glucose, Bld 104 (*)    BUN 42 (*)    Creatinine, Ser 2.17 (*)    GFR, Estimated 24 (*)    All other components within normal limits  URIC ACID  VITAMIN B12  FOLATE  IRON AND TIBC  FERRITIN  RETICULOCYTES    EKG None  Radiology US  Venous Img Lower Unilateral Left Result Date: 03/03/2024 CLINICAL DATA:  Intermittent left lower extremity pain and swelling EXAM: LEFT LOWER EXTREMITY VENOUS DOPPLER ULTRASOUND TECHNIQUE: Gray-scale sonography with graded compression, as well as color Doppler and duplex ultrasound were performed to evaluate the lower extremity deep venous systems from the level of the common femoral vein and including the common femoral, femoral, profunda femoral, popliteal and calf veins including the posterior tibial, peroneal and gastrocnemius veins when visible. The superficial great saphenous vein was also interrogated. Spectral Doppler was utilized to evaluate flow at rest and with distal augmentation maneuvers in the common femoral, femoral and popliteal veins. COMPARISON:  None Available. FINDINGS: Contralateral Common Femoral Vein: Respiratory phasicity is normal and symmetric with the symptomatic side. No evidence of thrombus. Normal compressibility. Common Femoral Vein: Echogenic eccentric wall thickening. Restored flow is present on color Doppler  imaging. Findings suggest sequelae of chronic DVT. Saphenofemoral Junction: No evidence of thrombus. Normal compressibility and flow on color Doppler imaging. Profunda Femoral Vein: Trace eccentric wall thickening extends into the profunda femoral vein. Femoral Vein: Eccentric wall thickening extends through the femoral vein. The vein remains patent. Popliteal Vein: Sequelae of chronic DVT present within the popliteal vein. No occlusion. Calf Veins: Not well seen. Superficial Great Saphenous Vein: No evidence of thrombus. Normal compressibility. Venous Reflux:  None. Other Findings:  None. IMPRESSION: Sequelae of predominantly recanalized chronic DVT present within the left common femoral vein, profunda femoral vein, femoral vein and popliteal veins. No evidence of acute or occlusive DVT. Electronically Signed   By: Forrest Iha.D.  On: 03/03/2024 13:53   DG Foot Complete Left Result Date: 03/03/2024 CLINICAL DATA:  pain.  Swelling EXAM: LEFT FOOT - COMPLETE 3+ VIEW COMPARISON:  None Available. FINDINGS: No acute fracture or dislocation. No aggressive osseous lesion. There are curvilinear calcifications along the lateral aspect of the cuboid, favored to represent os peroneum. Mild diffuse arthritis of imaged joints with asymmetric moderate involvement of first metatarsophalangeal joint. Calcaneal spur noted along the Achilles tendon and Plantar aponeurosis attachment sites. Mild diffuse soft tissue swelling over the dorsum of the forefoot and midfoot. However, no focal soft tissue defect or air within the soft tissue. No focal bone erosions to suggest osteomyelitis. No radiopaque foreign bodies. IMPRESSION: *No acute osseous abnormality of the left foot. *Mild diffuse soft tissue swelling over the dorsum of the forefoot and midfoot. However, no focal soft tissue defect or air within the soft tissue. No focal bone erosions to suggest osteomyelitis. Electronically Signed   By: Beula Brunswick M.D.   On:  03/03/2024 13:06    Procedures Procedures    Medications Ordered in ED Medications - No data to display  ED Course/ Medical Decision Making/ A&P                                 Medical Decision Making Patient here with left lower leg swelling pain and swelling to her left foot.  Symptoms for 1 month.  History of gout but states current symptoms do not feel similar to previous gout flares.  Concerned that she may have a blood clot or infection to her foot.  Denies any known injury or open wound to her leg or foot.  On exam, there is some mild edema and erythema noted to the distal lower leg and dorsal foot.  I do not appreciate any open wounds to the webspaces of the toes, lower legs or plantar surface of the foot.  Neurovascularly intact.  He has good cap refill of the extremity.  Dorsalis pedis pulses very palpable bilaterally.  Gout flare, cellulitis, DVT all considered.  Peripheral edema also considered but given unilateral symptoms with warmth and erythema, this is considered less likely.  Suspicious for cellulitis  Amount and/or Complexity of Data Reviewed Labs: ordered.    Details: Labs show renal insuffiencey that's near baseline.  Hgb 8.9 lower than baseline. Rectal exam negative hemoccult Radiology: ordered.    Details: US  of the extremity is negative for DVT.    XR of the foot w/o acute bony finding Discussion of management or test interpretation with external provider(s):   Discussed findings with vascular surgery, Dr. Susi Eric who personally reviewed ultrasound imaging.  Recommends patient start 81 mg aspirin , compression stockings.  No indication of acute occlusive DVT. Pt does have good PCP f/u  Pt has compression stocking at home. agrees to wear. Asymptomatic anemia, with hx of same,doubt acute GI bleed incidental finding.  Anemia panel pending, no indication for blood transfusion.  Will have her  closely f/u with PCP  strict return precautions given  Risk OTC  drugs.           Final Clinical Impression(s) / ED Diagnoses Final diagnoses:  Edema of left lower leg  Anemia, unspecified type    Rx / DC Orders ED Discharge Orders     None         Catherne Clubs, PA-C 03/05/24 6045    Cheyenne Cotta, MD 03/08/24 1049

## 2024-03-03 NOTE — ED Notes (Addendum)
 Patient discharged. Provider spoke to patient. Paperwork given to patient and reviewed. Pt verbalized understanding. VSS. A+Ox4. Patient wheeled to car. No iv in place.

## 2024-03-03 NOTE — ED Triage Notes (Signed)
 Pt with left foot swelling x one month. Seen at UC few days ago per pt and recommended to go to ED.

## 2024-03-03 NOTE — ED Notes (Signed)
 Assisted x1 ambulating to bathroom.

## 2024-03-17 DIAGNOSIS — G894 Chronic pain syndrome: Secondary | ICD-10-CM | POA: Diagnosis not present

## 2024-03-17 DIAGNOSIS — Z79891 Long term (current) use of opiate analgesic: Secondary | ICD-10-CM | POA: Diagnosis not present

## 2024-03-17 DIAGNOSIS — M109 Gout, unspecified: Secondary | ICD-10-CM | POA: Diagnosis not present

## 2024-03-17 DIAGNOSIS — M542 Cervicalgia: Secondary | ICD-10-CM | POA: Diagnosis not present

## 2024-03-17 DIAGNOSIS — I10 Essential (primary) hypertension: Secondary | ICD-10-CM | POA: Diagnosis not present

## 2024-03-24 DIAGNOSIS — Z0001 Encounter for general adult medical examination with abnormal findings: Secondary | ICD-10-CM | POA: Diagnosis not present

## 2024-03-24 DIAGNOSIS — M109 Gout, unspecified: Secondary | ICD-10-CM | POA: Diagnosis not present

## 2024-03-24 DIAGNOSIS — E039 Hypothyroidism, unspecified: Secondary | ICD-10-CM | POA: Diagnosis not present

## 2024-03-24 DIAGNOSIS — I10 Essential (primary) hypertension: Secondary | ICD-10-CM | POA: Diagnosis not present

## 2024-03-24 DIAGNOSIS — Z1389 Encounter for screening for other disorder: Secondary | ICD-10-CM | POA: Diagnosis not present

## 2024-03-24 DIAGNOSIS — M810 Age-related osteoporosis without current pathological fracture: Secondary | ICD-10-CM | POA: Diagnosis not present

## 2024-03-24 DIAGNOSIS — K219 Gastro-esophageal reflux disease without esophagitis: Secondary | ICD-10-CM | POA: Diagnosis not present

## 2024-03-24 DIAGNOSIS — N184 Chronic kidney disease, stage 4 (severe): Secondary | ICD-10-CM | POA: Diagnosis not present

## 2024-03-24 DIAGNOSIS — M542 Cervicalgia: Secondary | ICD-10-CM | POA: Diagnosis not present

## 2024-03-24 DIAGNOSIS — G959 Disease of spinal cord, unspecified: Secondary | ICD-10-CM | POA: Diagnosis not present

## 2024-04-14 DIAGNOSIS — Z79891 Long term (current) use of opiate analgesic: Secondary | ICD-10-CM | POA: Diagnosis not present

## 2024-04-14 DIAGNOSIS — M109 Gout, unspecified: Secondary | ICD-10-CM | POA: Diagnosis not present

## 2024-04-14 DIAGNOSIS — I10 Essential (primary) hypertension: Secondary | ICD-10-CM | POA: Diagnosis not present

## 2024-04-14 DIAGNOSIS — G894 Chronic pain syndrome: Secondary | ICD-10-CM | POA: Diagnosis not present

## 2024-04-14 DIAGNOSIS — K219 Gastro-esophageal reflux disease without esophagitis: Secondary | ICD-10-CM | POA: Diagnosis not present

## 2024-04-14 DIAGNOSIS — N184 Chronic kidney disease, stage 4 (severe): Secondary | ICD-10-CM | POA: Diagnosis not present

## 2024-05-16 ENCOUNTER — Telehealth: Payer: Self-pay

## 2024-05-16 NOTE — Telephone Encounter (Signed)
 Patient left vm message stating that she was wanting to set up an appointment with Dr. Brenna. I returned her call and had to leave a message telling her to call the office.

## 2024-05-24 DIAGNOSIS — I10 Essential (primary) hypertension: Secondary | ICD-10-CM | POA: Diagnosis not present

## 2024-05-24 DIAGNOSIS — K219 Gastro-esophageal reflux disease without esophagitis: Secondary | ICD-10-CM | POA: Diagnosis not present

## 2024-05-26 DIAGNOSIS — M109 Gout, unspecified: Secondary | ICD-10-CM | POA: Diagnosis not present

## 2024-05-26 DIAGNOSIS — Z79891 Long term (current) use of opiate analgesic: Secondary | ICD-10-CM | POA: Diagnosis not present

## 2024-05-26 DIAGNOSIS — G894 Chronic pain syndrome: Secondary | ICD-10-CM | POA: Diagnosis not present

## 2024-05-26 DIAGNOSIS — M542 Cervicalgia: Secondary | ICD-10-CM | POA: Diagnosis not present

## 2024-05-26 DIAGNOSIS — I10 Essential (primary) hypertension: Secondary | ICD-10-CM | POA: Diagnosis not present

## 2024-06-23 DIAGNOSIS — G894 Chronic pain syndrome: Secondary | ICD-10-CM | POA: Diagnosis not present

## 2024-06-23 DIAGNOSIS — Z79891 Long term (current) use of opiate analgesic: Secondary | ICD-10-CM | POA: Diagnosis not present

## 2024-06-23 DIAGNOSIS — I10 Essential (primary) hypertension: Secondary | ICD-10-CM | POA: Diagnosis not present

## 2024-06-23 DIAGNOSIS — M109 Gout, unspecified: Secondary | ICD-10-CM | POA: Diagnosis not present

## 2024-06-23 DIAGNOSIS — K219 Gastro-esophageal reflux disease without esophagitis: Secondary | ICD-10-CM | POA: Diagnosis not present

## 2024-06-23 DIAGNOSIS — N184 Chronic kidney disease, stage 4 (severe): Secondary | ICD-10-CM | POA: Diagnosis not present

## 2024-07-24 DIAGNOSIS — K219 Gastro-esophageal reflux disease without esophagitis: Secondary | ICD-10-CM | POA: Diagnosis not present

## 2024-07-24 DIAGNOSIS — I10 Essential (primary) hypertension: Secondary | ICD-10-CM | POA: Diagnosis not present

## 2024-08-04 DIAGNOSIS — M65972 Unspecified synovitis and tenosynovitis, left ankle and foot: Secondary | ICD-10-CM | POA: Diagnosis not present

## 2024-08-04 DIAGNOSIS — M25572 Pain in left ankle and joints of left foot: Secondary | ICD-10-CM | POA: Diagnosis not present

## 2024-08-04 DIAGNOSIS — M25372 Other instability, left ankle: Secondary | ICD-10-CM | POA: Diagnosis not present

## 2024-08-11 DIAGNOSIS — G8929 Other chronic pain: Secondary | ICD-10-CM | POA: Diagnosis not present

## 2024-08-11 DIAGNOSIS — M109 Gout, unspecified: Secondary | ICD-10-CM | POA: Diagnosis not present

## 2024-08-11 DIAGNOSIS — M542 Cervicalgia: Secondary | ICD-10-CM | POA: Diagnosis not present

## 2024-08-11 DIAGNOSIS — M792 Neuralgia and neuritis, unspecified: Secondary | ICD-10-CM | POA: Diagnosis not present

## 2024-08-11 DIAGNOSIS — G603 Idiopathic progressive neuropathy: Secondary | ICD-10-CM | POA: Diagnosis not present

## 2024-08-11 DIAGNOSIS — I10 Essential (primary) hypertension: Secondary | ICD-10-CM | POA: Diagnosis not present

## 2024-08-23 DIAGNOSIS — K219 Gastro-esophageal reflux disease without esophagitis: Secondary | ICD-10-CM | POA: Diagnosis not present

## 2024-08-23 DIAGNOSIS — I10 Essential (primary) hypertension: Secondary | ICD-10-CM | POA: Diagnosis not present

## 2024-09-08 DIAGNOSIS — M109 Gout, unspecified: Secondary | ICD-10-CM | POA: Diagnosis not present

## 2024-09-08 DIAGNOSIS — I10 Essential (primary) hypertension: Secondary | ICD-10-CM | POA: Diagnosis not present

## 2024-09-08 DIAGNOSIS — K219 Gastro-esophageal reflux disease without esophagitis: Secondary | ICD-10-CM | POA: Diagnosis not present

## 2024-11-25 ENCOUNTER — Other Ambulatory Visit (HOSPITAL_COMMUNITY): Payer: Self-pay | Admitting: Nephrology

## 2024-11-25 DIAGNOSIS — E876 Hypokalemia: Secondary | ICD-10-CM

## 2024-11-25 DIAGNOSIS — D649 Anemia, unspecified: Secondary | ICD-10-CM

## 2024-11-25 DIAGNOSIS — N184 Chronic kidney disease, stage 4 (severe): Secondary | ICD-10-CM

## 2024-12-09 ENCOUNTER — Ambulatory Visit (HOSPITAL_COMMUNITY)
Admission: RE | Admit: 2024-12-09 | Discharge: 2024-12-09 | Disposition: A | Discharge status: Home / Self Care | Source: Ambulatory Visit | Attending: Nephrology | Admitting: Nephrology

## 2024-12-09 ENCOUNTER — Other Ambulatory Visit (HOSPITAL_COMMUNITY)
Admission: RE | Admit: 2024-12-09 | Discharge: 2024-12-09 | Disposition: A | Source: Ambulatory Visit | Attending: Nephrology | Admitting: Nephrology

## 2024-12-09 DIAGNOSIS — N184 Chronic kidney disease, stage 4 (severe): Secondary | ICD-10-CM | POA: Insufficient documentation

## 2024-12-09 DIAGNOSIS — Z79899 Other long term (current) drug therapy: Secondary | ICD-10-CM | POA: Diagnosis not present

## 2024-12-09 DIAGNOSIS — I1310 Hypertensive heart and chronic kidney disease without heart failure, with stage 1 through stage 4 chronic kidney disease, or unspecified chronic kidney disease: Secondary | ICD-10-CM | POA: Diagnosis not present

## 2024-12-09 DIAGNOSIS — E663 Overweight: Secondary | ICD-10-CM | POA: Diagnosis not present

## 2024-12-09 DIAGNOSIS — D631 Anemia in chronic kidney disease: Secondary | ICD-10-CM | POA: Insufficient documentation

## 2024-12-09 DIAGNOSIS — E876 Hypokalemia: Secondary | ICD-10-CM | POA: Diagnosis present

## 2024-12-09 DIAGNOSIS — D649 Anemia, unspecified: Secondary | ICD-10-CM | POA: Insufficient documentation

## 2024-12-09 LAB — URINALYSIS, W/ REFLEX TO CULTURE (INFECTION SUSPECTED)
Bilirubin Urine: NEGATIVE
Glucose, UA: NEGATIVE mg/dL
Ketones, ur: NEGATIVE mg/dL
Nitrite: NEGATIVE
Protein, ur: 30 mg/dL — AB
Specific Gravity, Urine: 1.01 (ref 1.005–1.030)
pH: 6 (ref 5.0–8.0)

## 2024-12-09 LAB — FERRITIN: Ferritin: 140 ng/mL (ref 11–307)

## 2024-12-09 LAB — IRON AND TIBC
Iron: 50 ug/dL (ref 28–170)
Saturation Ratios: 20 % (ref 10.4–31.8)
TIBC: 252 ug/dL (ref 250–450)
UIBC: 202 ug/dL

## 2024-12-09 LAB — CBC
HCT: 31.6 % — ABNORMAL LOW (ref 36.0–46.0)
Hemoglobin: 10 g/dL — ABNORMAL LOW (ref 12.0–15.0)
MCH: 29 pg (ref 26.0–34.0)
MCHC: 31.6 g/dL (ref 30.0–36.0)
MCV: 91.6 fL (ref 80.0–100.0)
Platelets: 168 K/uL (ref 150–400)
RBC: 3.45 MIL/uL — ABNORMAL LOW (ref 3.87–5.11)
RDW: 14.5 % (ref 11.5–15.5)
WBC: 5.8 K/uL (ref 4.0–10.5)
nRBC: 0 % (ref 0.0–0.2)

## 2024-12-09 LAB — RENAL FUNCTION PANEL
Albumin: 4.4 g/dL (ref 3.5–5.0)
Anion gap: 11 (ref 5–15)
BUN: 43 mg/dL — ABNORMAL HIGH (ref 8–23)
CO2: 26 mmol/L (ref 22–32)
Calcium: 9.7 mg/dL (ref 8.9–10.3)
Chloride: 102 mmol/L (ref 98–111)
Creatinine, Ser: 2.53 mg/dL — ABNORMAL HIGH (ref 0.44–1.00)
GFR, Estimated: 20 mL/min — ABNORMAL LOW
Glucose, Bld: 89 mg/dL (ref 70–99)
Phosphorus: 3.2 mg/dL (ref 2.5–4.6)
Potassium: 4.4 mmol/L (ref 3.5–5.1)
Sodium: 139 mmol/L (ref 135–145)

## 2024-12-09 LAB — FOLATE: Folate: 9.2 ng/mL

## 2024-12-09 LAB — VITAMIN D 25 HYDROXY (VIT D DEFICIENCY, FRACTURES): Vit D, 25-Hydroxy: 55.6 ng/mL (ref 30–100)

## 2024-12-09 LAB — VITAMIN B12: Vitamin B-12: 695 pg/mL (ref 180–914)

## 2024-12-09 LAB — HEPATITIS B SURFACE ANTIGEN: Hepatitis B Surface Ag: NONREACTIVE

## 2024-12-09 LAB — MAGNESIUM: Magnesium: 2 mg/dL (ref 1.7–2.4)

## 2024-12-09 LAB — URIC ACID: Uric Acid, Serum: 5.6 mg/dL (ref 2.5–7.1)

## 2024-12-09 LAB — HEPATITIS C ANTIBODY: HCV Ab: REACTIVE — AB

## 2024-12-10 LAB — HEMOGLOBIN A1C
Hgb A1c MFr Bld: 5.5 % (ref 4.8–5.6)
Mean Plasma Glucose: 111 mg/dL

## 2024-12-10 LAB — C4 COMPLEMENT: Complement C4, Body Fluid: 29 mg/dL (ref 12–38)

## 2024-12-10 LAB — HIV-1/HIV-2 QUAL RNA
HIV-1 RNA, Qualitative: NONREACTIVE
HIV-2 RNA, Qualitative: NONREACTIVE

## 2024-12-10 LAB — C3 COMPLEMENT: C3 Complement: 112 mg/dL (ref 82–167)

## 2024-12-10 LAB — HEPATITIS B SURFACE ANTIBODY, QUANTITATIVE: Hep B S AB Quant (Post): 35.6 m[IU]/mL

## 2024-12-10 LAB — PARATHYROID HORMONE, INTACT (NO CA): PTH: 115 pg/mL — ABNORMAL HIGH (ref 15–65)

## 2024-12-10 LAB — ANA: Anti Nuclear Antibody (ANA): NEGATIVE

## 2024-12-11 LAB — HCV RT-PCR, QUANT (NON-GRAPH): Hepatitis C Quantitation: NOT DETECTED [IU]/mL

## 2024-12-11 LAB — HCV AB W REFLEX TO QUANT PCR: HCV Ab: REACTIVE — AB

## 2024-12-12 LAB — URINE CULTURE: Culture: >=100000 — AB

## 2024-12-13 LAB — MISC LABCORP TEST (SEND OUT): Labcorp test code: 141330

## 2024-12-14 LAB — IMMUNOFIXATION ELECTROPHORESIS
IgA: 174 mg/dL (ref 87–352)
IgG (Immunoglobin G), Serum: 1689 mg/dL — ABNORMAL HIGH (ref 586–1602)
IgM (Immunoglobulin M), Srm: 29 mg/dL (ref 26–217)
Total Protein ELP: 7 g/dL (ref 6.0–8.5)

## 2024-12-14 LAB — PROTEIN ELECTROPHORESIS, SERUM
A/G Ratio: 1.1 (ref 0.7–1.7)
Albumin ELP: 3.7 g/dL (ref 2.9–4.4)
Alpha-1-Globulin: 0.2 g/dL (ref 0.0–0.4)
Alpha-2-Globulin: 0.8 g/dL (ref 0.4–1.0)
Beta Globulin: 0.9 g/dL (ref 0.7–1.3)
Gamma Globulin: 1.6 g/dL (ref 0.4–1.8)
Globulin, Total: 3.4 g/dL (ref 2.2–3.9)
Total Protein ELP: 7.1 g/dL (ref 6.0–8.5)

## 2024-12-14 LAB — IMMUNOFIXATION, URINE
